# Patient Record
Sex: Female | Born: 1965 | Race: White | Hispanic: No | Marital: Single | Smoking: Former smoker
Health system: Southern US, Community
[De-identification: ages and names within clinical notes are randomized; demographics above are authoritative.]

## PROBLEM LIST (undated history)

## (undated) DIAGNOSIS — K219 Gastro-esophageal reflux disease without esophagitis: Secondary | ICD-10-CM

## (undated) DIAGNOSIS — G894 Chronic pain syndrome: Secondary | ICD-10-CM

## (undated) DIAGNOSIS — F419 Anxiety disorder, unspecified: Secondary | ICD-10-CM

## (undated) DIAGNOSIS — R1013 Epigastric pain: Secondary | ICD-10-CM

## (undated) DIAGNOSIS — F329 Major depressive disorder, single episode, unspecified: Secondary | ICD-10-CM

## (undated) DIAGNOSIS — I10 Essential (primary) hypertension: Secondary | ICD-10-CM

## (undated) DIAGNOSIS — J449 Chronic obstructive pulmonary disease, unspecified: Secondary | ICD-10-CM

## (undated) DIAGNOSIS — IMO0001 Reserved for inherently not codable concepts without codable children: Secondary | ICD-10-CM

## (undated) DIAGNOSIS — R5383 Other fatigue: Secondary | ICD-10-CM

## (undated) DIAGNOSIS — M797 Fibromyalgia: Secondary | ICD-10-CM

## (undated) DIAGNOSIS — F32A Depression, unspecified: Secondary | ICD-10-CM

## (undated) DIAGNOSIS — G4730 Sleep apnea, unspecified: Secondary | ICD-10-CM

## (undated) HISTORY — PX: ENDOMETRIAL ABLATION: SHX621

## (undated) HISTORY — DX: Other fatigue: R53.83

## (undated) HISTORY — PX: CHOLECYSTECTOMY: SHX55

## (undated) HISTORY — PX: CARPAL TUNNEL RELEASE: SHX101

## (undated) HISTORY — DX: Epigastric pain: R10.13

## (undated) HISTORY — PX: SPINE SURGERY: SHX786

## (undated) HISTORY — PX: KNEE SURGERY: SHX244

## (undated) HISTORY — PX: OTHER SURGICAL HISTORY: SHX169

---

## 1998-04-20 ENCOUNTER — Other Ambulatory Visit: Admission: RE | Admit: 1998-04-20 | Discharge: 1998-04-20 | Payer: Self-pay | Admitting: Obstetrics and Gynecology

## 1999-05-10 ENCOUNTER — Other Ambulatory Visit: Admission: RE | Admit: 1999-05-10 | Discharge: 1999-05-10 | Payer: Self-pay | Admitting: Obstetrics and Gynecology

## 2001-11-02 ENCOUNTER — Inpatient Hospital Stay (HOSPITAL_COMMUNITY): Admission: EM | Admit: 2001-11-02 | Discharge: 2001-11-05 | Payer: Self-pay | Admitting: Emergency Medicine

## 2001-11-02 ENCOUNTER — Encounter: Payer: Self-pay | Admitting: Emergency Medicine

## 2001-11-03 ENCOUNTER — Encounter: Payer: Self-pay | Admitting: Internal Medicine

## 2003-01-27 ENCOUNTER — Emergency Department (HOSPITAL_COMMUNITY): Admission: EM | Admit: 2003-01-27 | Discharge: 2003-01-27 | Payer: Self-pay | Admitting: Emergency Medicine

## 2003-03-10 ENCOUNTER — Emergency Department (HOSPITAL_COMMUNITY): Admission: EM | Admit: 2003-03-10 | Discharge: 2003-03-10 | Payer: Self-pay | Admitting: Emergency Medicine

## 2003-03-10 ENCOUNTER — Encounter: Payer: Self-pay | Admitting: Emergency Medicine

## 2003-03-19 ENCOUNTER — Observation Stay (HOSPITAL_COMMUNITY): Admission: AD | Admit: 2003-03-19 | Discharge: 2003-03-20 | Payer: Self-pay | Admitting: Obstetrics and Gynecology

## 2003-05-16 ENCOUNTER — Observation Stay (HOSPITAL_COMMUNITY): Admission: AD | Admit: 2003-05-16 | Discharge: 2003-05-17 | Payer: Self-pay | Admitting: Obstetrics & Gynecology

## 2003-05-17 ENCOUNTER — Ambulatory Visit (HOSPITAL_COMMUNITY): Admission: AD | Admit: 2003-05-17 | Discharge: 2003-05-17 | Payer: Self-pay | Admitting: Obstetrics & Gynecology

## 2003-05-26 ENCOUNTER — Observation Stay (HOSPITAL_COMMUNITY): Admission: RE | Admit: 2003-05-26 | Discharge: 2003-05-26 | Payer: Self-pay | Admitting: Obstetrics & Gynecology

## 2003-08-03 ENCOUNTER — Inpatient Hospital Stay (HOSPITAL_COMMUNITY): Admission: AD | Admit: 2003-08-03 | Discharge: 2003-08-04 | Payer: Self-pay | Admitting: Obstetrics and Gynecology

## 2003-11-17 ENCOUNTER — Inpatient Hospital Stay (HOSPITAL_COMMUNITY): Admission: RE | Admit: 2003-11-17 | Discharge: 2003-11-18 | Payer: Self-pay | Admitting: General Surgery

## 2006-04-21 ENCOUNTER — Emergency Department (HOSPITAL_COMMUNITY): Admission: EM | Admit: 2006-04-21 | Discharge: 2006-04-21 | Payer: Self-pay | Admitting: Emergency Medicine

## 2006-07-16 ENCOUNTER — Emergency Department (HOSPITAL_COMMUNITY): Admission: EM | Admit: 2006-07-16 | Discharge: 2006-07-16 | Payer: Self-pay | Admitting: Emergency Medicine

## 2007-04-09 ENCOUNTER — Emergency Department (HOSPITAL_COMMUNITY): Admission: EM | Admit: 2007-04-09 | Discharge: 2007-04-09 | Payer: Self-pay | Admitting: Emergency Medicine

## 2007-04-10 ENCOUNTER — Other Ambulatory Visit: Admission: RE | Admit: 2007-04-10 | Discharge: 2007-04-10 | Payer: Self-pay | Admitting: Obstetrics and Gynecology

## 2007-04-10 ENCOUNTER — Emergency Department (HOSPITAL_COMMUNITY): Admission: EM | Admit: 2007-04-10 | Discharge: 2007-04-10 | Payer: Self-pay | Admitting: Emergency Medicine

## 2007-05-19 ENCOUNTER — Ambulatory Visit (HOSPITAL_COMMUNITY): Admission: RE | Admit: 2007-05-19 | Discharge: 2007-05-19 | Payer: Self-pay | Admitting: Obstetrics & Gynecology

## 2007-05-25 ENCOUNTER — Encounter: Admission: RE | Admit: 2007-05-25 | Discharge: 2007-05-25 | Payer: Self-pay | Admitting: Obstetrics & Gynecology

## 2007-05-25 ENCOUNTER — Ambulatory Visit (HOSPITAL_COMMUNITY): Admission: RE | Admit: 2007-05-25 | Discharge: 2007-05-25 | Payer: Self-pay | Admitting: Obstetrics & Gynecology

## 2007-06-05 ENCOUNTER — Inpatient Hospital Stay (HOSPITAL_COMMUNITY): Admission: AD | Admit: 2007-06-05 | Discharge: 2007-06-05 | Payer: Self-pay | Admitting: Obstetrics & Gynecology

## 2007-06-08 ENCOUNTER — Ambulatory Visit (HOSPITAL_COMMUNITY): Admission: RE | Admit: 2007-06-08 | Discharge: 2007-06-08 | Payer: Self-pay | Admitting: Obstetrics & Gynecology

## 2007-06-16 ENCOUNTER — Ambulatory Visit (HOSPITAL_COMMUNITY): Admission: RE | Admit: 2007-06-16 | Discharge: 2007-06-16 | Payer: Self-pay | Admitting: Obstetrics & Gynecology

## 2007-06-27 ENCOUNTER — Emergency Department (HOSPITAL_COMMUNITY): Admission: EM | Admit: 2007-06-27 | Discharge: 2007-06-27 | Payer: Self-pay | Admitting: Emergency Medicine

## 2007-06-29 ENCOUNTER — Emergency Department (HOSPITAL_COMMUNITY): Admission: EM | Admit: 2007-06-29 | Discharge: 2007-06-29 | Payer: Self-pay | Admitting: Emergency Medicine

## 2007-07-01 ENCOUNTER — Ambulatory Visit (HOSPITAL_COMMUNITY): Admission: RE | Admit: 2007-07-01 | Discharge: 2007-07-01 | Payer: Self-pay | Admitting: Obstetrics & Gynecology

## 2007-07-21 ENCOUNTER — Inpatient Hospital Stay (HOSPITAL_COMMUNITY): Admission: AD | Admit: 2007-07-21 | Discharge: 2007-07-21 | Payer: Self-pay | Admitting: Family Medicine

## 2007-07-21 ENCOUNTER — Ambulatory Visit: Payer: Self-pay | Admitting: *Deleted

## 2007-07-22 ENCOUNTER — Ambulatory Visit: Payer: Self-pay | Admitting: Cardiology

## 2007-07-27 ENCOUNTER — Ambulatory Visit: Payer: Self-pay | Admitting: Family Medicine

## 2007-07-29 ENCOUNTER — Ambulatory Visit: Payer: Self-pay

## 2007-07-29 ENCOUNTER — Encounter: Payer: Self-pay | Admitting: Cardiology

## 2007-07-29 ENCOUNTER — Ambulatory Visit (HOSPITAL_COMMUNITY): Admission: RE | Admit: 2007-07-29 | Discharge: 2007-07-29 | Payer: Self-pay | Admitting: Obstetrics & Gynecology

## 2007-08-03 ENCOUNTER — Ambulatory Visit: Payer: Self-pay | Admitting: Obstetrics & Gynecology

## 2007-08-17 ENCOUNTER — Ambulatory Visit: Payer: Self-pay | Admitting: *Deleted

## 2007-08-26 ENCOUNTER — Ambulatory Visit (HOSPITAL_COMMUNITY): Admission: RE | Admit: 2007-08-26 | Discharge: 2007-08-26 | Payer: Self-pay | Admitting: Obstetrics & Gynecology

## 2007-08-31 ENCOUNTER — Ambulatory Visit: Payer: Self-pay | Admitting: Obstetrics & Gynecology

## 2007-09-03 ENCOUNTER — Ambulatory Visit: Payer: Self-pay | Admitting: *Deleted

## 2007-09-03 ENCOUNTER — Inpatient Hospital Stay (HOSPITAL_COMMUNITY): Admission: AD | Admit: 2007-09-03 | Discharge: 2007-09-03 | Payer: Self-pay | Admitting: Obstetrics & Gynecology

## 2007-09-07 ENCOUNTER — Ambulatory Visit: Payer: Self-pay | Admitting: Obstetrics & Gynecology

## 2007-09-07 ENCOUNTER — Ambulatory Visit (HOSPITAL_COMMUNITY): Admission: RE | Admit: 2007-09-07 | Discharge: 2007-09-07 | Payer: Self-pay | Admitting: Obstetrics & Gynecology

## 2007-09-21 ENCOUNTER — Ambulatory Visit: Payer: Self-pay | Admitting: Family Medicine

## 2007-09-23 ENCOUNTER — Ambulatory Visit (HOSPITAL_COMMUNITY): Admission: RE | Admit: 2007-09-23 | Discharge: 2007-09-23 | Payer: Self-pay | Admitting: Obstetrics & Gynecology

## 2007-09-23 ENCOUNTER — Ambulatory Visit: Payer: Self-pay | Admitting: Obstetrics & Gynecology

## 2007-09-28 ENCOUNTER — Encounter: Admission: RE | Admit: 2007-09-28 | Discharge: 2007-11-09 | Payer: Self-pay | Admitting: Obstetrics & Gynecology

## 2007-09-28 ENCOUNTER — Ambulatory Visit: Payer: Self-pay | Admitting: Obstetrics & Gynecology

## 2007-10-05 ENCOUNTER — Ambulatory Visit: Payer: Self-pay | Admitting: Obstetrics & Gynecology

## 2007-10-12 ENCOUNTER — Ambulatory Visit: Payer: Self-pay | Admitting: Obstetrics & Gynecology

## 2007-10-15 ENCOUNTER — Ambulatory Visit: Payer: Self-pay | Admitting: Obstetrics & Gynecology

## 2007-10-19 ENCOUNTER — Ambulatory Visit: Payer: Self-pay | Admitting: Obstetrics & Gynecology

## 2007-10-21 ENCOUNTER — Ambulatory Visit (HOSPITAL_COMMUNITY): Admission: RE | Admit: 2007-10-21 | Discharge: 2007-10-21 | Payer: Self-pay | Admitting: Obstetrics & Gynecology

## 2007-10-26 ENCOUNTER — Ambulatory Visit: Payer: Self-pay | Admitting: Obstetrics & Gynecology

## 2007-10-29 ENCOUNTER — Ambulatory Visit: Payer: Self-pay | Admitting: Family Medicine

## 2007-11-02 ENCOUNTER — Inpatient Hospital Stay (HOSPITAL_COMMUNITY): Admission: AD | Admit: 2007-11-02 | Discharge: 2007-11-02 | Payer: Self-pay | Admitting: Obstetrics & Gynecology

## 2007-11-02 ENCOUNTER — Ambulatory Visit: Payer: Self-pay | Admitting: Obstetrics & Gynecology

## 2007-11-05 ENCOUNTER — Ambulatory Visit: Payer: Self-pay | Admitting: Obstetrics & Gynecology

## 2007-11-09 ENCOUNTER — Ambulatory Visit: Payer: Self-pay | Admitting: Obstetrics & Gynecology

## 2007-11-10 ENCOUNTER — Inpatient Hospital Stay (HOSPITAL_COMMUNITY): Admission: AD | Admit: 2007-11-10 | Discharge: 2007-11-14 | Payer: Self-pay | Admitting: Gynecology

## 2007-11-10 ENCOUNTER — Ambulatory Visit: Payer: Self-pay | Admitting: Gynecology

## 2008-01-27 ENCOUNTER — Ambulatory Visit (HOSPITAL_COMMUNITY): Admission: RE | Admit: 2008-01-27 | Discharge: 2008-01-27 | Payer: Self-pay | Admitting: Family Medicine

## 2008-02-01 ENCOUNTER — Ambulatory Visit: Payer: Self-pay | Admitting: Obstetrics and Gynecology

## 2008-05-01 ENCOUNTER — Emergency Department (HOSPITAL_COMMUNITY): Admission: EM | Admit: 2008-05-01 | Discharge: 2008-05-02 | Payer: Self-pay | Admitting: Emergency Medicine

## 2008-05-03 ENCOUNTER — Inpatient Hospital Stay (HOSPITAL_COMMUNITY): Admission: EM | Admit: 2008-05-03 | Discharge: 2008-05-05 | Payer: Self-pay | Admitting: Emergency Medicine

## 2008-06-16 ENCOUNTER — Other Ambulatory Visit: Admission: RE | Admit: 2008-06-16 | Discharge: 2008-06-16 | Payer: Self-pay | Admitting: Obstetrics and Gynecology

## 2008-07-03 ENCOUNTER — Emergency Department (HOSPITAL_COMMUNITY): Admission: EM | Admit: 2008-07-03 | Discharge: 2008-07-03 | Payer: Self-pay | Admitting: Emergency Medicine

## 2008-08-03 ENCOUNTER — Ambulatory Visit (HOSPITAL_COMMUNITY): Admission: RE | Admit: 2008-08-03 | Discharge: 2008-08-03 | Payer: Self-pay | Admitting: Obstetrics and Gynecology

## 2008-10-10 ENCOUNTER — Ambulatory Visit (HOSPITAL_COMMUNITY): Admission: RE | Admit: 2008-10-10 | Discharge: 2008-10-10 | Payer: Self-pay | Admitting: Obstetrics and Gynecology

## 2008-10-25 ENCOUNTER — Emergency Department (HOSPITAL_COMMUNITY): Admission: EM | Admit: 2008-10-25 | Discharge: 2008-10-25 | Payer: Self-pay | Admitting: Emergency Medicine

## 2009-03-02 ENCOUNTER — Ambulatory Visit (HOSPITAL_COMMUNITY): Admission: RE | Admit: 2009-03-02 | Discharge: 2009-03-02 | Payer: Self-pay | Admitting: Obstetrics & Gynecology

## 2009-11-17 ENCOUNTER — Emergency Department (HOSPITAL_COMMUNITY): Admission: EM | Admit: 2009-11-17 | Discharge: 2009-11-17 | Payer: Self-pay | Admitting: Emergency Medicine

## 2009-11-20 ENCOUNTER — Emergency Department (HOSPITAL_COMMUNITY): Admission: EM | Admit: 2009-11-20 | Discharge: 2009-11-20 | Payer: Self-pay | Admitting: Emergency Medicine

## 2009-11-29 ENCOUNTER — Emergency Department (HOSPITAL_COMMUNITY): Admission: EM | Admit: 2009-11-29 | Discharge: 2009-11-29 | Payer: Self-pay | Admitting: Emergency Medicine

## 2009-12-13 ENCOUNTER — Encounter (HOSPITAL_COMMUNITY): Admission: RE | Admit: 2009-12-13 | Discharge: 2010-01-12 | Payer: Self-pay | Admitting: Family Medicine

## 2010-01-01 ENCOUNTER — Ambulatory Visit (HOSPITAL_COMMUNITY): Admission: RE | Admit: 2010-01-01 | Discharge: 2010-01-01 | Payer: Self-pay | Admitting: Neurosurgery

## 2010-01-30 ENCOUNTER — Encounter (HOSPITAL_COMMUNITY): Admission: RE | Admit: 2010-01-30 | Discharge: 2010-03-01 | Payer: Self-pay | Admitting: Family Medicine

## 2010-03-13 ENCOUNTER — Emergency Department (HOSPITAL_COMMUNITY): Admission: EM | Admit: 2010-03-13 | Discharge: 2010-03-13 | Payer: Self-pay | Admitting: Emergency Medicine

## 2010-03-26 ENCOUNTER — Emergency Department (HOSPITAL_COMMUNITY): Admission: EM | Admit: 2010-03-26 | Discharge: 2010-03-26 | Payer: Self-pay | Admitting: Emergency Medicine

## 2010-08-30 ENCOUNTER — Emergency Department (HOSPITAL_COMMUNITY)
Admission: EM | Admit: 2010-08-30 | Discharge: 2010-08-30 | Disposition: A | Discharge status: Home / Self Care | Payer: Medicaid Other | Attending: Emergency Medicine | Admitting: Emergency Medicine

## 2010-08-30 ENCOUNTER — Emergency Department (HOSPITAL_COMMUNITY): Payer: Medicaid Other

## 2010-08-30 DIAGNOSIS — R5383 Other fatigue: Secondary | ICD-10-CM | POA: Insufficient documentation

## 2010-08-30 DIAGNOSIS — M549 Dorsalgia, unspecified: Secondary | ICD-10-CM | POA: Insufficient documentation

## 2010-08-30 DIAGNOSIS — Z79899 Other long term (current) drug therapy: Secondary | ICD-10-CM | POA: Insufficient documentation

## 2010-08-30 DIAGNOSIS — R5381 Other malaise: Secondary | ICD-10-CM | POA: Insufficient documentation

## 2010-08-30 DIAGNOSIS — G8929 Other chronic pain: Secondary | ICD-10-CM | POA: Insufficient documentation

## 2010-08-30 DIAGNOSIS — J45901 Unspecified asthma with (acute) exacerbation: Secondary | ICD-10-CM | POA: Insufficient documentation

## 2010-08-30 DIAGNOSIS — R0602 Shortness of breath: Secondary | ICD-10-CM | POA: Insufficient documentation

## 2010-08-30 LAB — URINALYSIS, ROUTINE W REFLEX MICROSCOPIC
Bilirubin Urine: NEGATIVE
Hgb urine dipstick: NEGATIVE
Ketones, ur: NEGATIVE mg/dL
Protein, ur: NEGATIVE mg/dL
Urine Glucose, Fasting: NEGATIVE mg/dL
Urobilinogen, UA: 0.2 mg/dL (ref 0.0–1.0)

## 2010-08-30 LAB — COMPREHENSIVE METABOLIC PANEL
ALT: 21 U/L (ref 0–35)
AST: 23 U/L (ref 0–37)
Albumin: 4.1 g/dL (ref 3.5–5.2)
Alkaline Phosphatase: 71 U/L (ref 39–117)
Chloride: 105 mEq/L (ref 96–112)
Potassium: 3.7 mEq/L (ref 3.5–5.1)
Sodium: 136 mEq/L (ref 135–145)
Total Bilirubin: 0.8 mg/dL (ref 0.3–1.2)
Total Protein: 7.6 g/dL (ref 6.0–8.3)

## 2010-08-30 LAB — CBC
MCV: 96.6 fL (ref 78.0–100.0)
Platelets: 301 10*3/uL (ref 150–400)
RBC: 4.45 MIL/uL (ref 3.87–5.11)
RDW: 12.8 % (ref 11.5–15.5)
WBC: 8.4 10*3/uL (ref 4.0–10.5)

## 2010-08-30 LAB — DIFFERENTIAL
Basophils Absolute: 0.1 10*3/uL (ref 0.0–0.1)
Eosinophils Absolute: 0.3 10*3/uL (ref 0.0–0.7)
Eosinophils Relative: 3 % (ref 0–5)
Lymphocytes Relative: 27 % (ref 12–46)
Lymphs Abs: 2.3 10*3/uL (ref 0.7–4.0)
Neutrophils Relative %: 62 % (ref 43–77)

## 2010-08-30 LAB — GLUCOSE, CAPILLARY

## 2010-10-24 LAB — CBC
HCT: 40.9 % (ref 36.0–46.0)
MCHC: 34.9 g/dL (ref 30.0–36.0)
MCV: 97.6 fL (ref 78.0–100.0)
Platelets: 287 10*3/uL (ref 150–400)
WBC: 16.7 10*3/uL — ABNORMAL HIGH (ref 4.0–10.5)

## 2010-10-24 LAB — BASIC METABOLIC PANEL
BUN: 12 mg/dL (ref 6–23)
CO2: 23 mEq/L (ref 19–32)
Chloride: 111 mEq/L (ref 96–112)
Creatinine, Ser: 0.8 mg/dL (ref 0.4–1.2)
Glucose, Bld: 103 mg/dL — ABNORMAL HIGH (ref 70–99)
Potassium: 3.4 mEq/L — ABNORMAL LOW (ref 3.5–5.1)

## 2010-10-24 LAB — ETHANOL: Alcohol, Ethyl (B): 270 mg/dL — ABNORMAL HIGH (ref 0–10)

## 2010-10-24 LAB — D-DIMER, QUANTITATIVE: D-Dimer, Quant: 1.39 ug/mL-FEU — ABNORMAL HIGH (ref 0.00–0.48)

## 2010-10-24 LAB — DIFFERENTIAL
Basophils Relative: 0 % (ref 0–1)
Eosinophils Absolute: 0.7 10*3/uL (ref 0.0–0.7)
Eosinophils Relative: 4 % (ref 0–5)
Lymphs Abs: 3.1 10*3/uL (ref 0.7–4.0)
Monocytes Relative: 5 % (ref 3–12)

## 2010-10-24 LAB — URINALYSIS, ROUTINE W REFLEX MICROSCOPIC
Glucose, UA: NEGATIVE mg/dL
Leukocytes, UA: NEGATIVE
Nitrite: NEGATIVE
Specific Gravity, Urine: 1.02 (ref 1.005–1.030)
pH: 5.5 (ref 5.0–8.0)

## 2010-10-24 LAB — POCT CARDIAC MARKERS: Troponin i, poc: <0.05 ng/mL (ref 0.00–0.09)

## 2010-10-24 LAB — URINE MICROSCOPIC-ADD ON

## 2010-10-25 LAB — CBC
HCT: 42.5 % (ref 36.0–46.0)
Hemoglobin: 14.7 g/dL (ref 12.0–15.0)
MCHC: 34.7 g/dL (ref 30.0–36.0)
MCV: 97.7 fL (ref 78.0–100.0)
RBC: 4.35 MIL/uL (ref 3.87–5.11)

## 2010-10-25 LAB — BASIC METABOLIC PANEL
CO2: 25 mEq/L (ref 19–32)
Chloride: 105 mEq/L (ref 96–112)
GFR calc Af Amer: >60 mL/min (ref >60)
Potassium: 3.5 mEq/L (ref 3.5–5.1)
Sodium: 137 mEq/L (ref 135–145)

## 2010-10-30 ENCOUNTER — Emergency Department (HOSPITAL_COMMUNITY): Payer: Medicaid Other

## 2010-10-30 ENCOUNTER — Inpatient Hospital Stay (HOSPITAL_COMMUNITY)
Admission: EM | Admit: 2010-10-30 | Discharge: 2010-11-05 | DRG: 190 | Disposition: A | Discharge status: Home / Self Care | Payer: Medicaid Other | Attending: Internal Medicine | Admitting: Internal Medicine

## 2010-10-30 DIAGNOSIS — K5900 Constipation, unspecified: Secondary | ICD-10-CM | POA: Diagnosis present

## 2010-10-30 DIAGNOSIS — R1310 Dysphagia, unspecified: Secondary | ICD-10-CM | POA: Diagnosis present

## 2010-10-30 DIAGNOSIS — T380X5A Adverse effect of glucocorticoids and synthetic analogues, initial encounter: Secondary | ICD-10-CM | POA: Diagnosis present

## 2010-10-30 DIAGNOSIS — F411 Generalized anxiety disorder: Secondary | ICD-10-CM | POA: Diagnosis present

## 2010-10-30 DIAGNOSIS — G894 Chronic pain syndrome: Secondary | ICD-10-CM | POA: Diagnosis present

## 2010-10-30 DIAGNOSIS — J209 Acute bronchitis, unspecified: Principal | ICD-10-CM | POA: Diagnosis present

## 2010-10-30 DIAGNOSIS — D72829 Elevated white blood cell count, unspecified: Secondary | ICD-10-CM | POA: Diagnosis not present

## 2010-10-30 DIAGNOSIS — R7309 Other abnormal glucose: Secondary | ICD-10-CM | POA: Diagnosis present

## 2010-10-30 DIAGNOSIS — J440 Chronic obstructive pulmonary disease with acute lower respiratory infection: Principal | ICD-10-CM | POA: Diagnosis present

## 2010-10-30 DIAGNOSIS — K2960 Other gastritis without bleeding: Secondary | ICD-10-CM | POA: Diagnosis present

## 2010-10-30 DIAGNOSIS — K2100 Gastro-esophageal reflux disease with esophagitis, without bleeding: Secondary | ICD-10-CM | POA: Diagnosis present

## 2010-10-30 DIAGNOSIS — Q391 Atresia of esophagus with tracheo-esophageal fistula: Secondary | ICD-10-CM

## 2010-10-30 DIAGNOSIS — I10 Essential (primary) hypertension: Secondary | ICD-10-CM | POA: Diagnosis present

## 2010-10-30 DIAGNOSIS — F17200 Nicotine dependence, unspecified, uncomplicated: Secondary | ICD-10-CM | POA: Diagnosis present

## 2010-10-30 LAB — POCT I-STAT, CHEM 8
BUN: 4 mg/dL — ABNORMAL LOW (ref 6–23)
Calcium, Ion: 1.05 mmol/L — ABNORMAL LOW (ref 1.12–1.32)
Creatinine, Ser: 0.7 mg/dL (ref 0.4–1.2)
Sodium: 137 mEq/L (ref 135–145)
TCO2: 22 mmol/L (ref 0–100)

## 2010-10-30 LAB — DIFFERENTIAL
Basophils Relative: 0 % (ref 0–1)
Monocytes Absolute: 0.2 10*3/uL (ref 0.1–1.0)
Monocytes Relative: 2 % — ABNORMAL LOW (ref 3–12)
Neutro Abs: 8 10*3/uL — ABNORMAL HIGH (ref 1.7–7.7)

## 2010-10-30 LAB — CBC
Hemoglobin: 13.3 g/dL (ref 12.0–15.0)
MCH: 32.7 pg (ref 26.0–34.0)
MCHC: 32.9 g/dL (ref 30.0–36.0)

## 2010-10-31 LAB — CBC
MCH: 32.4 pg (ref 26.0–34.0)
MCHC: 32.5 g/dL (ref 30.0–36.0)
MCV: 99.7 fL (ref 78.0–100.0)
Platelets: 219 10*3/uL (ref 150–400)
RDW: 13 % (ref 11.5–15.5)

## 2010-10-31 LAB — INFLUENZA PANEL BY PCR (TYPE A & B): Influenza B By PCR: NEGATIVE

## 2010-10-31 LAB — DIFFERENTIAL
Eosinophils Absolute: 0 10*3/uL (ref 0.0–0.7)
Eosinophils Relative: 0 % (ref 0–5)
Lymphs Abs: 0.4 10*3/uL — ABNORMAL LOW (ref 0.7–4.0)
Monocytes Absolute: 0.1 10*3/uL (ref 0.1–1.0)
Monocytes Relative: 1 % — ABNORMAL LOW (ref 3–12)

## 2010-10-31 LAB — BASIC METABOLIC PANEL
BUN: 4 mg/dL — ABNORMAL LOW (ref 6–23)
CO2: 23 mEq/L (ref 19–32)
Chloride: 106 mEq/L (ref 96–112)
Creatinine, Ser: 0.59 mg/dL (ref 0.4–1.2)
Potassium: 3.5 mEq/L (ref 3.5–5.1)

## 2010-10-31 LAB — GLUCOSE, CAPILLARY
Glucose-Capillary: 143 mg/dL — ABNORMAL HIGH (ref 70–99)
Glucose-Capillary: 172 mg/dL — ABNORMAL HIGH (ref 70–99)

## 2010-10-31 LAB — MAGNESIUM: Magnesium: 2 mg/dL (ref 1.5–2.5)

## 2010-11-01 LAB — GLUCOSE, CAPILLARY
Glucose-Capillary: 143 mg/dL — ABNORMAL HIGH (ref 70–99)
Glucose-Capillary: 161 mg/dL — ABNORMAL HIGH (ref 70–99)

## 2010-11-01 NOTE — H&P (Signed)
[name], [name] NO.:  [id number]  MEDICAL RECORD NO.:  [id number]           PATIENT TYPE:  E  LOCATION:  [facility]                          FACILITY:  [facility]  PHYSICIAN:  [name], M.D. DATE OF BIRTH:  1966-01-07  DATE OF ADMISSION:  10/30/2010 DATE OF DISCHARGE:  LH                             HISTORY & PHYSICAL   PRIMARY CARE PHYSICIAN:  Christine Rear. Fusco, MD  CHIEF COMPLAINT:  Progressive shortness of breath for 1 week.  HISTORY OF PRESENT ILLNESS:  This is a 45 year old lady with a history of asthma with a moderate frequency of attacks through the year, usually controlled with home albuterol via nebulizer, however, for the past week she has been having runny nose, cough, and a postnasal drip associated with wheezing with poor responsiveness to her home nebulizer.  The patient has not noticed any fever but her cough is productive of thick yellow sputum.  The patient has been coughing so much she is now having pains in the lower ribs on both sides.  As noted, no fever, no central chest pain, no nausea, no vomiting, and no lower extremity edema.  The patient has not used any antibiotics during the course of this exacerbation.  PAST MEDICAL HISTORY: 1. Asthma. 2. Anxiety. 3. Chronic back pain. 4. Hypertension.  MEDICATIONS: 1. Albuterol by home nebulizer p.r.n. 2. Labetalol 200 mg twice daily. 3. Amlodipine 5 mg daily. 4. Diazepam 10 mg 3 times daily when necessary.  ALLERGIES:  No known drug allergies.  SOCIAL HISTORY:  Smokes about a third-pack of cigarettes per day.  She used to smoke 1-pack per day but has been cutting down.  She is not smoking for over 20 years.  She denies alcohol or illicit drug use.  She is unemployed.  FAMILY HISTORY:  Significant for diabetes, hypertension, and coronary artery disease.  REVIEW OF SYSTEMS:  Other than noted above unremarkable.  PHYSICAL EXAMINATION:  GENERAL:  A very pleasant, mildly obese  middle- aged Caucasian lady reclining in the stretcher, looks uncomfortable, very short of breath. VITAL SIGNS:  Temperature is 98.6, pulse 111, blood pressure 111/72, pulse 77, respirations 22, and saturating at 96% on room air. HEENT:  Pupils are round and equal.  Mucous membranes are pink. Anicteric. NECK:  No cervical lymphadenopathy or thyromegaly.  No carotid bruit. CHEST:  She has diffuse rhonchi and wheezes bilaterally.  No crackles. CARDIOVASCULAR:  Regular rhythm.  No murmur. ABDOMEN:  She is tender over the lower ribs bilaterally.  Her abdomen is mildly obese, soft, and nontender. EXTREMITIES:  Without edema.  Muscle tone and bulk are normal. CENTRAL NERVOUS SYSTEM:  Cranial nerves II-XII are grossly intact.  She has no focal lateralizing signs.  LABORATORY DATA:  Her white count is 9.0, hemoglobin 13.3, and platelets 198.  She has 89% neutrophils, absolute neutrophil count 8.0.  Her sodium is 137, potassium 3.7, chloride 106, glucose 137, BUN 4, and creatinine 0.7.  Her chest x-ray shows hyperinflated lungs, peribronchial thickening, question of asthma versus COPD, and no acute infiltrate.  ASSESSMENT: 1. Acute exacerbation of asthma and bronchitis. 2. Tobacco abuse. 3. Upper  respiratory infection. 4. Hypertension. 5. Anxiety. 6. Chronic back pain.  PLAN: 1. We will bring this lady onto a med/surg bed for respiratory     support.  Serial nebulizations, steroids, and intravenous fluids. 2. We will cover her with Zithromax and Flonase nasal spray because of     the sinusitis component.  We will give p.r.n. medications and     continue her benzodiazepines p.r.n. 3. I will counsel her on tobacco sensation and will be giving her a     nicotine patch. 4. Because of her steroids, we will be covering her with a proton pump     inhibitor. 5. Other plans as per orders.     [name], M.D.     LC/MEDQ  D:  10/30/2010  T:  10/30/2010  Job:  [id number]  cc:    Christine Rear. Sherwood Gambler, MD Fax: [phone]  Electronically Signed by [name] M.D. on 11/01/2010 05:04:52 AM

## 2010-11-02 DIAGNOSIS — K2960 Other gastritis without bleeding: Secondary | ICD-10-CM

## 2010-11-02 DIAGNOSIS — K449 Diaphragmatic hernia without obstruction or gangrene: Secondary | ICD-10-CM

## 2010-11-02 DIAGNOSIS — K208 Other esophagitis: Secondary | ICD-10-CM

## 2010-11-02 DIAGNOSIS — Q393 Congenital stenosis and stricture of esophagus: Secondary | ICD-10-CM

## 2010-11-02 DIAGNOSIS — Q391 Atresia of esophagus with tracheo-esophageal fistula: Secondary | ICD-10-CM

## 2010-11-02 DIAGNOSIS — R1310 Dysphagia, unspecified: Secondary | ICD-10-CM

## 2010-11-02 DIAGNOSIS — K219 Gastro-esophageal reflux disease without esophagitis: Secondary | ICD-10-CM

## 2010-11-02 LAB — GLUCOSE, CAPILLARY
Glucose-Capillary: 119 mg/dL — ABNORMAL HIGH (ref 70–99)
Glucose-Capillary: 93 mg/dL (ref 70–99)

## 2010-11-04 ENCOUNTER — Inpatient Hospital Stay (HOSPITAL_COMMUNITY): Payer: Medicaid Other

## 2010-11-04 LAB — COMPREHENSIVE METABOLIC PANEL
ALT: 27 U/L (ref 0–35)
AST: 18 U/L (ref 0–37)
Albumin: 3.5 g/dL (ref 3.5–5.2)
Alkaline Phosphatase: 69 U/L (ref 39–117)
Calcium: 8.9 mg/dL (ref 8.4–10.5)
GFR calc Af Amer: >60 mL/min (ref >60)
Potassium: 3.7 mEq/L (ref 3.5–5.1)
Sodium: 135 mEq/L (ref 135–145)
Total Protein: 6.7 g/dL (ref 6.0–8.3)

## 2010-11-04 LAB — DIFFERENTIAL
Basophils Absolute: 0 10*3/uL (ref 0.0–0.1)
Basophils Relative: 0 % (ref 0–1)
Eosinophils Relative: 5 % (ref 0–5)
Lymphocytes Relative: 43 % (ref 12–46)
Monocytes Absolute: 0.6 10*3/uL (ref 0.1–1.0)
Neutro Abs: 3.3 10*3/uL (ref 1.7–7.7)

## 2010-11-04 LAB — CBC
HCT: 41.1 % (ref 36.0–46.0)
MCHC: 33.3 g/dL (ref 30.0–36.0)
RDW: 12.6 % (ref 11.5–15.5)
WBC: 7.6 10*3/uL (ref 4.0–10.5)

## 2010-11-05 LAB — BASIC METABOLIC PANEL
BUN: 11 mg/dL (ref 6–23)
Calcium: 9.4 mg/dL (ref 8.4–10.5)
Chloride: 99 mEq/L (ref 96–112)
Creatinine, Ser: 0.7 mg/dL (ref 0.4–1.2)

## 2010-11-05 LAB — CBC
MCH: 32.3 pg (ref 26.0–34.0)
MCHC: 33.3 g/dL (ref 30.0–36.0)
MCV: 97 fL (ref 78.0–100.0)
Platelets: 265 10*3/uL (ref 150–400)
RBC: 4.36 MIL/uL (ref 3.87–5.11)

## 2010-11-05 LAB — H. PYLORI ANTIBODY, IGG: H Pylori IgG: 0.76 {ISR}

## 2010-11-05 LAB — DIFFERENTIAL
Basophils Relative: 0 % (ref 0–1)
Eosinophils Absolute: 0 10*3/uL (ref 0.0–0.7)
Eosinophils Relative: 0 % (ref 0–5)
Lymphs Abs: 0.8 10*3/uL (ref 0.7–4.0)
Monocytes Absolute: 0.6 10*3/uL (ref 0.1–1.0)
Monocytes Relative: 5 % (ref 3–12)

## 2010-11-05 NOTE — Discharge Summary (Signed)
[name], [name]              ACCOUNT NO.:  [id number]  MEDICAL RECORD NO.:  [id number]           PATIENT TYPE:  I  LOCATION:  A337                          FACILITY:  [facility]  PHYSICIAN:  Elliot Cousin, M.D.    DATE OF BIRTH:  13-Oct-1965  DATE OF ADMISSION:  10/30/2010 DATE OF DISCHARGE:  04/23/2012LH                              DISCHARGE SUMMARY   DISCHARGE DIAGNOSES: 1. Chronic obstructive pulmonary disease with acute bronchitic     exacerbation. 2. Dysphagia secondary to erosive reflux esophagitis/esophageal web     status post dilatation. 3. Erosive gastritis. 4. Tobacco abuse. 5. Mild hyperglycemia secondary to steroid treatment. 6. Leukocytosis secondary to steroid treatment. 7. Hypertension, well-controlled. 8. Anxiety, well controlled. 9. Chronic pain syndrome, well controlled. 10. Constipation.  DISCHARGE MEDICATIONS: 1. Albuterol nebulization 2.5/3 mL solution 4 times daily as needed     for shortness of breath and chest congestion. 2. Ceftin 250 mg b.i.d. for 3 more days. 3. Fluticasone 2 sprays nasally daily. 4. Prednisone 20 mg daily for 4 more days. 5. Protonix 40 mg b.i.d. with meals. 6. MiraLax laxative 17 grams daily for constipation. 7. Senokot-S 2 tablets daily as needed for constipation. 8. Albuterol inhaler 2 puffs every 6 hours as needed for shortness of     breath and chest congestion. 9. Amlodipine 5 mg daily. 10.Diazepam 10 mg 3 times daily as needed for anxiety and muscle     spasms. 11.Hydrocodone/APAP 10/325 mg every 6 hours as needed for pain. 12.Labetalol 200 mg b.i.d.  DISCHARGE DISPOSITION:  The patient was discharged to home in improved and stable condition on November 05, 2010.  She will follow up with her primary care physician Dr. Sherwood Gambler in 1-2 weeks and with gastroenterologist Dr. Karilyn Cota in approximately 3 weeks.  CONSULTATIONS:  Lionel December, MD  PROCEDURE PERFORMED: 1. EGD with esophageal dilatation on November 02, 2010 by Dr.  Lionel December.  The results revealed erosive reflux esophagitis with 2 cm     size sliding hiatal hernia, esophageal web disrupted by passing the     54-French dilator, and erosive antral gastritis. 2. Chest x-ray on November 04, 2010.  The results revealed no evidence of     acute cardiopulmonary disease.Marland Kitchen  HISTORY OF PRESENTING ILLNESS:  The patient is a 45 year old woman with a past medical history significant for asthmatic bronchitis, chronic back pain, hypertension, and chronic anxiety.  She presented to the emergency department on October 30, 2010 with a chief complaint of shortness of breath and chest congestion.  When she was initially evaluated, she was noted to be afebrile and hemodynamically stable.  She was oxygenating 96% on nasal cannula oxygen.  Her chest x-ray revealed hyperinflated lungs with peribronchial thickening but no acute infiltrates.  She was admitted for further evaluation and management.  HOSPITAL COURSE: 1. COPD WITH ACUTE BRONCHITIC EXACERBATION.  The patient was started     on intravenous Solu-Medrol, Rocephin, azithromycin, and     albuterol/Atrovent nebulizations.  Supportive treatment was started     with as-needed Robitussin and as-needed Tessalon Perles.  A     nicotine  patch was placed.  She was strongly advised to stop     smoking.  Tobacco cessation counseling was ordered.  Claritin and     Mucinex were initially added but were discontinued shortly after     hospital admission.  An influenza panel by PCR was ordered.  It was     negative.  Over the course of the hospitalization, the patient's     bronchospasms subsided.  She became less symptomatic.  She had no     complaints of shortness of breath or chest congestion at the time     of hospital discharge.  She was oxygenating 96% on room air.  She     received 5 days of azithromycin therapy.  She was discharged home     on 3 more days of Ceftin and a short prednisone taper.  Her white     blood  cell count was slightly elevated due to the steroid     treatment.  Her venous glucose was also slightly elevated due to     steroid treatment.  Her followup chest x-ray revealed no acute     cardiopulmonary disease. 2. EROSIVE ESOPHAGITIS, EROSIVE GASTRITIS, AND ESOPHAGEAL WEB.  The     patient complained of difficulty swallowing.  Proton pump inhibitor     therapy was started empirically.  Gastroenterologist Dr. Karilyn Cota was     consulted.  Following his initial assessment, he proceeded with an     upper endoscopy.  The results of the EGD revealed an esophageal web     that was dilated, erosive reflux esophagitis, and antral gastritis.     H. pylori serology was ordered but the results were pending at the     time of hospital discharge.  Per Dr. Patty Sermons recommendations, the     patient was discharged to home on b.i.d. dosing of Protonix which     he recommended that she stays on for at least 12 weeks.     The patient was so advised. 3. CONSTIPATION.  The patient had no significant bowel movements     during the hospitalization.  She was treated with a series of     laxatives, suppositories, and one enema.  She believed that thereason she did not have a bowel movement is because of the     hospital environment.  She feels certain that she will have a bowel     movement when she returns home.  She had no complaints of nausea,     vomiting, or abdominal pain.  She was advised to continue laxative     therapy at home.  All of the patient's other chronic conditions remained stable.     Elliot Cousin, M.D.     DF/MEDQ  D:  11/05/2010  T:  11/05/2010  Job:  [id number]  cc:   [name], MD Fax: [phone]  Lionel December, M.D. Fax: [phone]  Electronically Signed by Elliot Cousin M.D. on 11/05/2010 12:44:48 PM

## 2010-11-18 NOTE — Consult Note (Signed)
[name], [name]              ACCOUNT NO.:  [id number]  MEDICAL RECORD NO.:  [id number]           PATIENT TYPE:  I  LOCATION:  A337                          FACILITY:  [facility]  PHYSICIAN:  Lionel December, M.D.    DATE OF BIRTH:  Nov 21, 1965  DATE OF CONSULTATION: DATE OF DISCHARGE:                                CONSULTATION   REASON FOR CONSULTATION:  Dysphagia.  HISTORY OF PRESENT ILLNESS:  Ms. Comacho is a 45 year old female admitted yesterday with exacerbation of her asthma.  She had been short of breath 3 weeks prior to admission.  She is also complaining of severe acid reflux, which has been worse for the last 2 days.  She complains of burning in her esophagus.  She also states that foods are lodging in her esophagus.  Dysphagia has been occurring for 2-3 months.  She states she tried to eat a piece of sausage and a biscuit yesterday, and was unable to swallow.  She says this morning, she was unable to swallow her gritsand they are lodged in her esophagus.  She takes Tylenol and Tums on a p.r.n. basis for her acid reflux.  She gives a history of her last EGD being years ago by Dr. Karilyn Cota for bleeding ulcers.  I will try to locate that record.  Her appetite has been good.  There has been no weight loss.  She usually has a bowel movement once a day.  There is no prior history of having dysphagia.  SURGERIES:  She has had a cholecystectomy in the past,  2 C-sections, she has had a right carpal tunnel repair and she has had a uterine ablation.  MEDICAL HISTORY: 1. Asthma. 2. Hypertension. 3. Low back pain and arm pain. 4. Gastric ulcers.  MEDICATIONS: 1. She is on labetalol 200 mg twice a day 2. Norvasc 5 mg daily. 3. Diazepam 10 mg p.r.n. 4. Hydrocodone 10/325 as needed.  SOCIAL HISTORY:  She is in a relationship x10 years.  She has one child in good health.  She smokes one pack every 2 days.  She occasionally drinks alcohol.  She denies any NSAIDs or Aleve on a  regular basis.  FAMILY HISTORY:  Her mother is alive with diabetes, coronary artery disease, COPD and fibromyalgia.  Father is deceased from multiple myeloma.  One sister in good health with hypertension.  One brother deceased he committed suicide.  ALLERGIES:  There are no known drug allergies.  OBJECTIVE/PHYSICAL EXAMINATION:  HEENT:  Her conjunctiva is pink.  Her sclera is anicteric.  She has natural teeth.  Her oral mucosa is moist. There are no lesions.  Her thyroid is normal.  There is no cervical lymphadenopathy. LUNGS:  Bilateral wheezes in all 4 fields. HEART:  Regular rate and rhythm.  I did not hear a murmur that she reports that she has.  This could be due to her wheezing. ABDOMEN:  Soft.  Bowel sounds are positive.  No masses.  She has slight tenderness to the epigastric region. EXTREMITIES:  There is no edema to her extremities.  LABORATORY DATA:  CBC on October 31, 2010, hemoglobin 12.9, hematocrit  is 39.7, WBC count is 6.6, 92 neutrophils.  BMET on October 31, 2010, sodium 138, potassium is 3.5, chloride of 106, glucose is elevated at 135, BUN 4, creatinine 0.59.  ASSESSMENT:  Ms. Akter is a 45 year old female admitted with exacerbation of her asthma.  She is also complaining of acid reflux and solid food dysphagia.  An esophageal stricture also needs to be ruled out.  RECOMMENDATIONS:  We will schedule an EGD/ED with Dr. Karilyn Cota today. Agree with Protonix 40 mg b.i.d. and will keep her n.p.o.  Thank you for allowing [place] to participate in her care.    ______________________________ Dorene Ar, NP   ______________________________ Lionel December, M.D.    TS/MEDQ  D:  11/02/2010  T:  11/02/2010  Job:  [id number]  Electronically Signed by Dorene Ar PA on 11/09/2010 10:42:31 AM Electronically Signed by Lionel December M.D. on 11/18/2010 09:54:48 PM

## 2010-11-18 NOTE — Op Note (Signed)
  NAMECHARYL, MINERVINI              ACCOUNT NO.:  [id number]  MEDICAL RECORD NO.:  [id number]           PATIENT TYPE:  I  LOCATION:  A337                          FACILITY:  [facility]  PHYSICIAN:  Lionel December, M.D.    DATE OF BIRTH:  April 15, 1966  DATE OF PROCEDURE:  11/02/2010 DATE OF DISCHARGE:                              OPERATIVE REPORT   PROCEDURE:  Esophagogastroduodenoscopy with esophageal dilation.  INDICATION:  [name] is 45 year old Caucasian female who was hospitalized 3 days ago with acute exacerbation of her asthma and bronchitis and improving with therapy.  She is also complaining of dysphagia to solids. She has had her esophagus dilated about 10 years ago.  She says lately she has been having daily heartburn.  Presently, she is on double dose PPI.  She is undergoing diagnostic/therapeutic EGD.  Procedure risks were reviewed with the patient and informed consent was obtained.  MEDICATIONS FOR CONSCIOUS SEDATION:  Cetacaine spray for pharyngeal topical anesthesia, Demerol 50 mg IV, Versed 10 mg IV in divided dose.  FINDINGS:  Procedure performed in endoscopy suite.  The patient's vital signs and O2 saturations were monitored during the procedure and remained stable.  The patient was placed in left lateral recumbent position and Pentax videoscope was passed through oropharynx without any difficulty into esophagus.  Esophagus:  Mucosa of the esophagus normal.  No exudate was noted to suggest Candida esophagitis.  There was single erosion at GE junction with focal erythema.  GE junction was at 36 cm and hiatus was at 38 cm. In the distal esophagus about 2 cm proximal to GE junction, there was some decrease in luminal diameter, but there was no ring or stricture noted.  Stomach:  It was empty and distended very well with insufflation.  Folds of proximal stomach were normal.  Examination of mucosa revealed patchy linear erythema at antrum and 2 small erosions.  Pyloric  channel was patent.  Angularis, fundus, and cardia were examined by retroflexing scope and were normal.  Duodenum:  Bulbar mucosa was normal.  The scope was passed into second part of duodenum where mucosa and folds were normal.  Endoscope was withdrawn.  Esophageal dilation was performed by passing 54-French Maloney dilator to full insertion.  As the dilator was withdrawn, endoscope was passed again and mucosal disruption noted just below the UES indicative of esophageal web.  Pictures were taken for the record.  Endoscope was withdrawn.  The patient tolerated the procedure well.  FINAL DIAGNOSES: 1. Erosive reflux esophagitis with 2-cm size sliding hiatal hernia. 2. 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 3. Erosive antral gastritis.  RECOMMENDATIONS: 1. Continue antireflux measures. 2. PPI b.i.d. scheduled for 12 weeks and thereafter once every     morning. 3. We will check her H. pylori serology. 4. Lovenox can be resumed in a.m.     Lionel December, M.D.     NR/MEDQ  D:  11/02/2010  T:  11/03/2010  Job:  [id number]  cc:   [name], MD Fax: [phone]  Electronically Signed by Lionel December M.D. on 11/18/2010 09:55:00 PM

## 2010-11-20 ENCOUNTER — Ambulatory Visit (INDEPENDENT_AMBULATORY_CARE_PROVIDER_SITE_OTHER): Payer: Medicaid Other | Admitting: Internal Medicine

## 2010-11-27 NOTE — Assessment & Plan Note (Signed)
[facility]                            CARDIOLOGY OFFICE NOTE   NAME:[name], [name]                     MRN:          [id number]  DATE:07/22/2007                            DOB:          1965/09/29    I was asked by Dr. Emelda Fear of the [facility] to evaluate Ms.  Henrickson, 45 year old [place] for heart murmur.   HISTORY OF PRESENT ILLNESS:  She is 45 years of age, single and about [redacted]  weeks pregnant.  She lost her first child about 6 months into the  pregnancy.   She is high risk because of her age and history of gestational diabetes.   She was told as a child that she had a hole in her heart. She was also  told by her mother that it healed over.  I have no records.   She has been told she had a heart murmur since birth as well.   She has noted lately that when she gets up too quickly, particularly  going right to left or from just standing up to quick, she gets dizzy.  She has really had no chest pain per se.  She has not blacked out  completely.  She denies any palpitations.   PAST MEDICAL HISTORY:   ALLERGIES:  She is not allergic to any medications. There is no history  of dye allergy.   She does not smoke.  She quit 5 months ago.  Does not use any  recreational drugs and does not drink alcohol.  She enjoys walking.   SURGICAL HISTORY:  1. C-section 2005.  2. Carpal tunnel right hand in the past.  3. Gallbladder 2005.   FAMILY HISTORY:  Apparently her sister had some heart disease, but we  are not sure exactly what.   CURRENT MEDICATIONS:  She is on prenatal vitamins and labetalol 100 mg a  day.  She has a history of high blood pressure.   SOCIAL HISTORY:  She does not work.  Single.  She has no living  children.   REVIEW OF SYSTEMS:  Other than the HPI is positive for some fatigue,  constipation, history of gastroesophageal reflux and gestational  diabetes.   PHYSICAL EXAMINATION:  Her blood pressure was 118/65  lying down, heart  rate of 77;  standing it was 109/69, heart rate of 84 and standing after  that as stated 114/70 with a heart rate of 86.  She was a little  lightheaded when she went from lying to first standing.  She  is 5 feet  4 inches and weighs 170 pounds.  She is in no acute distress.  Alert and  oriented x3.  HEENT:  Normocephalic, atraumatic.  PERRLA.  Extraocular movements is  intact.  Sclerae are slightly injected.  PERRLA.  Facial symmetry is  normal.  Carotid upstrokes were equal bilaterally without bruits, no  JVD.  Thyroid is not enlarged.  Trachea is midline.  LUNGS:  Clear.  HEART:  Reveals a nondisplaced PMI.  She has a soft systolic murmur at  the apex going up the left sternal border.  S2 splits physiologically.  ABDOMEN: is gravida and protuberant.  There is good bowel sounds.  Organomegaly could not be assessed.  EXTREMITIES:  No no edema.  Pulses  are intact.  NEURO:  Exam is intact.   Electrocardiogram from the [facility] showed normal sinus rhythm  with no EKG changes or ST-segment changes.   ASSESSMENT:  1. Orthostatic hypotension.  2. Murmur.  There is this questionable history of congenital defect.      I suspect this murmur is a flow murmur, but we need to rule out any      structural heart disease.   PLAN:  1. Orthostatic precautions and prevention reviewed at length.  2. A 2-D echo.  If her echo is normal we will see her back on a p.r.n.      basis.     Thomas C. Daleen Squibb, MD, [facility]  Electronically Signed    TCW/MedQ  DD: 07/22/2007  DT: 07/22/2007  Job #: [id number]   cc:   Patrica Duel, M.D.  Tilda Burrow, M.D.  [facility]

## 2010-11-27 NOTE — H&P (Signed)
[name], [name]              ACCOUNT NO.:  [id number]   MEDICAL RECORD NO.:  [id number]          PATIENT TYPE:  AMB   LOCATION:  DAY                           FACILITY:  [facility]   PHYSICIAN:  Tilda Burrow, M.D. DATE OF BIRTH:  May 27, 1966   DATE OF ADMISSION:  DATE OF DISCHARGE:  LH                              HISTORY & PHYSICAL   CHIEF COMPLAINT:  Desire for elective permanent sterilization and heavy  and prolonged menses.   This is a 45 year old female well-known to our practice, gravida 3, para  2-0-1-1 and in 45 years of age who is here for consideration of tubal  ligation and addressing of heavy menses.  She has had acute challenging  OB history with first baby loss due to vasa previa with bleeding at term  resulting an emergency section, but baby did not survive.  With the last  baby, the child was delivered at 37 weeks alive and well.  She desires  permanent sterilization.  She has had an IUD for 10 months.  There is no  way that she is a candidate for use of this until 5 years, but declines  to consider continued IUD use.  She wants to go ahead and make a  permanent decision.  Her only child is now 55-month-old and doing well.  Additionally, the patient complains that her periods are heavier and  satisfying and not heavy they are when she is not on the IUD.  While we  plan to take the IUD out and proceed with the requested permanent  sterilization, we have discussed endometrial ablation as a way of  controlling menses.  The patient desires this procedure to be performed  and understands the technical limitations of the procedure.  We will  quote a 90% success rate with controlled menses than her light to  minimal.  She understands that there are potential complications of any  procedure including abnormalities that require intervention later.   PAST MEDICAL HISTORY:  Positive for hypertension, asthma, fetal loss of  37 weeks from vasa previa.   PAST SURGICAL  HISTORY:  Cesarean section x2.   FAMILY HISTORY:  Positive hypertension, diabetes, and heart disease.   SOCIAL HISTORY:  The patient is a nonsmoker, nondrinker.   PHYSICAL EXAMINATION:  VITAL SIGNS:  Height 5 and 5, weight 165.4, and  blood pressure 130/100.  HEENT:  Pupils equal, round, and reactive.  NECK:  Supple.  Normal trachea and normal thyroid.  CHEST:  Clear to auscultation.  ABDOMEN:  Deferred.  PELVIC:  Normal external genitalia.  Normal female, the exam uterus is  anteflexed and nontender.  Cervix with IUD string present.   IMPRESSION:  1. Desire permanent sterilization.  2. History of menorrhagia, desiring endometrial ablation.  3. Intrauterine device with need for removal.  4. Chronic hypertension, borderline control.  5. Asthma, stable.   PLAN:  We will proceed with removal of IUD, hysteroscopy, D and C,  endometrial ablation, and laparoscopic tubal sterilization to be  performed October 10, 2008.      Tilda Burrow, M.D.  Electronically Signed  JVF/MEDQ  D:  10/07/2008  T:  10/08/2008  Job:  [id number]   cc:   [facility]   Patrica Duel, M.D.  Fax: [phone]

## 2010-11-27 NOTE — Group Therapy Note (Signed)
NAMELASHONDRA, [name] NO.:  [id number]   MEDICAL RECORD NO.:  [id number]          PATIENT TYPE:  INP   LOCATION:  A309                          FACILITY:  [facility]   PHYSICIAN:  Monte Fantasia, MD  DATE OF BIRTH:  1965/11/29   DATE OF PROCEDURE:  DATE OF DISCHARGE:                                 PROGRESS NOTE   A 45 year old female patient admitted on May 03, 2008 with acute  exacerbation of asthma.  The patient was given IV steroids and round the  clock albuterol/Atrovent nebulizations.  The patient is feeling much  better than yesterday, improved symptomatically, but still feels  shortness of breath.   PHYSICAL EXAMINATION:  GENERAL:  The patient is comfortable, sitting, no  evidence of any respiratory distress.  VITALS:  Her temperature has been 97 with a pulse of 73, respiratory  rate of 18, and blood pressure of 159/89.  HEENT:  Pupils are equal and reacting to light.  No pallor.  No icterus.  NECK:  No lymphadenopathy.  No JVD.  RESPIRATORY:  Air entry is bilaterally good.  Bilateral diffuse rhonchi  all over, both chest fields.  CVS:  S1 and S2 heard.  No evidence of any gallops or murmurs.  ABDOMEN:  Soft.  No organomegaly.  No distention.  No tenderness.  No  guarding.  No rigidity.  EXTREMITIES:  No edema of the feet.   LABS:  WBC count of 7.9, hemoglobin and hematocrit of 13.4/39, and  platelet of 240, neutrophils 88, lymphocytes 10, eosinophils 0.  PT is  14, INR is 1.1, PTT is 26.  Sodium of 137, chloride 106, bicarb 25, BUN  of 7, creatinine of 0.6.  Liver functions are within normal limits.  Cardiac enzymes 3 sets have been negative.  UA is negative.   ASSESSMENT AND PLAN:  Acute exacerbation of asthma.  The patient still  has diffuse wheezes. To continue IV steroids, albuterol/Atrovent  nebulizations round the clock, peak flow every shift q.8 hours.   Deep venous thrombosis and gastrointestinal prophylaxis.  We will  reassess  tomorrow.      Monte Fantasia, MD  Electronically Signed     MP/MEDQ  D:  05/04/2008  T:  05/05/2008  Job:  [id number]

## 2010-11-27 NOTE — H&P (Signed)
[name], [name] NO.:  [id number]   MEDICAL RECORD NO.:  [id number]          PATIENT TYPE:  INP   LOCATION:  A309                          FACILITY:  [facility]   PHYSICIAN:  Dorris Singh, DO    DATE OF BIRTH:  01-21-1966   DATE OF ADMISSION:  DATE OF DISCHARGE:  LH                              HISTORY & PHYSICAL   PRIMARY CARE PHYSICIAN:  Patrica Duel, M.D.   CHIEF COMPLAINT:  Shortness of breath.   Patient is a 45 year old female who presented to [facility] after failing outpatient therapy for acute exacerbation of asthma.  Patient was seen on October 19th in [facility] Room  complaining of asthma attack.  She was given steroids and had albuterol  medication at home and did not get any better.  She came back for new  evaluation today.  Patient had been given an hour long neb treatment and  did not improve.  When they had decided to discharge her, they  determined that she was not improving and at that point in time needed  to be admitted into the hospital.   PAST MEDICAL HISTORY:  1. Asthma.  2. Hypertension.   SOCIAL HISTORY:  She is a nonsmoker.  No drug abuse.  Occasionally uses  alcohol and lives with relatives.   ALLERGIES:  She has no known drug allergies.   CURRENT MEDICATIONS:  1. Labetalol once daily.  2. Albuterol 2 puffs as needed.   REVIEW OF SYSTEMS:  CONSTITUTIONAL:  Negative for fever and chills.  EYES:  Negative for changes in vision or eye pain.  ENMT:  Negative for  nose pain, ear pain, or sore throat.  CARDIOVASCULAR:  Negative for  chest pain, palpitations.  RESPIRATORY:  Positive for dyspnea and  wheezing.  GASTROINTESTINAL:  Negative for nausea, vomiting, diarrhea,  abdominal pain.  GU:  Negative for dysuria or flank pain.  MUSCULOSKELETAL:  Negative for arthralgias, back pain, or neck pain.  SKIN:  Negative for rash.  NEURO:  Positive for a headache.  PSYCHIATRIC:  Negative for depression.   HEMATOLOGIC:  Negative for  anemia.   PHYSICAL EXAMINATION:  VITALS:  Blood pressure 148/77, pulse rate 106,  respirations 32, temperature 97.6.  Pulse oximetry 98%.  GENERAL:  Patient is a 45 year old Caucasian female who is well-  developed and well-nourished in no acute distress.  HEART:  Regular rate and rhythm.  LUNGS:  Positive expiratory and inspiratory wheezing with coarse breath  sounds.  ABDOMEN:  Soft, nontender, nondistended.  EXTREMITIES:  Positive pulses.  No edema, ecchymosis, or cyanosis.   Her chest x-ray showed no active disease.   She had an ABG done which demonstrated a pH of 7.4, pCO2 35.7, pO2 98.2,  bicarbonate 21.6, pCO2 19.2.  White count 7.2, hemoglobin 13.6,  hematocrit 39.2, platelet count 249.  Urine is negative.   ASSESSMENT:  1. Acute exacerbation of asthma.  2. Hypertension.  3. Headache.   PLAN:  Will admit patient to the service of [facility].  Will go ahead  and start around-the-clock nebulizer treatments with albuterol  and  Atrovent.  Will place the patient on IV steroids q.8h.  Also, will give  her an initial dose of steroids while she is here.  Will keep the  patient on her home medications.  Will do DVT and GI prophylaxis.  Patient is also complaining of headache.  Will go ahead and give her one  liter bolus of fluids.  Will give her NSAIDs and possibly may need a  narcotic medication if needed.  Will continue to monitor her and change  therapy as necessary.  Anticipate patient being discharged in the next 1-  2 days.      Dorris Singh, DO  Electronically Signed     CB/MEDQ  D:  05/03/2008  T:  05/03/2008  Job:  [id number]   cc:   Patrica Duel, M.D.  Fax: [phone]

## 2010-11-27 NOTE — Op Note (Signed)
NAMEMEHAK, ROSKELLEY NO.:  [id number]   MEDICAL RECORD NO.:  [id number]          PATIENT TYPE:  INP   LOCATION:  9116                          FACILITY:  [facility]   PHYSICIAN:  Ginger Carne, MD  DATE OF BIRTH:  Jun 02, 1966   DATE OF PROCEDURE:  11/10/2007  DATE OF DISCHARGE:                               OPERATIVE REPORT   PREOPERATIVE DIAGNOSES:  1. Premature rupture of membranes at 37-1/7th weeks gestation.  2. Chronic hypertension.  3. Previous cesarean section.   POSTOPERATIVE DIAGNOSIS:  Term viable delivery of female infant.   PROCEDURE:  Repeat low-transverse cesarean section.   SURGEON:  Ginger Carne, MD.   ASSISTANT:  Karlton Lemon, MD.   ESTIMATED BLOOD LOSS:  600 mL.   COMPLICATIONS:  None immediate.   ANESTHESIA:  Spinal.   SPECIMEN:  Cord bloods.   OPERATIVE FINDINGS:  This is a term infant female delivered in vertex  presentation.  Apgar and weight per delivery room record.  No gross  abnormalities and baby cried spontaneously at delivery, and amniotic  fluid was clear.  Three-vessel cord, central insertion complete  placenta.  Uterus, tubes and ovaries showed normal decidual changes of  pregnancy.   OPERATIVE PROCEDURE:  The patient prepped and draped usual fashion and  placed in the left lateral supine position.  Betadine solution used for  antiseptic and the patient was catheterized prior to procedure.  After  adequate spinal analgesia, a Pfannenstiel incision was made and the  abdomen opened.  Lower uterine segment incised transversely after  developing the bladder flap.  Baby delivered, cord clamped and cut and  infant given to the pediatric staff after bulb suctioning.  Placenta  removed manually.  Uterus inspected.  Closed the uterine musculature in  1 layer using 0-Vicryl running interlocking suture.  Bleeding points  hemostatically checked.  Blood clots  removed.  Closure of the fascia and 1layer with 0 double PDS  running  suture and skin staples for the skin.  Instruments and sponge count were  correct.  The patient tolerated the procedure well and returned to the  post anesthesia recovery room in excellent condition.      Ginger Carne, MD  Electronically Signed     SHB/MEDQ  D:  11/11/2007  T:  11/11/2007  Job:  [id number]

## 2010-11-27 NOTE — Op Note (Signed)
[name], [name]              ACCOUNT NO.:  [id number]   MEDICAL RECORD NO.:  [id number]          PATIENT TYPE:  AMB   LOCATION:  DAY                           FACILITY:  [facility]   PHYSICIAN:  Tilda Burrow, M.D. DATE OF BIRTH:  October 12, 1965   DATE OF PROCEDURE:  10/10/2008  DATE OF DISCHARGE:                               OPERATIVE REPORT   PREOPERATIVE DIAGNOSES:  Elective sterilization, menorrhagia.   POSTOPERATIVE DIAGNOSES:  Elective sterilization, menorrhagia.   PROCEDURE:  Laparoscopic tubal sterilization with Falope rings removal  of intrauterine device, hysteroscopy, and endometrial ablation.   FINDINGS:  Normal uterus with scarring from old C-section.  Normal-  appearing endometrial cavity.  Evidence of omental adhesions from the  anterior abdominal wall taken down, and then standard tubal  sterilization performed thereafter.   DETAILS OF PROCEDURE:  The patient was taken to the operating room,  prepped and draped for combined abdominal and vaginal procedure.  Tubal  sterilization was performed first.  This consisted of grasping the IUD  string, removing the IUD without difficulty, and placing a Hulka  tenaculum on the cervix for uterine manipulation.  The bladder was in-  and-out catheterized.  Appropriate time-out with confirmation of  procedure, plan, and the patient's status had been performed before  initiating the procedure.  The infraumbilical vertical 1 cm skin  incision was performed as well as transverse suprapubic skin incision.  Veress needle was introduced through the umbilicus without difficulty.  Water droplet test confirmed proper position and the pneumoperitoneum  easily achieved with 3 liters CO2 introduced.  5-mm laparoscopic trocar  was introduced through the umbilicus and visualization of the pelvis  showed that there was extensive omental adhesions to the midline  anterior abdominal wall.  This precluded easy access to the suprapubic  area for  placement of suprapubic trocar.  So, a third trocar position  was selected in the right lower quadrant and an 8-mm trocar was placed  under direct visualization without difficulty.  Camera was converted to  the right lower quadrant position and operative Metzenbaum scissors were  used to take down the omentum from the anterior abdominal wall without  difficulty.  Point cautery was then used to achieve hemostasis and the  entire omental adhesions were taken down.  Attention was then directed  to the pelvis.  The camera was redirected to the subumbilical site and  the laparoscopic Falope ring applier was placed through the right lower  quadrant trocar and used to identify the left fallopian tube which was  identified, elevated to visualize its fimbriated end, and then a Falope  ring placed on the midportion of the tube incarcerating an appropriate  knuckle of tube which was then infiltrated percutaneously with dilute  Marcaine with epinephrine to help with postoperative pain management.  On the right side, a similar technique was used and Falope ring placed  on the proximal portion of the tube with ease and anesthetized with  Marcaine solution percutaneously.  Deflation of the abdomen was  followed, 80 mL of saline were placed in the abdomen.  The laparoscopic  trocar was  removed and subcuticular closure of the umbilical and right  lower quadrant site performed and then Steri-Strips placed on all 3 skin  incisions.   The endometrial ABLATION:  The Hulka tenaculum was removed.  A single-  tooth tenaculum used to grasp the anterior lip of the cervix.  The  uterus sounded to 7.5 cm in the midline position and then we proceeded  with identifying the endometrial cavity with hysteroscopy, which showed  a smooth uterine contour evidence of normal prior C-section indentation  and no tissue debris of significance.  Hysteroscopy was completed and  endometrial ablation initiated using the Gynecare  ThermaChoice III  endometrial ablation device in standard fashion.  9 mL of saline were  introduced into the balloon once the testing process was completed and  the patient had an 8-minute ablation sequence completed without  difficulty and the 9 mL of saline recovered.  Percutaneous Marcaine  solution was instilled in the paracervical tissues to improve analgesia  and then repeat hysteroscopy performed confirmed that the ablation had  resulted in thermal changes down to the edge of the C-section scar.  The  patient tolerated procedure well and went to recovery room in good  condition.  Sponge and needle counts were correct.   DISCHARGED PAIN MEDICATIONS:  Motrin and Vicodin.      Tilda Burrow, M.D.  Electronically Signed     JVF/MEDQ  D:  10/10/2008  T:  10/11/2008  Job:  [id number]   cc:   [facility]

## 2010-11-27 NOTE — Discharge Summary (Signed)
NAMEIOLANI, TWILLEY NO.:  [id number]   MEDICAL RECORD NO.:  [id number]          PATIENT TYPE:  INP   LOCATION:  A309                          FACILITY:  [facility]   PHYSICIAN:  Monte Fantasia, MD  DATE OF BIRTH:  1965-12-08   DATE OF ADMISSION:  DATE OF DISCHARGE:  10/22/2009LH                               DISCHARGE SUMMARY   A 45 year old lady patient was admitted on May 03, 2008, to [facility] for acute exacerbation of asthma.  The patient received IV  steroids and albuterol, Atrovent nebulization since admission and has  recovered well with the chest tightness and shortness of breath.  The  patient at present today morning has been feeling much better and is fit  to be discharged.   PAST MEDICAL HISTORY:  Significant for asthma and hypertension.   PHYSICAL EXAMINATION:  VITAL SIGNS: Today morning, temperature of 98,  pulse of 102, respirations of 20, blood pressure of 165/93, and O2 sats  of 95% room air.  HEENT/NECK: Supple.  No pallor.  No icterus.  No  lymphadenopathy.  Pupils equal and reacting to light.  No respiratory distress.  RESPIRATORY: Air entry is bilaterally equal.  No rales.  Diffuse  occasional rhonchi bilateral lung fields.  CVS: S1 and S2 heard.  Regular rate and rhythm.  ABDOMEN: Soft, no distention nor no organomegaly.  No masses felt.  No  rigidity.  EXTREMITIES: No edema.   LABORATORIES:  Total WBC count of 7.9, H&H of 13.4/39, platelets of 240,  and neutrophils of 88.  PT is 14, INR is 1.1.  Sodium 137, potassium  3.9, chloride 106, bicarb 25, glucose 140, BUN 7, and creatinine of 0.6.   Chest x-ray showed heart and mediastinal contours were normal.  Lungs  were clear and no active disease.   ASSESSMENT AND PLAN:  Acute asthma exacerbation.  The patient  symptomatically much better and we will continue with albuterol and  Atrovent MDI q.6 h.  The patient can continue with oral prednisone 40 mg  on tapering doses to  discontinue.  The patient advised to follow up with  the primary care physician in 1 week .  Deep vein thrombosis and gastrointestinal prophylaxis on Lovenox and  Protonix.      Monte Fantasia, MD  Electronically Signed     MP/MEDQ  D:  05/05/2008  T:  05/05/2008  Job:  [id number]

## 2010-11-27 NOTE — Discharge Summary (Signed)
[name], [name]              ACCOUNT NO.:  [id number]   MEDICAL RECORD NO.:  [id number]          PATIENT TYPE:  INP   LOCATION:                                [facility]   PHYSICIAN:  Tanya S. Shawnie [name], M.D.   DATE OF BIRTH:  14-Nov-1965   DATE OF ADMISSION:  11/10/2007  DATE OF DISCHARGE:  11/14/2007                               DISCHARGE SUMMARY   ADMITTING DIAGNOSES:  1. Intrauterine pregnancy at 37 weeks and 0 days' gestation.  2. Spontaneous rupture of membranes.  3. History of cesarean section with previous pregnancy.  4. Gestational diabetes.  5. Hypertension.  6. Advanced maternal age.   DISCHARGE DIAGNOSES:  1. Status post repeat low transverse cesarean section at 37 weeks      secondary to rupture of membranes.  2. Hypertension.  3. Gestational diabetes.   PROCEDURE:  Repeat low transverse cesarean section.   HOSPITAL COURSE:  The patient was admitted on November 10, 2007.  This is a  45 year old gravida 2, para 1-0-0-0 at 37 weeks and 0 days' gestation  with complaints of large vessel fluid.  She was found to have ruptured  membranes.  The patient was scheduled to have a repeat cesarean section  at 39 weeks; however, given her rupture status, the decision was made to  proceed with the desired repeat cesarean section which was performed by  Dr. Mia Creek and Dr. Darrol Angel.  Her preop diagnosis was premature rupture  of membranes, chronic hypertension, IUP at 37 and 1 weeks' gestation, A1  gestational diabetic.  This procedure produced a female infant in vertex  presentation with no complications.  EDL was 600 mL.  Please see the  dictated operative note for the details of this surgery.  On  postoperative day #1, vital signs were stable.  Abdomen was soft.  Positive bowel sounds.  Incision was clean, dry, and intact.  Her  hemoglobin was 11 and hematocrit was 32.  On postoperative day #2, again  the patient is feeling well, ambulating without difficulty.  Vital  signs  were stable.  Exam was within normal limits.  Incision is free of  drainage.  The patient's glyburide was discontinued secondary to normal  blood sugars.  On postoperative day #3, the patient was discharged,  breastfeeding, vital signs were stable.  Incision was well-approximated  without drainage.  Her exam was within normal limits.   Condition on discharge is good.   Her diet is regular.   Her activity is no heavy lifting, no driving x2 weeks, no vaginal entry.   Her follow up was scheduled for her to return to the [facility] Clinic in 3  days for a staple remover and then also has to go for [facility] in 6 weeks for a postpartum visit and IUD placement.  She was  given instructions to call for temp greater than 100 degrees, persistent  nausea, vomiting, heavy vaginal bleeding, or any redness or drainage  from the incisional site.   Her discharge medications were;  1. Percocet 5/325.  She was given #30.  Instructions to take  1-2 q.2-4      h. p.r.n. pain.  2. Motrin 600 mg every 6 hours p.r.n. pain.  3. Labetalol as directed.      [name], C.N.M.      [name], M.D.  Electronically Signed    SS/MEDQ  D:  01/14/2008  T:  01/14/2008  Job:  [id number]

## 2010-11-30 NOTE — Discharge Summary (Signed)
[name], [name]                        ACCOUNT NO.:  [id number]   MEDICAL RECORD NO.:  [id number]                   PATIENT TYPE:  OBV   LOCATION:  A325                                 FACILITY:  [facility]   PHYSICIAN:  Barbaraann Barthel, M.D.              DATE OF BIRTH:  Jul 28, 1965   DATE OF ADMISSION:  11/16/2003  DATE OF DISCHARGE:  11/18/2003                                 DISCHARGE SUMMARY   PRIMARY DIAGNOSIS:  Cholecystitis and cholelithiasis.   SECONDARY DIAGNOSES:  1. Hypertension.  2. Gastroesophageal reflux disease.  3. Anxiety.   PROCEDURE:  Laparoscopic cholecystectomy on 11/16/2003.   NOTE:  This is a 45 year old white female who is admitted via the outpatient  department for laparoscopic cholecystectomy secondary to cholelithiasis.  She had approximately a 55-month history of discomfort and a sonogram  revealed cholelithiasis.  Her liver function studies and amylase  preoperatively were within normal limits.  Her serum hCG was negative.  Her  electrocardiogram was also normal.  This was reviewed by the  anesthesiologist, etcetera preoperatively.   There was written consider anterior infarct, but this was not deemed  clinically significant.   HOSPITAL COURSE:  Her hospital course was unremarkable.  Her wounds remained  clean.  She had some right upper quadrant pain secondary to the insufflation  of gas which delayed her diagnosis somewhat and she was discharged on  11/18/2003.  At that time she was tolerated p.o. well. Her wounds were  clean.  She was voiding without dysuria.  She had no leg pain and no  shortness of breath.  Her blood pressure was slightly elevated; however, and  we address this as an outpatient.   DISCHARGE MEDICATIONS:  1. I have discharged her on hydrochlorothiazide 12.5 mg p.o. daily.  2. K-Dur 1 tablet p.o. daily.  3. Darvocet-N 100 one tab p.o. q.4h. p.r.n. pain.  4. She is to continue her preoperative medicines for anxiety and  GERD.   DISCHARGE INSTRUCTIONS:  1. The rest of the discharge instructions include to clean her wounds with     alcohol 3 times a day.  2. She is excused from work.  3. She is prescribed a full liquid and soft diet.  4. Told to do no heavy lifting greater than 15 pounds.  5. We will follow her perioperatively after that she is to return to Dr.     Emelda Fear as her regular physician.     ___________________________________________                                         Barbaraann Barthel, M.D.   WB/MEDQ  D:  11/18/2003  T:  11/18/2003  Job:  [id number]   cc:   Barbaraann Barthel, M.D.  [address]  [place]  [place] [address]  Fax: [phone]  Tilda Burrow, M.D.  [address] [place]  [place] [address]  Fax: [phone]

## 2010-11-30 NOTE — Discharge Summary (Signed)
[name], [name]                           ACCOUNT NO.:  [id number]   MEDICAL RECORD NO.:  [id number]                   PATIENT TYPE:  OIB   LOCATION:  A418                                 FACILITY:  [facility]   PHYSICIAN:  Tilda Burrow, M.D.              DATE OF BIRTH:  March 03, 1966   DATE OF ADMISSION:  03/19/2003  DATE OF DISCHARGE:                                 DISCHARGE SUMMARY   ADMITTING DIAGNOSIS:  1. Severe epigastric pain.  2. History of ulcers.  3. Suspected gastroenteritis.   DISCHARGE DIAGNOSES:  1. Gastroenteritis, resolved.  2. History of ulcers.  3. Pregnancy [redacted] weeks gestation.   PROCEDURE:  Observation and IV fluid hydration x24 hours.   DISCHARGE MEDICATIONS:  1. Prevacid 30 mg p.o. q.a.m.  2. Prenatal vitamins.  3. Stool softener once daily.   HISTORY OF PRESENT ILLNESS:  This 45 year old female, gravida 1, para 0 due  February 11 by ultrasound is followed through our office with records not  available at this time.  She was admitted after presenting to the emergency  room complaining of severe epigastric pain beginning at midnight associated  with marked diarrhea with development of vomiting later this a.m.  She  presents in moderate discomfort in the epigastric area.  She has a history  that is significant for 3 bleeding ulcers necessitating hospitalization at  [facility] under Dr. Patty [name] care in 2003 with initiation of acid  H2 blockers at that point in time. She has also had a hospitalization for  nervous colon and gastritis.  She is admitted for fluid hydration,  supportive care, and anti-emetics until her suspected gastroenteritis  resolved.   PAST MEDICAL HISTORY:  1. Bleeding ulcers.  2. Nervous colon.  3. Gastritis.   PAST SURGICAL HISTORY:  1. Carpal tunnel syndrome, right hand requiring surgery.  2. Endoscopy 2003 Dr. Karilyn [name].   ALLERGIES:  None known.   MEDICATIONS ON ADMISSION:  Prevacid, prenatal vitamins, and  Colace.   PHYSICAL EXAMINATION:  VITAL SIGNS: Height 5 feet 4 inches.  Weight 140,  temperature 97.7, pulse 77.  GENERAL:  Exam shows an alert oriented female in obvious discomfort.  No  diaphoresis.  No suspected cardiac abnormalities.  CHEST:  Clear.  CARDIAC:  Regular rate and rhythm.  ABDOMEN:  Bowel sounds active. No rebound tenderness.  PELVIC: Exam deferred.   HOSPITAL COURSE:  The patient was admitted, received fluid bolus at 150 cc  per hour of lactated D-5LR.  GI cocktail was administered which made minimal  change in her discomfort.  Phenergan 12.5 mg IV as needed was used for pain  along with Nubain.  Due to the persistent vomiting she was placed NPO status  overnight and by the following morning felt dramatically better.  She was  placed on regular diet, tolerated breakfast and was sent home without pain  or discomfort for follow up in  our office as scheduled.                                                Tilda Burrow, M.D.    JVF/MEDQ  D:  03/20/2003  T:  03/20/2003  Job:  [id number]

## 2010-11-30 NOTE — Op Note (Signed)
[name], HOR                        ACCOUNT NO.:  [id number]   MEDICAL RECORD NO.:  [id number]                   PATIENT TYPE:  OIB   LOCATION:  A407                                 FACILITY:  [facility]   PHYSICIAN:  Tilda Burrow, M.D.              DATE OF BIRTH:  [birth year]   DATE OF PROCEDURE:  DATE OF DISCHARGE:                                 OPERATIVE REPORT   PREOPERATIVE DIAGNOSES:  1. Pregnancy 37 weeks.  2. Third trimester bleeding, abruption versus placenta previa, versus     vasoprevia; fetal distress.   POSTOPERATIVE DIAGNOSES:  1. Third trimester bleeding secondary to vasoprevia; fetal distress.  2. Pregnancy 37 weeks, delivered.   PROCEDURE:  Primary low transverse cervical cesarean section.   SURGEON:  Tilda Burrow, M.D.   ASSISTANTAmie Critchley, CST.   ANESTHESIA:  General--Idacavage, CRNA   COMPLICATIONS:  None.   FINDINGS:  Normal appearing external uterine surfaces, clear amniotic fluid.  Breech present in front with extremely distinct pallor, consistent with  anemia.  Abnormal placenta with cord insertion in anterior fundal portion of  the uterus with a wide-spread multilobe placental bed with extensive  vasoprevia network over the right side of the lower uterine segment  extending into the area near the cervical internal os reaching towards a  placenta located in the posterior lower uterine segment.  Severely pale  infant with Apgars __________ , __________, and __________; recurring  extensive resuscitation by Dr. Francoise Schaumann. Halm.  Cord blood and gas pH 7.64,  pCO2 of 14, pO2 of 186 with hematocrit 27.9%.   DETAILS OF PROCEDURE:  An emergency cesarean section was called and Dr. Milford Cage  notified and returned call promptly.  The patient was prepped for surgery  upstairs.  External monitoring performed prior to her leaving the floor and  we could not confirm fetal heart rate, similar to the initial presentation  circumstance.  We, nonetheless  rushed promptly to the OR, transferred the  patient directly to the C-section table; rapidly prepped and draped, and  proceeded with general anesthesia.   A Pressfit skin incision was performed in standard fashion.  Transverse skin  incision, blunt dissection through the fatty tissues, knife dissection of  the fascia which was then extended laterally using Mayo scissors, entering  the peritoneal cavity through the midline and development of bladder flap on  the lower uterine segment.   A transverse uterine incision was performed revealing normal membranes and  amniotomy reveals clear amniotic fluid.  The infant was identified as a  breech presentation, female, and the buttock were lifted up and the infant  delivered with a standard breech extraction. The infant showed extreme  pallor and no fetal heart rate was identifiable on initial cord palpation.  The cord was milked towards the infant for the distance of approximately 25  cm that was available, then clamped and the infant promptly given to the  care of Dr. Milford Cage who began resuscitation as dictated elsewhere.  Suctioning  of the pharynx had been performed as soon as the baby was delivered.  The  baby showed no initial response.   See Dr. Webb Laws notes for further details.  The internal surface of the  placenta was inspected and there was no evidence of bleeding that could be  identified. The placenta appeared to be thoroughly adherent to the anterior  uterine surfaces with the cord insertion in the midline, anterior fundal  portion of the uterus and inspection of the lateral walls of the lower  uterine segment showed numerous elongate veins across the lower uterine  segment.  These were traced down and went across the lowest portion of the  lower uterine segment and ended in a very thin placental remnant located in  the left posterior lower uterine segment.  This was all teased out by  uterine massage and brought out intact.  (Later,  after the procedure, photos  were taken to document this).   The patient then had a normal appearing uterus which was then irrigated  copiously and then closed in a single layer of running locking closure of #0  chromic.  IV antibiotics were administered by Dr. Bufford Lope, CRNA.   The bladder flap was closed using running 2-0 chromic.  The abdomen was  irrigated.  The peritoneum was closed with 2-0 chromic.  The fascia was  reapproximated using continuous running #0 Vicryl, the subcu fatty tissue  was reapproximated with 2-0 plain and the staple closure of the skin  completed the procedure.  Due to the lack of a complete instrument count  prior to the procedure an x-ray was obtained which confirmed absence of any  hardware in the abdomen.  Then, the patient was allowed to be awakened,  extubated and taken to the recovery room.  The infant's resuscitation was  extensive, lasting greater than 20 minutes.  Please see Dr. Webb Laws notes for  details.      ___________________________________________                                            Tilda Burrow, M.D.   JVF/MEDQ  D:  08/03/2003  T:  08/04/2003  Job:  [id number]   cc:   Tilda Burrow, M.D.  [address] [place]  [place] [address]  Fax: [phone]   Francoise Schaumann. Halm, D.O.  [address], [address]  [place]  [place] [address]  Fax: [phone]

## 2010-11-30 NOTE — H&P (Signed)
[name], [name]                        ACCOUNT NO.:  [id number]   MEDICAL RECORD NO.:  [id number]                   PATIENT TYPE:  OIB   LOCATION:  LDR1                                 FACILITY:  [facility]   PHYSICIAN:  Tilda Burrow, M.D.              DATE OF BIRTH:  Nov 09, 1965   DATE OF ADMISSION:  08/03/2003  DATE OF DISCHARGE:                                HISTORY & PHYSICAL   ADMISSION DIAGNOSES:  1. Third trimester bleeding.  2. Abruption versus placenta previa versus __________  previa fetal distress     at 37 weeks.   HISTORY OF PRESENT ILLNESS:  This 45 year old female is admitted at [redacted] weeks  gestation after presenting with acute onset of vaginal bleeding at 4 p.m. at  home.  She called [facility] where she was directed to seek EMS  transport, and was brought by EMS transport to [facility].  I was  called upon her arrival to the floor with fetal heart rate unable to be  detected by nurse.  Ultrasound performed beginning at approximately 15 to 20  minutes before 6 showed absence of fetal heart motion detectable on  ultrasound.  The infant was in a back-down breach presentation with head  extended with no fetal movement of any sort noted.  Ultrasound was repeated,  the patient and family felt to represent a fetal demise as represented  family at 6 p.m.  Bleeding was minimal at that time but showed relatively  heavy bleeding in the vaginal area.  The ultrasound had also shown that the  placenta was fundal in location with the anterior edge of the placenta  stopping midway down the anterior uterine wall.  There was no identifiable  placenta located in the lower uterine segment on initial ultrasound.  Due to  the posterior position of the infant, confirmatory ultrasound by x-ray was  called for documentation, and they arrived at approximately 6:45 p.m.  Ultrasound there showed an abnormally faint flickering heart rate,  diminished motion range noted at  about 110 beats per minute.  Emergency STAT  C section was called at 6:50 by nearby clock, and the patient was taken  promptly for emergency low transverse cesarean section.  Prior to leaving  the floor to go to the operating room, in the brief moments after completion  of prepping and Foley placement, efforts were once again attempted to  document fetal heart by external auscultation, and even knowing the location  of the thorax and direction, we again could not confirm heart beat.  Nevertheless, we proceeded with cesarean delivery.  Fetal heart motion was  witness by family who were strongly supportive of proceeding.   PAST MEDICAL HISTORY:  Benign.   PAST SURGICAL HISTORY:  Negative.   ALLERGIES:  None known.   DIET:  Last meal eaten were peanuts at 3 p.m.   HISTORY:  The patient's prenatal course had been  followed through our office  with diet-controlled gestational diabetes with most recent blood sugar at  mid day today in the 110 range.  This was a postprandial blood sugar  The  patient stated that she had  some mild contractions over the past week and  was anticipating followup visit to our office next Monday with discussion  for probable planned cesarean delivery at that time.  The infant was found  to be in a breach presentation according to patient.  The patient had no had  any recent trauma to abdomen and denied any significant abdominal pain.  Palpation of abdomen during the ultrasound had only revealed an occasional  mild contraction which patient did not describe as painful.  Frequency of  contractions was not recordable due to the multiple efforts at ultrasound  performed during the assessment.   PHYSICAL EXAMINATION:  GENERAL:  An alert and oriented, tearful,  Caucasian/American [place] female, oriented x 3.  HEENT:  Pupils equal, round, and reactive.  Well hydrated.  NECK:  Normal.  CHEST:  Clear to auscultation.  ABDOMEN:  Term-size gravid uterus, breach infant on  ultrasound with plenty  of amniotic fluid noted on ultrasound and infant in a breach presentation  with back down lie.  Placenta was fundal as previously noted with extension  on the anterior uterine surface to less than half-way down the anterior  uterine wall.  No previa could be identified.  PELVIC:  Cervix was examined after the ultrasound and showed an essentially  closed, firm cervix, 1 cm or less.  There was no active bleeding at the time  of this exam or after it.   ADMISSION DIAGNOSES:  1. Third trimester bleeding.  2. Partial abruption versus placenta previa versus __________ previa fetal     distress.   PLAN:  Emergency cesarean section.     ___________________________________________                                         Tilda Burrow, M.D.   JVF/MEDQ  D:  08/03/2003  T:  08/03/2003  Job:  [id number]   cc:   Francoise Schaumann. Halm, D.O.  [address], [address]  [place]  [place] [address]  Fax: [phone]

## 2010-11-30 NOTE — Consult Note (Signed)
[name], [name]                        ACCOUNT NO.:  [id number]   MEDICAL RECORD NO.:  [id number]                   PATIENT TYPE:  OIB   LOCATION:  A414                                 FACILITY:  [facility]   PHYSICIAN:  [name], M.D.              DATE OF BIRTH:  06-16-1966   DATE OF CONSULTATION:  DATE OF DISCHARGE:  05/26/2003                                   CONSULTATION   GASTROINTESTINAL CONSULTATION NOTE   REQUESTING PHYSICIAN:  [name], M.D.   REASON FOR CONSULTATION:  Epigastric pain.   HISTORY OF PRESENT ILLNESS:  The patient is a 45 year old Caucasian female  who is [redacted] weeks gestation.  She is admitted with recurrent epigastric pain  associated with nausea and vomiting.  She was admitted in September 2004 for  this as well.  She tells me that she has been kept on observation at least 2  more times since September.  She complains of epigastric pain which is worse  postprandially.  She describes it as spasms.  When the pain is fierce she  has been vomiting.  She denies any hematemesis.  She also has significant  obstipation going only every 3-4 days. She is taking stool softeners daily  and uses suppositories as needed.  During recent hospitalization she  received a Fleets enema and states that she felt much better.  She denies  any melena or bright red blood per rectum.  We saw her April 2003 for  similar symptoms. At that time she had an EGD which revealed antral ulcer,  duodenitis, gastritis, and focal edema at the GE junction and a hiatal  hernia.  H. pylori serologies were negative, however. Biopsy revealed  findings consistent with Helicobacter.  She received Prevpac therapy for 2  weeks.  The patient states that she had started feeling better and had  actually come off of her Prevacid.  She was placed back on this empirically  at [redacted] weeks gestation.  She initially had done very well, during her  pregnancy, until around August she started having  these symptoms.   On admission her white count was 12.8, hemoglobin 11.1, hematocrit 32.8,  platelets 364,000.  Total bilirubin 0.4, alkaline phosphatase 101, SGOT 28,  SGPT 33, albumin 2.6, amylase 61, lipase 39.  Abdominal ultrasound revealed  cholelithiasis but no evidence of gallbladder wall thickening or biliary  dilatation.   MEDICATIONS PRIOR TO ADMISSION:  1. Prevacid 30 mg daily.  2. Prenatal vitamin daily.  3. Stool softener daily.  4. Flexeril p.Christinen. abdominal pain.  5. Lortab p.Christinen. abdominal pain.   ALLERGIES:  No known drug allergies.   PAST MEDICAL HISTORY:  1. History of antral ulcer as outlined above.  2. History of IBS.  3. Status post carpal tunnel release right wrist.   FAMILY HISTORY:  Mother and father both had peptic ulcer disease.   SOCIAL HISTORY:  She is single.  This is her first pregnancy.  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  She quit smoking  2 weeks ago.  Denies any alcohol use.   REVIEW OF SYSTEMS:  Please see HPI for GI.  GENERAL:  Does complain of  urinary frequency, but no dysuria.   PHYSICAL EXAMINATION:  VITAL SIGNS:  Temperature 97.2, pulse 86,  respirations 20, blood pressure 135/83.  GENERAL:  A pleasant, well-nourished, well-developed, Caucasian female in no  acute distress.  SKIN:  Warm and dry.  No jaundice.  HEENT:  Conjunctivae are pink. Sclerae anicteric. Oropharyngeal mucosa moist  and pink.  No lesions, erythema or exudate.  NECK:  No lymphadenopathy or thyromegaly.  CHEST:  Lungs are clear to auscultation.  CARDIOVASCULAR:  Cardiac exam reveals regular rate and rhythm.  Normal S1,  S2.  No murmurs, rubs, or gallops.  ABDOMEN:  Positive bowel sounds, distended with enlarged uterus.  Positive  epigastric tenderness to deep palpation. Mild tenderness in the right upper  quadrant.  No hepatosplenomegaly appreciated.  EXTREMITIES:  No edema.   IMPRESSION:  The patient is a pleasant, 45 year old  Caucasian female with  recurrent epigastric pain with nausea and vomiting requiring multiple  hospitalization.  Her pain does have a postprandial component.  In addition,  she also has significant constipation which may be the source of most of her  abdominal pain.  Ultrasound revealed cholelithiasis and I cannot rule out  that her symptoms are related to this.  Given that she did well post-  Helicobacter pylori treatment and coming off of NSAIDs, and the fact that  her pain has really progressed despite Prevacid, would doubt symptoms are  due to peptic ulcer disease.   SUGGESTIONS:  1. Would advise continued proton pump inhibitors and supportive measures.  2. Recommend more aggressive treatment of constipation.  If her abdominal     pain is related to constipation we should note improvement fairly     quickly.  Otherwise if she would continue to have abdominal pain despite     adequate treatment of her constipation would advise her to have a     surgical consultation/opinion.  3. Will discuss treatment and consultation with Dr. Emelda [name] and Dr. Despina [name].   I would like to thank Dr. Despina [name] for allowing [place] to take part in the care of  this patient.     ________________________________________  ___________________________________________  [name], [name], M.D.   LL/MEDQ  D:  05/26/2003  T:  05/26/2003  Job:  [id number]   cc:   [name], M.D.  [address], [address]  [place]  [place] [address]  Fax: [phone]   [name], M.D.  [address]  [place]  [place] [address]  Fax: [phone]

## 2010-11-30 NOTE — Consult Note (Signed)
[facility]  Patient:    [name], JANUARY Visit Number: [id number] MRN: [id number]          Service Type: MED Location: 3A A336 01 Attending Physician:  Hilario Quarry Dictated by:   Tana Coast, P.A.-C. Proc. Date: 11/04/01 Admit Date:  11/02/2001   CC:         Dr. Christella Hartigan   Consultation Report  DATE OF BIRTH:  Dec 24, 1965  REASON FOR CONSULTATION:  Epigastric abdominal pain.  HISTORY OF PRESENT ILLNESS:  The patient is a pleasant 45 year old Caucasian female who was admitted two days ago with acute onset of abdominal pain.  The patient woke up at around 4 a.m. Monday morning with acute onset mid back pain shortly followed by nausea, vomiting, and diarrhea.  She then developed "stomach spasm" which persisted.  She had only two episodes of diarrhea but has had persistent nausea, vomiting, epigastric and periumbilical pain not relieved by acid suppression.  She has been on IV Pepcid 20 mg IV q.12h. initially and now on Prevacid 30 mg b.i.d. with no relief of her symptoms.  The patient reports that she is a social drinker, often drinking on most weekends; however, this past weekend she drank more heavily than normally, totalling approximately 18 beers.  She took one Goodys powder two days before the symptoms began; however, she generally does not take NSAIDs or aspirin.  She denies any dysphagia, odynophagia, or bright red blood per rectum.  The day before symptoms began, she noted that her stools were very dark and possibly black.  She was hemoccult negative in the emergency department.  She generally has a bowel movement daily.  She very infrequently has abdominal spasm related to increased stress.  Since hospitalization, she has complained of heartburn but really had none of these symptoms previously.  She complains of "a speck of blood" in her emesis yesterday.  In the emergency department, acute abdominal series was  negative.  Urinalysis revealed ketones greater than 80, protein 30, but otherwise unremarkable.  She had a negative HCG qualitative test.  Total bilirubin was 0.6, alkaline phosphatase 71, SGOT 20, SGPT 19, albumin 3.6, amylase 31, lipase 22.  WBC 6.2, hemoglobin 13.6, hematocrit 38, platelets 271,000.  Since admission, she has had CT of abdomen and pelvis which showed small ovarian follicles and a small to moderate amount of free fluid in the pelvis, probably secondary to ruptured cyst.  The patient continues to require Phenergan and Dilaudid for symptoms control. She received four doses of Dilaudid yesterday and has already received two doses today.  She vomited latest this morning and has already received Phenergan for this.  MEDICATIONS AT HOME:  Zantac x 1, Goody powder x 1.  CURRENT MEDICATIONS: 1. Phenergan 12.5 mg IV q.1h. p.r.n. 2. Dilaudid 2 mg IV q.4h. p.r.n. 3. Transdermal nicotine patch 14 mg q.d. p.r.n. 4. Normal saline 80 cc per hour with 20 mEq Kay-Ciel per liter. 5. Prevacid 30 mg p.o. b.i.d.  ALLERGIES:  No known drug allergies.  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  She reports being hospitalized eight years ago at [facility] for similar symptoms.  She was diagnosed with irritable bowel syndrome at that time.  She reports being hospitalized at [facility] three years ago for similar symptoms and was diagnosed with acid reflux and hiatal hernia.  She describes having upper GI series and abdominal  ultrasound.  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  FAMILY HISTORY:  There is no family history of cancer or liver disease to her knowledge.  Her mother and dad have both had peptic ulcer disease.  She has family history of hypertension and myocardial infarction.  SOCIAL HISTORY:  She is single and has no children.  She is employed with [employer] and has been for six  years.  She is a social drinker, usually on the weekends.  She has been smoking cigarettes since age 69 and currently smokes one pack per day.  REVIEW OF SYSTEMS:  Please see HPI for GI.  Genitourinary: Negative.  Her last menstrual period was 2003.  Respiratory: Complains of early morning congestion and cough related to cigarette smoking.  PHYSICAL EXAMINATION:  VITAL SIGNS:  Height 5 feet 4-1/2 inches, weight 138.9.  T-max 99.4, currently 99.5, pulse 69, respirations 20, blood pressure 157/97.  GENERAL:  A very pleasant, well-nourished, well-developed Caucasian female in no acute distress.  SKIN:  Warm and dry with no jaundice.  HEENT:  Pupils are equal, round and reactive to light.  Conjunctivae pink. Sclerae nonicteric.  Oropharyngeal mucosa pink without erythema or exudate.  NECK:  No lymphadenopathy, thyromegaly, or carotid bruits.  CHEST:  Lungs clear to auscultation.  CARDIAC:  Regular rate and rhythm.  Normal S1 and S2.  No murmurs, rubs, or gallops.  ABDOMEN:  Positive bowel sounds.  Soft, nondistended.  She has moderate tenderness in the epigastric region with deep palpation.  No organomegaly or masses.  RECTAL:  Examination performed in the ER by Dr. Rosalia Hammers.  She was hemoccult negative.  EXTREMITIES:  No cyanosis or edema.  LABORATORY DATA:  As mentioned in HPI.  In addition on November 03, 2001, WBC 8.6, hemoglobin 12.8, hematocrit 36, platelets 281,000.  Sodium 137, potassium 3.7, BUN 6, creatinine 0.7, glucose 96.  Acute abdominal series and CT of abdomen and pelvis are as mentioned in HPI.  IMPRESSION:  The patient is a pleasant 45 year old Caucasian female admitted with acute abdominal pain associated persistent nausea and vomiting and early limited diarrhea.  Initially, her presentation sounded consistent with acute gastroenteritis and may very well be related to this; however, the cause of persistent nausea, vomiting, and abdominal pain would be concern for  other  etiologies as well.  She does have moderate epigastric pain with deep palpation, questionable history of melena which would cause concern for possible peptic ulcer disease or even alcoholic gastritis.  Both irritable bowel syndrome and biliary etiology remain possibilities as well.  SUGGESTIONS: 1. EGD today. 2. Continue PPI therapy. 3. Further recommendations to follow EGD.  I would like to thank Dr. Christella Hartigan for allowing [place] to take part in the care of this patient. Dictated by:   Tana Coast, P.A.-C. Attending Physician:  Hilario Quarry DD:  11/04/01 TD:  11/04/01 Job: [id number]

## 2010-11-30 NOTE — Discharge Summary (Signed)
[facility]  Patient:    [name], [name] Visit Number: [id number] MRN: [id number]          Service Type: MED Location: 3A A336 01 Attending Physician:  Renne Musca Dictated by:   Lindwood Qua Date:  11/02/2001 Discharge Date: 11/05/2001   CC:         Lionel December, M.D.   Discharge Summary  CONSULTATIONS:  Dr. Karilyn Cota, gastroenterology.  DISCHARGE DIAGNOSES: 1. Abdominal pain felt secondary to superficial antral ulcers and gastritis    and duodenitis.  H. pylori titer is pending at this time. 2. Nausea and vomiting, much improved. 3. Abdominal cramps, much improved.  Discharged on Soma p.r.n. 4. Hypertension.  Discharged on low salt diet, exercise, and no alcohol at    this time. 5. Alcohol use and questionable abuse, but no evidence of any withdrawal    during this hospitalization. 6. Cigarette abuse.  Discussed in detail while hospitalized.  DISCHARGE MEDICATIONS: 1. Prevacid 30 mg p.o. q.d. 2. Soma tablets one p.o. q.8h. p.r.n. for abdominal cramping. 3. Phenergan 12.5 mg q.6h. p.r.n. for nausea and vomiting.  FOLLOWUP:  She will follow up with Dr. Karilyn Cota as scheduled.  She will follow up with primary M.D. of her choice in the next weeks to follow up on her blood pressure.  She wants to make her own appointment for follow-up.  DIET:  Low salt.  ACTIVITY:  As tolerated.  DISCHARGE INSTRUCTIONS:  She is to try to refrain from cigarette use and alcohol use.  HISTORY AND PHYSICAL:  Please refer to dictated history and physical.  LABORATORIES:  H. pylori titer is pending at this time.  CBC on admission showed normal white cell count 6.4, repeat 8.6, last platelet count 281,000, hemoglobin 12.8 last checked.  Liver function tests were normal.  Amylase and lipase were normal.  Metabolic panel was normal last checked.  BUN 6, creatinine 0.7, sodium 137, potassium 3.7.  Urinalysis was negative for any evidence of infection.  CT scan  was negative except for questionable mucosa ______ up the antrum of the stomach.  Pelvic CT was consistent with possible ruptured ovarian cyst with a small to moderate amount of fluid in the cul-de-sac.  Acute abdominal series was negative.  HOSPITAL COURSE:  #1 - ABDOMINAL PAIN:  On admission etiology of her abdominal pain was questionable.  Thought it might have been secondary to a viral syndrome, although peptic ulcer disease could not be ruled out.  She did not improve with simple observation and fluids and medications and her pain continued the next day.  GI consultation was obtained.  Dr. Karilyn Cota agreed that she would benefit from EGD.  EGD was done on November 04, 2001 which showed a small hiatal hernia, small ulcerations at the antrum of the stomach and biopsies were taken.  She had some duodenitis and gastritis.  We continued her protime pump inhibitor therapy throughout this hospitalization and she did improve.  She was able to take full liquids one day prior to discharge and today she is able to take a regular diet well.  She only required pain medications one time over the last 12 hours which is a significant improvement.  She will be discharged on protime pump inhibitor as noted above along with Phenergan p.r.n.  Will follow up with Dr. Karilyn Cota in the office.  #2 - HYPERTENSION:  She was noted to have slightly elevated blood pressure during this hospitalization with systolic at times in the 150s-160s.  I discussed  with her and have asked her to stay on a low salt diet and try to get some exercise and try to refrain from the alcohol.  #3 - ALCOHOL ABUSE:  There was no evidence of any withdrawal during this hospitalization.  The alcohol may certainly have been contributing to her gastritis.  #4 - PROBABLE RUPTURED OVARIAN CYST:  I do not think this was causing any of her epigastric symptoms.  Would simply have her follow up with a primary M.D. of her choice in the future for  these, but certainly no work-up now. Dictated by:   Christella Hartigan Attending Physician:  Renne Musca DD:  11/05/01 TD:  11/05/01 Job: [phone] [id number]

## 2010-11-30 NOTE — Discharge Summary (Signed)
NAMERAYSHELL, GOECKE                        ACCOUNT NO.:  [id number]   MEDICAL RECORD NO.:  [id number]                   PATIENT TYPE:  INP   LOCATION:  A407                                 FACILITY:  [facility]   PHYSICIAN:  Tilda Burrow, M.D.              DATE OF BIRTH:  Nov 26, 1965   DATE OF ADMISSION:  08/03/2003  DATE OF DISCHARGE:  08/04/2003                                 DISCHARGE SUMMARY   ADMISSION DIAGNOSES:  1. Third trimester bleeding.  2. Abruption versus placenta previa versus vasa previa with fetal distress.   DISCHARGE DIAGNOSES:  1. Third trimester bleeding.  2. Fetal distress secondary to vasa previa.   PROCEDURE:  Emergency primary low transverse cervical cesarean section,  August 03, 2003.   DISPOSITION:  Transfer, continuation of care at [facility].   HOSPITAL COURSE:  A 45 year old female at [redacted] weeks gestation as described in  the admitting history, dictated yesterday and who underwent an emergency  primary low transverse cesarean section after presenting with heavy vaginal  bleeding, found at the time of emergency C-section to be related to vasa  previa.  The baby was extremely moribund and had Apgar's of 1, 1 and 1 at 1,  5 and 10 minutes respectively and subsequently was transferred to [facility].  Due to deteriorating condition of the infant, mother is being  transferred to that hospital to be near the baby and for continuation of  postpartum care until the baby will be discharged.   At the time of discharge, hemoglobin 11.6, hematocrit 33.8.  White count  13,500, maternal blood type O positive, compared to admitting hemoglobin of  10.4, hematocrit 31.1, platelets 201, yesterday's blood sample.   FOLLOWUP:  The patient will be followed up after discharge from [facility] in one week in our office or earlier if needed by the patient.     ___________________________________________           Tilda Burrow, M.D.   JVF/MEDQ  D:  08/04/2003  T:  08/05/2003  Job:  [id number]

## 2010-11-30 NOTE — Op Note (Signed)
[name], [name]                        ACCOUNT NO.:  [id number]   MEDICAL RECORD NO.:  [id number]                   PATIENT TYPE:  AMB   LOCATION:  DAY                                  FACILITY:  [facility]   PHYSICIAN:  Barbaraann Barthel, M.D.              DATE OF BIRTH:  08-09-1965   DATE OF PROCEDURE:  11/16/2003  DATE OF DISCHARGE:                                 OPERATIVE REPORT   SURGEON:  Barbaraann Barthel, M.D.   PREOPERATIVE DIAGNOSIS:  Cholecystitis, cholelithiasis.   POSTOPERATIVE DIAGNOSIS:  Cholecystitis, cholelithiasis.   PROCEDURE:  Laparoscopic cholecystectomy.   SPECIMENS:  Gallbladder.   GROSS OPERATIVE FINDINGS:  The patient had multiple adhesions around the  gallbladder.  There were small grainy-like excretions within the gallbladder  and a questionable stone.  I did not feel any obvious typical gallstone  within it despite the sonogram's report.  The bile ducts and the right upper  quadrant otherwise were normal, except for the adhesions about the  gallbladder.  There was a small cystic duct which was not cannulated.  No  other abnormalities were encountered.   INDICATIONS FOR PROCEDURE:  This is a 45 year old white female who was  admitted with right upper quadrant pain, nausea, and vomiting.  Sonogram  revealed multiple small stones within the gallbladder.  Liver function  studies and amylase preoperatively were normal.  We discussed laparoscopic  cholecystectomy in detail with her, discussing the complications, not  limited to but including bleeding, infection, damage to bile ducts,  perforation of organs, and transitory diarrhea.  Informed consent was  obtained.   TECHNIQUE:  The patient was placed in the supine position.  After the  adequate administration of general anesthesia via endotracheal intubation,  her entire abdomen was prepped with Betadine solution and draped in the  usual manner.   A periumbilical incision was carried out over the  superior aspect of the  umbilicus.  With the patient in Trendelenburg, a Veress needle was inserted,  and the abdomen was insufflated to approximately 3.2 L with CO2.  Obviously  prior to this, a Foley catheter was aseptically inserted.  With the patient  in the upright position, three other cannulas were placed, an 11-mm cannula  in the epigastrium and two 5-mm cannulas in the right upper quadrant  laterally.  The gallbladder was grasped.  Its adhesions were taken down.  The cystic duct was clearly identified, triply silver clipped on the side of  the common bile duct, singly silver clipped on the side of the gallbladder,  and divided.  The cystic artery was likewise identified, triply silver  clipped, and divided.  The gallbladder was then removed from the liver bed  uneventfully using the hook cautery device.  There was a minimal amount of  oozing.  This was easily controlled with the hook cautery device.  I elected  to leave one piece of Surgicel within the liver bed.  After irrigating and  checking for hemostasis, we removed the gallbladder from the epigastric  incision using the EndoCatch device.  After checking again for hemostasis,  we irrigated and desufflated the abdomen and closed the incisions.  We  closed the 11-mm cannula sites in the epigastrium and the umbilicus with 0  Polysorb and used surgiclips to close all of the skin incisions.  We used  0.5% Sensorcaine around the incision sites to help with postoperative  comfort.  Prior to closure, all sponge, needle, and instrument counts were  found to be correct.  Estimated blood loss was minimal.  The patient  received 1200 cc of crystalloid intraoperatively.  There were no  complications.      ___________________________________________                                            Barbaraann Barthel, M.D.   WB/MEDQ  D:  11/16/2003  T:  11/16/2003  Job:  [id number]   cc:   Tilda Burrow, M.D.  [address] [place]  [place] [address]  Fax: [phone]

## 2010-11-30 NOTE — H&P (Signed)
[facility]  Patient:    [name], [name] Visit Number: [id number] MRN: [id number]          Service Type: MED Location: 3A A336 01 Attending Physician:  Hilario Quarry Dictated by:   Renne Musca, M.D. Admit Date:  11/02/2001                           History and Physical  CHIEF COMPLAINT:  The patient is a 45 year old healthy white female, who was in her usual state of health until early this morning when she awakened with low back pain that was actually mid back.  HISTORY OF PRESENT ILLNESS: She described it as aching.  She apparently rapidly thereafter developed acute nausea, vomiting, and then diarrhea.  The nausea and vomiting persisted most of the day and she could not maintain any p.o.s.  She took one over-the-counter Zantac to no avail.  She apparently began to hyperventilate as she described numbness of her hands and then presented to the emergency room.  There was no history of hematemesis, no previous history, no contacts with similar symptoms.  No fever, chills.  She was hospitalized eight years ago with similar scenario at [facility] and diagnosed with what sounds like irritable bowel and hiatal hernia. Apparently she had an upper GI done at that time.  Approximately 2-1/2 years ago she was evaluated at [facility] (unclear if inpatient or outpatient) and reportedly had an ultrasound performed and was told that she had reflux.  However, todays episode was much more severe than either of those.  She does note occasionally when she gets nervous she will have cramping in her abdomen but denies diarrhea and the episodes described above were during times of extreme stress.  However, at this time she states she has no stressors and actually feels great usually.  The patient drinks socially and apparently had more than usual over the weekend on Friday and Saturday, up to six to eight beers per day, but does not drink  routinely.  She smokes one pack of cigarettes per day and has done so since age 33.  She denies any over-the-counter medicines, supplements, or prescription medications.  She is single.  No children.  Gravida 1 para 0 A 0.  Menses are normal.  She just completed menses and her pregnancy test in the emergency room is negative.  No other GI complaints except for occasional heartburn but this is rare.  No GU complaints.  No cardiovascular history or complaints.  FAMILY HISTORY:  Pertinent for hypertension, strokes.  No cancer as far as she knows.  She has one sister who is alive and well.  Her parents are still living.  SOCIAL HISTORY:  She is employed as an Materials engineer.  PHYSICAL EXAMINATION:  VITAL SIGNS:  Blood pressure 130/90, pulse 74.  HEART:  Regular.  No murmur, gallop, or rub.  LUNGS:  With inspiratory wheeze bilaterally posteriorly noted.  ABDOMEN:  Soft, positive bowel sounds noted.  She has some mild epigastric diffuse tenderness.  No rebound.  No masses.  RECTAL:  Examination per Dr. Rosalia Hammers was heme negative.  No mass.  SKIN:  Without rash or lesion.  NEUROLOGIC:  Intact.  PAST MEDICAL HISTORY:  As noted above.  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  No prolonged illnesses or other medical problems.  PAST SURGICAL HISTORY:  No surgeries.  LABORATORY DATA:  WBC 6.8, 92% segs, 0% bands.  Electrolytes within normal limits except for mildly depleted potassium of 3.4.  Amylase and lipase are both normal.  ASSESSMENT/PLAN:  1. Acute abdominal pain with intractable nausea and vomiting.  Given the     history that sounds more like a viral syndrome, although peptic ulcer     disease and biliary colic cannot be ruled out.  Laboratories are normal     and makes this unlikely.  She is quite comfortable at this time after     having Phenergan and 4 mg total of Dilaudid.  Will continue intravenous     fluids.  Clear liquid diet and  advance as tolerates.  To consider     ultrasound in a.m., although yield would be low.  2. Hypokalemia.  Most likely related to persistent vomiting.  Replete with     intravenous potassium.  3. Tobacco abuse.  Patient counseled on need for cessation.  A Nicoderm     patch will be available should she desire.  4. Continue intravenous Pepcid at this time.  5. Further work-up as indicated.  The patient has been appraised of her     condition and the plan, and is agreeable to proceed. Dictated by:   Renne Musca, M.D. Attending Physician:  Hilario Quarry DD:  11/02/01 TD:  11/03/01 Job: [id number]

## 2011-04-04 LAB — CBC
HCT: 32.2 — ABNORMAL LOW
Hemoglobin: 11.3 — ABNORMAL LOW
MCHC: 35.1
RDW: 13.3

## 2011-04-04 LAB — POCT URINALYSIS DIP (DEVICE)
Hgb urine dipstick: NEGATIVE
Hgb urine dipstick: NEGATIVE
Nitrite: NEGATIVE
Nitrite: NEGATIVE
Protein, ur: NEGATIVE
Urobilinogen, UA: 0.2
Urobilinogen, UA: 0.2
pH: 6
pH: 5.5

## 2011-04-04 LAB — URINALYSIS, ROUTINE W REFLEX MICROSCOPIC
Bilirubin Urine: NEGATIVE
Glucose, UA: NEGATIVE
Ketones, ur: NEGATIVE
Nitrite: NEGATIVE
Protein, ur: NEGATIVE
pH: 5.5

## 2011-04-05 LAB — POCT URINALYSIS DIP (DEVICE)
Bilirubin Urine: NEGATIVE
Bilirubin Urine: NEGATIVE
Hgb urine dipstick: NEGATIVE
Hgb urine dipstick: NEGATIVE
Hgb urine dipstick: NEGATIVE
Nitrite: NEGATIVE
Nitrite: NEGATIVE
Nitrite: NEGATIVE
Urobilinogen, UA: 0.2
Urobilinogen, UA: 0.2
pH: 6
pH: 5.5
pH: 6

## 2011-04-08 LAB — POCT URINALYSIS DIP (DEVICE)
Bilirubin Urine: NEGATIVE
Bilirubin Urine: NEGATIVE
Bilirubin Urine: NEGATIVE
Bilirubin Urine: NEGATIVE
Glucose, UA: NEGATIVE
Hgb urine dipstick: NEGATIVE
Hgb urine dipstick: NEGATIVE
Hgb urine dipstick: NEGATIVE
Nitrite: NEGATIVE
Nitrite: NEGATIVE
Operator id: 159681
Specific Gravity, Urine: 1.02
Specific Gravity, Urine: 1.015
Specific Gravity, Urine: 1.015
pH: 5.5
pH: 5
pH: 5.5
pH: 5.5

## 2011-04-09 LAB — POCT URINALYSIS DIP (DEVICE)
Bilirubin Urine: NEGATIVE
Bilirubin Urine: NEGATIVE
Bilirubin Urine: NEGATIVE
Glucose, UA: NEGATIVE
Nitrite: NEGATIVE
Nitrite: NEGATIVE
Nitrite: NEGATIVE
Nitrite: NEGATIVE
Operator id: 194561
Operator id: 194561
Operator id: 194561
Protein, ur: NEGATIVE
Protein, ur: NEGATIVE
Protein, ur: NEGATIVE
pH: 5.5
pH: 5.5
pH: 5
pH: 5.5

## 2011-04-09 LAB — CBC
Platelets: 289
RBC: 3.57 — ABNORMAL LOW
RDW: 14
WBC: 11.7 — ABNORMAL HIGH

## 2011-04-09 LAB — RPR: RPR Ser Ql: NONREACTIVE

## 2011-04-15 LAB — URINALYSIS, ROUTINE W REFLEX MICROSCOPIC
Bilirubin Urine: NEGATIVE
Glucose, UA: NEGATIVE
Nitrite: NEGATIVE
Specific Gravity, Urine: >1.03 — ABNORMAL HIGH
pH: 5.5

## 2011-04-15 LAB — CBC
HCT: 39.3
Hemoglobin: 13.4
Hemoglobin: 13.6
MCHC: 34.5
MCV: 96.8
Platelets: 240
RDW: 14
RDW: 14.4

## 2011-04-15 LAB — CARDIAC PANEL(CRET KIN+CKTOT+MB+TROPI)
CK, MB: 1.5
Relative Index: 1.1
Relative Index: 1.2
Total CK: 108
Total CK: 130
Troponin I: 0.02

## 2011-04-15 LAB — DIFFERENTIAL
Basophils Absolute: 0
Basophils Relative: 0
Lymphocytes Relative: 10 — ABNORMAL LOW
Lymphs Abs: 0.8
Monocytes Relative: 3
Neutro Abs: 6.1
Neutro Abs: 6.9
Neutrophils Relative %: 86 — ABNORMAL HIGH
Neutrophils Relative %: 88 — ABNORMAL HIGH

## 2011-04-15 LAB — BLOOD GAS, ARTERIAL
Patient temperature: 37
pH, Arterial: 7.4

## 2011-04-15 LAB — BASIC METABOLIC PANEL
GFR calc non Af Amer: >60
Glucose, Bld: 140 — ABNORMAL HIGH
Potassium: 3.9
Sodium: 137

## 2011-04-15 LAB — COMPREHENSIVE METABOLIC PANEL
Alkaline Phosphatase: 80
BUN: 13
Calcium: 8.9
Glucose, Bld: 150 — ABNORMAL HIGH
Total Protein: 7.2

## 2011-04-15 LAB — PROTIME-INR
INR: 1.1
Prothrombin Time: 14.1

## 2011-04-15 LAB — MAGNESIUM: Magnesium: 2

## 2011-04-15 LAB — PHOSPHORUS: Phosphorus: 2.3

## 2011-04-23 LAB — URINALYSIS, ROUTINE W REFLEX MICROSCOPIC
Bilirubin Urine: NEGATIVE
Ketones, ur: 15 — AB
Nitrite: NEGATIVE
Protein, ur: NEGATIVE

## 2011-04-25 LAB — CBC
HCT: 37.3
Hemoglobin: 12.8
MCV: 95.2
Platelets: 216
WBC: 5.3

## 2011-04-25 LAB — DIFFERENTIAL
Basophils Absolute: 0
Basophils Relative: 0
Lymphocytes Relative: 16
Monocytes Absolute: 0.3
Neutro Abs: 4.1
Neutrophils Relative %: 78 — ABNORMAL HIGH

## 2011-04-25 LAB — URINALYSIS, ROUTINE W REFLEX MICROSCOPIC
Bilirubin Urine: NEGATIVE
Ketones, ur: NEGATIVE
Nitrite: NEGATIVE
Urobilinogen, UA: 0.2
pH: 6

## 2011-04-25 LAB — COMPREHENSIVE METABOLIC PANEL
Albumin: 3.4 — ABNORMAL LOW
Alkaline Phosphatase: 80
BUN: 8
Chloride: 99
Creatinine, Ser: 0.57
GFR calc non Af Amer: >60
Glucose, Bld: 104 — ABNORMAL HIGH
Potassium: 3.5
Total Bilirubin: 0.3

## 2011-05-06 ENCOUNTER — Other Ambulatory Visit (HOSPITAL_COMMUNITY): Payer: Self-pay | Admitting: Internal Medicine

## 2011-05-06 DIAGNOSIS — Z139 Encounter for screening, unspecified: Secondary | ICD-10-CM

## 2011-05-10 ENCOUNTER — Ambulatory Visit (HOSPITAL_COMMUNITY): Payer: Medicaid Other

## 2011-05-14 ENCOUNTER — Ambulatory Visit (HOSPITAL_COMMUNITY): Payer: Medicaid Other

## 2011-05-27 ENCOUNTER — Ambulatory Visit (HOSPITAL_COMMUNITY)
Admission: RE | Admit: 2011-05-27 | Discharge: 2011-05-27 | Disposition: A | Discharge status: Home / Self Care | Payer: Medicaid Other | Source: Ambulatory Visit | Attending: Internal Medicine | Admitting: Internal Medicine

## 2011-05-27 DIAGNOSIS — Z139 Encounter for screening, unspecified: Secondary | ICD-10-CM

## 2011-05-27 DIAGNOSIS — Z1231 Encounter for screening mammogram for malignant neoplasm of breast: Secondary | ICD-10-CM | POA: Insufficient documentation

## 2011-07-05 ENCOUNTER — Emergency Department (HOSPITAL_COMMUNITY): Payer: Medicaid Other

## 2011-07-05 ENCOUNTER — Encounter: Payer: Self-pay | Admitting: *Deleted

## 2011-07-05 ENCOUNTER — Emergency Department (HOSPITAL_COMMUNITY)
Admission: EM | Admit: 2011-07-05 | Discharge: 2011-07-06 | Disposition: A | Discharge status: Home / Self Care | Payer: Medicaid Other | Attending: Emergency Medicine | Admitting: Emergency Medicine

## 2011-07-05 DIAGNOSIS — I10 Essential (primary) hypertension: Secondary | ICD-10-CM | POA: Insufficient documentation

## 2011-07-05 DIAGNOSIS — J45901 Unspecified asthma with (acute) exacerbation: Secondary | ICD-10-CM

## 2011-07-05 DIAGNOSIS — R109 Unspecified abdominal pain: Secondary | ICD-10-CM | POA: Insufficient documentation

## 2011-07-05 DIAGNOSIS — J45909 Unspecified asthma, uncomplicated: Secondary | ICD-10-CM | POA: Insufficient documentation

## 2011-07-05 DIAGNOSIS — Z87891 Personal history of nicotine dependence: Secondary | ICD-10-CM | POA: Insufficient documentation

## 2011-07-05 DIAGNOSIS — M545 Low back pain, unspecified: Secondary | ICD-10-CM | POA: Insufficient documentation

## 2011-07-05 HISTORY — DX: Chronic obstructive pulmonary disease, unspecified: J44.9

## 2011-07-05 HISTORY — DX: Essential (primary) hypertension: I10

## 2011-07-05 LAB — URINE MICROSCOPIC-ADD ON

## 2011-07-05 LAB — URINALYSIS, ROUTINE W REFLEX MICROSCOPIC
Leukocytes, UA: NEGATIVE
Nitrite: NEGATIVE
Protein, ur: NEGATIVE mg/dL
Specific Gravity, Urine: 1.015 (ref 1.005–1.030)
Urobilinogen, UA: 0.2 mg/dL (ref 0.0–1.0)

## 2011-07-05 MED ORDER — HYDROMORPHONE HCL PF 1 MG/ML IJ SOLN
1.0000 mg | Freq: Once | INTRAMUSCULAR | Status: AC
  Administered 2011-07-05: 1 mg via INTRAVENOUS
  Filled 2011-07-05: qty 1

## 2011-07-05 MED ORDER — SODIUM CHLORIDE 0.9 % IV SOLN
Freq: Once | INTRAVENOUS | Status: AC
  Administered 2011-07-05: 22:00:00 via INTRAVENOUS

## 2011-07-05 MED ORDER — IPRATROPIUM BROMIDE 0.02 % IN SOLN
0.5000 mg | Freq: Once | RESPIRATORY_TRACT | Status: AC
  Administered 2011-07-05: 0.5 mg via RESPIRATORY_TRACT
  Filled 2011-07-05: qty 2.5

## 2011-07-05 MED ORDER — ALBUTEROL SULFATE (5 MG/ML) 0.5% IN NEBU
5.0000 mg | INHALATION_SOLUTION | Freq: Once | RESPIRATORY_TRACT | Status: AC
  Administered 2011-07-05: 5 mg via RESPIRATORY_TRACT
  Filled 2011-07-05: qty 1

## 2011-07-05 MED ORDER — KETOROLAC TROMETHAMINE 30 MG/ML IJ SOLN
30.0000 mg | Freq: Once | INTRAMUSCULAR | Status: AC
  Administered 2011-07-05: 30 mg via INTRAVENOUS
  Filled 2011-07-05: qty 1

## 2011-07-05 NOTE — ED Notes (Addendum)
Pt c/o cough wheezing that started about 3-4 days ago, has been using home nebs at home without any improvement, pt also c/o left side flank pain that radiates across entire back area and radiates down left hip, pt states that the pain started about 3 weeks ago but has gotten worse over the past day or two, denies any fever, n/v, pt does admit that she has been having dizzy spills, pt states that she has had increase in urinary frequency, and states she will urinate and then still like she has to urinate,

## 2011-07-05 NOTE — ED Notes (Signed)
Pt reports increased sob and wheezing for the past couple of days, pt also reports pain in back

## 2011-07-05 NOTE — ED Provider Notes (Signed)
History    Scribed for Geoffery Lyons, MD, the patient was seen in room APA15/APA15 . This chart was scribed by Ellie Lunch.   CSN: [id number]  Arrival date & time 07/05/11  2115   First MD Initiated Contact with Patient 07/05/11 2154      Chief Complaint  Patient presents with  . Wheezing    (Consider location/radiation/quality/duration/timing/severity/associated sxs/prior treatment) HPI [name] is a 45 y.o. female who presents to the Emergency Department complaining of severe back pain described as lower back on the left. Pt describes pain as being like a "fist" in her back. Associated symptoms include SOB and frequent urination. Pain worsens with movement and deep breaths. Pt has hx of asthma and lower back problems but claims this pain is not similar to usual back pain. Pt denies history of kidney stone but has family hx of kidney stones.   Past Medical History  Diagnosis Date  . Asthma   . COPD (chronic obstructive pulmonary disease)   . Hypertension     Past Surgical History  Procedure Date  . Cholecystectomy   . Cesarean section   . Carpal tunnel release     No family history on file.  History  Substance Use Topics  . Smoking status: Former Games developer  . Smokeless tobacco: Not on file  . Alcohol Use: Yes     Review of Systems 10 Systems reviewed and are negative for acute change except as noted in the HPI.   Allergies  Review of patient's allergies indicates no known allergies.  Home Medications   Current Outpatient Rx  Name Route Sig Dispense Refill  . ALBUTEROL SULFATE HFA 108 (90 BASE) MCG/ACT IN AERS Inhalation Inhale 2 puffs into the lungs every 6 (six) hours as needed.      . ALBUTEROL SULFATE (5 MG/ML) 0.5% IN NEBU Nebulization Take 2.5 mg by nebulization every 6 (six) hours as needed.      Marland Kitchen AMLODIPINE BESYLATE 5 MG PO TABS Oral Take 5 mg by mouth daily.      . BUPROPION HCL 100 MG PO TABS Oral Take 100 mg by mouth 1 day or 1 dose.        Marland Kitchen LABETALOL HCL 200 MG PO TABS Oral Take 200 mg by mouth 2 (two) times daily.      Marland Kitchen MIRTAZAPINE 15 MG PO TABS Oral Take 15 mg by mouth at bedtime.        BP 156/99  Pulse 88  Temp(Src) 98 F (36.7 C) (Oral)  Resp 22  Ht 5\' 4"  (1.626 m)  Wt 165 lb (74.844 kg)  BMI 28.32 kg/m2  SpO2 100%  Physical Exam  Nursing note and vitals reviewed. Constitutional: She is oriented to person, place, and time. She appears well-developed and well-nourished. She appears distressed.  HENT:  Head: Normocephalic and atraumatic.  Right Ear: External ear normal.  Left Ear: External ear normal.  Mouth/Throat: Oropharynx is clear and moist.  Eyes: Pupils are equal, round, and reactive to light.  Cardiovascular: Normal rate, regular rhythm and normal heart sounds.   Pulmonary/Chest: Effort normal. She has wheezes (wheezing throughout).  Abdominal: Soft. There is tenderness.       Tender in left CVA  Neurological: She is alert and oriented to person, place, and time.  Skin: Skin is warm and dry.    ED Course  Procedures (including critical care time) DIAGNOSTIC STUDIES: Oxygen Saturation is 100% on RA, nml by my interpretation.    COORDINATION OF  CARE:     Labs Reviewed  URINALYSIS, ROUTINE W REFLEX MICROSCOPIC   No results found.   No diagnosis found.    MDM  No abnormality found on ct or ua.  I am unsure as to why the patient is having the degree of pain she is having, but it does not appear to be anything emergent.  Will discharge with a limited supply of pain meds.  Needs to return if symptoms or condition change.       I personally performed the services described in this documentation, which was scribed in my presence. The recorded information has been reviewed and considered.     Geoffery Lyons, MD 07/06/11 [phone]

## 2011-07-05 NOTE — ED Notes (Signed)
Pt states that her pain medicine did not work, Dr. Judd Lien notified, additional orders given

## 2011-07-05 NOTE — ED Notes (Signed)
Patient just arrived back from ct.

## 2011-07-06 MED ORDER — OXYCODONE-ACETAMINOPHEN 5-325 MG PO TABS: 1.0000 | ORAL_TABLET | Freq: Four times a day (QID) | ORAL | Status: AC | PRN

## 2011-07-06 NOTE — ED Notes (Signed)
Pt waiting on ride

## 2011-07-27 ENCOUNTER — Emergency Department (HOSPITAL_COMMUNITY): Payer: Medicaid Other

## 2011-07-27 ENCOUNTER — Encounter (HOSPITAL_COMMUNITY): Payer: Self-pay | Admitting: *Deleted

## 2011-07-27 ENCOUNTER — Inpatient Hospital Stay (HOSPITAL_COMMUNITY)
Admission: EM | Admit: 2011-07-27 | Discharge: 2011-07-31 | DRG: 190 | Disposition: A | Discharge status: Home / Self Care | Payer: Medicaid Other | Attending: Internal Medicine | Admitting: Internal Medicine

## 2011-07-27 DIAGNOSIS — I10 Essential (primary) hypertension: Secondary | ICD-10-CM | POA: Diagnosis present

## 2011-07-27 DIAGNOSIS — G894 Chronic pain syndrome: Secondary | ICD-10-CM | POA: Diagnosis present

## 2011-07-27 DIAGNOSIS — J45909 Unspecified asthma, uncomplicated: Secondary | ICD-10-CM | POA: Diagnosis present

## 2011-07-27 DIAGNOSIS — K2100 Gastro-esophageal reflux disease with esophagitis, without bleeding: Secondary | ICD-10-CM | POA: Diagnosis present

## 2011-07-27 DIAGNOSIS — J449 Chronic obstructive pulmonary disease, unspecified: Secondary | ICD-10-CM | POA: Diagnosis present

## 2011-07-27 DIAGNOSIS — K2960 Other gastritis without bleeding: Secondary | ICD-10-CM | POA: Diagnosis present

## 2011-07-27 DIAGNOSIS — K219 Gastro-esophageal reflux disease without esophagitis: Secondary | ICD-10-CM | POA: Diagnosis present

## 2011-07-27 DIAGNOSIS — F411 Generalized anxiety disorder: Secondary | ICD-10-CM | POA: Diagnosis present

## 2011-07-27 DIAGNOSIS — K2210 Ulcer of esophagus without bleeding: Secondary | ICD-10-CM | POA: Diagnosis present

## 2011-07-27 DIAGNOSIS — Q391 Atresia of esophagus with tracheo-esophageal fistula: Secondary | ICD-10-CM

## 2011-07-27 DIAGNOSIS — F17200 Nicotine dependence, unspecified, uncomplicated: Secondary | ICD-10-CM | POA: Diagnosis present

## 2011-07-27 DIAGNOSIS — J45901 Unspecified asthma with (acute) exacerbation: Principal | ICD-10-CM | POA: Diagnosis present

## 2011-07-27 DIAGNOSIS — F419 Anxiety disorder, unspecified: Secondary | ICD-10-CM | POA: Diagnosis present

## 2011-07-27 DIAGNOSIS — J441 Chronic obstructive pulmonary disease with (acute) exacerbation: Principal | ICD-10-CM | POA: Diagnosis present

## 2011-07-27 HISTORY — DX: Anxiety disorder, unspecified: F41.9

## 2011-07-27 HISTORY — DX: Major depressive disorder, single episode, unspecified: F32.9

## 2011-07-27 HISTORY — DX: Chronic pain syndrome: G89.4

## 2011-07-27 HISTORY — DX: Gastro-esophageal reflux disease without esophagitis: K21.9

## 2011-07-27 HISTORY — DX: Depression, unspecified: F32.A

## 2011-07-27 LAB — CBC
HCT: 38.6 % (ref 36.0–46.0)
MCH: 31.8 pg (ref 26.0–34.0)
MCHC: 33.2 g/dL (ref 30.0–36.0)
MCV: 95.8 fL (ref 78.0–100.0)
RDW: 12.8 % (ref 11.5–15.5)

## 2011-07-27 MED ORDER — SODIUM CHLORIDE 0.9 % IV SOLN
INTRAVENOUS | Status: DC
  Administered 2011-07-27 – 2011-07-28 (×2): via INTRAVENOUS

## 2011-07-27 MED ORDER — ONDANSETRON HCL 4 MG/2ML IJ SOLN
4.0000 mg | Freq: Once | INTRAMUSCULAR | Status: AC
  Administered 2011-07-27: 4 mg via INTRAVENOUS
  Filled 2011-07-27: qty 2

## 2011-07-27 MED ORDER — HYDROMORPHONE HCL PF 1 MG/ML IJ SOLN
1.0000 mg | Freq: Once | INTRAMUSCULAR | Status: AC
  Administered 2011-07-27: 1 mg via INTRAVENOUS
  Filled 2011-07-27: qty 1

## 2011-07-27 MED ORDER — METHYLPREDNISOLONE SODIUM SUCC 125 MG IJ SOLR
125.0000 mg | Freq: Once | INTRAMUSCULAR | Status: AC
  Administered 2011-07-27: 125 mg via INTRAVENOUS
  Filled 2011-07-27: qty 2

## 2011-07-27 MED ORDER — IPRATROPIUM BROMIDE 0.02 % IN SOLN
0.5000 mg | Freq: Once | RESPIRATORY_TRACT | Status: AC
  Administered 2011-07-27: 0.5 mg via RESPIRATORY_TRACT
  Filled 2011-07-27: qty 2.5

## 2011-07-27 MED ORDER — ALBUTEROL SULFATE (5 MG/ML) 0.5% IN NEBU
5.0000 mg | INHALATION_SOLUTION | Freq: Once | RESPIRATORY_TRACT | Status: AC
  Administered 2011-07-27: 5 mg via RESPIRATORY_TRACT
  Filled 2011-07-27: qty 1

## 2011-07-27 NOTE — ED Notes (Signed)
Pt states has a history of asthma, pt presents with SOB, and inspiratory wheezing. SAO2 92% on room air. Pt placed in room 2 and placed on [place] of 2 LPM. Pt reports using nebulizer treatments at home all day without relief.

## 2011-07-28 ENCOUNTER — Encounter (HOSPITAL_COMMUNITY): Payer: Self-pay | Admitting: Internal Medicine

## 2011-07-28 DIAGNOSIS — J45901 Unspecified asthma with (acute) exacerbation: Principal | ICD-10-CM | POA: Diagnosis present

## 2011-07-28 DIAGNOSIS — I10 Essential (primary) hypertension: Secondary | ICD-10-CM | POA: Diagnosis present

## 2011-07-28 DIAGNOSIS — G894 Chronic pain syndrome: Secondary | ICD-10-CM | POA: Diagnosis present

## 2011-07-28 DIAGNOSIS — K219 Gastro-esophageal reflux disease without esophagitis: Secondary | ICD-10-CM | POA: Diagnosis present

## 2011-07-28 DIAGNOSIS — F419 Anxiety disorder, unspecified: Secondary | ICD-10-CM | POA: Diagnosis present

## 2011-07-28 DIAGNOSIS — J449 Chronic obstructive pulmonary disease, unspecified: Secondary | ICD-10-CM | POA: Diagnosis present

## 2011-07-28 DIAGNOSIS — J45909 Unspecified asthma, uncomplicated: Secondary | ICD-10-CM | POA: Diagnosis present

## 2011-07-28 DIAGNOSIS — J441 Chronic obstructive pulmonary disease with (acute) exacerbation: Principal | ICD-10-CM | POA: Diagnosis present

## 2011-07-28 LAB — BASIC METABOLIC PANEL
BUN: 12 mg/dL (ref 6–23)
Calcium: 9.6 mg/dL (ref 8.4–10.5)
Chloride: 105 mEq/L (ref 96–112)
Creatinine, Ser: 0.57 mg/dL (ref 0.50–1.10)
GFR calc Af Amer: >90 mL/min (ref >90)
GFR calc non Af Amer: >90 mL/min (ref >90)

## 2011-07-28 LAB — URINE MICROSCOPIC-ADD ON

## 2011-07-28 LAB — URINALYSIS, ROUTINE W REFLEX MICROSCOPIC
Bilirubin Urine: NEGATIVE
Nitrite: NEGATIVE
Specific Gravity, Urine: >1.03 — ABNORMAL HIGH (ref 1.005–1.030)
Urobilinogen, UA: 0.2 mg/dL (ref 0.0–1.0)

## 2011-07-28 LAB — GLUCOSE, CAPILLARY: Glucose-Capillary: 158 mg/dL — ABNORMAL HIGH (ref 70–99)

## 2011-07-28 LAB — HEPATIC FUNCTION PANEL
ALT: 14 U/L (ref 0–35)
AST: 14 U/L (ref 0–37)
Albumin: 3.6 g/dL (ref 3.5–5.2)
Total Protein: 7.5 g/dL (ref 6.0–8.3)

## 2011-07-28 LAB — HEMOGLOBIN A1C
Hgb A1c MFr Bld: 5.4 % (ref <5.7)
Mean Plasma Glucose: 108 mg/dL (ref <117)

## 2011-07-28 MED ORDER — HYDROMORPHONE HCL PF 2 MG/ML IJ SOLN
INTRAMUSCULAR | Status: AC
  Administered 2011-07-28: 1 mg
  Filled 2011-07-28: qty 1

## 2011-07-28 MED ORDER — OMEGA-3-ACID ETHYL ESTERS 1 G PO CAPS
2.0000 | ORAL_CAPSULE | ORAL | Status: DC
  Administered 2011-07-28 – 2011-07-31 (×4): 2 g via ORAL
  Filled 2011-07-28: qty 1
  Filled 2011-07-28 (×3): qty 2

## 2011-07-28 MED ORDER — ALBUTEROL SULFATE (5 MG/ML) 0.5% IN NEBU: 2.5000 mg | INHALATION_SOLUTION | RESPIRATORY_TRACT | Status: DC | PRN

## 2011-07-28 MED ORDER — METOCLOPRAMIDE HCL 10 MG PO TABS
10.0000 mg | ORAL_TABLET | Freq: Three times a day (TID) | ORAL | Status: DC
  Administered 2011-07-28 – 2011-07-31 (×11): 10 mg via ORAL
  Filled 2011-07-28 (×11): qty 1

## 2011-07-28 MED ORDER — BENZONATATE 100 MG PO CAPS
200.0000 mg | ORAL_CAPSULE | Freq: Three times a day (TID) | ORAL | Status: DC | PRN
  Administered 2011-07-28: 200 mg via ORAL
  Filled 2011-07-28: qty 2

## 2011-07-28 MED ORDER — ALBUTEROL SULFATE (5 MG/ML) 0.5% IN NEBU
2.5000 mg | INHALATION_SOLUTION | RESPIRATORY_TRACT | Status: DC
  Administered 2011-07-28 – 2011-07-31 (×17): 2.5 mg via RESPIRATORY_TRACT
  Filled 2011-07-28 (×16): qty 0.5

## 2011-07-28 MED ORDER — ALUM & MAG HYDROXIDE-SIMETH 200-200-20 MG/5ML PO SUSP
30.0000 mL | ORAL | Status: DC | PRN
  Administered 2011-07-28 – 2011-07-29 (×4): 30 mL via ORAL
  Filled 2011-07-28 (×4): qty 30

## 2011-07-28 MED ORDER — POTASSIUM CHLORIDE IN NACL 20-0.9 MEQ/L-% IV SOLN
INTRAVENOUS | Status: DC
  Administered 2011-07-28: 1000 mL via INTRAVENOUS

## 2011-07-28 MED ORDER — ACETAMINOPHEN 325 MG PO TABS: 650.0000 mg | ORAL_TABLET | ORAL | Status: DC | PRN

## 2011-07-28 MED ORDER — TRAZODONE HCL 50 MG PO TABS: 25.0000 mg | ORAL_TABLET | Freq: Every evening | ORAL | Status: DC | PRN

## 2011-07-28 MED ORDER — METOCLOPRAMIDE HCL 10 MG PO TABS: 10.0000 mg | ORAL_TABLET | Freq: Every day | ORAL | Status: DC

## 2011-07-28 MED ORDER — OXYCODONE HCL 5 MG PO TABS
5.0000 mg | ORAL_TABLET | ORAL | Status: DC | PRN
  Administered 2011-07-28 – 2011-07-31 (×2): 5 mg via ORAL
  Filled 2011-07-28 (×2): qty 1

## 2011-07-28 MED ORDER — ALBUTEROL SULFATE (5 MG/ML) 0.5% IN NEBU
5.0000 mg | INHALATION_SOLUTION | Freq: Once | RESPIRATORY_TRACT | Status: AC
  Administered 2011-07-28: 5 mg via RESPIRATORY_TRACT
  Filled 2011-07-28: qty 1

## 2011-07-28 MED ORDER — METHYLPREDNISOLONE SODIUM SUCC 125 MG IJ SOLR
80.0000 mg | Freq: Four times a day (QID) | INTRAMUSCULAR | Status: DC
  Administered 2011-07-28 – 2011-07-29 (×5): 80 mg via INTRAVENOUS
  Filled 2011-07-28 (×2): qty 2
  Filled 2011-07-28: qty 4
  Filled 2011-07-28: qty 2

## 2011-07-28 MED ORDER — HYDROMORPHONE HCL PF 1 MG/ML IJ SOLN
0.5000 mg | INTRAMUSCULAR | Status: DC | PRN
  Administered 2011-07-28: 0.5 mg via INTRAVENOUS
  Filled 2011-07-28: qty 1

## 2011-07-28 MED ORDER — PANTOPRAZOLE SODIUM 40 MG IV SOLR: 40.0000 mg | Freq: Once | INTRAVENOUS | Status: DC

## 2011-07-28 MED ORDER — FLUTICASONE PROPIONATE HFA 44 MCG/ACT IN AERO
2.0000 | INHALATION_SPRAY | Freq: Two times a day (BID) | RESPIRATORY_TRACT | Status: DC
  Administered 2011-07-28 – 2011-07-31 (×7): 2 via RESPIRATORY_TRACT
  Filled 2011-07-28 (×2): qty 10.6

## 2011-07-28 MED ORDER — CYCLOBENZAPRINE HCL 10 MG PO TABS
10.0000 mg | ORAL_TABLET | Freq: Three times a day (TID) | ORAL | Status: DC | PRN
  Filled 2011-07-28: qty 1

## 2011-07-28 MED ORDER — PANTOPRAZOLE SODIUM 40 MG PO TBEC
40.0000 mg | DELAYED_RELEASE_TABLET | Freq: Every day | ORAL | Status: DC
  Administered 2011-07-29: 40 mg via ORAL
  Filled 2011-07-28 (×2): qty 1

## 2011-07-28 MED ORDER — ACETAMINOPHEN 325 MG PO TABS
650.0000 mg | ORAL_TABLET | Freq: Four times a day (QID) | ORAL | Status: DC
  Administered 2011-07-28 – 2011-07-31 (×12): 650 mg via ORAL
  Filled 2011-07-28 (×9): qty 2
  Filled 2011-07-28: qty 4
  Filled 2011-07-28: qty 2

## 2011-07-28 MED ORDER — ONDANSETRON HCL 4 MG/2ML IJ SOLN: 4.0000 mg | INTRAMUSCULAR | Status: DC | PRN

## 2011-07-28 MED ORDER — ACETAMINOPHEN 650 MG RE SUPP: 650.0000 mg | Freq: Four times a day (QID) | RECTAL | Status: DC

## 2011-07-28 MED ORDER — METOCLOPRAMIDE HCL 5 MG/ML IJ SOLN
10.0000 mg | Freq: Once | INTRAMUSCULAR | Status: DC
  Filled 2011-07-28: qty 2

## 2011-07-28 MED ORDER — METOCLOPRAMIDE HCL 10 MG PO TABS: 10.0000 mg | ORAL_TABLET | Freq: Three times a day (TID) | ORAL | Status: DC

## 2011-07-28 MED ORDER — GUAIFENESIN ER 600 MG PO TB12
1200.0000 mg | ORAL_TABLET | Freq: Two times a day (BID) | ORAL | Status: DC
  Administered 2011-07-28 – 2011-07-31 (×7): 1200 mg via ORAL
  Filled 2011-07-28 (×2): qty 2
  Filled 2011-07-28 (×2): qty 1
  Filled 2011-07-28 (×3): qty 2

## 2011-07-28 MED ORDER — BUPROPION HCL ER (XL) 300 MG PO TB24
300.0000 mg | ORAL_TABLET | ORAL | Status: DC
  Administered 2011-07-28 – 2011-07-31 (×4): 300 mg via ORAL
  Filled 2011-07-28 (×6): qty 1

## 2011-07-28 MED ORDER — HYDROMORPHONE HCL PF 1 MG/ML IJ SOLN: 1.0000 mg | Freq: Once | INTRAMUSCULAR | Status: DC

## 2011-07-28 MED ORDER — METHYLPREDNISOLONE SODIUM SUCC 125 MG IJ SOLR: 125.0000 mg | Freq: Four times a day (QID) | INTRAMUSCULAR | Status: DC

## 2011-07-28 MED ORDER — AMLODIPINE BESYLATE 5 MG PO TABS
5.0000 mg | ORAL_TABLET | Freq: Every day | ORAL | Status: DC
  Administered 2011-07-28 – 2011-07-31 (×4): 5 mg via ORAL
  Filled 2011-07-28 (×4): qty 1

## 2011-07-28 MED ORDER — ENOXAPARIN SODIUM 40 MG/0.4ML ~~LOC~~ SOLN
40.0000 mg | Freq: Every day | SUBCUTANEOUS | Status: DC
  Administered 2011-07-28 – 2011-07-29 (×2): 40 mg via SUBCUTANEOUS
  Filled 2011-07-28 (×2): qty 0.4

## 2011-07-28 MED ORDER — DEXTROSE 5 % IV SOLN
500.0000 mg | INTRAVENOUS | Status: DC
  Administered 2011-07-29 – 2011-07-31 (×3): 500 mg via INTRAVENOUS
  Filled 2011-07-28 (×5): qty 500

## 2011-07-28 MED ORDER — ACETAMINOPHEN 650 MG RE SUPP: 650.0000 mg | Freq: Four times a day (QID) | RECTAL | Status: DC | PRN

## 2011-07-28 MED ORDER — LABETALOL HCL 200 MG PO TABS
200.0000 mg | ORAL_TABLET | Freq: Two times a day (BID) | ORAL | Status: DC
  Administered 2011-07-28 – 2011-07-31 (×7): 200 mg via ORAL
  Filled 2011-07-28 (×7): qty 1

## 2011-07-28 MED ORDER — ONDANSETRON HCL 4 MG PO TABS: 4.0000 mg | ORAL_TABLET | ORAL | Status: DC | PRN

## 2011-07-28 MED ORDER — IPRATROPIUM BROMIDE 0.02 % IN SOLN
0.5000 mg | RESPIRATORY_TRACT | Status: DC
  Administered 2011-07-28 – 2011-07-31 (×17): 0.5 mg via RESPIRATORY_TRACT
  Filled 2011-07-28 (×16): qty 2.5

## 2011-07-28 MED ORDER — IBUPROFEN 800 MG PO TABS
800.0000 mg | ORAL_TABLET | Freq: Three times a day (TID) | ORAL | Status: DC
  Administered 2011-07-28: 800 mg via ORAL
  Filled 2011-07-28: qty 1

## 2011-07-28 MED ORDER — HYDROMORPHONE HCL PF 1 MG/ML IJ SOLN
1.0000 mg | INTRAMUSCULAR | Status: DC | PRN
  Administered 2011-07-28 – 2011-07-31 (×20): 1 mg via INTRAVENOUS
  Filled 2011-07-28 (×20): qty 1

## 2011-07-28 MED ORDER — IPRATROPIUM BROMIDE 0.02 % IN SOLN
0.5000 mg | Freq: Once | RESPIRATORY_TRACT | Status: AC
  Administered 2011-07-28: 0.5 mg via RESPIRATORY_TRACT
  Filled 2011-07-28: qty 2.5

## 2011-07-28 NOTE — ED Notes (Signed)
Attempted to call report to Dept. 300. Nurse unable to take report at this time.

## 2011-07-28 NOTE — ED Provider Notes (Signed)
History     CSN: [id number]  Arrival date & time 07/27/11  2137   First MD Initiated Contact with Patient 07/27/11 2308      Chief Complaint  Patient presents with  . Shortness of Breath    (Consider location/radiation/quality/duration/timing/severity/associated sxs/prior treatment) Patient is a 46 y.o. female presenting with shortness of breath.  Shortness of Breath  The current episode started yesterday. The problem has been unchanged. The problem is severe. The symptoms are relieved by nothing. Associated symptoms include chest pain, cough, shortness of breath and wheezing. Pertinent negatives include no chest pressure, no fever, no sore throat and no stridor.  patient with history of COPD. Presents with exacerbation of COPD onset of symptoms were yesterday patient's been using albuterol inhaler frequently without relief from the wheezing. Also with cough occasionally productive left anterior chest pain related with cough only. Patient's shortness of breath is moderate to severe room air saturation on arrival was 92%. Denies fever sore throat or flulike symptoms.  Past Medical History  Diagnosis Date  . Asthma   . COPD (chronic obstructive pulmonary disease)   . Hypertension   . GERD (gastroesophageal reflux disease)   . Anxiety disorder   . Chronic pain syndrome     Past Surgical History  Procedure Date  . Cholecystectomy   . Cesarean section   . Carpal tunnel release     Family History  Problem Relation Age of Onset  . Asthma Mother   . Asthma Father   . Cancer Father     History  Substance Use Topics  . Smoking status: Former Smoker    Quit date: 10/25/2010  . Smokeless tobacco: Not on file  . Alcohol Use: Yes    OB History    Grav Para Term Preterm Abortions TAB SAB Ect Mult Living                  Review of Systems  Constitutional: Positive for fatigue. Negative for fever.  HENT: Negative for congestion, sore throat and neck pain.   Respiratory:  Positive for cough, shortness of breath and wheezing. Negative for stridor.   Cardiovascular: Positive for chest pain.  Gastrointestinal: Positive for abdominal pain. Negative for nausea and diarrhea.  Genitourinary: Negative for dysuria.  Musculoskeletal: Negative for back pain.  Skin: Negative for rash.  Neurological: Positive for headaches.  Hematological: Does not bruise/bleed easily.    Allergies  Review of patient's allergies indicates no known allergies.  Home Medications   Current Outpatient Rx  Name Route Sig Dispense Refill  . ALBUTEROL SULFATE HFA 108 (90 BASE) MCG/ACT IN AERS Inhalation Inhale 2 puffs into the lungs every 6 (six) hours as needed. For shortness of breath    . ALBUTEROL SULFATE (5 MG/ML) 0.5% IN NEBU Nebulization Take 2.5 mg by nebulization every 6 (six) hours as needed. For shortness of breath    . ALBUTEROL SULFATE PO Oral Take 1 tablet by mouth 4 (four) times daily.      Marland Kitchen AMLODIPINE BESYLATE 5 MG PO TABS Oral Take 5 mg by mouth daily.      . BUPROPION HCL ER (XL) 300 MG PO TB24 Oral Take 300 mg by mouth every morning.      . CYCLOBENZAPRINE HCL 10 MG PO TABS Oral Take 10 mg by mouth as needed. For pain/sleep     . DIAZEPAM 10 MG PO TABS Oral Take 10 mg by mouth daily as needed. For spasms/anxiety     . OMEGA-3 FATTY  ACIDS 1000 MG PO CAPS Oral Take 2 g by mouth every morning.      Marland Kitchen HYDROCODONE-ACETAMINOPHEN 10-325 MG PO TABS Oral Take 1 tablet by mouth every 6 (six) hours as needed. For pain     . LABETALOL HCL 200 MG PO TABS Oral Take 200 mg by mouth 2 (two) times daily.      Marland Kitchen MIRTAZAPINE 15 MG PO TABS Oral Take 15 mg by mouth at bedtime as needed. For sleep      BP 156/98  Pulse 69  Temp(Src) 97.7 F (36.5 C) (Oral)  Resp 14  Ht 5\' 4"  (1.626 m)  Wt 160 lb (72.576 kg)  BMI 27.46 kg/m2  SpO2 96%  Physical Exam  Nursing note and vitals reviewed. Constitutional: She is oriented to person, place, and time. She appears well-developed and  well-nourished.  HENT:  Head: Normocephalic and atraumatic.  Mouth/Throat: Oropharynx is clear and moist.  Eyes: Conjunctivae and EOM are normal. Pupils are equal, round, and reactive to light.  Neck: Normal range of motion. Neck supple.  Cardiovascular: Normal rate, regular rhythm, normal heart sounds and intact distal pulses.   No murmur heard. Pulmonary/Chest: Effort normal. No respiratory distress. She has wheezes. She has no rales. She exhibits no tenderness.  Abdominal: Soft. Bowel sounds are normal. There is no tenderness.  Musculoskeletal: Normal range of motion. She exhibits no edema and no tenderness.  Lymphadenopathy:    She has no cervical adenopathy.  Neurological: She is alert and oriented to person, place, and time. No cranial nerve deficit. She exhibits normal muscle tone. Coordination normal.  Skin: Skin is warm and dry. No rash noted.    ED Course  Procedures (including critical care time)  Labs Reviewed  CBC - Abnormal; Notable for the following:    WBC 11.5 (*)    All other components within normal limits  BASIC METABOLIC PANEL - Abnormal; Notable for the following:    Glucose, Bld 108 (*)    All other components within normal limits   Dg Chest 2 View  07/28/2011  *RADIOLOGY REPORT*  Clinical Data: Asthma, shortness of breath, congestion, chest.  CHEST - 2 VIEW  Comparison: 11/04/2010  Findings: Normal heart size, mediastinal contours, and pulmonary vascularity. Hyperinflation and mild peribronchial thickening. No pulmonary infiltrate, pleural effusion or pneumothorax. Bones appear demineralized.  IMPRESSION: Bronchitic changes and hyperinflation. No acute abnormalities.  Original Report Authenticated By: Lollie Marrow, M.D.   Results for orders placed during the hospital encounter of 07/27/11  CBC      Component Value Range   WBC 11.5 (*) 4.0 - 10.5 (K/uL)   RBC 4.03  3.87 - 5.11 (MIL/uL)   Hemoglobin 12.8  12.0 - 15.0 (g/dL)   HCT 40.9  81.1 - 91.4 (%)    MCV 95.8  78.0 - 100.0 (fL)   MCH 31.8  26.0 - 34.0 (pg)   MCHC 33.2  30.0 - 36.0 (g/dL)   RDW 78.2  95.6 - 21.3 (%)   Platelets 302  150 - 400 (K/uL)  BASIC METABOLIC PANEL      Component Value Range   Sodium 136  135 - 145 (mEq/L)   Potassium 3.6  3.5 - 5.1 (mEq/L)   Chloride 105  96 - 112 (mEq/L)   CO2 24  19 - 32 (mEq/L)   Glucose, Bld 108 (*) 70 - 99 (mg/dL)   BUN 12  6 - 23 (mg/dL)   Creatinine, Ser 0.86  0.50 - 1.10 (mg/dL)  Calcium 9.6  8.4 - 10.5 (mg/dL)   GFR calc non Af Amer >90  >90 (mL/min)   GFR calc Af Amer >90  >90 (mL/min)     1. COPD (chronic obstructive pulmonary disease)   2. Hypertension   3. GERD (gastroesophageal reflux disease)   4. Anxiety disorder   5. Chronic pain syndrome       MDM   History of COPD came in for exacerbation is significant wheezing more pronounced on the left. Treated in the emergency department with albuterol Atrovent x2 inside Medrol chest x-ray negative for pneumonia.   Wheezing improve some but not totally clear patient's room air sats in the low 90s will be admitted by hospitalist for further COPD exacerbation.        Shelda Jakes, MD 07/28/11 [phone]

## 2011-07-28 NOTE — H&P (Signed)
PCP:   Cassell Smiles., MD, MD   Chief Complaint:  Persistent wheezing for over 24-hours  HPI: [name] is an 46 y.o. Caucasian female.   Asthma since childhood; 30 years of tobacco abuse, discontinued 8 months ago; maintained on albuterol inhalers, nebulizer, and tablets at home. Uses her inhaler frequently during the day, every day, sustained attacks of asthma about every 2 weeks.  Since Friday night the patient had persistent wheezing cough productive of white sputum, unrelieved by frequent use of her home albuterol nebulizer. Initially she is become so weak and short of breath she presented to the emergency room she has had serial nebulizations with albuterol and ipratropium and although much improved as continued to wheeze.  She denies fever; she had a flu vaccine 2 months ago; he can't remember if she's ever had  pneumonia vaccine.  She complains of severe chest pain because of the persistent coughing.  Also has a history GERD, status post esophageal dilation esophageal webs.  Rewiew of Systems:  The patient denies anorexia, fever, weight loss,, vision loss, decreased hearing, hoarseness,  syncope, dyspnea on exertion, peripheral edema, balance deficits, hemoptysis, abdominal pain, melena, hematochezia, severe indigestion/heartburn, hematuria, incontinence, genital sores, muscle weakness, suspicious skin lesions, transient blindness, difficulty walking, depression, unusual weight change, abnormal bleeding, enlarged lymph nodes, angioedema, and breast masses.    Past Medical History  Diagnosis Date  . Asthma   . COPD (chronic obstructive pulmonary disease)   . Hypertension   . GERD (gastroesophageal reflux disease)   . Anxiety disorder   . Chronic pain syndrome     Past Surgical History  Procedure Date  . Cholecystectomy   . Cesarean section   . Carpal tunnel release     Medications:  HOME MEDS: Prior to Admission medications   Medication Sig Start Date End  Date Taking? Authorizing Provider  albuterol (PROVENTIL HFA;VENTOLIN HFA) 108 (90 BASE) MCG/ACT inhaler Inhale 2 puffs into the lungs every 6 (six) hours as needed. For shortness of breath    Historical Provider, MD  albuterol (PROVENTIL) (5 MG/ML) 0.5% nebulizer solution Take 2.5 mg by nebulization every 6 (six) hours as needed. For shortness of breath    Historical Provider, MD  ALBUTEROL SULFATE PO Take 1 tablet by mouth 4 (four) times daily.      Historical Provider, MD  amLODipine (NORVASC) 5 MG tablet Take 5 mg by mouth daily.      Historical Provider, MD  buPROPion (WELLBUTRIN XL) 300 MG 24 hr tablet Take 300 mg by mouth every morning.      Historical Provider, MD  cyclobenzaprine (FLEXERIL) 10 MG tablet Take 10 mg by mouth as needed. For pain/sleep     Historical Provider, MD  diazepam (VALIUM) 10 MG tablet Take 10 mg by mouth daily as needed. For spasms/anxiety     Historical Provider, MD  fish oil-omega-3 fatty acids 1000 MG capsule Take 2 g by mouth every morning.      Historical Provider, MD  HYDROcodone-acetaminophen (NORCO) 10-325 MG per tablet Take 1 tablet by mouth every 6 (six) hours as needed. For pain     Historical Provider, MD  labetalol (NORMODYNE) 200 MG tablet Take 200 mg by mouth 2 (two) times daily.      Historical Provider, MD  mirtazapine (REMERON) 15 MG tablet Take 15 mg by mouth at bedtime as needed. For sleep    Historical Provider, MD     Allergies:  No Known Allergies  Social History:  reports that she quit smoking about 9 months ago. She does not have any smokeless tobacco history on file. She reports that she drinks alcohol. She reports that she does not use illicit drugs.  Family History: Family History  Problem Relation Age of Onset  . Asthma Mother   . Asthma Father   . Cancer Father      Physical Exam: Filed Vitals:   07/28/11 0055 07/28/11 0151 07/28/11 0158 07/28/11 0330  BP: 129/72 128/81  156/98  Pulse:  69    Temp: 97.6 F (36.4 C)    97.7 F (36.5 C)  TempSrc: Oral   Oral  Resp: 12 13  14   Height:      Weight:      SpO2: 95% 94% 93% 96%   Blood pressure 156/98, pulse 69, temperature 97.7 F (36.5 C), temperature source Oral, resp. rate 14, height 5\' 4"  (1.626 m), weight 72.576 kg (160 lb), SpO2 96.00%.  GEN:  Pleasant but anxious middle-aged Caucasian lady lying in the stretcher breathing rapidly and coughing frequently; cooperative with exam PSYCH:  alert and oriented x4; does appear anxious. HEENT: Mucous membranes pink and anicteric; PERRLA; EOM intact; no cervical lymphadenopathy nor thyromegaly or carotid bruit; no JVD; Breasts:: Not examined CHEST WALL: No tenderness CHEST: tachypneic, diffuse wheezing and rhonchi HEART: Regular rate and rhythm; no murmurs rubs or gallops BACK: No kyphosis or scoliosis; no CVA tenderness ABDOMEN: Obese, soft non-tender; no masses, no organomegaly, normal abdominal bowel sounds; Rectal Exam: Not done EXTREMITIES: No bone or joint deformity;  no edema; no ulcerations. Genitalia: not examined PULSES: 2+ and symmetric SKIN: Normal hydration no rash or ulceration CNS: Cranial nerves 2-12 grossly intact no focal neurologic deficit   Labs & Imaging Results for orders placed during the hospital encounter of 07/27/11 (from the past 48 hour(s))  CBC     Status: Abnormal   Collection Time   07/27/11 11:41 PM      Component Value Range Comment   WBC 11.5 (*) 4.0 - 10.5 (K/uL)    RBC 4.03  3.87 - 5.11 (MIL/uL)    Hemoglobin 12.8  12.0 - 15.0 (g/dL)    HCT 16.1  09.6 - 04.5 (%)    MCV 95.8  78.0 - 100.0 (fL)    MCH 31.8  26.0 - 34.0 (pg)    MCHC 33.2  30.0 - 36.0 (g/dL)    RDW 40.9  81.1 - 91.4 (%)    Platelets 302  150 - 400 (K/uL)   BASIC METABOLIC PANEL     Status: Abnormal   Collection Time   07/27/11 11:41 PM      Component Value Range Comment   Sodium 136  135 - 145 (mEq/L)    Potassium 3.6  3.5 - 5.1 (mEq/L)    Chloride 105  96 - 112 (mEq/L)    CO2 24  19 - 32  (mEq/L)    Glucose, Bld 108 (*) 70 - 99 (mg/dL)    BUN 12  6 - 23 (mg/dL)    Creatinine, Ser 7.82  0.50 - 1.10 (mg/dL)    Calcium 9.6  8.4 - 10.5 (mg/dL)    GFR calc non Af Amer >90  >90 (mL/min)    GFR calc Af Amer >90  >90 (mL/min)    Dg Chest 2 View  07/28/2011  *RADIOLOGY REPORT*  Clinical Data: Asthma, shortness of breath, congestion, chest.  CHEST - 2 VIEW  Comparison: 11/04/2010  Findings: Normal heart size, mediastinal contours, and  pulmonary vascularity. Hyperinflation and mild peribronchial thickening. No pulmonary infiltrate, pleural effusion or pneumothorax. Bones appear demineralized.  IMPRESSION: Bronchitic changes and hyperinflation. No acute abnormalities.  Original Report Authenticated By: Lollie Marrow, M.D.      Assessment Present on Admission:  .Asthma status/.Chronic obstructive asthma with exacerbation H/o COPD (chronic obstructive pulmonary disease) .Anxiety disorder  .Chronic pain syndrome  .Marland KitchenGERD (gastroesophageal reflux disease) .Hypertension   PLAN:  Although this lady has persistent wheezing is quite tachypnea is maintaining her oxygen saturation adequately her mentation is good. We'll admit her to a medical bed for serial nebulizations, steroid,s and hydration.  Will add ipratropium to her regimen, and add a steroid inhaler  Other plans as per orders.   Renad Jenniges 07/28/2011, 4:41 AM

## 2011-07-28 NOTE — Progress Notes (Signed)
[facility] Inpatient Progress Note  07/28/2011  Subjective: Pt reports that she is still coughing quite a bit and having  pain in the ribs with each cough and deep breath but overall reports that she is improving.  Her cough has become productive.     Objective:  Vital signs in last 24 hours: Filed Vitals:   07/28/11 0330 07/28/11 0455 07/28/11 0620 07/28/11 0747  BP: 156/98 166/74 136/87   Pulse:   68   Temp: 97.7 F (36.5 C)  97.8 F (36.6 C)   TempSrc: Oral  Oral   Resp: 14 22 16    Height:   5' 4.5" (1.638 m)   Weight:   68.1 kg (150 lb 2.1 oz)   SpO2: 96%  94% 93%   Weight change:  No intake or output data in the 24 hours ending 07/28/11 1022  Review of Systems Pertinent items are noted in HPI.  Otherwise all reviewed and reported as negative.   Physical Exam General appearance: alert, cooperative and no distress Head: Normocephalic, without obvious abnormality, atraumatic Eyes: negative Nose: Nares normal. Septum midline. Mucosa normal. No drainage or sinus tenderness., no discharge Throat: lips, mucosa, and tongue normal; teeth and gums normal Lungs: bilateral insp and expiratory wheezing Heart: S1, S2 normal Abdomen: soft, non-tender; bowel sounds normal; no masses,  no organomegaly Extremities: extremities normal, atraumatic, no cyanosis or edema Lymph nodes: Cervical, supraclavicular, and axillary nodes normal.  Lab Results: @labtest2 @  Micro Results: No results found for this or any previous visit (from the past 240 hour(s)).  Studies/Results: Dg Chest 2 View  07/28/2011  *RADIOLOGY REPORT*  Clinical Data: Asthma, shortness of breath, congestion, chest.  CHEST - 2 VIEW  Comparison: 11/04/2010  Findings: Normal heart size, mediastinal contours, and pulmonary vascularity. Hyperinflation and mild peribronchial thickening. No pulmonary infiltrate, pleural effusion or pneumothorax. Bones appear demineralized.  IMPRESSION: Bronchitic changes and  hyperinflation. No acute abnormalities.  Original Report Authenticated By: Lollie Marrow, M.D.    Medications:  Scheduled Meds:   . albuterol  2.5 mg Nebulization Q4H WA  . albuterol  5 mg Nebulization Once  . albuterol  5 mg Nebulization Once  . amLODipine  5 mg Oral Daily  . azithromycin  500 mg Intravenous Q24H  . buPROPion  300 mg Oral Q0700  . enoxaparin  40 mg Subcutaneous Daily  . fluticasone  2 puff Inhalation BID  . guaiFENesin  1,200 mg Oral BID  . HYDROmorphone      . HYDROmorphone  1 mg Intravenous Once  . ipratropium  0.5 mg Nebulization Once  . ipratropium  0.5 mg Nebulization Once  . ipratropium  0.5 mg Nebulization Q4H WA  . labetalol  200 mg Oral BID  . methylPREDNISolone sodium succinate  125 mg Intravenous Once  . methylPREDNISolone (SOLU-MEDROL) injection  125 mg Intravenous Q6H  . metoCLOPramide (REGLAN) injection  10 mg Intravenous Once   Followed by  . metoCLOPramide  10 mg Oral QHS  . omega-3 acid ethyl esters  2 capsule Oral Q0700  . ondansetron  4 mg Intravenous Once  . pantoprazole (PROTONIX) IV  40 mg Intravenous Once   Followed by  . pantoprazole  40 mg Oral Q1200  . DISCONTD: HYDROmorphone  1 mg Intravenous Once   Continuous Infusions:   . 0.9 % NaCl with KCl 20 mEq / L 1,000 mL (07/28/11 0644)  . DISCONTD: sodium chloride 100 mL/hr at 07/28/11 0142   PRN Meds:.acetaminophen, acetaminophen, albuterol, benzonatate, cyclobenzaprine, HYDROmorphone, ondansetron (  ZOFRAN) IV, ondansetron, oxyCODONE, traZODone  Assessment/Plan:  Chronic obstructive asthma with exacerbation (07/28/2011)  - symptoms starting to improve but still with significant wheezing, tachypnea  - continue aggressive inpatient treatments   - made small adjustments to IV steroid regimen (please see orders), following lytes  Pleuritic type chest pain - adding NSAIDS for pain control    Hypertension - resume home blood pressure medications   GERD (gastroesophageal reflux  disease) - currently stable    Anxiety disorder  - resume home valium as needed   Chronic pain syndrome - Pt complaining that pain not well controlled, will adjust prn meds to aim for better pain control, also adding ibuprofen for treatment of pleuritic type chest wall pain.  See orders.   Asthma (07/28/2011)   - continue treatments as above.    LOS: 1 day   Rosamond Andress 07/28/2011, 10:22 AM   Cleora Fleet, MD, CDE, FAAFP [facility] [place], [place]  [phone]

## 2011-07-29 LAB — CBC
Platelets: 333 10*3/uL (ref 150–400)
RDW: 12.8 % (ref 11.5–15.5)
WBC: 13.4 10*3/uL — ABNORMAL HIGH (ref 4.0–10.5)

## 2011-07-29 LAB — BASIC METABOLIC PANEL
Calcium: 9.8 mg/dL (ref 8.4–10.5)
Creatinine, Ser: 0.55 mg/dL (ref 0.50–1.10)
GFR calc Af Amer: >90 mL/min (ref >90)
GFR calc non Af Amer: >90 mL/min (ref >90)

## 2011-07-29 MED ORDER — METHYLPREDNISOLONE SODIUM SUCC 125 MG IJ SOLR
80.0000 mg | Freq: Two times a day (BID) | INTRAMUSCULAR | Status: DC
  Administered 2011-07-29 – 2011-07-31 (×4): 80 mg via INTRAVENOUS
  Filled 2011-07-29 (×4): qty 2

## 2011-07-29 NOTE — Progress Notes (Signed)
Subjective: Reports breathing is starting to get better, but she is having difficulty swallowing. She says she is unable to swallow any solid foods.  This has been occuring for a few weeks now and she is very concerned about this.  Also noticing increasing reflux.  Reports that she has had to have her esophagus dilated many times in the past.  Thinks she has seen Dr. Jena Gauss.  Objective: Vital signs in last 24 hours: Temp:  [97.3 F (36.3 C)-98 F (36.7 C)] 97.3 F (36.3 C) (01/14 1105) Pulse Rate:  [74-78] 76  (01/14 1105) Resp:  [18-20] 20  (01/14 1105) BP: (135-160)/(67-94) 154/92 mmHg (01/14 1105) SpO2:  [90 %-96 %] 96 % (01/14 1147) Weight:  [70.2 kg (154 lb 12.2 oz)] 70.2 kg (154 lb 12.2 oz) (01/14 0503) Weight change: -2.375 kg (-5 lb 3.8 oz) Last BM Date: 07/27/11  Intake/Output from previous day: 01/13 0701 - 01/14 0700 In: 2290 [P.O.:1520; I.V.:770] Out: 1375 [Urine:1375] Total I/O In: 240 [P.O.:240] Out: -    Physical Exam: General: Alert, awake, oriented x3, in no acute distress. HEENT: No bruits, no goiter. Heart: Regular rate and rhythm, without murmurs, rubs, gallops. Lungs: b/l exp wheezes. Abdomen: Soft, nontender, nondistended, positive bowel sounds. Extremities: No clubbing cyanosis or edema with positive pedal pulses. Neuro: Grossly intact, nonfocal.    Lab Results: Basic Metabolic Panel:  Basename 07/29/11 0515 07/27/11 2341 07/27/11 2330  NA 136 136 --  K 3.9 3.6 --  CL 103 105 --  CO2 24 24 --  GLUCOSE 136* 108* --  BUN 10 12 --  CREATININE 0.55 0.57 --  CALCIUM 9.8 9.6 --  MG -- -- 1.9  PHOS -- -- --   Liver Function Tests:  [facility] 07/27/11 2330  AST 14  ALT 14  ALKPHOS 93  BILITOT 0.3  PROT 7.5  ALBUMIN 3.6   No results found for this basename: LIPASE:2,AMYLASE:2 in the last 72 hours No results found for this basename: AMMONIA:2 in the last 72 hours CBC:  Basename 07/29/11 0515 07/27/11 2341  WBC 13.4* 11.5*  NEUTROABS -- --   HGB 13.2 12.8  HCT 40.0 38.6  MCV 96.2 95.8  PLT 333 302   Cardiac Enzymes: No results found for this basename: CKTOTAL:3,CKMB:3,CKMBINDEX:3,TROPONINI:3 in the last 72 hours BNP: No results found for this basename: PROBNP:3 in the last 72 hours D-Dimer: No results found for this basename: DDIMER:2 in the last 72 hours CBG:  Basename 07/29/11 0634 07/28/11 2106  GLUCAP 183* 158*   Hemoglobin A1C:  Basename 07/27/11 2330  HGBA1C 5.4   Fasting Lipid Panel: No results found for this basename: CHOL,HDL,LDLCALC,TRIG,CHOLHDL,LDLDIRECT in the last 72 hours Thyroid Function Tests: No results found for this basename: TSH,T4TOTAL,FREET4,T3FREE,THYROIDAB in the last 72 hours Anemia Panel: No results found for this basename: VITAMINB12,FOLATE,FERRITIN,TIBC,IRON,RETICCTPCT in the last 72 hours Coagulation: No results found for this basename: LABPROT:2,INR:2 in the last 72 hours Urine Drug Screen: Drugs of Abuse     Component Value Date/Time   LABOPIA NONE DETECTED 10/25/2008 0344   COCAINSCRNUR POSITIVE* 10/25/2008 0344   LABBENZ NONE DETECTED 10/25/2008 0344   AMPHETMU NONE DETECTED 10/25/2008 0344   THCU POSITIVE* 10/25/2008 0344   LABBARB  Value: NONE DETECTED        DRUG SCREEN FOR MEDICAL PURPOSES ONLY.  IF CONFIRMATION IS NEEDED FOR ANY PURPOSE, NOTIFY LAB WITHIN 5 DAYS.        LOWEST DETECTABLE LIMITS FOR URINE DRUG SCREEN Drug Class  Cutoff (ng/mL) Amphetamine      1000 Barbiturate      200 Benzodiazepine   200 Tricyclics       300 Opiates          300 Cocaine          300 THC              50 10/25/2008 0344    Alcohol Level: No results found for this basename: ETH:2 in the last 72 hours  No results found for this or any previous visit (from the past 240 hour(s)).  Studies/Results: Dg Chest 2 View  07/28/2011  *RADIOLOGY REPORT*  Clinical Data: Asthma, shortness of breath, congestion, chest.  CHEST - 2 VIEW  Comparison: 11/04/2010  Findings: Normal heart size, mediastinal  contours, and pulmonary vascularity. Hyperinflation and mild peribronchial thickening. No pulmonary infiltrate, pleural effusion or pneumothorax. Bones appear demineralized.  IMPRESSION: Bronchitic changes and hyperinflation. No acute abnormalities.  Original Report Authenticated By: Lollie Marrow, M.D.    Medications: Scheduled Meds:   . acetaminophen  650 mg Oral Q6H   Or  . acetaminophen  650 mg Rectal Q6H  . albuterol  2.5 mg Nebulization Q4H WA  . amLODipine  5 mg Oral Daily  . azithromycin  500 mg Intravenous Q24H  . buPROPion  300 mg Oral Q0700  . enoxaparin  40 mg Subcutaneous Daily  . fluticasone  2 puff Inhalation BID  . guaiFENesin  1,200 mg Oral BID  . ipratropium  0.5 mg Nebulization Q4H WA  . labetalol  200 mg Oral BID  . methylPREDNISolone (SOLU-MEDROL) injection  80 mg Intravenous Q6H  . metoCLOPramide  10 mg Oral TID AC & HS  . omega-3 acid ethyl esters  2 capsule Oral Q0700  . pantoprazole (PROTONIX) IV  40 mg Intravenous Once   Followed by  . pantoprazole  40 mg Oral Q1200  . DISCONTD: metoCLOPramide (REGLAN) injection  10 mg Intravenous Once  . DISCONTD: metoCLOPramide  10 mg Oral QHS  . DISCONTD: metoCLOPramide  10 mg Oral TID AC & HS   Continuous Infusions:  PRN Meds:.albuterol, alum & mag hydroxide-simeth, benzonatate, cyclobenzaprine, HYDROmorphone, ondansetron (ZOFRAN) IV, ondansetron, oxyCODONE, traZODone  Assessment/Plan:  1. Exacerbation of COPD/Asthma.  Appears to be improving.  Continue steroids, but decrease to q12h.  Continue nebs and mucolytics.  2. Dysphagia.  Continue PPI for any element of GERD.  Will ask GI to see as she may need another dilatation  3. HTN, continue home regimen  4. Anxiety/chronic pain, continue current meds  5. Dispo, can likely discharge home, once dysphagia has been addressed, especially since she reports difficulty swallowing pills.   LOS: 2 days   Amerie Beaumont [facility] 07/29/2011, 1:00 PM

## 2011-07-29 NOTE — Progress Notes (Signed)
UR Chart Review Completed  

## 2011-07-30 DIAGNOSIS — R1310 Dysphagia, unspecified: Secondary | ICD-10-CM

## 2011-07-30 MED ORDER — PANTOPRAZOLE SODIUM 40 MG PO TBEC
40.0000 mg | DELAYED_RELEASE_TABLET | Freq: Every day | ORAL | Status: DC
  Administered 2011-07-30: 40 mg via ORAL

## 2011-07-30 MED ORDER — IPRATROPIUM BROMIDE 0.02 % IN SOLN
RESPIRATORY_TRACT | Status: AC
  Administered 2011-07-30: 0.5 mg via RESPIRATORY_TRACT
  Filled 2011-07-30: qty 2.5

## 2011-07-30 MED ORDER — PANTOPRAZOLE SODIUM 40 MG PO TBEC
40.0000 mg | DELAYED_RELEASE_TABLET | Freq: Two times a day (BID) | ORAL | Status: DC
  Administered 2011-07-30 – 2011-07-31 (×2): 40 mg via ORAL
  Filled 2011-07-30 (×2): qty 1

## 2011-07-30 MED ORDER — ALBUTEROL SULFATE (5 MG/ML) 0.5% IN NEBU
INHALATION_SOLUTION | RESPIRATORY_TRACT | Status: AC
  Administered 2011-07-30: 2.5 mg via RESPIRATORY_TRACT
  Filled 2011-07-30: qty 0.5

## 2011-07-30 NOTE — Progress Notes (Signed)
Subjective: Having difficulty with acid reflux last night, cough more productive today, feels breathing is getting slowly better  Objective: Vital signs in last 24 hours: Temp:  [97.6 F (36.4 C)-98.2 F (36.8 C)] 97.8 F (36.6 C) (01/15 0812) Pulse Rate:  [74-79] 74  (01/15 0812) Resp:  [18-20] 18  (01/15 0812) BP: (130-157)/(87-96) 157/96 mmHg (01/15 0812) SpO2:  [94 %-98 %] 95 % (01/15 0812) Weight:  [70.3 kg (154 lb 15.7 oz)] 70.3 kg (154 lb 15.7 oz) (01/15 0546) Weight change: 0.1 kg (3.5 oz) Last BM Date: 07/27/11  Intake/Output from previous day: 01/14 0701 - 01/15 0700 In: 480 [P.O.:480] Out: 2800 [Urine:2800] Total I/O In: 240 [P.O.:240] Out: -    Physical Exam: General: Alert, awake, oriented x3, in no acute distress. HEENT: No bruits, no goiter. Heart: Regular rate and rhythm, without murmurs, rubs, gallops. Lungs: b/l exp wheezes Abdomen: Soft, nontender, nondistended, positive bowel sounds. Extremities: No clubbing cyanosis or edema with positive pedal pulses. Neuro: Grossly intact, nonfocal.    Lab Results: Basic Metabolic Panel:  Basename 07/29/11 0515 07/27/11 2341 07/27/11 2330  NA 136 136 --  K 3.9 3.6 --  CL 103 105 --  CO2 24 24 --  GLUCOSE 136* 108* --  BUN 10 12 --  CREATININE 0.55 0.57 --  CALCIUM 9.8 9.6 --  MG -- -- 1.9  PHOS -- -- --   Liver Function Tests:  [facility] 07/27/11 2330  AST 14  ALT 14  ALKPHOS 93  BILITOT 0.3  PROT 7.5  ALBUMIN 3.6   No results found for this basename: LIPASE:2,AMYLASE:2 in the last 72 hours No results found for this basename: AMMONIA:2 in the last 72 hours CBC:  Basename 07/29/11 0515 07/27/11 2341  WBC 13.4* 11.5*  NEUTROABS -- --  HGB 13.2 12.8  HCT 40.0 38.6  MCV 96.2 95.8  PLT 333 302   Cardiac Enzymes: No results found for this basename: CKTOTAL:3,CKMB:3,CKMBINDEX:3,TROPONINI:3 in the last 72 hours BNP: No results found for this basename: PROBNP:3 in the last 72  hours D-Dimer: No results found for this basename: DDIMER:2 in the last 72 hours CBG:  Basename 07/29/11 0634 07/28/11 2106  GLUCAP 183* 158*   Hemoglobin A1C:  Basename 07/27/11 2330  HGBA1C 5.4   Fasting Lipid Panel: No results found for this basename: CHOL,HDL,LDLCALC,TRIG,CHOLHDL,LDLDIRECT in the last 72 hours Thyroid Function Tests: No results found for this basename: TSH,T4TOTAL,FREET4,T3FREE,THYROIDAB in the last 72 hours Anemia Panel: No results found for this basename: VITAMINB12,FOLATE,FERRITIN,TIBC,IRON,RETICCTPCT in the last 72 hours Coagulation: No results found for this basename: LABPROT:2,INR:2 in the last 72 hours Urine Drug Screen: Drugs of Abuse     Component Value Date/Time   LABOPIA NONE DETECTED 10/25/2008 0344   COCAINSCRNUR POSITIVE* 10/25/2008 0344   LABBENZ NONE DETECTED 10/25/2008 0344   AMPHETMU NONE DETECTED 10/25/2008 0344   THCU POSITIVE* 10/25/2008 0344   LABBARB  Value: NONE DETECTED        DRUG SCREEN FOR MEDICAL PURPOSES ONLY.  IF CONFIRMATION IS NEEDED FOR ANY PURPOSE, NOTIFY LAB WITHIN 5 DAYS.        LOWEST DETECTABLE LIMITS FOR URINE DRUG SCREEN Drug Class       Cutoff (ng/mL) Amphetamine      1000 Barbiturate      200 Benzodiazepine   200 Tricyclics       300 Opiates          300 Cocaine          300 THC  50 10/25/2008 0344    Alcohol Level: No results found for this basename: ETH:2 in the last 72 hours  No results found for this or any previous visit (from the past 240 hour(s)).  Studies/Results: No results found.  Medications: Scheduled Meds:   . acetaminophen  650 mg Oral Q6H   Or  . acetaminophen  650 mg Rectal Q6H  . albuterol  2.5 mg Nebulization Q4H WA  . amLODipine  5 mg Oral Daily  . azithromycin  500 mg Intravenous Q24H  . buPROPion  300 mg Oral Q0700  . fluticasone  2 puff Inhalation BID  . guaiFENesin  1,200 mg Oral BID  . ipratropium  0.5 mg Nebulization Q4H WA  . labetalol  200 mg Oral BID  .  methylPREDNISolone (SOLU-MEDROL) injection  80 mg Intravenous Q12H  . metoCLOPramide  10 mg Oral TID AC & HS  . omega-3 acid ethyl esters  2 capsule Oral Q0700  . pantoprazole  40 mg Oral Q1200  . DISCONTD: enoxaparin  40 mg Subcutaneous Daily  . DISCONTD: methylPREDNISolone (SOLU-MEDROL) injection  80 mg Intravenous Q6H  . DISCONTD: pantoprazole  40 mg Oral Q1200  . DISCONTD: pantoprazole (PROTONIX) IV  40 mg Intravenous Once   Continuous Infusions:  PRN Meds:.albuterol, alum & mag hydroxide-simeth, benzonatate, cyclobenzaprine, HYDROmorphone, ondansetron (ZOFRAN) IV, ondansetron, oxyCODONE, traZODone  Assessment/Plan:  Active Problems:  COPD (chronic obstructive pulmonary disease)  Hypertension  GERD (gastroesophageal reflux disease)  Anxiety disorder  Chronic pain syndrome  Asthma  Chronic obstructive asthma with exacerbation  Plan:  Will need to continue with steroids and nebs for now, resp status is slow to improve, although she is on room air now.  Regarding her dysphagia, we will increase her PPI to BID Plans are for upper endoscopy in the morning.  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  Her blood pressure is under fair control  Dispo, can likely d/c home after GI work up complete   LOS: 3 days   Mattheu Brodersen [facility] 07/30/2011, 11:15 AM

## 2011-07-30 NOTE — Consult Note (Signed)
NAMEETTY, ISAAC NO.:  [id number]  MEDICAL RECORD NO.:  [id number]  LOCATION:  A321                          FACILITY:  [facility]  PHYSICIAN:  Lionel December, M.D.    DATE OF BIRTH:  [birth year]  DATE OF CONSULTATION:  07/30/2011 DATE OF DISCHARGE:                                CONSULTATION   REASON FOR CONSULTATION:  Solid food dysphagia.  HISTORY OF PRESENT ILLNESS:  Blessyn is a 46 year old Caucasian female, who was admitted to this facility with exacerbation of her COPD on July 27, 2010.  She is being treated with antibiotics and bronchodilators as well as steroids and gradually improving.  Yesterday, the patient had difficulty swallowing her food.  Therefore, consultation was requested.  The patient has history of dysphagia.  She initially had her esophagus dilated several years ago and more recently in April last year when she was admitted with respiratory problems.  She underwent EGD which revealed erosive reflux esophagitis, small sliding hiatal hernia and esophageal valve which was dilated by passing 54-French Maloney dilator. She also had erosive antral gastritis, but her H. pylori serology was negative.  The patient states that she has been swallowing fine until few weeks ago.  She has not been taking her PPI as recommended.  She points to her suprasternal area as site of bolus obstruction.  She has difficulty with all solids, but not with liquids.  She denies abdominal pain, melena or rectal bleeding.  Few months ago, she did have a single episode of hematochezia and was seen by her physician and felt to be bleeding from local irritation or hemorrhoid, etc.  The patient denies dyspnea at rest.  She is coughing up greenish-yellow sputum.  She states the breathing is better, but not back to normal.  At home, she has been on: 1. Amlodipine 5 mg p.o. daily. 2. Bupropion 300 mg p.o. daily. 3. Diazepam 10 mg p.o. daily p.r.n. 4. Fish oil 2 g p.o.  q.a.m. 5. Norco 10/325, 1 tablet every 6 h. as needed. 6. Labetalol 200 mg p.o. b.i.d. 7. Mirtazapine 15 mg p.o. at bedtime p.r.n. 8. Albuterol inhaler 2 puffs q.6 p.r.n. 9. Albuterol neb every 6 h. as needed.  Current medications include: 1. Albuterol neb every 4 h. while awake. 2. Amlodipine 5 mg p.o. daily. 3. Azithromycin 500 mg IV q.24 h. 4. Methylprednisolone 80 mg IV q.12 h. 5. Metoclopramide 10 mg before each meal p.o. a.c. and at bedtime. 6. Bupropion 300 mg p.o. q.a.m. 7. Flovent inhaler 44 mcg/hour 2 puffs b.i.d. 8. Mucinex 1.2 g p.o. b.i.d. 9. Ipratropium neb every 4 h. as neb unit dose every 4 h. while awake. 10.Labetalol 200 mg p.o. b.i.d. 11.Fish oil 2 g p.o. daily. 12.Pantoprazole 40 mg p.o. q.a.m.  P.r.n. medications include Maalox, Benzonatate, Flexeril, hydrocodone, Zofran, oxycodone and trazodone.  PAST MEDICAL HISTORY:  History of esophageal problems as above.  Longstanding bronchial asthma/COPD.  History of anxiety.  Hypertension.  Chronic low back pain.  PAST SURGICAL HISTORY:  Prior surgeries include 2 C sections, tubal ligation.  Decompression of right carpal tunnel syndrome.  She also has had cholecystectomy.  About 3 years ago, she had endometrial ablation  by Dr. Emelda Fear for excessive bleeding.  ALLERGIES:  NK.  FAMILY HISTORY:  Father died at age 3 of multiple myeloma within few months of diagnosis.  Mother is doing well.  She has coronary artery disease as well as diabetes.  She has 1 sister in good health.  Her brother committed suicide at age 68 or 42.  SOCIAL HISTORY:  She is presently not working. She is married. One pregnancy ended up with fetal loss at 37 weeks due to Vasa previa.  She has a healthy 71-1/2-year-old.  She smoked for years, but quit 8 months ago.  She states she only smokes when she gets upset or stressed out and that is no more than 2-3 cigarettes per month.  She does not drink alcohol.  OBJECTIVE:  VITAL SIGNS:   Admission weight 154 pounds.  She is 64 inches tall.  Pulse 74 per minute, blood pressure 157/96, respiratory rate is 18 and temp is 97.8. EYES:  Conjunctivae are pink.  Sclerae are nonicteric. MOUTH:  Oropharyngeal mucosa is normal. NECK:  No neck masses or thyromegaly noted. CARDIAC:  Regular rhythm.  Normal S1 and S2.  No murmur or gallop noted. LUNGS:  Auscultation of the lungs reveal diffuse rhonchi bilaterally. ABDOMEN:  Symmetrical soft and nontender with no organomegaly or masses. EXTREMITIES:  No peripheral edema or clubbing noted.  LABORATORY DATA FROM ADMISSION:  WBC 11.5, H and H 12.8 and 38.6, platelet count 302 K.  Serum sodium 136, potassium 3.6, chloride 105, CO2 24, glucose 108, BUN 12, creatinine 0.57.  Bilirubin 0.3, AP 93, AST 14, ALT 14, total protein 7.5 with albumin of 3.6.  Calcium 9.6.  Chest film on admission revealed bronchitic changes and hyperinflation.  ASSESSMENT:  Lucile is a 46 year old Caucasian female who is undergoing therapy for exacerbation of her chronic obstructive pulmonary disease/asthma and still with bilateral rhonchi and productive cough. She complains of solid food dysphagia.  She has history of erosive esophagitis and esophageal web, but she has not been on her PPI until this admission.  She is also at risk for Candida esophagitis.  It is possible that her dysphagia may have resulted in her respiratory illness.  She will need to have her upper gastrointestinal tract re- evaluated and esophagus dilated.  RECOMMENDATIONS:  Agree with continuing with PPI.  We will tentatively schedule her for EGD and possible ED in a.m. unless her bronchospasm gets worse.  We appreciate the opportunity to participate in the care of this nice lady.          ______________________________ Lionel December, M.D.     NR/MEDQ  D:  07/30/2011  T:  07/30/2011  Job:  [id number]

## 2011-07-30 NOTE — Consult Note (Signed)
Note dictated; Job [phone].

## 2011-07-31 ENCOUNTER — Encounter (HOSPITAL_COMMUNITY)
Admission: EM | Disposition: A | Discharge status: Home / Self Care | Payer: Self-pay | Source: Home / Self Care | Attending: Internal Medicine

## 2011-07-31 ENCOUNTER — Other Ambulatory Visit (INDEPENDENT_AMBULATORY_CARE_PROVIDER_SITE_OTHER): Payer: Self-pay | Admitting: Internal Medicine

## 2011-07-31 DIAGNOSIS — K2211 Ulcer of esophagus with bleeding: Secondary | ICD-10-CM

## 2011-07-31 DIAGNOSIS — K208 Other esophagitis: Secondary | ICD-10-CM

## 2011-07-31 DIAGNOSIS — K449 Diaphragmatic hernia without obstruction or gangrene: Secondary | ICD-10-CM

## 2011-07-31 HISTORY — PX: MALONEY DILATION: SHX5535

## 2011-07-31 HISTORY — PX: ESOPHAGOGASTRODUODENOSCOPY: SHX5428

## 2011-07-31 SURGERY — EGD (ESOPHAGOGASTRODUODENOSCOPY)
Anesthesia: Moderate Sedation

## 2011-07-31 MED ORDER — MEPERIDINE HCL 50 MG/ML IJ SOLN
INTRAMUSCULAR | Status: AC
  Filled 2011-07-31: qty 2

## 2011-07-31 MED ORDER — FLUTICASONE PROPIONATE HFA 44 MCG/ACT IN AERO: 2.0000 | INHALATION_SPRAY | Freq: Two times a day (BID) | RESPIRATORY_TRACT | Status: DC

## 2011-07-31 MED ORDER — MIDAZOLAM HCL 5 MG/5ML IJ SOLN
INTRAMUSCULAR | Status: AC
  Filled 2011-07-31: qty 10

## 2011-07-31 MED ORDER — MEPERIDINE HCL 25 MG/ML IJ SOLN
INTRAMUSCULAR | Status: DC | PRN
  Administered 2011-07-31 (×2): 25 mg via INTRAVENOUS

## 2011-07-31 MED ORDER — PANTOPRAZOLE SODIUM 40 MG PO TBEC: 40.0000 mg | DELAYED_RELEASE_TABLET | Freq: Two times a day (BID) | ORAL | Status: DC

## 2011-07-31 MED ORDER — ALBUTEROL SULFATE HFA 108 (90 BASE) MCG/ACT IN AERS: 2.0000 | INHALATION_SPRAY | Freq: Four times a day (QID) | RESPIRATORY_TRACT | Status: AC | PRN

## 2011-07-31 MED ORDER — BUTAMBEN-TETRACAINE-BENZOCAINE 2-2-14 % EX AERO
INHALATION_SPRAY | CUTANEOUS | Status: DC | PRN
  Administered 2011-07-31: 2 via TOPICAL

## 2011-07-31 MED ORDER — PREDNISONE (PAK) 10 MG PO TABS: ORAL_TABLET | ORAL | Status: DC

## 2011-07-31 MED ORDER — POLYETHYLENE GLYCOL 3350 17 G PO PACK
17.0000 g | PACK | Freq: Every day | ORAL | Status: DC
  Administered 2011-07-31: 17 g via ORAL
  Filled 2011-07-31: qty 1

## 2011-07-31 MED ORDER — NICOTINE 14 MG/24HR TD PT24: 1.0000 | MEDICATED_PATCH | TRANSDERMAL | Status: AC

## 2011-07-31 MED ORDER — AZITHROMYCIN 250 MG PO TABS: 500.0000 mg | ORAL_TABLET | Freq: Every day | ORAL | Status: DC

## 2011-07-31 MED ORDER — LISINOPRIL 20 MG PO TABS: 20.0000 mg | ORAL_TABLET | Freq: Every day | ORAL | Status: AC

## 2011-07-31 MED ORDER — BENZONATATE 200 MG PO CAPS: 200.0000 mg | ORAL_CAPSULE | Freq: Three times a day (TID) | ORAL | Status: AC | PRN

## 2011-07-31 MED ORDER — MIDAZOLAM HCL 5 MG/5ML IJ SOLN
INTRAMUSCULAR | Status: DC | PRN
  Administered 2011-07-31 (×5): 2 mg via INTRAVENOUS

## 2011-07-31 MED ORDER — PHENOL 1.4 % MT LIQD
1.0000 | OROMUCOSAL | Status: DC | PRN
  Administered 2011-07-31: 1 via OROMUCOSAL
  Filled 2011-07-31: qty 177

## 2011-07-31 MED ORDER — GUAIFENESIN ER 600 MG PO TB12: 1200.0000 mg | ORAL_TABLET | Freq: Two times a day (BID) | ORAL | Status: DC

## 2011-07-31 NOTE — Discharge Summary (Signed)
Physician Discharge Summary  Patient ID: [name] MRN: [id number] DOB/AGE: 03-16-1966 46 y.o.  Admit date: 07/27/2011 Discharge date: 07/31/2011  Primary Care Physician:  Cassell Smiles., MD, MD   Discharge Diagnoses:    Active Problems:  COPD (chronic obstructive pulmonary disease)  Hypertension  GERD (gastroesophageal reflux disease)  Anxiety disorder  Chronic pain syndrome  Asthma  Chronic obstructive asthma with exacerbation Esophageal web s/p Dilatation   Current Discharge Medication List    START taking these medications   Details  benzonatate (TESSALON) 200 MG capsule Take 1 capsule (200 mg total) by mouth 3 (three) times daily as needed for cough. Qty: 20 capsule, Refills: 0    fluticasone (FLOVENT HFA) 44 MCG/ACT inhaler Inhale 2 puffs into the lungs 2 (two) times daily. Qty: 1 Inhaler, Refills: 1    guaiFENesin (MUCINEX) 600 MG 12 hr tablet Take 2 tablets (1,200 mg total) by mouth 2 (two) times daily. Qty: 14 tablet, Refills: 0    lisinopril (PRINIVIL,ZESTRIL) 20 MG tablet Take 1 tablet (20 mg total) by mouth daily. Qty: 30 tablet, Refills: 0    nicotine ([employer] NICOTINE TRANSDERMAL SYS) 14 mg/24hr patch Place 1 patch onto the skin daily. Qty: 28 patch, Refills: 0    pantoprazole (PROTONIX) 40 MG tablet Take 1 tablet (40 mg total) by mouth 2 (two) times daily before a meal. Qty: 30 tablet, Refills: 1    predniSONE (STERAPRED UNI-PAK) 10 MG tablet Take 40mg  po daily for 2 days then 30mg  po daily for 2 days then 20mg  po daily for 2 days then 10mg  po daily for 2 days then stop Qty: 20 tablet, Refills: 0      CONTINUE these medications which have CHANGED   Details  albuterol (PROVENTIL HFA;VENTOLIN HFA) 108 (90 BASE) MCG/ACT inhaler Inhale 2 puffs into the lungs every 6 (six) hours as needed for wheezing or shortness of breath. For shortness of breath Qty: 1 Inhaler, Refills: 1      CONTINUE these medications which have NOT CHANGED   Details    albuterol (PROVENTIL) (5 MG/ML) 0.5% nebulizer solution Take 2.5 mg by nebulization every 6 (six) hours as needed. For shortness of breath    albuterol (PROVENTIL) 2 MG tablet Take 2 mg by mouth 4 (four) times daily.    amLODipine (NORVASC) 5 MG tablet Take 5 mg by mouth daily.      buPROPion (WELLBUTRIN XL) 300 MG 24 hr tablet Take 300 mg by mouth every morning.      diazepam (VALIUM) 10 MG tablet Take 10 mg by mouth daily as needed. For spasms/anxiety     fish oil-omega-3 fatty acids 1000 MG capsule Take 2 g by mouth every morning.      HYDROcodone-acetaminophen (NORCO) 10-325 MG per tablet Take 1 tablet by mouth every 6 (six) hours as needed. For pain     labetalol (NORMODYNE) 200 MG tablet Take 200 mg by mouth 2 (two) times daily.      mirtazapine (REMERON) 15 MG tablet Take 15 mg by mouth at bedtime as needed. For sleep      STOP taking these medications     Phenyleph-CPM-DM-APAP (TYLENOL COLD HEAD CONGESTION PO)        Discharge Exam: Blood pressure 168/102, pulse 78, temperature 98.1 F (36.7 C), temperature source Oral, resp. rate 14, height 5' 4.5" (1.638 m), weight 70.4 kg (155 lb 3.3 oz), SpO2 98.00%. NAD Mild b/l exp wheezes S1, S2, RRR Soft, NT, BS+ No edema b/l  Disposition  and Follow-up:  Follow up with Dr. Sherwood Gambler on Friday as scheduled  Consults:  Gastroenterology, Dr. Karilyn Cota   Significant Diagnostic Studies:  Dg Chest 2 View  07/28/2011  *RADIOLOGY REPORT*  Clinical Data: Asthma, shortness of breath, congestion, chest.  CHEST - 2 VIEW  Comparison: 11/04/2010  Findings: Normal heart size, mediastinal contours, and pulmonary vascularity. Hyperinflation and mild peribronchial thickening. No pulmonary infiltrate, pleural effusion or pneumothorax. Bones appear demineralized.  IMPRESSION: Bronchitic changes and hyperinflation. No acute abnormalities.  Original Report Authenticated By: Lollie Marrow, M.D.    Brief H and P: For complete details please refer to  admission H and P, but in brief [name] is an 46 y.o. Caucasian female. Asthma since childhood; 30 years of tobacco abuse, discontinued 8 months ago; maintained on albuterol inhalers, nebulizer, and tablets at home. Uses her inhaler frequently during the day, every day, sustained attacks of asthma about every 2 weeks.  Since Friday night the patient had persistent wheezing cough productive of white sputum, unrelieved by frequent use of her home albuterol nebulizer. Initially she is become so weak and short of breath she presented to the emergency room she has had serial nebulizations with albuterol and ipratropium and although much improved as continued to wheeze.  She denies fever; she had a flu vaccine 2 months ago; he can't remember if she's ever had pneumonia vaccine.  She complains of severe chest pain because of the persistent coughing.  Also has a history GERD, status post esophageal dilation esophageal webs.     Hospital Course:  #1. Exacerbation of asthma/COPD. Patient was treated with intravenous steroids, nebulization treatments, mucolytic, antibiotics. Since her admission her respiratory status has improved. She does have some mild expiratory wheezes but I suspect this is her baseline. She is breathing comfortably on room air. She is ambulating without difficulty. She is placed on a steroid taper. She's completed 5 days of antibiotics. She's been advised to abstain from smoking. #2 hypertension possibly exacerbated by steroid use. We will add lisinopril to her medication regimen which includes Norvasc and labetalol. #3 dysphasia patient describes a significant dysphagia and is unable to swallow. She's had esophageal dilatations in the past. She was seen in consultation by Dr. Karilyn Cota from gastroenterology. She underwent upper endoscopy which showed 3 superficial ulcers esophageal body with question of pill esophagitis. Biopsies were taken. It also showed erosive antral gastritis.  Recommendations were to continue with proton pump inhibitor. Her esophagus was dilated. #4 tobacco use, patient has requested nicotine patches, she will be provided with a prescription on discharge.  Patient is significantly improved since admission. She is breathing comfortably on room air. She has followup with her primary care physician this Friday. She is felt stable to discharge home. Time spent on Discharge:  Signed: MEMON,JEHANZEB [facility] 07/31/2011, 1:10 PM

## 2011-07-31 NOTE — Op Note (Signed)
EGD PROCEDURE REPORT  PATIENT:  [name]  MR#:  [id number] Birthdate:  [birth year], 46 y.o., female Endoscopist:  Dr. Malissa Hippo, MD Referred By:  Dr. [name], MD Procedure Date: 07/31/2011  Procedure:   EGD with ED.  Indications:  Patient is 46 year old Caucasian female who is undergoing treatment for excess ablation of COPD/asthma was complaining of solid food dysphagia. He has chronic GERD and history of esophageal her last esophageal dilation was in April 2012. She has not been taking PPI as recommended.            Informed Consent:  The risks, benefits, alternatives & imponderables which include, but are not limited to, bleeding, infection, perforation, drug reaction and potential missed lesion have been reviewed.  The potential for biopsy, lesion removal, esophageal dilation, etc. have also been discussed.  Questions have been answered.  All parties agreeable.  Please see history & physical in medical record for more information.  Medications:  Demerol 50 mg IV Versed 10 mg IV Cetacaine spray topically for oropharyngeal anesthesia  Description of procedure:  The endoscope was introduced through the mouth and advanced to the second portion of the duodenum without difficulty or limitations. The mucosal surfaces were surveyed very carefully during advancement of the scope and upon withdrawal.  Findings:  Esophagus:  Mucosa of the proximal esophagus was normal. At mid-esophagus there were 3 superficial ulcers. Single erosion also noted at GE junction but no ring or stricture was identified. GEJ:  36 cm Hiatus:  38 cm Stomach:  Stomach was empty and distended very well with insufflation. Folds in the proximal stomach were normal. Examination of mucosa revealed linear antral erosions but no ulcer crater was found. Pyloric channel was patent. Angularis fundus and cardia were examined by retroflexing the scope and were normal. Duodenum:  Normal bulbar and post bulbar  mucosa.  Therapeutic/Diagnostic Maneuvers Performed:  Esophagus dilated by passing 54 French Maloney dilator to full insertion. Esophageal mucosa was reexamined post dilation and small linear tear noted at cervical esophagus indicative of disrupted web. Biopsy was taken from superficial ulcers at body of esophagus and endoscope withdrawn.  Complications:  None  Impression: 3 superficial ulcers at esophageal body.? Pill esophagitis. Biopsy taken. Small sliding-type hernia with erosion at GE junction secondary to GERD. Erosive antral gastritis. Please note H. pylori serology  was negative in April 2012. Esophagus dilated by passing 54 French Maloney dilator resulting in linear tear at proximal esophagus indicative of disrupted web  Recommendations:  Continue anti-reflux measures and PPI. H. pylori stool antigen.  Jalaysha Skilton U  07/31/2011  7:21 AM  CC: Dr. Cassell Smiles., MD, MD & Dr. Bonnetta Barry ref. provider found

## 2011-07-31 NOTE — Progress Notes (Signed)
PHARMACIST - PHYSICIAN COMMUNICATION DR:   Memon CONCERNING: Antibiotic IV to Oral Route Change Policy  RECOMMENDATION: This patient is receiving Zithromax by the intravenous route.  Based on criteria approved by the Pharmacy and Therapeutics Committee, the antibiotic(s) is/are being converted to the equivalent oral dose form(s).  DESCRIPTION: These criteria include:  Patient being treated for a respiratory tract infection, urinary tract infection, or cellulitis  The patient is not neutropenic and does not exhibit a GI malabsorption state  The patient is eating (either orally or via tube) and/or has been taking other orally administered medications for a least 24 hours  The patient is improving clinically and has a Tmax < 100.5  If you have questions about this conversion, please contact the Pharmacy Department  [x]  ( [phone] )  [place] []  ( [phone]  [place]  []  ( [phone]  [facility] []  ( [phone]  [facility]   S. Tateanna Bach, PharmD  

## 2011-08-02 ENCOUNTER — Encounter (HOSPITAL_COMMUNITY): Payer: Self-pay | Admitting: Internal Medicine

## 2011-08-29 ENCOUNTER — Emergency Department (HOSPITAL_COMMUNITY): Payer: Medicaid Other

## 2011-08-29 ENCOUNTER — Other Ambulatory Visit: Payer: Self-pay

## 2011-08-29 ENCOUNTER — Inpatient Hospital Stay (HOSPITAL_COMMUNITY)
Admission: EM | Admit: 2011-08-29 | Discharge: 2011-08-30 | DRG: 192 | Disposition: A | Discharge status: Home / Self Care | Payer: Medicaid Other | Attending: Internal Medicine | Admitting: Internal Medicine

## 2011-08-29 ENCOUNTER — Encounter (HOSPITAL_COMMUNITY): Payer: Self-pay

## 2011-08-29 DIAGNOSIS — F3289 Other specified depressive episodes: Secondary | ICD-10-CM | POA: Diagnosis present

## 2011-08-29 DIAGNOSIS — F419 Anxiety disorder, unspecified: Secondary | ICD-10-CM

## 2011-08-29 DIAGNOSIS — Z79899 Other long term (current) drug therapy: Secondary | ICD-10-CM

## 2011-08-29 DIAGNOSIS — G894 Chronic pain syndrome: Secondary | ICD-10-CM | POA: Diagnosis present

## 2011-08-29 DIAGNOSIS — R0789 Other chest pain: Secondary | ICD-10-CM | POA: Diagnosis present

## 2011-08-29 DIAGNOSIS — E876 Hypokalemia: Secondary | ICD-10-CM | POA: Diagnosis present

## 2011-08-29 DIAGNOSIS — I10 Essential (primary) hypertension: Secondary | ICD-10-CM | POA: Diagnosis present

## 2011-08-29 DIAGNOSIS — F411 Generalized anxiety disorder: Secondary | ICD-10-CM | POA: Diagnosis present

## 2011-08-29 DIAGNOSIS — K219 Gastro-esophageal reflux disease without esophagitis: Secondary | ICD-10-CM | POA: Diagnosis present

## 2011-08-29 DIAGNOSIS — F329 Major depressive disorder, single episode, unspecified: Secondary | ICD-10-CM | POA: Diagnosis present

## 2011-08-29 DIAGNOSIS — J441 Chronic obstructive pulmonary disease with (acute) exacerbation: Principal | ICD-10-CM | POA: Diagnosis present

## 2011-08-29 DIAGNOSIS — J449 Chronic obstructive pulmonary disease, unspecified: Secondary | ICD-10-CM

## 2011-08-29 LAB — DIFFERENTIAL
Basophils Relative: 1 % (ref 0–1)
Eosinophils Absolute: 0.4 10*3/uL (ref 0.0–0.7)
Eosinophils Relative: 4 % (ref 0–5)
Monocytes Absolute: 0.6 10*3/uL (ref 0.1–1.0)
Monocytes Relative: 7 % (ref 3–12)

## 2011-08-29 LAB — BASIC METABOLIC PANEL
BUN: 6 mg/dL (ref 6–23)
Calcium: 9.4 mg/dL (ref 8.4–10.5)
Chloride: 104 mEq/L (ref 96–112)
Creatinine, Ser: 0.7 mg/dL (ref 0.50–1.10)
GFR calc Af Amer: >90 mL/min (ref >90)
GFR calc non Af Amer: >90 mL/min (ref >90)

## 2011-08-29 LAB — CBC
HCT: 39.2 % (ref 36.0–46.0)
Hemoglobin: 13.4 g/dL (ref 12.0–15.0)
MCH: 32.7 pg (ref 26.0–34.0)
MCHC: 34.2 g/dL (ref 30.0–36.0)
RDW: 13.6 % (ref 11.5–15.5)

## 2011-08-29 LAB — MAGNESIUM: Magnesium: 1.8 mg/dL (ref 1.5–2.5)

## 2011-08-29 MED ORDER — LORAZEPAM 2 MG/ML IJ SOLN
1.0000 mg | Freq: Once | INTRAMUSCULAR | Status: AC
  Administered 2011-08-29: 1 mg via INTRAVENOUS

## 2011-08-29 MED ORDER — PREDNISONE 20 MG PO TABS
40.0000 mg | ORAL_TABLET | Freq: Two times a day (BID) | ORAL | Status: DC
  Administered 2011-08-29 – 2011-08-30 (×3): 40 mg via ORAL
  Filled 2011-08-29 (×3): qty 2

## 2011-08-29 MED ORDER — HYDROCODONE-HOMATROPINE 5-1.5 MG/5ML PO SYRP: 5.0000 mL | ORAL_SOLUTION | Freq: Every evening | ORAL | Status: DC | PRN

## 2011-08-29 MED ORDER — MORPHINE SULFATE 4 MG/ML IJ SOLN
INTRAMUSCULAR | Status: AC
  Filled 2011-08-29: qty 1

## 2011-08-29 MED ORDER — ENOXAPARIN SODIUM 40 MG/0.4ML ~~LOC~~ SOLN
40.0000 mg | SUBCUTANEOUS | Status: DC
  Administered 2011-08-29: 40 mg via SUBCUTANEOUS
  Filled 2011-08-29: qty 0.4

## 2011-08-29 MED ORDER — MORPHINE SULFATE 4 MG/ML IJ SOLN
4.0000 mg | Freq: Once | INTRAMUSCULAR | Status: AC
  Administered 2011-08-29: 4 mg via INTRAVENOUS

## 2011-08-29 MED ORDER — ACETAMINOPHEN 650 MG RE SUPP: 650.0000 mg | Freq: Four times a day (QID) | RECTAL | Status: DC | PRN

## 2011-08-29 MED ORDER — ALBUTEROL (5 MG/ML) CONTINUOUS INHALATION SOLN
10.0000 mg/h | INHALATION_SOLUTION | Freq: Once | RESPIRATORY_TRACT | Status: AC
  Administered 2011-08-29: 10 mg/h via RESPIRATORY_TRACT
  Filled 2011-08-29: qty 20

## 2011-08-29 MED ORDER — ACETAMINOPHEN 325 MG PO TABS: 650.0000 mg | ORAL_TABLET | Freq: Four times a day (QID) | ORAL | Status: DC | PRN

## 2011-08-29 MED ORDER — AMLODIPINE BESYLATE 5 MG PO TABS
5.0000 mg | ORAL_TABLET | Freq: Every day | ORAL | Status: DC
  Administered 2011-08-29 – 2011-08-30 (×2): 5 mg via ORAL
  Filled 2011-08-29 (×2): qty 1

## 2011-08-29 MED ORDER — ONDANSETRON HCL 4 MG/2ML IJ SOLN: 4.0000 mg | Freq: Four times a day (QID) | INTRAMUSCULAR | Status: DC | PRN

## 2011-08-29 MED ORDER — ONDANSETRON HCL 4 MG PO TABS: 4.0000 mg | ORAL_TABLET | Freq: Four times a day (QID) | ORAL | Status: DC | PRN

## 2011-08-29 MED ORDER — POTASSIUM CHLORIDE CRYS ER 20 MEQ PO TBCR
40.0000 meq | EXTENDED_RELEASE_TABLET | Freq: Once | ORAL | Status: AC
  Administered 2011-08-29: 40 meq via ORAL
  Filled 2011-08-29: qty 2

## 2011-08-29 MED ORDER — MAGNESIUM HYDROXIDE 400 MG/5ML PO SUSP: 30.0000 mL | Freq: Every day | ORAL | Status: DC | PRN

## 2011-08-29 MED ORDER — ALBUTEROL SULFATE 2 MG PO TABS
2.0000 mg | ORAL_TABLET | Freq: Four times a day (QID) | ORAL | Status: DC
Start: 2011-08-29 — End: 2011-08-30
  Administered 2011-08-29: 2 mg via ORAL
  Filled 2011-08-29 (×17): qty 1

## 2011-08-29 MED ORDER — IPRATROPIUM BROMIDE 0.02 % IN SOLN
0.5000 mg | Freq: Once | RESPIRATORY_TRACT | Status: AC
  Administered 2011-08-29: 0.5 mg via RESPIRATORY_TRACT
  Filled 2011-08-29: qty 2.5

## 2011-08-29 MED ORDER — LORAZEPAM 2 MG/ML IJ SOLN
1.0000 mg | Freq: Once | INTRAMUSCULAR | Status: AC
  Administered 2011-08-29: 1 mg via INTRAVENOUS
  Filled 2011-08-29: qty 1

## 2011-08-29 MED ORDER — FLUTICASONE PROPIONATE HFA 44 MCG/ACT IN AERO
2.0000 | INHALATION_SPRAY | Freq: Two times a day (BID) | RESPIRATORY_TRACT | Status: DC
  Administered 2011-08-29 – 2011-08-30 (×2): 2 via RESPIRATORY_TRACT
  Filled 2011-08-29: qty 10.6

## 2011-08-29 MED ORDER — IPRATROPIUM BROMIDE 0.02 % IN SOLN
0.5000 mg | Freq: Four times a day (QID) | RESPIRATORY_TRACT | Status: DC
  Administered 2011-08-29 – 2011-08-30 (×4): 0.5 mg via RESPIRATORY_TRACT
  Filled 2011-08-29 (×4): qty 2.5

## 2011-08-29 MED ORDER — DOCUSATE SODIUM 100 MG PO CAPS
100.0000 mg | ORAL_CAPSULE | Freq: Two times a day (BID) | ORAL | Status: DC
  Administered 2011-08-29 – 2011-08-30 (×3): 100 mg via ORAL
  Filled 2011-08-29 (×3): qty 1

## 2011-08-29 MED ORDER — DOXYCYCLINE HYCLATE 100 MG PO TABS
100.0000 mg | ORAL_TABLET | Freq: Two times a day (BID) | ORAL | Status: DC
  Administered 2011-08-29 – 2011-08-30 (×3): 100 mg via ORAL
  Filled 2011-08-29 (×3): qty 1

## 2011-08-29 MED ORDER — GUAIFENESIN ER 600 MG PO TB12
1200.0000 mg | ORAL_TABLET | Freq: Two times a day (BID) | ORAL | Status: DC
  Administered 2011-08-29 – 2011-08-30 (×3): 1200 mg via ORAL
  Filled 2011-08-29 (×3): qty 2

## 2011-08-29 MED ORDER — ALBUTEROL SULFATE (5 MG/ML) 0.5% IN NEBU
2.5000 mg | INHALATION_SOLUTION | RESPIRATORY_TRACT | Status: DC | PRN
  Administered 2011-08-30 (×2): 2.5 mg via RESPIRATORY_TRACT
  Filled 2011-08-29 (×2): qty 0.5

## 2011-08-29 MED ORDER — OMEGA-3 FATTY ACIDS 1000 MG PO CAPS: 2.0000 g | ORAL_CAPSULE | ORAL | Status: DC

## 2011-08-29 MED ORDER — SODIUM CHLORIDE 0.9 % IJ SOLN
3.0000 mL | Freq: Two times a day (BID) | INTRAMUSCULAR | Status: DC
  Administered 2011-08-29 – 2011-08-30 (×3): 3 mL via INTRAVENOUS
  Filled 2011-08-29 (×3): qty 3

## 2011-08-29 MED ORDER — BENZONATATE 100 MG PO CAPS
200.0000 mg | ORAL_CAPSULE | Freq: Three times a day (TID) | ORAL | Status: DC
  Administered 2011-08-29 – 2011-08-30 (×3): 200 mg via ORAL
  Filled 2011-08-29 (×3): qty 2

## 2011-08-29 MED ORDER — ALBUTEROL SULFATE (5 MG/ML) 0.5% IN NEBU
5.0000 mg | INHALATION_SOLUTION | Freq: Once | RESPIRATORY_TRACT | Status: AC
  Administered 2011-08-29: 5 mg via RESPIRATORY_TRACT
  Filled 2011-08-29: qty 1

## 2011-08-29 MED ORDER — BUPROPION HCL ER (XL) 300 MG PO TB24
300.0000 mg | ORAL_TABLET | ORAL | Status: DC
  Administered 2011-08-30: 300 mg via ORAL
  Filled 2011-08-29 (×4): qty 1

## 2011-08-29 MED ORDER — MORPHINE SULFATE 4 MG/ML IJ SOLN
4.0000 mg | Freq: Once | INTRAMUSCULAR | Status: AC
  Administered 2011-08-29: 4 mg via INTRAVENOUS
  Filled 2011-08-29: qty 1

## 2011-08-29 MED ORDER — LISINOPRIL 10 MG PO TABS
20.0000 mg | ORAL_TABLET | Freq: Every day | ORAL | Status: DC
  Administered 2011-08-29 – 2011-08-30 (×2): 20 mg via ORAL
  Filled 2011-08-29 (×2): qty 2

## 2011-08-29 MED ORDER — ALBUTEROL SULFATE (5 MG/ML) 0.5% IN NEBU
2.5000 mg | INHALATION_SOLUTION | Freq: Four times a day (QID) | RESPIRATORY_TRACT | Status: AC
  Administered 2011-08-29 (×2): 2.5 mg via RESPIRATORY_TRACT
  Filled 2011-08-29 (×2): qty 0.5

## 2011-08-29 MED ORDER — DIAZEPAM 5 MG PO TABS
5.0000 mg | ORAL_TABLET | Freq: Three times a day (TID) | ORAL | Status: DC | PRN
  Administered 2011-08-29 – 2011-08-30 (×2): 5 mg via ORAL
  Filled 2011-08-29 (×2): qty 1

## 2011-08-29 MED ORDER — PANTOPRAZOLE SODIUM 40 MG PO TBEC
40.0000 mg | DELAYED_RELEASE_TABLET | Freq: Two times a day (BID) | ORAL | Status: DC
  Administered 2011-08-29 – 2011-08-30 (×2): 40 mg via ORAL
  Filled 2011-08-29 (×2): qty 1

## 2011-08-29 MED ORDER — OMEGA-3-ACID ETHYL ESTERS 1 G PO CAPS
2.0000 g | ORAL_CAPSULE | ORAL | Status: DC
  Administered 2011-08-30: 2 g via ORAL
  Filled 2011-08-29: qty 2

## 2011-08-29 MED ORDER — ONDANSETRON HCL 4 MG/2ML IJ SOLN
4.0000 mg | Freq: Once | INTRAMUSCULAR | Status: AC
  Administered 2011-08-29: 4 mg via INTRAVENOUS
  Filled 2011-08-29: qty 2

## 2011-08-29 MED ORDER — HYDROCODONE-ACETAMINOPHEN 10-325 MG PO TABS
1.0000 | ORAL_TABLET | Freq: Four times a day (QID) | ORAL | Status: DC | PRN
  Administered 2011-08-29 – 2011-08-30 (×3): 1 via ORAL
  Filled 2011-08-29 (×3): qty 1

## 2011-08-29 MED ORDER — MIRTAZAPINE 30 MG PO TABS: 15.0000 mg | ORAL_TABLET | Freq: Every day | ORAL | Status: DC

## 2011-08-29 MED ORDER — ALBUTEROL SULFATE (5 MG/ML) 0.5% IN NEBU: 2.5000 mg | INHALATION_SOLUTION | RESPIRATORY_TRACT | Status: DC | PRN

## 2011-08-29 MED ORDER — PREDNISONE 20 MG PO TABS
60.0000 mg | ORAL_TABLET | Freq: Once | ORAL | Status: AC
  Administered 2011-08-29: 60 mg via ORAL
  Filled 2011-08-29: qty 3

## 2011-08-29 MED ORDER — KETOROLAC TROMETHAMINE 30 MG/ML IJ SOLN
30.0000 mg | Freq: Four times a day (QID) | INTRAMUSCULAR | Status: DC | PRN
  Administered 2011-08-29 – 2011-08-30 (×2): 30 mg via INTRAVENOUS
  Filled 2011-08-29 (×2): qty 1

## 2011-08-29 MED ORDER — SODIUM CHLORIDE 0.9 % IV SOLN: INTRAVENOUS | Status: DC

## 2011-08-29 MED ORDER — KETOROLAC TROMETHAMINE 30 MG/ML IJ SOLN
30.0000 mg | Freq: Once | INTRAMUSCULAR | Status: AC
  Administered 2011-08-29: 30 mg via INTRAVENOUS
  Filled 2011-08-29: qty 1

## 2011-08-29 MED ORDER — LABETALOL HCL 200 MG PO TABS
200.0000 mg | ORAL_TABLET | Freq: Two times a day (BID) | ORAL | Status: DC
  Administered 2011-08-29 – 2011-08-30 (×3): 200 mg via ORAL
  Filled 2011-08-29 (×3): qty 1

## 2011-08-29 MED ORDER — SODIUM CHLORIDE 0.9 % IV SOLN
INTRAVENOUS | Status: DC
  Administered 2011-08-29: 09:00:00 via INTRAVENOUS

## 2011-08-29 MED ORDER — ALUM & MAG HYDROXIDE-SIMETH 200-200-20 MG/5ML PO SUSP: 30.0000 mL | Freq: Four times a day (QID) | ORAL | Status: DC | PRN

## 2011-08-29 MED ORDER — HYDROMORPHONE HCL PF 1 MG/ML IJ SOLN
1.0000 mg | INTRAMUSCULAR | Status: DC | PRN
  Administered 2011-08-29: 1 mg via INTRAVENOUS
  Filled 2011-08-29: qty 1

## 2011-08-29 MED ORDER — ONDANSETRON HCL 4 MG/2ML IJ SOLN: 4.0000 mg | Freq: Three times a day (TID) | INTRAMUSCULAR | Status: DC | PRN

## 2011-08-29 NOTE — ED Notes (Signed)
Patient requesting to speak with MD. Dr Brooke Dare aware.

## 2011-08-29 NOTE — ED Notes (Signed)
Pt reports difficulty breathing for the past few days.  Pt has been taking breathing treatments and her inhaler w/out relief.  Pt has hx of COPD and asthma.  Pt has labored breathing in triage.

## 2011-08-29 NOTE — ED Notes (Signed)
Attempted to call report to RN on floor. No answer. Will call back shortly.

## 2011-08-29 NOTE — ED Notes (Signed)
Report given to [place], RN unit 300. Ready to receive patient. Patient in x ray at this time. Will be transported to floor when returned from imaging and after pain medication administration.

## 2011-08-29 NOTE — ED Provider Notes (Addendum)
History   This chart was scribed for Dayton Bailiff, MD scribed by Magnus Sinning. The patient was seen in room APA04/APA04 seen at 8:22.    CSN: [id number]  Arrival date & time 08/29/11  4098   First MD Initiated Contact with Patient 08/29/11 [phone]      Chief Complaint  Patient presents with  . Shortness of Breath  . Asthma    (Consider location/radiation/quality/duration/timing/severity/associated sxs/prior treatment) HPI [name] is a 46 y.o. female who presents to the Emergency Department complaining of gradually worsening moderate SOB with associated dyspnea onset 2 days ago. Pt has been using in home nebulizer with no relief. Pt reports hx of asthma and COPD. Denies cough, chest tightness.  No fever.  Has some chest wall pain which began when she developed a cough.  No abd pain, n/v Past Medical History  Diagnosis Date  . Asthma   . COPD (chronic obstructive pulmonary disease)   . Hypertension   . GERD (gastroesophageal reflux disease)   . Anxiety disorder   . Chronic pain syndrome   . Anxiety   . Depression     Past Surgical History  Procedure Date  . Cholecystectomy   . Cesarean section   . Carpal tunnel release   . Esophagogastroduodenoscopy 07/31/2011    Procedure: ESOPHAGOGASTRODUODENOSCOPY (EGD);  Surgeon: Malissa Hippo, MD;  [facility]: [facility];  Service: Endoscopy;  Laterality: N/A;  Elease Hashimoto dilation 07/31/2011    Procedure: Elease Hashimoto DILATION;  Surgeon: Malissa Hippo, MD;  Location: [facility];  Service: Endoscopy;;    Family History  Problem Relation Age of Onset  . Asthma Mother   . Asthma Father   . Cancer Father     History  Substance Use Topics  . Smoking status: Former Smoker    Types: Cigarettes    Quit date: 10/25/2010  . Smokeless tobacco: Never Used  . Alcohol Use: 1.2 oz/week    2 Cans of beer per week   Review of Systems  Respiratory: Positive for shortness of breath and wheezing. Negative for cough.   All other  systems reviewed and are negative.    Allergies  Review of patient's allergies indicates no known allergies.  Home Medications   Current Outpatient Rx  Name Route Sig Dispense Refill  . ACLIDINIUM BROMIDE 400 MCG/ACT IN AEPB Inhalation Inhale 1 puff into the lungs as needed. For shortness of breath    . ALBUTEROL SULFATE HFA 108 (90 BASE) MCG/ACT IN AERS Inhalation Inhale 2 puffs into the lungs every 6 (six) hours as needed for wheezing or shortness of breath. For shortness of breath 1 Inhaler 1  . ALBUTEROL SULFATE (2.5 MG/3ML) 0.083% IN NEBU Nebulization Take 2.5 mg by nebulization every 6 (six) hours as needed. For shortness of breath    . ALBUTEROL SULFATE 2 MG PO TABS Oral Take 2 mg by mouth 4 (four) times daily.    Marland Kitchen AMLODIPINE BESYLATE 5 MG PO TABS Oral Take 5 mg by mouth daily.      . BUPROPION HCL ER (XL) 300 MG PO TB24 Oral Take 300 mg by mouth every morning.      Marland Kitchen DIAZEPAM 10 MG PO TABS Oral Take 10 mg by mouth daily as needed. For spasms/anxiety     . OMEGA-3 FATTY ACIDS 1000 MG PO CAPS Oral Take 2 g by mouth every morning.      Marland Kitchen FLUTICASONE PROPIONATE  HFA 44 MCG/ACT IN AERO Inhalation Inhale 2 puffs into the  lungs 2 (two) times daily. 1 Inhaler 1  . GUAIFENESIN ER 600 MG PO TB12 Oral Take 2 tablets (1,200 mg total) by mouth 2 (two) times daily. 14 tablet 0  . HYDROCODONE-ACETAMINOPHEN 10-325 MG PO TABS Oral Take 1 tablet by mouth every 6 (six) hours as needed. For pain     . LABETALOL HCL 200 MG PO TABS Oral Take 200 mg by mouth 2 (two) times daily.      Marland Kitchen LISINOPRIL 20 MG PO TABS Oral Take 1 tablet (20 mg total) by mouth daily. 30 tablet 0  . NICOTINE 14 MG/24HR TD PT24 Transdermal Place 1 patch onto the skin daily. 28 patch 0  . PANTOPRAZOLE SODIUM 40 MG PO TBEC Oral Take 1 tablet (40 mg total) by mouth 2 (two) times daily before a meal. 30 tablet 1  . MIRTAZAPINE 15 MG PO TABS Oral Take 15 mg by mouth at bedtime as needed. For sleep      BP 152/97  Pulse 103   Temp(Src) 98.5 F (36.9 C) (Oral)  Resp 29  SpO2 96%  Physical Exam  Nursing note and vitals reviewed. Constitutional: She is oriented to person, place, and time. She appears well-developed and well-nourished. No distress.  HENT:  Head: Normocephalic and atraumatic.  Eyes: EOM are normal. Pupils are equal, round, and reactive to light.  Neck: Neck supple. No tracheal deviation present.  Cardiovascular: Normal rate.   Pulmonary/Chest: Tachypnea noted. She is in respiratory distress. She has wheezes. She exhibits no tenderness.       Diminished air exchanges  Abdominal: Soft. She exhibits no distension. There is no tenderness.  Musculoskeletal: Normal range of motion.  Neurological: She is alert and oriented to person, place, and time. No sensory deficit.  Skin: Skin is warm and dry.  Psychiatric: She has a normal mood and affect. Her behavior is normal.    ED Course  Procedures (including critical care time)  CRITICAL CARE Performed by: Dayton Bailiff   Total critical care time: 30 min  Critical care time was exclusive of separately billable procedures and treating other patients.  Critical care was necessary to treat or prevent imminent or life-threatening deterioration.  Critical care was time spent personally by me on the following activities: development of treatment plan with patient and/or surrogate as well as nursing, discussions with consultants, evaluation of patient's response to treatment, examination of patient, obtaining history from patient or surrogate, ordering and performing treatments and interventions, ordering and review of laboratory studies, ordering and review of radiographic studies, pulse oximetry and re-evaluation of patient's condition   Date: 08/29/2011  Rate: 94  Rhythm: normal sinus rhythm  QRS Axis: normal  Intervals: normal  ST/T Wave abnormalities: normal  Conduction Disutrbances:none  Narrative Interpretation:   Old EKG Reviewed:  unchanged  DIAGNOSTIC STUDIES: Oxygen Saturation is 98% on room air, normal by my interpretation.    COORDINATION OF CARE: 10:48: Physician performs recheck. 12:41: Physician informs patient of intent to admit. Patient agrees with the plan of action set at this time.  Labs Reviewed  BASIC METABOLIC PANEL - Abnormal; Notable for the following:    Potassium 3.2 (*)    Glucose, Bld 101 (*)    All other components within normal limits  CBC  DIFFERENTIAL    1. Anxiety disorder   2. COPD exacerbation   3. Chest wall pain       MDM  Patient with shortness of breath secondary to COPD exacerbation. She has a component of  anxiety as well as she required dose of Ativan as well as morphine. She has some chest wall pain from coughing. A chest x-ray will be performed. I have no concern about pulmonary embolus as she is diffusely wheezy and I feel this is the cause of her shortness of breath. Laboratory studies are relatively unremarkable except for a potassium of 3.2 which was replaced in emergency department. She'll be admitted to the hospitalist service. I personally performed the services described in this documentation, which was scribed in my presence. The recorded information has been reviewed and considered.        Dayton Bailiff, MD 08/29/11 1248  Dayton Bailiff, MD 08/29/11 1249

## 2011-08-29 NOTE — ED Notes (Signed)
Patient left ED via wheelchair with ED tech. Patient in NAD upon departure. Patient medicated for pain prior to departure at patient request and MD order.

## 2011-08-29 NOTE — ED Notes (Signed)
Attempted to call report to [place], Charity fundraiser. RN to call back, unsure if she was to take patient or not. Awaiting call back.

## 2011-08-29 NOTE — Progress Notes (Deleted)
Hospital Admission Note  Date: 08/29/2011  Patient name: [name] Medical record number: [id number]  Date of birth: 06/18/1966 Age: 46 y.o. Gender: female  PCP: FUSCO,LAWRENCE J., MD, MD  Attending physician: Eliodoro Gullett L Sheikh Leverich, MD  Chief Complaint: Shortness of breath and rib pain  History of Present Illness:  [name] is an 46 y.o. female with a history of COPD who presents with several days of worsening shortness of breath, cough, and worsening "rib pain". She's had no fevers or chills. She has been remodeling her house and is concerned that the test has been worsening her breathing. She's also concerned that her daughter's pet rabbit has been affecting her as well. She has had a cough productive of clear sputum. She quit smoking about a year ago. She's had no sick contacts. In the emergency room, she had quite a bit of anxiety and respiratory distress and required benzodiazepines for what the ED physician thought was a panic attack. She still feels short of breath with coughing however.  Past Medical History   Diagnosis  Date   .  Asthma    .  COPD (chronic obstructive pulmonary disease)    .  Hypertension    .  GERD (gastroesophageal reflux disease)    .  Anxiety disorder    .  Chronic pain syndrome    .  Anxiety    .  Depression     Meds:  Medications Prior to Admission    Medications Prior to Admission   Medication  Sig  Dispense  Refill   .  albuterol (PROVENTIL HFA;VENTOLIN HFA) 108 (90 BASE) MCG/ACT inhaler  Inhale 2 puffs into the lungs every 6 (six) hours as needed for wheezing or shortness of breath. For shortness of breath  1 Inhaler  1   .  albuterol (PROVENTIL) 2 MG tablet  Take 2 mg by mouth 4 (four) times daily.     .  amLODipine (NORVASC) 5 MG tablet  Take 5 mg by mouth daily.     .  buPROPion (WELLBUTRIN XL) 300 MG 24 hr tablet  Take 300 mg by mouth every morning.     .  diazepam (VALIUM) 10 MG tablet  Take 10 mg by mouth daily as needed. For  spasms/anxiety     .  fish oil-omega-3 fatty acids 1000 MG capsule  Take 2 g by mouth every morning.     .  fluticasone (FLOVENT HFA) 44 MCG/ACT inhaler  Inhale 2 puffs into the lungs 2 (two) times daily.  1 Inhaler  1   .  guaiFENesin (MUCINEX) 600 MG 12 hr tablet  Take 2 tablets (1,200 mg total) by mouth 2 (two) times daily.  14 tablet  0   .  HYDROcodone-acetaminophen (NORCO) 10-325 MG per tablet  Take 1 tablet by mouth every 6 (six) hours as needed. For pain     .  labetalol (NORMODYNE) 200 MG tablet  Take 200 mg by mouth 2 (two) times daily.     .  lisinopril (PRINIVIL,ZESTRIL) 20 MG tablet  Take 1 tablet (20 mg total) by mouth daily.  30 tablet  0   .  nicotine ([employer] NICOTINE TRANSDERMAL SYS) 14 mg/24hr patch  Place 1 patch onto the skin daily.  28 patch  0   .  pantoprazole (PROTONIX) 40 MG tablet  Take 1 tablet (40 mg total) by mouth 2 (two) times daily before a meal.  30 tablet  1   .  mirtazapine (  REMERON) 15 MG tablet  Take 15 mg by mouth at bedtime as needed. For sleep     Allergies:  Review of patient's allergies indicates no known allergies.  History    Social History   .  Marital Status:  Single     Spouse Name:  [name]     Number of Children:  [name]   .  Years of Education:  [name]    Occupational History   .  Not on file.    Social History Main Topics   .  Smoking status:  Former Smoker     Types:  Cigarettes     Quit date:  10/25/2010   .  Smokeless tobacco:  Never Used   .  Alcohol Use:  1.2 oz/week     2 Cans of beer per week   .  Drug Use:  No   .  Sexually Active:  Yes    Other Topics  Concern   .  Not on file    Social History Narrative   .  No narrative on file    Family History   Problem  Relation  Age of Onset   .  Asthma  Mother    .  Asthma  Father    .  Cancer  Father     Past Surgical History   Procedure  Date   .  Cholecystectomy    .  Cesarean section    .  Carpal tunnel release    .  Esophagogastroduodenoscopy  07/31/2011     Procedure:  ESOPHAGOGASTRODUODENOSCOPY (EGD); Surgeon: Najeeb U Rehman, MD; Location: [facility]; Service: Endoscopy; Laterality: [name];   .  Maloney dilation  07/31/2011     Procedure: MALONEY DILATION; Surgeon: Najeeb U Rehman, MD; Location: [facility]; Service: Endoscopy;;    Review of Systems:  Systems reviewed and as per HPI, otherwise negative.  Physical Exam:  Blood pressure 132/81, pulse 71, temperature 97.8 F (36.6 C), temperature source Oral, resp. rate 20, height 5' 4" (1.626 m), weight 69 kg (152 lb 1.9 oz), SpO2 98.00%.  BP 132/81  Pulse 71  Temp(Src) 97.8 F (36.6 C) (Oral)  Resp 20  Ht 5' 4" (1.626 m)  Wt 69 kg (152 lb 1.9 oz)  BMI 26.11 kg/m2  SpO2 98%  General Appearance:  alert but speech slightly slurred after getting pain medication and benzodiazepine. No respiratory distress. Eating lunch.   Head:  Normocephalic, without obvious abnormality, atraumatic   Eyes:  PERRL, conjunctiva/corneas clear, EOM's intact, fundi  benign, both eyes   Ears:  Normal TM's and external ear canals, both ears   Nose:  Nares normal, septum midline, mucosa normal, no drainage or sinus tenderness   Throat:  Lips, mucosa, and tongue normal; teeth and gums normal   Neck:  Supple, symmetrical, trachea midline, no adenopathy;  thyroid: no enlargement/tenderness/nodules; no carotid  bruit or JVD   Back:  Symmetric, no curvature, ROM normal, no CVA tenderness   Lungs:  bilateral wheeze. Breathing is nonlabored. Able to speak in full sentences. No rhonchi. Good air movement.   Chest Wall:  No tenderness or deformity   Heart:  Regular rate and rhythm, S1 and S2 normal, no murmur, rub or gallop      Abdomen:  Soft, non-tender, bowel sounds active all four quadrants,  no masses, no organomegaly   Genitalia:  deferred   Rectal:  deferred   Extremities:  Extremities normal, atraumatic, no cyanosis or edema     Pulses:  2+ and symmetric all extremities   Skin:  Skin color, texture, turgor normal, no  rashes or lesions   Lymph nodes:  Cervical, supraclavicular, and axillary nodes normal   Neurologic:  CNII-XII intact, normal strength, sensation and reflexes  throughout   Psychiatric: No anxiety currently  Lab results:  Basic Metabolic Panel:   Basename  08/29/11 0833   NA  139   K  3.2*   CL  104   CO2  23   GLUCOSE  101*   BUN  6   CREATININE  0.70   CALCIUM  9.4   MG  1.8   PHOS  --    Liver Function Tests:  No results found for this basename: AST:2,ALT:2,ALKPHOS:2,BILITOT:2,PROT:2,ALBUMIN:2 in the last 72 hours  No results found for this basename: LIPASE:2,AMYLASE:2 in the last 72 hours  No results found for this basename: AMMONIA:2 in the last 72 hours  CBC:   Basename  08/29/11 0833   WBC  9.4   NEUTROABS  5.1   HGB  13.4   HCT  39.2   MCV  95.6   PLT  291    Cardiac Enzymes:  No results found for this basename: CKTOTAL:3,CKMB:3,CKMBINDEX:3,TROPONINI:3 in the last 72 hours  BNP:  No results found for this basename: PROBNP:3 in the last 72 hours  D-Dimer:  No results found for this basename: DDIMER:2 in the last 72 hours  CBG:  No results found for this basename: GLUCAP:6 in the last 72 hours  Hemoglobin A1C:  No results found for this basename: HGBA1C in the last 72 hours  Fasting Lipid Panel:  No results found for this basename: CHOL,HDL,LDLCALC,TRIG,CHOLHDL,LDLDIRECT in the last 72 hours  Thyroid Function Tests:  No results found for this basename: TSH,T4TOTAL,FREET4,T3FREE,THYROIDAB in the last 72 hours  Anemia Panel:  No results found for this basename: VITAMINB12,FOLATE,FERRITIN,TIBC,IRON,RETICCTPCT in the last 72 hours  Coagulation:  No results found for this basename: LABPROT:2,INR:2 in the last 72 hours  Urine Drug Screen:  Drugs of Abuse    Component  Value  Date/Time    LABOPIA  NONE DETECTED  10/25/2008 0344    COCAINSCRNUR  POSITIVE*  10/25/2008 0344    LABBENZ  NONE DETECTED  10/25/2008 0344    AMPHETMU  NONE DETECTED  10/25/2008 0344     THCU  POSITIVE*  10/25/2008 0344    LABBARB  Value: NONE DETECTED DRUG SCREEN FOR MEDICAL PURPOSES ONLY. IF CONFIRMATION IS NEEDED FOR ANY PURPOSE, NOTIFY LAB WITHIN 5 DAYS. LOWEST DETECTABLE LIMITS FOR URINE DRUG SCREEN Drug Class Cutoff (ng/mL) Amphetamine 1000 Barbiturate 200 Benzodiazepine 200 Tricyclics 300 Opiates 300 Cocaine 300 THC 50  10/25/2008 0344    Alcohol Level:  No results found for this basename: ETH:2 in the last 72 hours  Urinalysis:  No results found for this basename: COLORURINE:2,APPERANCEUR:2,LABSPEC:2,PHURINE:2,GLUCOSEU:2,HGBUR:2,BILIRUBINUR:2,KETONESUR:2,PROTEINUR:2,UROBILINOGEN:2,NITRITE:2,LEUKOCYTESUR:2 in the last 72 hours  Imaging results:  Dg Chest 2 View  08/29/2011 *RADIOLOGY REPORT* Clinical Data: Chest pain and shortness of breath. CHEST - 2 VIEW Comparison: 07/28/2011. Findings: The cardiac silhouette, mediastinal and hilar contours are within normal limits and stable. The lungs are clear. No pleural effusions. The bony thorax is intact IMPRESSION: Normal chest x-ray. Original Report Authenticated By: P. MARK GALLERANI, M.D.   Assessment & Plan:  Principal Problem:  *COPD exacerbation  Active Problems:  Hypertension  GERD (gastroesophageal reflux disease)  Anxiety disorder  Chronic pain syndrome  Musculoskeletal chest pain  Hypokalemia   Continue prednisone. Bronchodilators. Will start doxycycline. Supportive

## 2011-08-30 LAB — GLUCOSE, CAPILLARY: Glucose-Capillary: 131 mg/dL — ABNORMAL HIGH (ref 70–99)

## 2011-08-30 MED ORDER — PREDNISONE (PAK) 10 MG PO TABS: ORAL_TABLET | ORAL | Status: DC

## 2011-08-30 MED ORDER — DOXYCYCLINE HYCLATE 100 MG PO TABS: 100.0000 mg | ORAL_TABLET | Freq: Two times a day (BID) | ORAL | Status: DC

## 2011-08-30 MED ORDER — ALBUTEROL SULFATE (5 MG/ML) 0.5% IN NEBU: 2.5000 mg | INHALATION_SOLUTION | Freq: Four times a day (QID) | RESPIRATORY_TRACT | Status: DC | PRN

## 2011-08-30 MED ORDER — DICLOFENAC POTASSIUM 50 MG PO TABS: 50.0000 mg | ORAL_TABLET | Freq: Two times a day (BID) | ORAL | Status: DC

## 2011-08-30 MED ORDER — BENZONATATE 200 MG PO CAPS: 200.0000 mg | ORAL_CAPSULE | Freq: Three times a day (TID) | ORAL | Status: AC | PRN

## 2011-08-30 NOTE — Discharge Summary (Signed)
Physician Discharge Summary  Patient ID: [name] MRN: [id number] DOB/AGE: Mar 02, 1966 46 y.o.  Admit date: 08/29/2011 Discharge date: 08/30/2011  Discharge Diagnoses:  Principal Problem:  *COPD exacerbation Active Problems:  Hypertension  GERD (gastroesophageal reflux disease)  Anxiety disorder  Chronic pain syndrome  Musculoskeletal chest pain  Hypokalemia   Medication List  As of 08/30/2011  3:09 PM           albuterol (5 MG/ML) 0.5% nebulizer solution         TAKE these medications         albuterol 2 MG tablet   Commonly known as: PROVENTIL   Take 2 mg by mouth 4 (four) times daily.      albuterol (2.5 MG/3ML) 0.083% nebulizer solution   Commonly known as: PROVENTIL   Take 2.5 mg by nebulization every 6 (six) hours as needed. For shortness of breath      albuterol 108 (90 BASE) MCG/ACT inhaler   Commonly known as: PROVENTIL HFA;VENTOLIN HFA   Inhale 2 puffs into the lungs every 6 (six) hours as needed for wheezing or shortness of breath. For shortness of breath      amLODipine 5 MG tablet   Commonly known as: NORVASC   Take 5 mg by mouth daily.      benzonatate 200 MG capsule   Commonly known as: TESSALON   Take 1 capsule (200 mg total) by mouth 3 (three) times daily as needed for cough.      buPROPion 300 MG 24 hr tablet   Commonly known as: WELLBUTRIN XL   Take 300 mg by mouth every morning.      diazepam 10 MG tablet   Commonly known as: VALIUM   Take 10 mg by mouth daily as needed. For spasms/anxiety      diclofenac 50 MG tablet   Commonly known as: CATAFLAM   Take 1 tablet (50 mg total) by mouth 2 (two) times daily.      doxycycline 100 MG tablet   Commonly known as: VIBRA-TABS   Take 1 tablet (100 mg total) by mouth every 12 (twelve) hours.      fish oil-omega-3 fatty acids 1000 MG capsule   Take 2 g by mouth every morning.      fluticasone 44 MCG/ACT inhaler   Commonly known as: FLOVENT HFA   Inhale 2 puffs into the lungs 2 (two)  times daily.      guaiFENesin 600 MG 12 hr tablet   Commonly known as: MUCINEX   Take 2 tablets (1,200 mg total) by mouth 2 (two) times daily.      HYDROcodone-acetaminophen 10-325 MG per tablet   Commonly known as: NORCO   Take 1 tablet by mouth every 6 (six) hours as needed. For pain      labetalol 200 MG tablet   Commonly known as: NORMODYNE   Take 200 mg by mouth 2 (two) times daily.      lisinopril 20 MG tablet   Commonly known as: PRINIVIL,ZESTRIL   Take 1 tablet (20 mg total) by mouth daily.      mirtazapine 15 MG tablet   Commonly known as: REMERON   Take 15 mg by mouth at bedtime as needed. For sleep      nicotine 14 mg/24hr patch   Commonly known as: NICODERM CQ - dosed in mg/24 hours   Place 1 patch onto the skin daily.      pantoprazole 40 MG tablet   Commonly known  as: PROTONIX   Take 1 tablet (40 mg total) by mouth 2 (two) times daily before a meal.      predniSONE 10 MG tablet   Commonly known as: STERAPRED UNI-PAK   Take 40mg  po daily for 2 days then 30mg  po daily for 2 days then 20mg  po daily for 2 days then 10mg  po daily for 2 days then stop      TUDORZA PRESSAIR 400 MCG/ACT Aepb   Generic drug: Aclidinium Bromide   Inhale 1 puff into the lungs as needed. For shortness of breath            Discharge Orders    Future Orders Please Complete By Expires   Diet - low sodium heart healthy      Increase activity slowly      Discharge instructions      Comments:   May take over the counter non-sedating antihistamines as needed for allergies      Follow-up Information    Follow up with Cassell Smiles., MD .         Disposition: Home or Self Care  Discharged Condition:   Consults:    Labs:   Results for orders placed during the hospital encounter of 08/29/11 (from the past 48 hour(s))  CBC     Status: Normal   Collection Time   08/29/11  8:33 AM      Component Value Range Comment   WBC 9.4  4.0 - 10.5 (K/uL)    RBC 4.10  3.87 - 5.11  (MIL/uL)    Hemoglobin 13.4  12.0 - 15.0 (g/dL)    HCT 45.4  09.8 - 11.9 (%)    MCV 95.6  78.0 - 100.0 (fL)    MCH 32.7  26.0 - 34.0 (pg)    MCHC 34.2  30.0 - 36.0 (g/dL)    RDW 14.7  82.9 - 56.2 (%)    Platelets 291  150 - 400 (K/uL)   DIFFERENTIAL     Status: Normal   Collection Time   08/29/11  8:33 AM      Component Value Range Comment   Neutrophils Relative 54  43 - 77 (%)    Neutro Abs 5.1  1.7 - 7.7 (K/uL)    Lymphocytes Relative 34  12 - 46 (%)    Lymphs Abs 3.2  0.7 - 4.0 (K/uL)    Monocytes Relative 7  3 - 12 (%)    Monocytes Absolute 0.6  0.1 - 1.0 (K/uL)    Eosinophils Relative 4  0 - 5 (%)    Eosinophils Absolute 0.4  0.0 - 0.7 (K/uL)    Basophils Relative 1  0 - 1 (%)    Basophils Absolute 0.1  0.0 - 0.1 (K/uL)   BASIC METABOLIC PANEL     Status: Abnormal   Collection Time   08/29/11  8:33 AM      Component Value Range Comment   Sodium 139  135 - 145 (mEq/L)    Potassium 3.2 (*) 3.5 - 5.1 (mEq/L)    Chloride 104  96 - 112 (mEq/L)    CO2 23  19 - 32 (mEq/L)    Glucose, Bld 101 (*) 70 - 99 (mg/dL)    BUN 6  6 - 23 (mg/dL)    Creatinine, Ser 1.30  0.50 - 1.10 (mg/dL)    Calcium 9.4  8.4 - 10.5 (mg/dL)    GFR calc non Af Amer >90  >90 (mL/min)    GFR calc  Af Amer >90  >90 (mL/min)   MAGNESIUM     Status: Normal   Collection Time   08/29/11  8:33 AM      Component Value Range Comment   Magnesium 1.8  1.5 - 2.5 (mg/dL)   GLUCOSE, CAPILLARY     Status: Abnormal   Collection Time   08/30/11  7:32 AM      Component Value Range Comment   Glucose-Capillary 131 (*) 70 - 99 (mg/dL)     Diagnostics:  Dg Chest 2 View  08/29/2011  *RADIOLOGY REPORT*  Clinical Data: Chest pain and shortness of breath.  CHEST - 2 VIEW  Comparison: 07/28/2011.  Findings: The cardiac silhouette, mediastinal and hilar contours are within normal limits and stable. The lungs are clear.  No pleural effusions.  The bony thorax is intact  IMPRESSION: Normal chest x-ray.  Original Report Authenticated  By: P. Loralie Champagne, M.D.    Full Code   Hospital Course:  See H&P for complete admission details. The patient is a 46 year old white female with a history of COPD who presented with several day history of worsening cough, shortness of breath, chest congestion and pleuritic rib cage pain from coughing. In the emergency room she reportedly was severely anxious in the ED physician was concerned about panic attack as well. On admission, she was wheezing. She was able to speak in full sentences. She was admitted and started on doxycycline, prednisone, bronchodilators and nonsteroidal anti-inflammatories. She also received benzodiazepines as needed. She reports that the Toradol helps most with her pain. By the time of discharge, she still had some chest congestion and cough but was feeling better and able to ambulate. She is concerned about allergies from a new pet rabbit. She may try an over-the-counter antihistamine but if continues to have problems, may need to give the rabbit away. She has a previous history of smoking but quit almost a year ago. Total time on the day of discharge is greater than 30 minutes.  Discharge Exam:  Blood pressure 125/87, pulse 82, temperature 97.4 F (36.3 C), temperature source Oral, resp. rate 18, height 5\' 4"  (1.626 m), weight 69 kg (152 lb 1.9 oz), SpO2 94.00%.  General: More comfortable. Coughing less. Not anxious. Lungs: Congestion with slight wheeze. Good air movement. Nonlabored breathing. Cardiovascular regular rate rhythm without murmurs gallops rubs Extremities no clubbing cyanosis or edema   Signed: Liese Dizdarevic L 08/30/2011, 3:09 PM

## 2011-08-30 NOTE — Progress Notes (Signed)
PIV removed without complaint, patient discharged home. Patient verbalizes understanding of discharge instructions, follow up care and prescriptions. Patient escorted out by staff, transported by family. 

## 2011-09-03 NOTE — H&P (Addendum)
Hospital Admission Note  Date: 08/29/2011  Patient name: [name] Medical record number: [id number]  Date of birth: 1965-10-01 Age: 46 y.o. Gender: female  PCP: Cassell Smiles., MD, MD  Attending physician: Christiane Ha, MD  Chief Complaint: Shortness of breath and rib pain  History of Present Illness:  [name] is an 46 y.o. female with a history of COPD who presents with several days of worsening shortness of breath, cough, and worsening "rib pain". She's had no fevers or chills. She has been remodeling her house and is concerned that the test has been worsening her breathing. She's also concerned that her daughter's pet rabbit has been affecting her as well. She has had a cough productive of clear sputum. She quit smoking about a year ago. She's had no sick contacts. In the emergency room, she had quite a bit of anxiety and respiratory distress and required benzodiazepines for what the ED physician thought was a panic attack. She still feels short of breath with coughing however.  Past Medical History   Diagnosis  Date   .  Asthma    .  COPD (chronic obstructive pulmonary disease)    .  Hypertension    .  GERD (gastroesophageal reflux disease)    .  Anxiety disorder    .  Chronic pain syndrome    .  Anxiety    .  Depression     Meds:  Medications Prior to Admission    Medications Prior to Admission   Medication  Sig  Dispense  Refill   .  albuterol (PROVENTIL HFA;VENTOLIN HFA) 108 (90 BASE) MCG/ACT inhaler  Inhale 2 puffs into the lungs every 6 (six) hours as needed for wheezing or shortness of breath. For shortness of breath  1 Inhaler  1   .  albuterol (PROVENTIL) 2 MG tablet  Take 2 mg by mouth 4 (four) times daily.     Marland Kitchen  amLODipine (NORVASC) 5 MG tablet  Take 5 mg by mouth daily.     Marland Kitchen  buPROPion (WELLBUTRIN XL) 300 MG 24 hr tablet  Take 300 mg by mouth every morning.     .  diazepam (VALIUM) 10 MG tablet  Take 10 mg by mouth daily as needed. For  spasms/anxiety     .  fish oil-omega-3 fatty acids 1000 MG capsule  Take 2 g by mouth every morning.     .  fluticasone (FLOVENT HFA) 44 MCG/ACT inhaler  Inhale 2 puffs into the lungs 2 (two) times daily.  1 Inhaler  1   .  guaiFENesin (MUCINEX) 600 MG 12 hr tablet  Take 2 tablets (1,200 mg total) by mouth 2 (two) times daily.  14 tablet  0   .  HYDROcodone-acetaminophen (NORCO) 10-325 MG per tablet  Take 1 tablet by mouth every 6 (six) hours as needed. For pain     .  labetalol (NORMODYNE) 200 MG tablet  Take 200 mg by mouth 2 (two) times daily.     Marland Kitchen  lisinopril (PRINIVIL,ZESTRIL) 20 MG tablet  Take 1 tablet (20 mg total) by mouth daily.  30 tablet  0   .  nicotine ([employer] NICOTINE TRANSDERMAL SYS) 14 mg/24hr patch  Place 1 patch onto the skin daily.  28 patch  0   .  pantoprazole (PROTONIX) 40 MG tablet  Take 1 tablet (40 mg total) by mouth 2 (two) times daily before a meal.  30 tablet  1   .  mirtazapine (  REMERON) 15 MG tablet  Take 15 mg by mouth at bedtime as needed. For sleep     Allergies:  Review of patient's allergies indicates no known allergies.  History    Social History   .  Marital Status:  Single     Spouse Name:  N/A     Number of Children:  N/A   .  Years of Education:  N/A    Occupational History   .  Not on file.    Social History Main Topics   .  Smoking status:  Former Smoker     Types:  Cigarettes     Quit date:  10/25/2010   .  Smokeless tobacco:  Never Used   .  Alcohol Use:  1.2 oz/week     2 Cans of beer per week   .  Drug Use:  No   .  Sexually Active:  Yes    Other Topics  Concern   .  Not on file    Social History Narrative   .  No narrative on file    Family History   Problem  Relation  Age of Onset   .  Asthma  Mother    .  Asthma  Father    .  Cancer  Father     Past Surgical History   Procedure  Date   .  Cholecystectomy    .  Cesarean section    .  Carpal tunnel release    .  Esophagogastroduodenoscopy  07/31/2011     Procedure:  ESOPHAGOGASTRODUODENOSCOPY (EGD); Surgeon: Malissa Hippo, MD; Location: [facility]; Service: Endoscopy; Laterality: N/A;   Elease Hashimoto dilation  07/31/2011     Procedure: Elease Hashimoto DILATION; Surgeon: Malissa Hippo, MD; Location: [facility]; Service: Endoscopy;;    Review of Systems:  Systems reviewed and as per HPI, otherwise negative.  Physical Exam:  Blood pressure 132/81, pulse 71, temperature 97.8 F (36.6 C), temperature source Oral, resp. rate 20, height 5\' 4"  (1.626 m), weight 69 kg (152 lb 1.9 oz), SpO2 98.00%.  BP 132/81  Pulse 71  Temp(Src) 97.8 F (36.6 C) (Oral)  Resp 20  Ht 5\' 4"  (1.626 m)  Wt 69 kg (152 lb 1.9 oz)  BMI 26.11 kg/m2  SpO2 98%  General Appearance:  alert but speech slightly slurred after getting pain medication and benzodiazepine. No respiratory distress. Eating lunch.   Head:  Normocephalic, without obvious abnormality, atraumatic   Eyes:  PERRL, conjunctiva/corneas clear, EOM's intact, fundi  benign, both eyes   Ears:  Normal TM's and external ear canals, both ears   Nose:  Nares normal, septum midline, mucosa normal, no drainage or sinus tenderness   Throat:  Lips, mucosa, and tongue normal; teeth and gums normal   Neck:  Supple, symmetrical, trachea midline, no adenopathy;  thyroid: no enlargement/tenderness/nodules; no carotid  bruit or JVD   Back:  Symmetric, no curvature, ROM normal, no CVA tenderness   Lungs:  bilateral wheeze. Breathing is nonlabored. Able to speak in full sentences. No rhonchi. Good air movement.   Chest Wall:  No tenderness or deformity   Heart:  Regular rate and rhythm, S1 and S2 normal, no murmur, rub or gallop      Abdomen:  Soft, non-tender, bowel sounds active all four quadrants,  no masses, no organomegaly   Genitalia:  deferred   Rectal:  deferred   Extremities:  Extremities normal, atraumatic, no cyanosis or edema  Pulses:  2+ and symmetric all extremities   Skin:  Skin color, texture, turgor normal, no  rashes or lesions   Lymph nodes:  Cervical, supraclavicular, and axillary nodes normal   Neurologic:  CNII-XII intact, normal strength, sensation and reflexes  throughout   Psychiatric: No anxiety currently  Lab results:  Basic Metabolic Panel:   Basename  08/29/11 0833   NA  139   K  3.2*   CL  104   CO2  23   GLUCOSE  101*   BUN  6   CREATININE  0.70   CALCIUM  9.4   MG  1.8   PHOS  --    Liver Function Tests:  No results found for this basename: AST:2,ALT:2,ALKPHOS:2,BILITOT:2,PROT:2,ALBUMIN:2 in the last 72 hours  No results found for this basename: LIPASE:2,AMYLASE:2 in the last 72 hours  No results found for this basename: AMMONIA:2 in the last 72 hours  CBC:   Basename  08/29/11 0833   WBC  9.4   NEUTROABS  5.1   HGB  13.4   HCT  39.2   MCV  95.6   PLT  291    Cardiac Enzymes:  No results found for this basename: CKTOTAL:3,CKMB:3,CKMBINDEX:3,TROPONINI:3 in the last 72 hours  BNP:  No results found for this basename: PROBNP:3 in the last 72 hours  D-Dimer:  No results found for this basename: DDIMER:2 in the last 72 hours  CBG:  No results found for this basename: GLUCAP:6 in the last 72 hours  Hemoglobin A1C:  No results found for this basename: HGBA1C in the last 72 hours  Fasting Lipid Panel:  No results found for this basename: CHOL,HDL,LDLCALC,TRIG,CHOLHDL,LDLDIRECT in the last 72 hours  Thyroid Function Tests:  No results found for this basename: TSH,T4TOTAL,FREET4,T3FREE,THYROIDAB in the last 72 hours  Anemia Panel:  No results found for this basename: VITAMINB12,FOLATE,FERRITIN,TIBC,IRON,RETICCTPCT in the last 72 hours  Coagulation:  No results found for this basename: LABPROT:2,INR:2 in the last 72 hours  Urine Drug Screen:  Drugs of Abuse    Component  Value  Date/Time    LABOPIA  NONE DETECTED  10/25/2008 0344    COCAINSCRNUR  POSITIVE*  10/25/2008 0344    LABBENZ  NONE DETECTED  10/25/2008 0344    AMPHETMU  NONE DETECTED  10/25/2008 0344     THCU  POSITIVE*  10/25/2008 0344    LABBARB  Value: NONE DETECTED DRUG SCREEN FOR MEDICAL PURPOSES ONLY. IF CONFIRMATION IS NEEDED FOR ANY PURPOSE, NOTIFY LAB WITHIN 5 DAYS. LOWEST DETECTABLE LIMITS FOR URINE DRUG SCREEN Drug Class Cutoff (ng/mL) Amphetamine 1000 Barbiturate 200 Benzodiazepine 200 Tricyclics 300 Opiates 300 Cocaine 300 THC 50  10/25/2008 0344    Alcohol Level:  No results found for this basename: ETH:2 in the last 72 hours  Urinalysis:  No results found for this basename: COLORURINE:2,APPERANCEUR:2,LABSPEC:2,PHURINE:2,GLUCOSEU:2,HGBUR:2,BILIRUBINUR:2,KETONESUR:2,PROTEINUR:2,UROBILINOGEN:2,NITRITE:2,LEUKOCYTESUR:2 in the last 72 hours  Imaging results:  Dg Chest 2 View  08/29/2011 *RADIOLOGY REPORT* Clinical Data: Chest pain and shortness of breath. CHEST - 2 VIEW Comparison: 07/28/2011. Findings: The cardiac silhouette, mediastinal and hilar contours are within normal limits and stable. The lungs are clear. No pleural effusions. The bony thorax is intact IMPRESSION: Normal chest x-ray. Original Report Authenticated By: P. Loralie Champagne, M.D.   Assessment & Plan:  Principal Problem:  *COPD exacerbation  Active Problems:  Hypertension  GERD (gastroesophageal reflux disease)  Anxiety disorder  Chronic pain syndrome  Musculoskeletal chest pain  Hypokalemia   Continue prednisone. Bronchodilators. Will start doxycycline. Supportive  care. Cough suppressants. Pain medication. Benzodiazepines as needed. Replete potassium.  Devyon Keator L  08/29/2011, 6:09 PM

## 2011-09-10 ENCOUNTER — Inpatient Hospital Stay (HOSPITAL_COMMUNITY)
Admission: EM | Admit: 2011-09-10 | Discharge: 2011-09-13 | DRG: 192 | Disposition: A | Discharge status: Home / Self Care | Payer: Medicaid Other | Attending: Internal Medicine | Admitting: Internal Medicine

## 2011-09-10 ENCOUNTER — Emergency Department (HOSPITAL_COMMUNITY): Payer: Medicaid Other

## 2011-09-10 ENCOUNTER — Encounter (HOSPITAL_COMMUNITY): Payer: Self-pay | Admitting: *Deleted

## 2011-09-10 DIAGNOSIS — J45901 Unspecified asthma with (acute) exacerbation: Secondary | ICD-10-CM

## 2011-09-10 DIAGNOSIS — F419 Anxiety disorder, unspecified: Secondary | ICD-10-CM | POA: Diagnosis present

## 2011-09-10 DIAGNOSIS — G894 Chronic pain syndrome: Secondary | ICD-10-CM | POA: Diagnosis present

## 2011-09-10 DIAGNOSIS — F329 Major depressive disorder, single episode, unspecified: Secondary | ICD-10-CM | POA: Diagnosis present

## 2011-09-10 DIAGNOSIS — J441 Chronic obstructive pulmonary disease with (acute) exacerbation: Principal | ICD-10-CM | POA: Diagnosis present

## 2011-09-10 DIAGNOSIS — E876 Hypokalemia: Secondary | ICD-10-CM

## 2011-09-10 DIAGNOSIS — I10 Essential (primary) hypertension: Secondary | ICD-10-CM | POA: Diagnosis present

## 2011-09-10 DIAGNOSIS — Z79899 Other long term (current) drug therapy: Secondary | ICD-10-CM

## 2011-09-10 DIAGNOSIS — F3289 Other specified depressive episodes: Secondary | ICD-10-CM | POA: Diagnosis present

## 2011-09-10 DIAGNOSIS — R0789 Other chest pain: Secondary | ICD-10-CM

## 2011-09-10 DIAGNOSIS — J449 Chronic obstructive pulmonary disease, unspecified: Secondary | ICD-10-CM

## 2011-09-10 DIAGNOSIS — K219 Gastro-esophageal reflux disease without esophagitis: Secondary | ICD-10-CM | POA: Diagnosis present

## 2011-09-10 DIAGNOSIS — F411 Generalized anxiety disorder: Secondary | ICD-10-CM | POA: Diagnosis present

## 2011-09-10 LAB — CBC
HCT: 40 % (ref 36.0–46.0)
MCH: 32.6 pg (ref 26.0–34.0)
MCHC: 33.5 g/dL (ref 30.0–36.0)
MCV: 97.3 fL (ref 78.0–100.0)
Platelets: 257 10*3/uL (ref 150–400)
RDW: 13.6 % (ref 11.5–15.5)
WBC: 8.7 10*3/uL (ref 4.0–10.5)

## 2011-09-10 LAB — DIFFERENTIAL
Basophils Absolute: 0.1 10*3/uL (ref 0.0–0.1)
Basophils Relative: 1 % (ref 0–1)
Eosinophils Absolute: 0.7 10*3/uL (ref 0.0–0.7)
Eosinophils Relative: 8 % — ABNORMAL HIGH (ref 0–5)
Monocytes Absolute: 0.5 10*3/uL (ref 0.1–1.0)

## 2011-09-10 LAB — BASIC METABOLIC PANEL
CO2: 23 mEq/L (ref 19–32)
Calcium: 9.5 mg/dL (ref 8.4–10.5)
Creatinine, Ser: 0.66 mg/dL (ref 0.50–1.10)
GFR calc non Af Amer: >90 mL/min (ref >90)
Sodium: 135 mEq/L (ref 135–145)

## 2011-09-10 LAB — PRO B NATRIURETIC PEPTIDE: Pro B Natriuretic peptide (BNP): 63.6 pg/mL (ref 0–125)

## 2011-09-10 MED ORDER — FLUTICASONE PROPIONATE HFA 44 MCG/ACT IN AERO
2.0000 | INHALATION_SPRAY | Freq: Two times a day (BID) | RESPIRATORY_TRACT | Status: DC
  Administered 2011-09-10 – 2011-09-13 (×6): 2 via RESPIRATORY_TRACT
  Filled 2011-09-10: qty 10.6

## 2011-09-10 MED ORDER — SODIUM CHLORIDE 0.9 % IJ SOLN
INTRAMUSCULAR | Status: AC
  Administered 2011-09-10: 3 mL
  Filled 2011-09-10: qty 3

## 2011-09-10 MED ORDER — IBUPROFEN 800 MG PO TABS
800.0000 mg | ORAL_TABLET | Freq: Once | ORAL | Status: AC
  Administered 2011-09-10: 800 mg via ORAL
  Filled 2011-09-10: qty 1

## 2011-09-10 MED ORDER — ONDANSETRON HCL 4 MG/2ML IJ SOLN: 4.0000 mg | Freq: Four times a day (QID) | INTRAMUSCULAR | Status: DC | PRN

## 2011-09-10 MED ORDER — METHYLPREDNISOLONE SODIUM SUCC 125 MG IJ SOLR
80.0000 mg | Freq: Two times a day (BID) | INTRAMUSCULAR | Status: DC
  Administered 2011-09-10 – 2011-09-12 (×4): 80 mg via INTRAVENOUS
  Filled 2011-09-10 (×4): qty 2

## 2011-09-10 MED ORDER — GUAIFENESIN ER 600 MG PO TB12: 600.0000 mg | ORAL_TABLET | Freq: Two times a day (BID) | ORAL | Status: DC

## 2011-09-10 MED ORDER — IPRATROPIUM BROMIDE 0.02 % IN SOLN
0.5000 mg | Freq: Four times a day (QID) | RESPIRATORY_TRACT | Status: DC
  Administered 2011-09-10 – 2011-09-13 (×9): 0.5 mg via RESPIRATORY_TRACT
  Filled 2011-09-10 (×11): qty 2.5

## 2011-09-10 MED ORDER — LABETALOL HCL 200 MG PO TABS
200.0000 mg | ORAL_TABLET | Freq: Two times a day (BID) | ORAL | Status: DC
  Administered 2011-09-10 – 2011-09-13 (×6): 200 mg via ORAL
  Filled 2011-09-10 (×6): qty 1

## 2011-09-10 MED ORDER — BUPROPION HCL ER (XL) 300 MG PO TB24
300.0000 mg | ORAL_TABLET | ORAL | Status: DC
  Administered 2011-09-11 – 2011-09-13 (×3): 300 mg via ORAL
  Filled 2011-09-10 (×3): qty 1

## 2011-09-10 MED ORDER — ALBUTEROL SULFATE (5 MG/ML) 0.5% IN NEBU: 2.5000 mg | INHALATION_SOLUTION | RESPIRATORY_TRACT | Status: DC | PRN

## 2011-09-10 MED ORDER — GUAIFENESIN ER 600 MG PO TB12
1200.0000 mg | ORAL_TABLET | Freq: Two times a day (BID) | ORAL | Status: DC
  Administered 2011-09-10 – 2011-09-13 (×6): 1200 mg via ORAL
  Filled 2011-09-10 (×6): qty 2

## 2011-09-10 MED ORDER — LISINOPRIL 10 MG PO TABS
20.0000 mg | ORAL_TABLET | Freq: Every day | ORAL | Status: DC
  Administered 2011-09-11 – 2011-09-13 (×3): 20 mg via ORAL
  Filled 2011-09-10 (×4): qty 2

## 2011-09-10 MED ORDER — AMLODIPINE BESYLATE 5 MG PO TABS
5.0000 mg | ORAL_TABLET | Freq: Every day | ORAL | Status: DC
  Administered 2011-09-11 – 2011-09-13 (×3): 5 mg via ORAL
  Filled 2011-09-10 (×4): qty 1

## 2011-09-10 MED ORDER — ALBUTEROL SULFATE (5 MG/ML) 0.5% IN NEBU
2.5000 mg | INHALATION_SOLUTION | Freq: Once | RESPIRATORY_TRACT | Status: AC
  Administered 2011-09-10: 2.5 mg via RESPIRATORY_TRACT

## 2011-09-10 MED ORDER — ALBUTEROL (5 MG/ML) CONTINUOUS INHALATION SOLN
INHALATION_SOLUTION | RESPIRATORY_TRACT | Status: AC
  Administered 2011-09-10: 13:00:00
  Filled 2011-09-10: qty 20

## 2011-09-10 MED ORDER — GUAIFENESIN-DM 100-10 MG/5ML PO SYRP
5.0000 mL | ORAL_SOLUTION | ORAL | Status: DC | PRN
  Administered 2011-09-11 – 2011-09-12 (×2): 5 mL via ORAL
  Filled 2011-09-10 (×2): qty 5

## 2011-09-10 MED ORDER — PANTOPRAZOLE SODIUM 40 MG PO TBEC
40.0000 mg | DELAYED_RELEASE_TABLET | Freq: Two times a day (BID) | ORAL | Status: DC
  Administered 2011-09-11 – 2011-09-13 (×5): 40 mg via ORAL
  Filled 2011-09-10 (×5): qty 1

## 2011-09-10 MED ORDER — HYDROCODONE-ACETAMINOPHEN 10-325 MG PO TABS
1.0000 | ORAL_TABLET | Freq: Once | ORAL | Status: AC
  Administered 2011-09-10: 1 via ORAL

## 2011-09-10 MED ORDER — SODIUM CHLORIDE 0.9 % IJ SOLN
INTRAMUSCULAR | Status: AC
  Administered 2011-09-10: 20:00:00
  Filled 2011-09-10: qty 3

## 2011-09-10 MED ORDER — METHYLPREDNISOLONE SODIUM SUCC 125 MG IJ SOLR
125.0000 mg | Freq: Once | INTRAMUSCULAR | Status: AC
  Administered 2011-09-10: 125 mg via INTRAVENOUS

## 2011-09-10 MED ORDER — LORAZEPAM 2 MG/ML IJ SOLN
1.0000 mg | Freq: Once | INTRAMUSCULAR | Status: AC
  Administered 2011-09-10: 1 mg via INTRAVENOUS
  Filled 2011-09-10: qty 1

## 2011-09-10 MED ORDER — ALBUTEROL SULFATE (5 MG/ML) 0.5% IN NEBU
INHALATION_SOLUTION | RESPIRATORY_TRACT | Status: AC
  Administered 2011-09-10: 2.5 mg
  Filled 2011-09-10: qty 0.5

## 2011-09-10 MED ORDER — DOXYCYCLINE HYCLATE 100 MG PO TABS
100.0000 mg | ORAL_TABLET | Freq: Two times a day (BID) | ORAL | Status: DC
  Administered 2011-09-10 – 2011-09-13 (×6): 100 mg via ORAL
  Filled 2011-09-10 (×6): qty 1

## 2011-09-10 MED ORDER — METHYLPREDNISOLONE SODIUM SUCC 125 MG IJ SOLR
INTRAMUSCULAR | Status: AC
  Administered 2011-09-10: 125 mg via INTRAVENOUS
  Filled 2011-09-10: qty 2

## 2011-09-10 MED ORDER — HYDROCODONE-ACETAMINOPHEN 10-325 MG PO TABS
1.0000 | ORAL_TABLET | Freq: Four times a day (QID) | ORAL | Status: DC | PRN
  Administered 2011-09-10 – 2011-09-13 (×10): 1 via ORAL
  Filled 2011-09-10 (×11): qty 1

## 2011-09-10 MED ORDER — ALBUTEROL SULFATE (5 MG/ML) 0.5% IN NEBU
2.5000 mg | INHALATION_SOLUTION | Freq: Four times a day (QID) | RESPIRATORY_TRACT | Status: DC
  Administered 2011-09-10 – 2011-09-13 (×9): 2.5 mg via RESPIRATORY_TRACT
  Filled 2011-09-10 (×11): qty 0.5

## 2011-09-10 MED ORDER — KETOROLAC TROMETHAMINE 30 MG/ML IJ SOLN
30.0000 mg | Freq: Four times a day (QID) | INTRAMUSCULAR | Status: DC | PRN
  Administered 2011-09-10 – 2011-09-11 (×4): 30 mg via INTRAVENOUS
  Filled 2011-09-10 (×4): qty 1

## 2011-09-10 MED ORDER — ACETAMINOPHEN 325 MG PO TABS: 650.0000 mg | ORAL_TABLET | Freq: Four times a day (QID) | ORAL | Status: DC | PRN

## 2011-09-10 MED ORDER — ONDANSETRON HCL 4 MG PO TABS: 4.0000 mg | ORAL_TABLET | Freq: Four times a day (QID) | ORAL | Status: DC | PRN

## 2011-09-10 MED ORDER — OXYCODONE-ACETAMINOPHEN 5-325 MG PO TABS
1.0000 | ORAL_TABLET | Freq: Once | ORAL | Status: AC
  Administered 2011-09-10: 1 via ORAL
  Filled 2011-09-10: qty 1

## 2011-09-10 MED ORDER — ENOXAPARIN SODIUM 40 MG/0.4ML ~~LOC~~ SOLN
40.0000 mg | SUBCUTANEOUS | Status: DC
  Administered 2011-09-10 – 2011-09-12 (×3): 40 mg via SUBCUTANEOUS
  Filled 2011-09-10 (×3): qty 0.4

## 2011-09-10 MED ORDER — IPRATROPIUM BROMIDE 0.02 % IN SOLN
RESPIRATORY_TRACT | Status: AC
  Administered 2011-09-10: 0.5 mg
  Filled 2011-09-10: qty 2.5

## 2011-09-10 MED ORDER — ACETAMINOPHEN 650 MG RE SUPP: 650.0000 mg | Freq: Four times a day (QID) | RECTAL | Status: DC | PRN

## 2011-09-10 MED ORDER — DIAZEPAM 5 MG PO TABS
10.0000 mg | ORAL_TABLET | Freq: Two times a day (BID) | ORAL | Status: DC | PRN
  Administered 2011-09-10 – 2011-09-11 (×2): 10 mg via ORAL
  Filled 2011-09-10 (×3): qty 2

## 2011-09-10 MED ORDER — SODIUM CHLORIDE 0.9 % IV SOLN: INTRAVENOUS | Status: AC

## 2011-09-10 NOTE — H&P (Signed)
PCP:   Cassell Smiles., MD, MD   Chief Complaint:  wheezing  HPI: This is a 46 year old female with history of COPD who was recently discharged from the hospital on February 15 after being treated for COPD exacerbation. Patient reports that she been doing fairly well when she was discharged. She reports that over the past 2-3 days she has been having increasingly difficulty with wheezing and shortness of breath. She was using her nebulizer machine at home without much benefit. She denies any fever, has not had a change in her cough. She was having difficulty breathing while laying down. She reports bilateral rib pain while coughing. Denies any nausea vomiting, no diarrhea, no dysuria, no abdominal pain. She was evaluated in the emergency room and received nebulizer treatments with mild improvement of her symptoms. It was felt she was safe to discharge home at this point. She reports that she has finished her prednisone was prescribed to her previous discharge. While she was on prednisone she felt that she was doing better, since she's been off the prednisone she does describe a decline.  Allergies:  No Known Allergies    Past Medical History  Diagnosis Date  . Asthma   . COPD (chronic obstructive pulmonary disease)   . Hypertension   . GERD (gastroesophageal reflux disease)   . Anxiety disorder   . Chronic pain syndrome   . Anxiety   . Depression     Past Surgical History  Procedure Date  . Cholecystectomy   . Cesarean section   . Carpal tunnel release   . Esophagogastroduodenoscopy 07/31/2011    Procedure: ESOPHAGOGASTRODUODENOSCOPY (EGD);  Surgeon: Malissa Hippo, MD;  [facility]: [facility];  Service: Endoscopy;  Laterality: N/A;  Elease Hashimoto dilation 07/31/2011    Procedure: Elease Hashimoto DILATION;  Surgeon: Malissa Hippo, MD;  Location: [facility];  Service: Endoscopy;;    Prior to Admission medications   Medication Sig Start Date End Date Taking? Authorizing Provider    Aclidinium Bromide (TUDORZA PRESSAIR) 400 MCG/ACT AEPB Inhale 1 puff into the lungs as needed. For shortness of breath   Yes Historical Provider, MD  albuterol (PROVENTIL HFA;VENTOLIN HFA) 108 (90 BASE) MCG/ACT inhaler Inhale 2 puffs into the lungs every 6 (six) hours as needed for wheezing or shortness of breath. For shortness of breath 07/31/11  Yes Erick Blinks, MD  albuterol (PROVENTIL) (2.5 MG/3ML) 0.083% nebulizer solution Take 2.5 mg by nebulization every 6 (six) hours as needed. For shortness of breath   Yes Historical Provider, MD  albuterol (PROVENTIL) 2 MG tablet Take 2 mg by mouth 4 (four) times daily.   Yes Historical Provider, MD  amLODipine (NORVASC) 5 MG tablet Take 5 mg by mouth daily.     Yes Historical Provider, MD  buPROPion (WELLBUTRIN XL) 300 MG 24 hr tablet Take 300 mg by mouth every morning.     Yes Historical Provider, MD  diazepam (VALIUM) 10 MG tablet Take 10 mg by mouth daily as needed. For spasms/anxiety    Yes Historical Provider, MD  doxycycline (VIBRA-TABS) 100 MG tablet Take 100 mg by mouth every 12 (twelve) hours. 08/30/11 10/10/11 Yes Christiane Ha, MD  fish oil-omega-3 fatty acids 1000 MG capsule Take 2 g by mouth every morning.     Yes Historical Provider, MD  fluticasone (FLOVENT HFA) 44 MCG/ACT inhaler Inhale 2 puffs into the lungs 2 (two) times daily. 07/31/11 07/30/12 Yes Erick Blinks, MD  guaiFENesin (MUCINEX) 600 MG 12 hr tablet Take 1,200 mg  by mouth 2 (two) times daily as needed. congestion   Yes Historical Provider, MD  HYDROcodone-acetaminophen (NORCO) 10-325 MG per tablet Take 1 tablet by mouth every 6 (six) hours as needed. For pain    Yes Historical Provider, MD  labetalol (NORMODYNE) 200 MG tablet Take 200 mg by mouth 2 (two) times daily.     Yes Historical Provider, MD  lisinopril (PRINIVIL,ZESTRIL) 20 MG tablet Take 1 tablet (20 mg total) by mouth daily. 07/31/11 07/30/12 Yes Erick Blinks, MD  pantoprazole (PROTONIX) 40 MG tablet Take 1 tablet  (40 mg total) by mouth 2 (two) times daily before a meal. 07/31/11 07/30/12 Yes Erick Blinks, MD  diclofenac (CATAFLAM) 50 MG tablet Take 1 tablet (50 mg total) by mouth 2 (two) times daily. 08/30/11 08/29/12  Christiane Ha, MD  predniSONE (STERAPRED UNI-PAK) 10 MG tablet Take 40mg  po daily for 2 days then 30mg  po daily for 2 days then 20mg  po daily for 2 days then 10mg  po daily for 2 days then stop 08/30/11   Christiane Ha, MD    Social History:  reports that she quit smoking about 10 months ago. Her smoking use included Cigarettes. She has never used smokeless tobacco. She reports that she drinks about 1.2 ounces of alcohol per week. She reports that she does not use illicit drugs.  Family History  Problem Relation Age of Onset  . Asthma Mother   . Asthma Father   . Cancer Father     Review of Systems: Positives in bold Constitutional: Denies fever, chills, diaphoresis, appetite change and fatigue.  HEENT: Denies photophobia, eye pain, redness, hearing loss, ear pain, congestion, sore throat, rhinorrhea, sneezing, mouth sores, trouble swallowing, neck pain, neck stiffness and tinnitus.   Respiratory: Denies SOB, DOE, cough, chest tightness,  and wheezing.   Cardiovascular: Denies chest pain, palpitations and leg swelling.  Gastrointestinal: Denies nausea, vomiting, abdominal pain, diarrhea, constipation, blood in stool and abdominal distention.  Genitourinary: Denies dysuria, urgency, frequency, hematuria, flank pain and difficulty urinating.  Musculoskeletal: Denies myalgias, back pain, joint swelling, arthralgias and gait problem.  Skin: Denies pallor, rash and wound.  Neurological: Denies dizziness, seizures, syncope, weakness, light-headedness, numbness and headaches.  Hematological: Denies adenopathy. Easy bruising, personal or family bleeding history  Psychiatric/Behavioral: Denies suicidal ideation, mood changes, confusion, nervousness, sleep disturbance and  agitation   Physical Exam: Blood pressure 130/83, pulse 73, temperature 97.7 F (36.5 C), temperature source Oral, resp. rate 20, height 5\' 4"  (1.626 m), weight 70.4 kg (155 lb 3.3 oz), SpO2 99.00%. General: Patient is lying in bed, in no acute distress, alert and orientedx3 Neck: Supple HEENT: Normocephalic, atraumatic, pupils are equal and react to light Chest: Bilateral expiratory wheezes Cardiac: S1, S2, regular rate and rhythm Abdomen: Soft, nontender, nondistended, bowel sounds are active Studies: No cyanosis, clubbing or edema Neurological grossly intact, nonfocal Skin: Intact, no rashes  Labs on Admission:  Results for orders placed during the hospital encounter of 09/10/11 (from the past 48 hour(s))  CBC     Status: Normal   Collection Time   09/10/11  3:40 PM      Component Value Range Comment   WBC 8.7  4.0 - 10.5 (K/uL)    RBC 4.11  3.87 - 5.11 (MIL/uL)    Hemoglobin 13.4  12.0 - 15.0 (g/dL)    HCT 16.1  09.6 - 04.5 (%)    MCV 97.3  78.0 - 100.0 (fL)    MCH 32.6  26.0 - 34.0 (pg)  MCHC 33.5  30.0 - 36.0 (g/dL)    RDW 16.1  09.6 - 04.5 (%)    Platelets 257  150 - 400 (K/uL)   DIFFERENTIAL     Status: Abnormal   Collection Time   09/10/11  3:40 PM      Component Value Range Comment   Neutrophils Relative 64  43 - 77 (%)    Neutro Abs 5.5  1.7 - 7.7 (K/uL)    Lymphocytes Relative 22  12 - 46 (%)    Lymphs Abs 1.9  0.7 - 4.0 (K/uL)    Monocytes Relative 6  3 - 12 (%)    Monocytes Absolute 0.5  0.1 - 1.0 (K/uL)    Eosinophils Relative 8 (*) 0 - 5 (%)    Eosinophils Absolute 0.7  0.0 - 0.7 (K/uL)    Basophils Relative 1  0 - 1 (%)    Basophils Absolute 0.1  0.0 - 0.1 (K/uL)   BASIC METABOLIC PANEL     Status: Abnormal   Collection Time   09/10/11  3:40 PM      Component Value Range Comment   Sodium 135  135 - 145 (mEq/L)    Potassium 3.9  3.5 - 5.1 (mEq/L)    Chloride 100  96 - 112 (mEq/L)    CO2 23  19 - 32 (mEq/L)    Glucose, Bld 118 (*) 70 - 99 (mg/dL)     BUN 9  6 - 23 (mg/dL)    Creatinine, Ser 4.09  0.50 - 1.10 (mg/dL)    Calcium 9.5  8.4 - 10.5 (mg/dL)    GFR calc non Af Amer >90  >90 (mL/min)    GFR calc Af Amer >90  >90 (mL/min)     Radiological Exams on Admission: Dg Chest Port 1 View  09/10/2011  *RADIOLOGY REPORT*  Clinical Data: Asthma  PORTABLE CHEST - 1 VIEW  Comparison: 08/29/2011  Findings: Normal heart size.  Clear lungs.  IMPRESSION: Negative.  Original Report Authenticated By: Donavan Burnet, M.D.    Assessment/Plan Principal Problem:  *COPD exacerbation Active Problems:  Hypertension  GERD (gastroesophageal reflux disease)  Anxiety disorder  Plan:  Patient will be admitted to a medical bed. She will receive IV steroids, nebulization treatments, antibiotics. She'll receive supplemental oxygen this can be weaned as tolerated. We can wean her steroids once her respiratory status improves. She may likely need a longer taper. I advised her to remove any allergen sources for her house including pets which may be leading to her difficulty breathing. She reports that she has not been smoking. I Encouraged her regarding this and advised her to avoid any secondhand smoke. We will continue her chronic medications. Anxiety is likely also playing a role here. We'll continue her outpatient Valium.  Further orders per the clinical course   Time Spent on Admission:  Tekela Garguilo [facility] Pager: [phone] 09/10/2011, 7:07 PM

## 2011-09-10 NOTE — ED Provider Notes (Signed)
History   This chart was scribed for No att. providers found by Clarita Crane. The patient was seen in room [facility]. Patient's care was started at 1257.    CSN: [id number]  Arrival date & time 09/10/11  1257   None     Chief Complaint  Patient presents with  . Shortness of Breath    (Consider location/radiation/quality/duration/timing/severity/associated sxs/prior treatment) HPI [name] is a 46 y.o. female who presents to the Emergency Department complaining of constant severe SOB similar to previous onset yesterday and worsening since with associated wheezing. Patient notes using home nebulizer with mild relief yesterday but no relief today. Denies chest pain, diaphoresis, nausea, vomiting.   Past Medical History  Diagnosis Date  . Asthma   . COPD (chronic obstructive pulmonary disease)   . Hypertension   . GERD (gastroesophageal reflux disease)   . Anxiety disorder   . Chronic pain syndrome   . Anxiety   . Depression     Past Surgical History  Procedure Date  . Cholecystectomy   . Cesarean section   . Carpal tunnel release   . Esophagogastroduodenoscopy 07/31/2011    Procedure: ESOPHAGOGASTRODUODENOSCOPY (EGD);  Surgeon: Malissa Hippo, MD;  Location: [facility];  Service: Endoscopy;  Laterality: N/A;  Elease Hashimoto dilation 07/31/2011    Procedure: Elease Hashimoto DILATION;  Surgeon: Malissa Hippo, MD;  Location: [facility];  Service: Endoscopy;;    Family History  Problem Relation Age of Onset  . Asthma Mother   . Asthma Father   . Cancer Father     History  Substance Use Topics  . Smoking status: Former Smoker    Types: Cigarettes    Quit date: 10/25/2010  . Smokeless tobacco: Never Used  . Alcohol Use: 1.2 oz/week    2 Cans of beer per week    OB History    Grav Para Term Preterm Abortions TAB SAB Ect Mult Living                  Review of Systems 10 Systems reviewed and are negative for acute change except as noted in the  HPI.  Allergies  Review of patient's allergies indicates no known allergies.  Home Medications   Current Outpatient Rx  Name Route Sig Dispense Refill  . ACLIDINIUM BROMIDE 400 MCG/ACT IN AEPB Inhalation Inhale 1 puff into the lungs as needed. For shortness of breath    . ALBUTEROL SULFATE HFA 108 (90 BASE) MCG/ACT IN AERS Inhalation Inhale 2 puffs into the lungs every 6 (six) hours as needed for wheezing or shortness of breath. For shortness of breath 1 Inhaler 1  . ALBUTEROL SULFATE (2.5 MG/3ML) 0.083% IN NEBU Nebulization Take 2.5 mg by nebulization every 6 (six) hours as needed. For shortness of breath    . ALBUTEROL SULFATE (5 MG/ML) 0.5% IN NEBU Nebulization Take 0.5 mLs (2.5 mg total) by nebulization every 6 (six) hours as needed. For shortness of breath 20 mL   . ALBUTEROL SULFATE 2 MG PO TABS Oral Take 2 mg by mouth 4 (four) times daily.    Marland Kitchen AMLODIPINE BESYLATE 5 MG PO TABS Oral Take 5 mg by mouth daily.      . BUPROPION HCL ER (XL) 300 MG PO TB24 Oral Take 300 mg by mouth every morning.      Marland Kitchen DIAZEPAM 10 MG PO TABS Oral Take 10 mg by mouth daily as needed. For spasms/anxiety     . DICLOFENAC POTASSIUM 50 MG PO  TABS Oral Take 1 tablet (50 mg total) by mouth 2 (two) times daily. 8 tablet 0  . DOXYCYCLINE HYCLATE 100 MG PO TABS Oral Take 1 tablet (100 mg total) by mouth every 12 (twelve) hours. 12 tablet 0  . OMEGA-3 FATTY ACIDS 1000 MG PO CAPS Oral Take 2 g by mouth every morning.      Marland Kitchen FLUTICASONE PROPIONATE  HFA 44 MCG/ACT IN AERO Inhalation Inhale 2 puffs into the lungs 2 (two) times daily. 1 Inhaler 1  . GUAIFENESIN ER 600 MG PO TB12 Oral Take 2 tablets (1,200 mg total) by mouth 2 (two) times daily. 14 tablet 0  . HYDROCODONE-ACETAMINOPHEN 10-325 MG PO TABS Oral Take 1 tablet by mouth every 6 (six) hours as needed. For pain     . LABETALOL HCL 200 MG PO TABS Oral Take 200 mg by mouth 2 (two) times daily.      Marland Kitchen LISINOPRIL 20 MG PO TABS Oral Take 1 tablet (20 mg total) by mouth  daily. 30 tablet 0  . MIRTAZAPINE 15 MG PO TABS Oral Take 15 mg by mouth at bedtime as needed. For sleep    . PANTOPRAZOLE SODIUM 40 MG PO TBEC Oral Take 1 tablet (40 mg total) by mouth 2 (two) times daily before a meal. 30 tablet 1  . PREDNISONE (PAK) 10 MG PO TABS  Take 40mg  po daily for 2 days then 30mg  po daily for 2 days then 20mg  po daily for 2 days then 10mg  po daily for 2 days then stop 20 tablet 0    Ht 5\' 4"  (1.626 m)  Wt 160 lb (72.576 kg)  BMI 27.46 kg/m2  Physical Exam  Nursing note and vitals reviewed. Constitutional: She is oriented to person, place, and time. She appears well-developed and well-nourished. She appears distressed.  HENT:  Head: Normocephalic and atraumatic.  Eyes: EOM are normal.  Neck: Neck supple. No tracheal deviation present.  Cardiovascular: Regular rhythm.  Tachycardia present.   No murmur heard. Pulmonary/Chest: She is in respiratory distress. She has wheezes (throughout all long fields.). She has no rales.       Poor air movement. Difficulty with talking in full sentences. Use of accessory muscles of neck and abdomen. Tachypneic.   Abdominal: Soft. She exhibits no distension.  Musculoskeletal: Normal range of motion. She exhibits no edema.  Neurological: She is alert and oriented to person, place, and time. No sensory deficit.  Skin: Skin is warm and dry.    ED Course  Procedures (including critical care time)  DIAGNOSTIC STUDIES: Oxygen Saturation is 99 on [place], normal by my interpretation.   Results for orders placed during the hospital encounter of 09/10/11  CBC      Component Value Range   WBC 8.7  4.0 - 10.5 (K/uL)   RBC 4.11  3.87 - 5.11 (MIL/uL)   Hemoglobin 13.4  12.0 - 15.0 (g/dL)   HCT 45.4  09.8 - 11.9 (%)   MCV 97.3  78.0 - 100.0 (fL)   MCH 32.6  26.0 - 34.0 (pg)   MCHC 33.5  30.0 - 36.0 (g/dL)   RDW 14.7  82.9 - 56.2 (%)   Platelets 257  150 - 400 (K/uL)  DIFFERENTIAL      Component Value Range   Neutrophils Relative 64   43 - 77 (%)   Neutro Abs 5.5  1.7 - 7.7 (K/uL)   Lymphocytes Relative 22  12 - 46 (%)   Lymphs Abs 1.9  0.7 -  4.0 (K/uL)   Monocytes Relative 6  3 - 12 (%)   Monocytes Absolute 0.5  0.1 - 1.0 (K/uL)   Eosinophils Relative 8 (*) 0 - 5 (%)   Eosinophils Absolute 0.7  0.0 - 0.7 (K/uL)   Basophils Relative 1  0 - 1 (%)   Basophils Absolute 0.1  0.0 - 0.1 (K/uL)  BASIC METABOLIC PANEL      Component Value Range   Sodium 135  135 - 145 (mEq/L)   Potassium 3.9  3.5 - 5.1 (mEq/L)   Chloride 100  96 - 112 (mEq/L)   CO2 23  19 - 32 (mEq/L)   Glucose, Bld 118 (*) 70 - 99 (mg/dL)   BUN 9  6 - 23 (mg/dL)   Creatinine, Ser 1.61  0.50 - 1.10 (mg/dL)   Calcium 9.5  8.4 - 09.6 (mg/dL)   GFR calc non Af Amer >90  >90 (mL/min)   GFR calc Af Amer >90  >90 (mL/min)  BASIC METABOLIC PANEL      Component Value Range   Sodium 134 (*) 135 - 145 (mEq/L)   Potassium 4.0  3.5 - 5.1 (mEq/L)   Chloride 102  96 - 112 (mEq/L)   CO2 24  19 - 32 (mEq/L)   Glucose, Bld 119 (*) 70 - 99 (mg/dL)   BUN 10  6 - 23 (mg/dL)   Creatinine, Ser 0.45  0.50 - 1.10 (mg/dL)   Calcium 9.8  8.4 - 40.9 (mg/dL)   GFR calc non Af Amer >90  >90 (mL/min)   GFR calc Af Amer >90  >90 (mL/min)  CBC      Component Value Range   WBC 10.1  4.0 - 10.5 (K/uL)   RBC 4.04  3.87 - 5.11 (MIL/uL)   Hemoglobin 13.0  12.0 - 15.0 (g/dL)   HCT 81.1  91.4 - 78.2 (%)   MCV 96.0  78.0 - 100.0 (fL)   MCH 32.2  26.0 - 34.0 (pg)   MCHC 33.5  30.0 - 36.0 (g/dL)   RDW 95.6  21.3 - 08.6 (%)   Platelets 243  150 - 400 (K/uL)  PRO B NATRIURETIC PEPTIDE      Component Value Range   Pro B Natriuretic peptide (BNP) 63.6  0 - 125 (pg/mL)  Dg Chest Port 1 View  09/10/2011  *RADIOLOGY REPORT*  Clinical Data: Asthma  PORTABLE CHEST - 1 VIEW  Comparison: 08/29/2011  Findings: Normal heart size.  Clear lungs.  IMPRESSION: Negative.  Original Report Authenticated By: Donavan Burnet, M.D.   COORDINATION OF CARE: [phone] Patient breathing better. Continued  wheezing. Able to talk in full sentences. Rib cage is hurting. Ibuprofen ordered. 1445 Continued wheezing. Continued pain to the ribs with deep breathing.Hydrocondone ordered. 1535 Patient states that while breathing is easier and she is able to talk, she does not feel comfortable about leaving the hospital. Additional treatment ordered. Will arrange for admission. 50 Spoke with Dr. Kerry Hough who will admit the patient to med-surg.   MDM  Patient with h/o asthma here with asthma exacerbation that began yesterday. Recent hospitalization for asthma. Received nebulizer treatment x 2 and continuous treatment x 1 with improvement in air movement however, continued wheezing. Initiated steroid therapy. Will arrange for admission.Spoke with Dr. Kerry Hough who will admit patient. Pt stable in ED with no significant deterioration in condition.The patient appears reasonably stabilized for admission considering the current resources, flow, and capabilities available in the ED at this time, and I doubt any  other [facility] requiring further screening and/or treatment in the ED prior to admission.  I personally performed the services described in this documentation, which was scribed in my presence. The recorded information has been reviewed and considered.  MDM Reviewed: previous chart, nursing note and vitals Reviewed previous: labs and x-ray Interpretation: labs and x-ray Total time providing critical care: 35 minutes.          Nicoletta Dress. Colon Branch, MD 09/11/11 [phone]

## 2011-09-10 NOTE — ED Notes (Signed)
[facility] called after request from Pt and nurse Kendal Hymen to inform them that Pt was in the ER and won"t make her appointment latter this evening.

## 2011-09-10 NOTE — ED Notes (Signed)
memon paged to [phone]

## 2011-09-10 NOTE — ED Notes (Signed)
Respirations deep and even. Pt resting more comfortably at this time. Cardiac monitor showing NSR.

## 2011-09-10 NOTE — ED Notes (Signed)
Pt c/o difficulty breathing and cough x 2 days but worse since this am. Pt states that she has been using her inhalers and nebulizer but it is not helping. Pt had an appointment with Dr. Sherwood Gambler this afternoon but was unable to wait.

## 2011-09-10 NOTE — ED Notes (Signed)
Pt c/o being very anxious and states "my nerves are shot". Medicated IV. Respirations deep and even. Breath sounds equal bilaterally with bilateral expiratory wheezes.

## 2011-09-10 NOTE — ED Notes (Signed)
Respiratory rate decreased and states that she is breathing better after initial breathing treatment. Continuous nebulizer started by respiratory therapy. Cardiac monitor showing NSR.

## 2011-09-11 LAB — CBC
HCT: 38.8 % (ref 36.0–46.0)
Hemoglobin: 13 g/dL (ref 12.0–15.0)
MCH: 32.2 pg (ref 26.0–34.0)
MCHC: 33.5 g/dL (ref 30.0–36.0)
MCV: 96 fL (ref 78.0–100.0)
Platelets: 243 K/uL (ref 150–400)
RBC: 4.04 MIL/uL (ref 3.87–5.11)
RDW: 13.4 % (ref 11.5–15.5)
WBC: 10.1 K/uL (ref 4.0–10.5)

## 2011-09-11 LAB — BASIC METABOLIC PANEL
BUN: 10 mg/dL (ref 6–23)
Creatinine, Ser: 0.58 mg/dL (ref 0.50–1.10)
GFR calc Af Amer: >90 mL/min (ref >90)
GFR calc non Af Amer: >90 mL/min (ref >90)

## 2011-09-11 MED ORDER — SODIUM CHLORIDE 0.9 % IJ SOLN
INTRAMUSCULAR | Status: AC
  Administered 2011-09-11: 12:00:00
  Filled 2011-09-11: qty 3

## 2011-09-11 MED ORDER — DIAZEPAM 5 MG PO TABS: 10.0000 mg | ORAL_TABLET | Freq: Three times a day (TID) | ORAL | Status: DC | PRN

## 2011-09-11 MED ORDER — OXYCODONE HCL 5 MG PO TABS
5.0000 mg | ORAL_TABLET | Freq: Four times a day (QID) | ORAL | Status: DC | PRN
  Administered 2011-09-11 – 2011-09-12 (×3): 5 mg via ORAL
  Filled 2011-09-11 (×3): qty 1

## 2011-09-11 MED ORDER — LORAZEPAM 2 MG/ML IJ SOLN
1.0000 mg | Freq: Three times a day (TID) | INTRAMUSCULAR | Status: DC | PRN
  Administered 2011-09-11 – 2011-09-13 (×4): 1 mg via INTRAVENOUS
  Filled 2011-09-11 (×4): qty 1

## 2011-09-11 NOTE — Progress Notes (Signed)
Patient ID: [name], female   DOB: [birth year], 46 y.o.   MRN: [id number]  Subjective: No events overnight. Patient denies chest pain, shortness of breath, abdominal pain.  Objective:  Vital signs in last 24 hours:  Filed Vitals:   09/11/11 0557 09/11/11 0800 09/11/11 1400 09/11/11 1425  BP: 144/83  135/89   Pulse: 82  77   Temp: 97.8 F (36.6 C)  97.5 F (36.4 C)   TempSrc:   Oral   Resp: 20  20   Height:      Weight:      SpO2: 96% 99% 95% 98%    Intake/Output from previous day:   Intake/Output Summary (Last 24 hours) at 09/11/11 1931 Last data filed at 09/11/11 1700  Gross per 24 hour  Intake    740 ml  Output      1 ml  Net    739 ml    Physical Exam: General: Alert, awake, oriented x3, in no acute distress. HEENT: No bruits, no goiter. Moist mucous membranes, no scleral icterus, no conjunctival pallor. Heart: Regular rate and rhythm, S1/S2 +, no murmurs, rubs, gallops. Lungs: Clear to auscultation bilaterally with expiratory wheezing, no rhonchi, no rales.  Abdomen: Soft, nontender, nondistended, positive bowel sounds. Extremities: No clubbing or cyanosis, no pitting edema,  positive pedal pulses. Neuro: Grossly nonfocal.  Lab Results:  Basic Metabolic Panel:    Component Value Date/Time   NA 134* 09/11/2011 0555   K 4.0 09/11/2011 0555   CL 102 09/11/2011 0555   CO2 24 09/11/2011 0555   BUN 10 09/11/2011 0555   CREATININE 0.58 09/11/2011 0555   GLUCOSE 119* 09/11/2011 0555   CALCIUM 9.8 09/11/2011 0555   CBC:    Component Value Date/Time   WBC 10.1 09/11/2011 0555   HGB 13.0 09/11/2011 0555   HCT 38.8 09/11/2011 0555   PLT 243 09/11/2011 0555   MCV 96.0 09/11/2011 0555   NEUTROABS 5.5 09/10/2011 1540   LYMPHSABS 1.9 09/10/2011 1540   MONOABS 0.5 09/10/2011 1540   EOSABS 0.7 09/10/2011 1540   BASOSABS 0.1 09/10/2011 1540      Lab 09/11/11 0555 09/10/11 1540  WBC 10.1 8.7  HGB 13.0 13.4  HCT 38.8 40.0  PLT 243 257  MCV 96.0 97.3  MCH 32.2 32.6    MCHC 33.5 33.5  RDW 13.4 13.6  LYMPHSABS -- 1.9  MONOABS -- 0.5  EOSABS -- 0.7  BASOSABS -- 0.1  BANDABS -- --    Lab 09/11/11 0555 09/10/11 1540  NA 134* 135  K 4.0 3.9  CL 102 100  CO2 24 23  GLUCOSE 119* 118*  BUN 10 9  CREATININE 0.58 0.66  CALCIUM 9.8 9.5  MG -- --    Studies/Results: Dg Chest Port 1 View  09/10/2011  *RADIOLOGY REPORT*  Clinical Data: Asthma  PORTABLE CHEST - 1 VIEW  Comparison: 08/29/2011  Findings: Normal heart size.  Clear lungs.  IMPRESSION: Negative.  Original Report Authenticated By: Donavan Burnet, M.D.    Medications: Scheduled Meds:   . sodium chloride   Intravenous STAT  . albuterol  2.5 mg Nebulization Q6H  . amLODipine  5 mg Oral Daily  . buPROPion  300 mg Oral [id number]  . doxycycline  100 mg Oral Q12H  . enoxaparin  40 mg Subcutaneous Q24H  . fluticasone  2 puff Inhalation BID  . guaiFENesin  1,200 mg Oral BID  . HYDROcodone-acetaminophen  1 tablet Oral Once  . ipratropium  0.5  mg Nebulization Q6H  . labetalol  200 mg Oral BID  . lisinopril  20 mg Oral Daily  . methylPREDNISolone (SOLU-MEDROL) injection  80 mg Intravenous Q12H  . pantoprazole  40 mg Oral BID AC  . sodium chloride      . sodium chloride      . sodium chloride       Continuous Infusions:  PRN Meds:.acetaminophen, acetaminophen, albuterol, diazepam, guaiFENesin-dextromethorphan, HYDROcodone-acetaminophen, ondansetron (ZOFRAN) IV, ondansetron, oxyCODONE, DISCONTD: diazepam, DISCONTD: ketorolac  Assessment/Plan:  Principal Problem:  *COPD exacerbation - pt still with significant expiratory wheezing - she is maintaining good oxygen saturations - will continue supportive care with nebulizers PRN and scheduled - continue solumedrol and taper to PO once clinical improvement noted  Active Problems:  Hypertension - remain stable   GERD (gastroesophageal reflux disease) - continue protonix   Anxiety disorder - continue Ativan PRN   EDUCATION - test results  and diagnostic studies were discussed with patient - patient verbalized the understanding - questions were answered at the bedside and contact information was provided for additional questions or concerns   LOS: 1 day   MAGICK-Jaidy Cottam 09/11/2011, 7:31 PM  [facility] Pager: [phone]

## 2011-09-12 LAB — BASIC METABOLIC PANEL
Chloride: 104 mEq/L (ref 96–112)
Creatinine, Ser: 0.62 mg/dL (ref 0.50–1.10)
GFR calc Af Amer: >90 mL/min (ref >90)
GFR calc non Af Amer: >90 mL/min (ref >90)
Potassium: 4.1 mEq/L (ref 3.5–5.1)

## 2011-09-12 LAB — CBC
MCV: 96.9 fL (ref 78.0–100.0)
Platelets: 273 10*3/uL (ref 150–400)
RDW: 13.7 % (ref 11.5–15.5)
WBC: 11.4 10*3/uL — ABNORMAL HIGH (ref 4.0–10.5)

## 2011-09-12 MED ORDER — PREDNISONE 20 MG PO TABS
50.0000 mg | ORAL_TABLET | Freq: Every day | ORAL | Status: DC
  Administered 2011-09-13: 50 mg via ORAL
  Filled 2011-09-12: qty 2

## 2011-09-12 MED ORDER — MORPHINE SULFATE 2 MG/ML IJ SOLN
1.0000 mg | INTRAMUSCULAR | Status: DC | PRN
  Administered 2011-09-12 – 2011-09-13 (×4): 1 mg via INTRAVENOUS
  Filled 2011-09-12 (×4): qty 1

## 2011-09-12 MED ORDER — OXYCODONE HCL 5 MG PO TABS
ORAL_TABLET | ORAL | Status: AC
  Administered 2011-09-12: 5 mg
  Filled 2011-09-12: qty 1

## 2011-09-12 MED ORDER — MORPHINE SULFATE 4 MG/ML IJ SOLN
INTRAMUSCULAR | Status: AC
  Administered 2011-09-12: 1 mg
  Filled 2011-09-12: qty 1

## 2011-09-12 MED ORDER — SODIUM CHLORIDE 0.9 % IJ SOLN
INTRAMUSCULAR | Status: AC
  Administered 2011-09-12: 10 mL
  Filled 2011-09-12: qty 3

## 2011-09-12 NOTE — Progress Notes (Signed)
Patient ID: [name], female   DOB: 1965/11/16, 46 y.o.   MRN: [id number]  Subjective: No events overnight. Patient denies chest pain. Reports an improvement in shortness of breath.  Objective:  Vital signs in last 24 hours:  Filed Vitals:   09/12/11 0605 09/12/11 0745 09/12/11 1431 09/12/11 1500  BP: 132/75   145/83  Pulse: 67   79  Temp: 97.9 F (36.6 C)   97.3 F (36.3 C)  TempSrc: Oral   Oral  Resp: 19   18  Height:      Weight:      SpO2: 98% 97% 97% 99%    Intake/Output from previous day:   Intake/Output Summary (Last 24 hours) at 09/12/11 1542 Last data filed at 09/11/11 2233  Gross per 24 hour  Intake    360 ml  Output      1 ml  Net    359 ml    Physical Exam: General: Alert, awake, oriented x3, in no acute distress. HEENT: No bruits, no goiter. Moist mucous membranes, no scleral icterus, no conjunctival pallor. Heart: Regular rate and rhythm, S1/S2 +, no murmurs, rubs, gallops. Lungs: Clear to auscultation bilaterally with end expiratory wheezing, no rhonchi, no rales.  Abdomen: Soft, nontender, nondistended, positive bowel sounds. Extremities: No clubbing or cyanosis, no pitting edema,  positive pedal pulses. Neuro: Grossly nonfocal.  Lab Results:  Basic Metabolic Panel:    Component Value Date/Time   NA 138 09/12/2011 0533   K 4.1 09/12/2011 0533   CL 104 09/12/2011 0533   CO2 24 09/12/2011 0533   BUN 15 09/12/2011 0533   CREATININE 0.62 09/12/2011 0533   GLUCOSE 167* 09/12/2011 0533   CALCIUM 9.3 09/12/2011 0533   CBC:    Component Value Date/Time   WBC 11.4* 09/12/2011 0533   HGB 12.5 09/12/2011 0533   HCT 37.8 09/12/2011 0533   PLT 273 09/12/2011 0533   MCV 96.9 09/12/2011 0533   NEUTROABS 5.5 09/10/2011 1540   LYMPHSABS 1.9 09/10/2011 1540   MONOABS 0.5 09/10/2011 1540   EOSABS 0.7 09/10/2011 1540   BASOSABS 0.1 09/10/2011 1540      Lab 09/12/11 0533 09/11/11 0555 09/10/11 1540  WBC 11.4* 10.1 8.7  HGB 12.5 13.0 13.4  HCT 37.8 38.8 40.0   PLT 273 243 257  MCV 96.9 96.0 97.3  MCH 32.1 32.2 32.6  MCHC 33.1 33.5 33.5  RDW 13.7 13.4 13.6  LYMPHSABS -- -- 1.9  MONOABS -- -- 0.5  EOSABS -- -- 0.7  BASOSABS -- -- 0.1  BANDABS -- -- --    Lab 09/12/11 0533 09/11/11 0555 09/10/11 1540  NA 138 134* 135  K 4.1 4.0 3.9  CL 104 102 100  CO2 24 24 23   GLUCOSE 167* 119* 118*  BUN 15 10 9   CREATININE 0.62 0.58 0.66  CALCIUM 9.3 9.8 9.5  MG -- -- --    Studies/Results: Dg Chest Port 1 View 09/10/2011  IMPRESSION: Negative.    Medications: Scheduled Meds:   . sodium chloride   Intravenous STAT  . albuterol  2.5 mg Nebulization Q6H  . amLODipine  5 mg Oral Daily  . buPROPion  300 mg Oral BH-q7a  . doxycycline  100 mg Oral Q12H  . enoxaparin  40 mg Subcutaneous Q24H  . fluticasone  2 puff Inhalation BID  . guaiFENesin  1,200 mg Oral BID  . ipratropium  0.5 mg Nebulization Q6H  . labetalol  200 mg Oral BID  . lisinopril  20 mg Oral Daily  . methylPREDNISolone (SOLU-MEDROL) injection  80 mg Intravenous Q12H  . morphine      . pantoprazole  40 mg Oral BID AC   Continuous Infusions:  PRN Meds:.acetaminophen, acetaminophen, albuterol, guaiFENesin-dextromethorphan, HYDROcodone-acetaminophen, LORazepam, morphine injection, ondansetron (ZOFRAN) IV, ondansetron, oxyCODONE, DISCONTD: diazepam, DISCONTD: diazepam, DISCONTD: ketorolac  Assessment/Plan:  Principal Problem:  *COPD exacerbation - pt is clinically improving and maintaining oxygen saturations above 95% on 2 L nasal cannula - We'll continue to monitor vitals per floor - Continue nebulizers as scheduled and as needed along with steroids - Since wheezing improved on today's physical exam we'll change Solu-Medrol to prednisone PO - Reassess clinical status in the morning - Pt will most likely be ready for discharge in 1 to 2 days  Active Problems:  Hypertension - stable   GERD (gastroesophageal reflux disease) - stable   Anxiety disorder - stable    EDUCATION - test results and diagnostic studies were discussed with patient - patient verbalized the understanding - questions were answered at the bedside and contact information was provided for additional questions or concerns   LOS: 2 days   MAGICK-[name] 09/12/2011, 3:42 PM  [facility] Pager: [phone]

## 2011-09-12 NOTE — Progress Notes (Signed)
CARE MANAGEMENT NOTE 09/12/2011  Patient:  [name], [name]   Account Number:  [id number]  Date Initiated:  09/12/2011  Documentation initiated by:  Rosemary Holms  Subjective/Objective Assessment:   pt admitted from home w/ family. Pt c/o asthma. PTA lived independently.     Action/Plan:   spoke to pt at bedside. No HH needs identified at this time.   Anticipated DC Date:  09/14/2011   Anticipated DC Plan:  HOME/SELF CARE      DC Planning Services  CM consult      Choice offered to / List presented to:             Status of service:  In process, will continue to follow Medicare Important Message given?   (If response is "NO", the following Medicare IM given date fields will be blank) Date Medicare IM given:   Date Additional Medicare IM given:    Discharge Disposition:    Per UR Regulation:    Comments:  09/12/11 1500 Annalysia Willenbring Leanord Hawking RN BSN CM

## 2011-09-13 LAB — CBC
HCT: 35.9 % — ABNORMAL LOW (ref 36.0–46.0)
RDW: 13.8 % (ref 11.5–15.5)
WBC: 13.7 10*3/uL — ABNORMAL HIGH (ref 4.0–10.5)

## 2011-09-13 LAB — BASIC METABOLIC PANEL
BUN: 11 mg/dL (ref 6–23)
Chloride: 105 mEq/L (ref 96–112)
GFR calc Af Amer: >90 mL/min (ref >90)
Potassium: 3.8 mEq/L (ref 3.5–5.1)
Sodium: 139 mEq/L (ref 135–145)

## 2011-09-13 MED ORDER — PREDNISONE 20 MG PO TABS: ORAL_TABLET | ORAL | Status: DC

## 2011-09-13 MED ORDER — FLUTICASONE-SALMETEROL 250-50 MCG/DOSE IN AEPB: 1.0000 | INHALATION_SPRAY | Freq: Two times a day (BID) | RESPIRATORY_TRACT | Status: DC

## 2011-09-13 MED ORDER — DOXYCYCLINE HYCLATE 100 MG PO TABS: 100.0000 mg | ORAL_TABLET | Freq: Two times a day (BID) | ORAL | Status: AC

## 2011-09-13 MED ORDER — GUAIFENESIN-DM 100-10 MG/5ML PO SYRP: 5.0000 mL | ORAL_SOLUTION | ORAL | Status: AC | PRN

## 2011-09-13 MED ORDER — SODIUM CHLORIDE 0.9 % IJ SOLN
INTRAMUSCULAR | Status: AC
  Filled 2011-09-13: qty 3

## 2011-09-13 MED ORDER — NICOTINE 21 MG/24HR TD PT24: 1.0000 | MEDICATED_PATCH | TRANSDERMAL | Status: AC

## 2011-09-13 NOTE — Progress Notes (Signed)
WRITER REVIEWED AND DISCUSSED DISCHARGE INSTRUCTIONS AND MEDICATIONS, VERBALIZED UNDERSTANDING.  ENCOURAGED TO CALL WITH ANY QUESTIONS THAT MAY ARISE.  PT TOOK ALL BELONGINGS AND WRITER GAVE HER INHALER X1.   PT WHEEL CHAIRED OUT IN STABLE CONDITION AND IN NO DISTRESS.

## 2011-09-13 NOTE — Discharge Summary (Signed)
Physician Discharge Summary  Patient ID: [name] MRN: [id number] DOB/AGE: [birth year] 44 y.o. Primary Care Physician:FUSCO,LAWRENCE J., MD, MD Admit date: 09/10/2011 Discharge date: 09/13/2011    Discharge Diagnoses:  1. Exacerbation of COPD. 2. Hypertension. 3. GERD. 4. Anxiety.   Medication List  As of 09/13/2011 11:15 AM   STOP taking these medications         diclofenac 50 MG tablet      fluticasone 44 MCG/ACT inhaler      MUCINEX 600 MG 12 hr tablet      predniSONE 10 MG tablet         TAKE these medications         albuterol 2 MG tablet   Commonly known as: PROVENTIL   Take 2 mg by mouth 4 (four) times daily.      albuterol (2.5 MG/3ML) 0.083% nebulizer solution   Commonly known as: PROVENTIL   Take 2.5 mg by nebulization every 6 (six) hours as needed. For shortness of breath      albuterol 108 (90 BASE) MCG/ACT inhaler   Commonly known as: PROVENTIL HFA;VENTOLIN HFA   Inhale 2 puffs into the lungs every 6 (six) hours as needed for wheezing or shortness of breath. For shortness of breath      amLODipine 5 MG tablet   Commonly known as: NORVASC   Take 5 mg by mouth daily.      buPROPion 300 MG 24 hr tablet   Commonly known as: WELLBUTRIN XL   Take 300 mg by mouth every morning.      diazepam 10 MG tablet   Commonly known as: VALIUM   Take 10 mg by mouth daily as needed. For spasms/anxiety      doxycycline 100 MG tablet   Commonly known as: VIBRA-TABS   Take 1 tablet (100 mg total) by mouth every 12 (twelve) hours.      fish oil-omega-3 fatty acids 1000 MG capsule   Take 2 g by mouth every morning.      Fluticasone-Salmeterol 250-50 MCG/DOSE Aepb   Commonly known as: ADVAIR   Inhale 1 puff into the lungs 2 (two) times daily.      guaiFENesin-dextromethorphan 100-10 MG/5ML syrup   Commonly known as: ROBITUSSIN DM   Take 5 mLs by mouth every 4 (four) hours as needed for cough.      HYDROcodone-acetaminophen 10-325 MG per tablet   Commonly  known as: NORCO   Take 1 tablet by mouth every 6 (six) hours as needed. For pain      labetalol 200 MG tablet   Commonly known as: NORMODYNE   Take 200 mg by mouth 2 (two) times daily.      lisinopril 20 MG tablet   Commonly known as: PRINIVIL,ZESTRIL   Take 1 tablet (20 mg total) by mouth daily.      nicotine 21 mg/24hr patch   Commonly known as: NICODERM CQ - dosed in mg/24 hours   Place 1 patch onto the skin daily.      pantoprazole 40 MG tablet   Commonly known as: PROTONIX   Take 1 tablet (40 mg total) by mouth 2 (two) times daily before a meal.      predniSONE 20 MG tablet   Commonly known as: DELTASONE   Take 2 tablets every day for 4 days, then 1 tablet everyday for 4 days, then half tablet every day for 4 days, then STOP.      TUDORZA PRESSAIR 400 MCG/ACT Aepb  Generic drug: Aclidinium Bromide   Inhale 1 puff into the lungs as needed. For shortness of breath            Discharged Condition: Improved and stable.    Consults: None.  Significant Diagnostic Studies: Dg Chest 2 View  08/29/2011  *RADIOLOGY REPORT*  Clinical Data: Chest pain and shortness of breath.  CHEST - 2 VIEW  Comparison: 07/28/2011.  Findings: The cardiac silhouette, mediastinal and hilar contours are within normal limits and stable. The lungs are clear.  No pleural effusions.  The bony thorax is intact  IMPRESSION: Normal chest x-ray.  Original Report Authenticated By: P. Loralie Champagne, M.D.   Dg Chest Port 1 View  09/10/2011  *RADIOLOGY REPORT*  Clinical Data: Asthma  PORTABLE CHEST - 1 VIEW  Comparison: 08/29/2011  Findings: Normal heart size.  Clear lungs.  IMPRESSION: Negative.  Original Report Authenticated By: Donavan Burnet, M.D.    Lab Results: Basic Metabolic Panel:  Basename 09/13/11 0502 09/12/11 0533  NA 139 138  K 3.8 4.1  CL 105 104  CO2 27 24  GLUCOSE 113* 167*  BUN 11 15  CREATININE 0.64 0.62  CALCIUM 9.2 9.3  MG -- --  PHOS -- --       CBC:  Basename  09/13/11 0502 09/12/11 0533 09/10/11 1540  WBC 13.7* 11.4* --  NEUTROABS -- -- 5.5  HGB 11.9* 12.5 --  HCT 35.9* 37.8 --  MCV 97.6 96.9 --  PLT 274 273 --       Hospital Course: This 47 year old lady was admitted with symptoms of wheezing and dyspnea. She has a nebulizer machine at home which did not give her much benefit. She doesn't cough but this was without productive of any purulent sputum. It was felt that she had an exacerbation of COPD. She was admitted to the hospital and started on intravenous steroids and antibiotics. She did well and today she feels much improved. She did quit smoking approximately 4-5 months ago. She is keen to go home now.  Discharge Exam: Blood pressure 158/88, pulse 76, temperature 97.2 F (36.2 C), temperature source Oral, resp. rate 18, height 5\' 4"  (1.626 m), weight 70.4 kg (155 lb 3.3 oz), SpO2 97.00%. She looks systemically well. Lung fields show bilateral wheezing which I think is her baseline. Her no crackles or bronchial breathing. There is no increased work of breathing. There is no peripheral or central cyanosis. She is alert and orientated.  Disposition: Home. She will need a tapering course of prednisone and completion of antibiotics. I've given her a prescription for Advair inhaler which I think may help her. She will followup with her primary care physician.  Discharge Orders    Future Orders Please Complete By Expires   Diet - low sodium heart healthy      Increase activity slowly         Follow-up Information    Follow up with Cassell Smiles., MD .         SignedWilson Singer Pager [phone]  09/13/2011, 11:15 AM

## 2011-09-20 ENCOUNTER — Emergency Department (HOSPITAL_COMMUNITY): Payer: Medicaid Other

## 2011-09-20 ENCOUNTER — Encounter (HOSPITAL_COMMUNITY): Payer: Self-pay | Admitting: Emergency Medicine

## 2011-09-20 ENCOUNTER — Emergency Department (HOSPITAL_COMMUNITY)
Admission: EM | Admit: 2011-09-20 | Discharge: 2011-09-20 | Disposition: A | Discharge status: Home / Self Care | Payer: Medicaid Other | Attending: Emergency Medicine | Admitting: Emergency Medicine

## 2011-09-20 DIAGNOSIS — F3289 Other specified depressive episodes: Secondary | ICD-10-CM | POA: Insufficient documentation

## 2011-09-20 DIAGNOSIS — J449 Chronic obstructive pulmonary disease, unspecified: Secondary | ICD-10-CM | POA: Insufficient documentation

## 2011-09-20 DIAGNOSIS — I10 Essential (primary) hypertension: Secondary | ICD-10-CM | POA: Insufficient documentation

## 2011-09-20 DIAGNOSIS — M25569 Pain in unspecified knee: Secondary | ICD-10-CM | POA: Insufficient documentation

## 2011-09-20 DIAGNOSIS — F329 Major depressive disorder, single episode, unspecified: Secondary | ICD-10-CM | POA: Insufficient documentation

## 2011-09-20 DIAGNOSIS — J4489 Other specified chronic obstructive pulmonary disease: Secondary | ICD-10-CM | POA: Insufficient documentation

## 2011-09-20 DIAGNOSIS — G894 Chronic pain syndrome: Secondary | ICD-10-CM | POA: Insufficient documentation

## 2011-09-20 DIAGNOSIS — Z79899 Other long term (current) drug therapy: Secondary | ICD-10-CM | POA: Insufficient documentation

## 2011-09-20 DIAGNOSIS — M25561 Pain in right knee: Secondary | ICD-10-CM

## 2011-09-20 DIAGNOSIS — F411 Generalized anxiety disorder: Secondary | ICD-10-CM | POA: Insufficient documentation

## 2011-09-20 DIAGNOSIS — K219 Gastro-esophageal reflux disease without esophagitis: Secondary | ICD-10-CM | POA: Insufficient documentation

## 2011-09-20 LAB — DIFFERENTIAL
Basophils Absolute: 0 10*3/uL (ref 0.0–0.1)
Basophils Relative: 0 % (ref 0–1)
Eosinophils Absolute: 0.2 10*3/uL (ref 0.0–0.7)
Eosinophils Relative: 1 % (ref 0–5)
Lymphocytes Relative: 11 % — ABNORMAL LOW (ref 12–46)
Lymphs Abs: 1.8 10*3/uL (ref 0.7–4.0)
Monocytes Absolute: 0.7 10*3/uL (ref 0.1–1.0)
Monocytes Relative: 5 % (ref 3–12)
Neutro Abs: 13.2 10*3/uL — ABNORMAL HIGH (ref 1.7–7.7)
Neutrophils Relative %: 83 % — ABNORMAL HIGH (ref 43–77)

## 2011-09-20 LAB — BASIC METABOLIC PANEL
BUN: 17 mg/dL (ref 6–23)
CO2: 25 mEq/L (ref 19–32)
Calcium: 8.8 mg/dL (ref 8.4–10.5)
Chloride: 101 mEq/L (ref 96–112)
Creatinine, Ser: 0.61 mg/dL (ref 0.50–1.10)
GFR calc Af Amer: >90 mL/min (ref >90)
GFR calc non Af Amer: >90 mL/min (ref >90)
Glucose, Bld: 96 mg/dL (ref 70–99)
Potassium: 6.2 mEq/L — ABNORMAL HIGH (ref 3.5–5.1)
Sodium: 136 mEq/L (ref 135–145)

## 2011-09-20 LAB — CK: Total CK: 78 U/L (ref 7–177)

## 2011-09-20 LAB — CBC
HCT: 38.6 % (ref 36.0–46.0)
Hemoglobin: 12.9 g/dL (ref 12.0–15.0)
MCH: 32.7 pg (ref 26.0–34.0)
MCHC: 33.4 g/dL (ref 30.0–36.0)
MCV: 98 fL (ref 78.0–100.0)
Platelets: 256 10*3/uL (ref 150–400)
RBC: 3.94 MIL/uL (ref 3.87–5.11)
RDW: 14.5 % (ref 11.5–15.5)
WBC: 15.9 10*3/uL — ABNORMAL HIGH (ref 4.0–10.5)

## 2011-09-20 LAB — SEDIMENTATION RATE: Sed Rate: 2 mm/hr (ref 0–22)

## 2011-09-20 MED ORDER — OXYCODONE-ACETAMINOPHEN 5-325 MG PO TABS
1.0000 | ORAL_TABLET | Freq: Once | ORAL | Status: AC
  Administered 2011-09-20: 1 via ORAL
  Filled 2011-09-20: qty 1

## 2011-09-20 MED ORDER — HYDROMORPHONE HCL PF 1 MG/ML IJ SOLN
1.0000 mg | Freq: Once | INTRAMUSCULAR | Status: AC
  Administered 2011-09-20: 1 mg via INTRAVENOUS
  Filled 2011-09-20: qty 1

## 2011-09-20 MED ORDER — OXYCODONE-ACETAMINOPHEN 5-325 MG PO TABS: 1.0000 | ORAL_TABLET | Freq: Four times a day (QID) | ORAL | Status: AC | PRN

## 2011-09-20 MED ORDER — HYDROMORPHONE HCL PF 1 MG/ML IJ SOLN
INTRAMUSCULAR | Status: AC
  Filled 2011-09-20: qty 1

## 2011-09-20 NOTE — ED Notes (Signed)
Patient transported to X-ray via wheelchair 

## 2011-09-20 NOTE — ED Provider Notes (Signed)
History     CSN: [id number]  Arrival date & time 09/20/11  4098   First MD Initiated Contact with Patient 09/20/11 1007      Chief Complaint  Patient presents with  . Knee Pain    (Consider location/radiation/quality/duration/timing/severity/associated sxs/prior treatment) Patient is a 46 y.o. female presenting with knee pain. The history is provided by the patient.  Knee Pain   Patient presents to the emergency department with bilateral knee pain stating that her pain is significant she cannot walk.  She denies swelling redness or warmth to either knee.  States it hurts to move him.  Patient denies chest pain, shortness of breath, calf pain, nausea/vomiting, fever, abdominal pain, diarrhea, or urinary symptoms.  Patient states that she was recently discharged from the hospital following a COPD exacerbation. Past Medical History  Diagnosis Date  . Asthma   . COPD (chronic obstructive pulmonary disease)   . Hypertension   . GERD (gastroesophageal reflux disease)   . Anxiety disorder   . Chronic pain syndrome   . Anxiety   . Depression     Past Surgical History  Procedure Date  . Cholecystectomy   . Cesarean section   . Carpal tunnel release   . Esophagogastroduodenoscopy 07/31/2011    Procedure: ESOPHAGOGASTRODUODENOSCOPY (EGD);  Surgeon: Malissa Hippo, MD;  Location: [facility];  Service: Endoscopy;  Laterality: N/A;  Elease Hashimoto dilation 07/31/2011    Procedure: Elease Hashimoto DILATION;  Surgeon: Malissa Hippo, MD;  Location: [facility];  Service: Endoscopy;;    Family History  Problem Relation Age of Onset  . Asthma Mother   . Asthma Father   . Cancer Father     History  Substance Use Topics  . Smoking status: Former Smoker    Types: Cigarettes    Quit date: 10/25/2010  . Smokeless tobacco: Never Used  . Alcohol Use: 1.2 oz/week    2 Cans of beer per week    OB History    Grav Para Term Preterm Abortions TAB SAB Ect Mult Living                  Review  of Systems All pertinent positives and negatives reviewed in the history of present illness  Allergies  Review of patient's allergies indicates no known allergies.  Home Medications   Current Outpatient Rx  Name Route Sig Dispense Refill  . ACLIDINIUM BROMIDE 400 MCG/ACT IN AEPB Inhalation Inhale 1 puff into the lungs as needed. For shortness of breath    . ALBUTEROL SULFATE HFA 108 (90 BASE) MCG/ACT IN AERS Inhalation Inhale 2 puffs into the lungs every 6 (six) hours as needed for wheezing or shortness of breath. For shortness of breath 1 Inhaler 1  . ALBUTEROL SULFATE (2.5 MG/3ML) 0.083% IN NEBU Nebulization Take 2.5 mg by nebulization every 6 (six) hours as needed. For shortness of breath    . ALBUTEROL SULFATE 2 MG PO TABS Oral Take 2 mg by mouth 4 (four) times daily.    Marland Kitchen AMLODIPINE BESYLATE 5 MG PO TABS Oral Take 5 mg by mouth daily.      . BUPROPION HCL ER (XL) 300 MG PO TB24 Oral Take 300 mg by mouth every morning.      Marland Kitchen DIAZEPAM 10 MG PO TABS Oral Take 10 mg by mouth daily as needed. For spasms/anxiety     . DOXYCYCLINE HYCLATE 100 MG PO TABS Oral Take 1 tablet (100 mg total) by mouth every 12 (twelve) hours.  14 tablet 0  . OMEGA-3 FATTY ACIDS 1000 MG PO CAPS Oral Take 2 g by mouth every morning.      Marland Kitchen FLUTICASONE-SALMETEROL 250-50 MCG/DOSE IN AEPB Inhalation Inhale 1 puff into the lungs 2 (two) times daily. 60 each 0  . GUAIFENESIN-DM 100-10 MG/5ML PO SYRP Oral Take 5 mLs by mouth every 4 (four) hours as needed for cough. 118 mL 0  . HYDROCODONE-ACETAMINOPHEN 10-325 MG PO TABS Oral Take 1 tablet by mouth every 6 (six) hours as needed. For pain     . LABETALOL HCL 200 MG PO TABS Oral Take 200 mg by mouth 2 (two) times daily.      Marland Kitchen LISINOPRIL 20 MG PO TABS Oral Take 1 tablet (20 mg total) by mouth daily. 30 tablet 0  . NICOTINE 21 MG/24HR TD PT24 Transdermal Place 1 patch onto the skin daily. 28 patch 0  . PANTOPRAZOLE SODIUM 40 MG PO TBEC Oral Take 1 tablet (40 mg total) by mouth  2 (two) times daily before a meal. 30 tablet 1  . PREDNISONE 20 MG PO TABS  Take 2 tablets every day for 4 days, then 1 tablet everyday for 4 days, then half tablet every day for 4 days, then STOP. 14 tablet 0    BP 132/102  Pulse 95  Temp(Src) 98.1 F (36.7 C) (Oral)  Resp 22  SpO2 98%  Physical Exam  Constitutional: She appears well-developed and well-nourished. She appears distressed.  Musculoskeletal:       Right knee: She exhibits no swelling, no effusion, no ecchymosis, no deformity and no erythema.       Left knee: She exhibits no swelling, no effusion, no ecchymosis, no deformity and no erythema.  Psychiatric: Her mood appears anxious.    ED Course  Procedures (including critical care time)  Labs Reviewed  CBC - Abnormal; Notable for the following:    WBC 15.9 (*)    All other components within normal limits  DIFFERENTIAL - Abnormal; Notable for the following:    Neutrophils Relative 83 (*)    Neutro Abs 13.2 (*)    Lymphocytes Relative 11 (*)    All other components within normal limits  BASIC METABOLIC PANEL - Abnormal; Notable for the following:    Potassium 6.2 (*) HEMOLYSIS AT THIS LEVEL MAY AFFECT RESULT   All other components within normal limits  CK  SEDIMENTATION RATE   Dg Knee Complete 4 Views Left  09/20/2011  *RADIOLOGY REPORT*  Clinical Data: Knee pain  LEFT KNEE - COMPLETE 4+ VIEW  Comparison: None.  Findings: No acute fracture and no dislocation.  Unremarkable soft tissues.  Mild degenerative change in the lateral compartment.  IMPRESSION: No acute bony pathology.  Original Report Authenticated By: Donavan Burnet, M.D.   Dg Knee Complete 4 Views Right  09/20/2011  *RADIOLOGY REPORT*  Clinical Data: Knee pain  RIGHT KNEE - COMPLETE 4+ VIEW  Comparison: None.  Findings: No acute fracture.  No dislocation.  Unremarkable soft tissues.  IMPRESSION: No acute bony pathology.  Original Report Authenticated By: Donavan Burnet, M.D.     1. Knee pain, bilateral     Patient is not having swelling to the knee joint redness or warmth to either joint.  Patient is range of motion and painful but can make it to the full range of motion.  It seems unlikely this would be a septic situation for either joint based on her history of present illness physical exam and laboratory test  findings.  Patient sent to the CDU for further evaluation and monitoring.   MDM  MDM Reviewed: previous chart, nursing note and vitals Interpretation: labs and x-ray           Carlyle Dolly, PA-C 09/20/11 1504

## 2011-09-20 NOTE — ED Notes (Signed)
Pt taken to vehicle via wheelchair without incident.

## 2011-09-20 NOTE — ED Provider Notes (Signed)
Medical screening examination/treatment/procedure(s) were conducted as a shared visit with non-physician practitioner(s) and myself.  I personally evaluated the patient during the encounter Patient is complaining of bilateral knee pain that started today. She has a history of COPD and is on steroids and was recently discharged from the hospital however it is both knees that are hurting. There is no focal signs of infection. There is no effusion, redness, swelling or warmth to the joints. This would be very unusual to be a septic joint considering both knees hurt. She is not complaining of any back pain and this does not seem like a particular pain. Her lab work including sedimentation rate is within normal limits except for a white count of 15,000 however she's been on steroids. Patient was given the option for knee aspiration despite no effusion on x-ray and she declined.  Gwyneth Sprout, MD 09/20/11 [phone]

## 2011-09-20 NOTE — ED Provider Notes (Signed)
Assumed care of patient in the CDU.  Patient came in today with bilateral knee pain.  No acute injury. Patient awaiting bilateral knee xrays and labs.  Plan is for patient to be discharged if labs are normal and she is ambulatory.  Patient in no acute distress, Alert and orientated x 3, Full ROM of both knees, no edema, erythema, or warmth to the touch.    Reassessed patient and discussed lab results.  Patient WBC mildly elevated, however patient has recently been on Prednisone.  Patient is ambulatory at this time after pain medication.  Patient offered knee aspiration, but declined.  Patient given short course of pain medication and instructed to follow up with PCP.  Patient and mother in agreement with the plan.  Pascal Lux Plato, PA-C 09/20/11 1621

## 2011-09-20 NOTE — ED Notes (Signed)
Pt c/o bilateral knee pain starting yesterday morning; pt sts unable to walk due to pain; pt sts recently discharged from [facility] for COPD but sts breathing better today; pt denies injury; pt tearful

## 2011-09-20 NOTE — ED Notes (Signed)
Mother at bedside. Ice packs refilled and placed on the pt's knees.  She rates her pain now at 5/10 stating that she feels much better.

## 2011-09-21 NOTE — ED Provider Notes (Signed)
Medical screening examination/treatment/procedure(s) were conducted as a shared visit with non-physician practitioner(s) and myself.  I personally evaluated the patient during the encounter   Gwyneth Sprout, MD 09/21/11 1549

## 2012-02-09 ENCOUNTER — Emergency Department (HOSPITAL_COMMUNITY)
Admission: EM | Admit: 2012-02-09 | Discharge: 2012-02-09 | Disposition: A | Discharge status: Home / Self Care | Payer: Medicaid Other | Attending: Emergency Medicine | Admitting: Emergency Medicine

## 2012-02-09 ENCOUNTER — Encounter (HOSPITAL_COMMUNITY): Payer: Self-pay | Admitting: *Deleted

## 2012-02-09 ENCOUNTER — Emergency Department (HOSPITAL_COMMUNITY): Payer: Medicaid Other

## 2012-02-09 DIAGNOSIS — Z87891 Personal history of nicotine dependence: Secondary | ICD-10-CM | POA: Insufficient documentation

## 2012-02-09 DIAGNOSIS — M25569 Pain in unspecified knee: Secondary | ICD-10-CM

## 2012-02-09 DIAGNOSIS — G894 Chronic pain syndrome: Secondary | ICD-10-CM | POA: Insufficient documentation

## 2012-02-09 DIAGNOSIS — R29898 Other symptoms and signs involving the musculoskeletal system: Secondary | ICD-10-CM | POA: Insufficient documentation

## 2012-02-09 DIAGNOSIS — K219 Gastro-esophageal reflux disease without esophagitis: Secondary | ICD-10-CM | POA: Insufficient documentation

## 2012-02-09 DIAGNOSIS — I10 Essential (primary) hypertension: Secondary | ICD-10-CM | POA: Insufficient documentation

## 2012-02-09 DIAGNOSIS — J449 Chronic obstructive pulmonary disease, unspecified: Secondary | ICD-10-CM | POA: Insufficient documentation

## 2012-02-09 DIAGNOSIS — J4489 Other specified chronic obstructive pulmonary disease: Secondary | ICD-10-CM | POA: Insufficient documentation

## 2012-02-09 NOTE — ED Notes (Signed)
Pt has ?cyst to left knee that she noticed yesterday, pt states that when she noticed the area on her  Left knee yesterday her left foot was tingling and was ?grayish, pt still reports tingling to left foot area color is WNL, good cap refill noted.

## 2012-02-09 NOTE — ED Provider Notes (Signed)
History     CSN: [id number]  Arrival date & time 02/09/12  1211   First MD Initiated Contact with Patient 02/09/12 1318      Chief Complaint  Patient presents with  . Cyst    (Consider location/radiation/quality/duration/timing/severity/associated sxs/prior treatment) HPI Comments: Patient c/o "knot" to her left kneecap that she noticed on the day prior to ED arrival.  States the area is painful with walking and cretain movements.  She also c/o tingling sensation to her left foot and a grayish discoloration to her lateral left foot.  She states the tingling sensation is located just to the foot and seems to be intermittent and just noticed when she saw the "knot" to her knee..  Denies calf pain or tingling, swelling, redness or numbness. Also denies known injury  Patient is a 46 y.o. female presenting with knee pain. The history is provided by the patient.  Knee Pain This is a new problem. The current episode started yesterday. The problem occurs constantly. The problem has been unchanged. Associated symptoms include arthralgias. Pertinent negatives include no chills, fever, headaches, joint swelling, numbness, rash, swollen glands or weakness. The symptoms are aggravated by bending, standing and walking. She has tried nothing for the symptoms. The treatment provided no relief.    Past Medical History  Diagnosis Date  . Asthma   . COPD (chronic obstructive pulmonary disease)   . Hypertension   . GERD (gastroesophageal reflux disease)   . Anxiety disorder   . Chronic pain syndrome   . Anxiety   . Depression     Past Surgical History  Procedure Date  . Cholecystectomy   . Cesarean section   . Carpal tunnel release   . Esophagogastroduodenoscopy 07/31/2011    Procedure: ESOPHAGOGASTRODUODENOSCOPY (EGD);  Surgeon: Malissa Hippo, MD;  Location: [facility];  Service: Endoscopy;  Laterality: N/A;  Elease Hashimoto dilation 07/31/2011    Procedure: Elease Hashimoto DILATION;  Surgeon: Malissa Hippo, MD;  Location: [facility];  Service: Endoscopy;;    Family History  Problem Relation Age of Onset  . Asthma Mother   . Asthma Father   . Cancer Father     History  Substance Use Topics  . Smoking status: Former Smoker    Types: Cigarettes    Quit date: 10/25/2010  . Smokeless tobacco: Never Used  . Alcohol Use: 1.2 oz/week    2 Cans of beer per week    OB History    Grav Para Term Preterm Abortions TAB SAB Ect Mult Living                  Review of Systems  Constitutional: Negative for fever, chills and activity change.  Genitourinary: Negative for dysuria and difficulty urinating.  Musculoskeletal: Positive for arthralgias. Negative for joint swelling.  Skin: Negative for color change, rash and wound.  Neurological: Negative for weakness, numbness and headaches.       Tingling sensation to the left foot  All other systems reviewed and are negative.    Allergies  Review of patient's allergies indicates no known allergies.  Home Medications   Current Outpatient Rx  Name Route Sig Dispense Refill  . ACLIDINIUM BROMIDE 400 MCG/ACT IN AEPB Inhalation Inhale 1 puff into the lungs as needed. For shortness of breath    . ALBUTEROL SULFATE HFA 108 (90 BASE) MCG/ACT IN AERS Inhalation Inhale 2 puffs into the lungs every 6 (six) hours as needed for wheezing or shortness of breath. For shortness  of breath 1 Inhaler 1  . ALBUTEROL SULFATE (2.5 MG/3ML) 0.083% IN NEBU Nebulization Take 2.5 mg by nebulization every 6 (six) hours as needed. For shortness of breath    . ALBUTEROL SULFATE 2 MG PO TABS Oral Take 2 mg by mouth 4 (four) times daily.    Marland Kitchen AMLODIPINE BESYLATE 5 MG PO TABS Oral Take 5 mg by mouth daily.      . BUPROPION HCL ER (XL) 300 MG PO TB24 Oral Take 300 mg by mouth every morning.      Marland Kitchen DIAZEPAM 10 MG PO TABS Oral Take 10 mg by mouth daily as needed. For spasms/anxiety     . OMEGA-3 FATTY ACIDS 1000 MG PO CAPS Oral Take 2 g by mouth every morning.      Marland Kitchen  FLUTICASONE-SALMETEROL 250-50 MCG/DOSE IN AEPB Inhalation Inhale 1 puff into the lungs 2 (two) times daily. 60 each 0  . HYDROCODONE-ACETAMINOPHEN 10-325 MG PO TABS Oral Take 1 tablet by mouth every 6 (six) hours as needed. For pain     . LABETALOL HCL 200 MG PO TABS Oral Take 200 mg by mouth 2 (two) times daily.      Marland Kitchen LISINOPRIL 20 MG PO TABS Oral Take 1 tablet (20 mg total) by mouth daily. 30 tablet 0  . PANTOPRAZOLE SODIUM 40 MG PO TBEC Oral Take 1 tablet (40 mg total) by mouth 2 (two) times daily before a meal. 30 tablet 1  . PREDNISONE 20 MG PO TABS  Take 2 tablets every day for 4 days, then 1 tablet everyday for 4 days, then half tablet every day for 4 days, then STOP. 14 tablet 0    BP 141/91  Pulse 80  Temp 98.1 F (36.7 C)  Resp 16  SpO2 98%  Physical Exam  Nursing note and vitals reviewed. Constitutional: She is oriented to person, place, and time. She appears well-developed and well-nourished. No distress.  Cardiovascular: Normal rate, regular rhythm, normal heart sounds and intact distal pulses.   Pulmonary/Chest: Effort normal and breath sounds normal.  Musculoskeletal: She exhibits tenderness.       Left knee: She exhibits normal range of motion, no swelling, no ecchymosis, no deformity, no erythema, normal patellar mobility and no bony tenderness. tenderness found. Patellar tendon tenderness noted.       Legs:      Left foot: She exhibits normal range of motion, no tenderness, no bony tenderness, no swelling, normal capillary refill, no crepitus, no deformity and no laceration.       ttp of the left knee.  No erythema, bruising .  Left foot is warm and pink in color, no edema or discoloration noted.  Left calf is NT and w/o edema.  DP and PT pulses are brisk, distal sensation intact, CR , <2 sec  Neurological: She is alert and oriented to person, place, and time. No cranial nerve deficit or sensory deficit. She exhibits normal muscle tone. Coordination and gait normal.    Reflex Scores:      Patellar reflexes are 2+ on the right side and 2+ on the left side.      Achilles reflexes are 2+ on the right side and 2+ on the left side. Skin: Skin is warm and dry. No erythema.    ED Course  Procedures (including critical care time)  Labs Reviewed - No data to display Dg Knee Complete 4 Views Left  02/09/2012  *RADIOLOGY REPORT*  Clinical Data: Knot on the lateral patella area.  LEFT KNEE - COMPLETE 4+ VIEW  Comparison: 09/20/2011  Findings: Four views of the left knee are negative for acute fracture or dislocation.  No evidence for a joint effusion.  The alignment is within normal limits.  IMPRESSION: No acute findings.  Original Report Authenticated By: Richarda Overlie, M.D.        MDM    Patient presents with a firm, flesh-colored nodule lateral to the left patella.  The area is tender to palpation. No erythema, drainage, or fluctuance is present  to suggest abscess.  Possible ganglion cyst. Patient agrees to close followup with orthopedics.  She states she prefers to see Dr. Hilda Lias I will give her referral.,   Patient complains of discoloration to her lateral left foot with intermittent tingling sensation.  Examination of her left foot appears normal. The skin is warm and dry. DP and posterior tibial pulses are strong and brisk, distal sensation is intact, cap refill is less than 2 seconds. There is no calf pain or swelling to suggest DVT. I doubt vascular compromise.  The patient appears reasonably screened and/or stabilized for discharge and I doubt any other medical condition or other [facility] requiring further screening, evaluation, or treatment in the ED at this time prior to discharge.      Takeysha L. Kemoni Quesenberry, [place] 02/14/12 1702

## 2012-02-19 NOTE — ED Provider Notes (Signed)
Medical screening examination/treatment/procedure(s) were performed by non-physician practitioner and as supervising physician I was immediately available for consultation/collaboration.  Amma Crear, MD 02/19/12 1503 

## 2012-05-04 ENCOUNTER — Encounter (HOSPITAL_COMMUNITY): Payer: Self-pay | Admitting: *Deleted

## 2012-05-04 ENCOUNTER — Emergency Department (HOSPITAL_COMMUNITY): Payer: Medicaid Other

## 2012-05-04 ENCOUNTER — Other Ambulatory Visit: Payer: Self-pay

## 2012-05-04 ENCOUNTER — Emergency Department (HOSPITAL_COMMUNITY)
Admission: EM | Admit: 2012-05-04 | Discharge: 2012-05-04 | Disposition: A | Discharge status: Home / Self Care | Payer: Medicaid Other | Attending: Emergency Medicine | Admitting: Emergency Medicine

## 2012-05-04 DIAGNOSIS — K219 Gastro-esophageal reflux disease without esophagitis: Secondary | ICD-10-CM | POA: Insufficient documentation

## 2012-05-04 DIAGNOSIS — J4489 Other specified chronic obstructive pulmonary disease: Secondary | ICD-10-CM | POA: Insufficient documentation

## 2012-05-04 DIAGNOSIS — J45909 Unspecified asthma, uncomplicated: Secondary | ICD-10-CM | POA: Insufficient documentation

## 2012-05-04 DIAGNOSIS — J449 Chronic obstructive pulmonary disease, unspecified: Secondary | ICD-10-CM | POA: Insufficient documentation

## 2012-05-04 DIAGNOSIS — Z87891 Personal history of nicotine dependence: Secondary | ICD-10-CM | POA: Insufficient documentation

## 2012-05-04 DIAGNOSIS — G894 Chronic pain syndrome: Secondary | ICD-10-CM | POA: Insufficient documentation

## 2012-05-04 DIAGNOSIS — Z79899 Other long term (current) drug therapy: Secondary | ICD-10-CM | POA: Insufficient documentation

## 2012-05-04 DIAGNOSIS — Z8781 Personal history of (healed) traumatic fracture: Secondary | ICD-10-CM | POA: Insufficient documentation

## 2012-05-04 DIAGNOSIS — R0789 Other chest pain: Secondary | ICD-10-CM

## 2012-05-04 DIAGNOSIS — I10 Essential (primary) hypertension: Secondary | ICD-10-CM | POA: Insufficient documentation

## 2012-05-04 DIAGNOSIS — Z8659 Personal history of other mental and behavioral disorders: Secondary | ICD-10-CM | POA: Insufficient documentation

## 2012-05-04 LAB — CBC WITH DIFFERENTIAL/PLATELET
Eosinophils Absolute: 0.3 10*3/uL (ref 0.0–0.7)
Eosinophils Relative: 5 % (ref 0–5)
Lymphs Abs: 1.8 10*3/uL (ref 0.7–4.0)
MCH: 33.2 pg (ref 26.0–34.0)
MCHC: 33.9 g/dL (ref 30.0–36.0)
MCV: 97.7 fL (ref 78.0–100.0)
Platelets: 228 10*3/uL (ref 150–400)
RBC: 3.89 MIL/uL (ref 3.87–5.11)

## 2012-05-04 LAB — COMPREHENSIVE METABOLIC PANEL
BUN: 12 mg/dL (ref 6–23)
Calcium: 9.2 mg/dL (ref 8.4–10.5)
GFR calc Af Amer: >90 mL/min (ref >90)
Glucose, Bld: 109 mg/dL — ABNORMAL HIGH (ref 70–99)
Sodium: 134 mEq/L — ABNORMAL LOW (ref 135–145)
Total Protein: 7.2 g/dL (ref 6.0–8.3)

## 2012-05-04 LAB — TROPONIN I: Troponin I: <0.3 ng/mL (ref <0.30)
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
## 2012-05-04 NOTE — ED Notes (Addendum)
Intermittent sharp pain to chest.  Increasing intensity,No cough or cold sx  Pt points to sternal area as site of pain.No pain at present.

## 2012-05-13 ENCOUNTER — Encounter (HOSPITAL_COMMUNITY): Payer: Self-pay | Admitting: Pharmacy Technician

## 2012-05-14 ENCOUNTER — Encounter (HOSPITAL_COMMUNITY): Payer: Self-pay | Admitting: *Deleted

## 2012-05-14 ENCOUNTER — Emergency Department (HOSPITAL_COMMUNITY)
Admission: EM | Admit: 2012-05-14 | Discharge: 2012-05-14 | Disposition: A | Discharge status: Home / Self Care | Payer: Medicaid Other | Attending: Emergency Medicine | Admitting: Emergency Medicine

## 2012-05-14 ENCOUNTER — Other Ambulatory Visit (HOSPITAL_COMMUNITY): Payer: Self-pay | Admitting: Orthopaedic Surgery

## 2012-05-14 ENCOUNTER — Emergency Department (HOSPITAL_COMMUNITY): Payer: Medicaid Other

## 2012-05-14 DIAGNOSIS — G894 Chronic pain syndrome: Secondary | ICD-10-CM | POA: Insufficient documentation

## 2012-05-14 DIAGNOSIS — I10 Essential (primary) hypertension: Secondary | ICD-10-CM | POA: Insufficient documentation

## 2012-05-14 DIAGNOSIS — F329 Major depressive disorder, single episode, unspecified: Secondary | ICD-10-CM | POA: Insufficient documentation

## 2012-05-14 DIAGNOSIS — K219 Gastro-esophageal reflux disease without esophagitis: Secondary | ICD-10-CM | POA: Insufficient documentation

## 2012-05-14 DIAGNOSIS — J4489 Other specified chronic obstructive pulmonary disease: Secondary | ICD-10-CM | POA: Insufficient documentation

## 2012-05-14 DIAGNOSIS — E876 Hypokalemia: Secondary | ICD-10-CM | POA: Insufficient documentation

## 2012-05-14 DIAGNOSIS — R079 Chest pain, unspecified: Secondary | ICD-10-CM | POA: Insufficient documentation

## 2012-05-14 DIAGNOSIS — F411 Generalized anxiety disorder: Secondary | ICD-10-CM | POA: Insufficient documentation

## 2012-05-14 DIAGNOSIS — J449 Chronic obstructive pulmonary disease, unspecified: Secondary | ICD-10-CM | POA: Insufficient documentation

## 2012-05-14 DIAGNOSIS — Z79899 Other long term (current) drug therapy: Secondary | ICD-10-CM | POA: Insufficient documentation

## 2012-05-14 DIAGNOSIS — F3289 Other specified depressive episodes: Secondary | ICD-10-CM | POA: Insufficient documentation

## 2012-05-14 DIAGNOSIS — Z87891 Personal history of nicotine dependence: Secondary | ICD-10-CM | POA: Insufficient documentation

## 2012-05-14 LAB — CBC WITH DIFFERENTIAL/PLATELET
Hemoglobin: 14 g/dL (ref 12.0–15.0)
Lymphs Abs: 0.9 10*3/uL (ref 0.7–4.0)
Monocytes Relative: 9 % (ref 3–12)
Neutro Abs: 2.6 10*3/uL (ref 1.7–7.7)
Neutrophils Relative %: 63 % (ref 43–77)
RBC: 4.23 MIL/uL (ref 3.87–5.11)

## 2012-05-14 LAB — BASIC METABOLIC PANEL
BUN: 5 mg/dL — ABNORMAL LOW (ref 6–23)
Chloride: 100 mEq/L (ref 96–112)
GFR calc Af Amer: >90 mL/min (ref >90)
Glucose, Bld: 100 mg/dL — ABNORMAL HIGH (ref 70–99)
Potassium: 3.1 mEq/L — ABNORMAL LOW (ref 3.5–5.1)

## 2012-05-14 MED ORDER — POTASSIUM CHLORIDE CRYS ER 20 MEQ PO TBCR
40.0000 meq | EXTENDED_RELEASE_TABLET | Freq: Once | ORAL | Status: AC
  Administered 2012-05-14: 40 meq via ORAL
  Filled 2012-05-14: qty 2

## 2012-05-14 MED ORDER — POTASSIUM CHLORIDE ER 10 MEQ PO TBCR: 10.0000 meq | EXTENDED_RELEASE_TABLET | Freq: Two times a day (BID) | ORAL | Status: DC

## 2012-05-14 MED ORDER — MORPHINE SULFATE 4 MG/ML IJ SOLN
4.0000 mg | Freq: Once | INTRAMUSCULAR | Status: AC
  Administered 2012-05-14: 4 mg via INTRAVENOUS
  Filled 2012-05-14: qty 1

## 2012-05-14 MED ORDER — SODIUM CHLORIDE 0.9 % IV SOLN
Freq: Once | INTRAVENOUS | Status: AC
Start: 2012-05-14 — End: 2012-05-14
  Administered 2012-05-14: 09:00:00 via INTRAVENOUS

## 2012-05-14 MED ORDER — CYCLOBENZAPRINE HCL 10 MG PO TABS: 10.0000 mg | ORAL_TABLET | Freq: Three times a day (TID) | ORAL | Status: DC | PRN

## 2012-05-14 MED ORDER — ONDANSETRON HCL 4 MG/2ML IJ SOLN
4.0000 mg | Freq: Once | INTRAMUSCULAR | Status: AC
  Administered 2012-05-14: 4 mg via INTRAVENOUS
  Filled 2012-05-14: qty 2

## 2012-05-14 MED ORDER — MORPHINE SULFATE 4 MG/ML IJ SOLN
2.0000 mg | Freq: Once | INTRAMUSCULAR | Status: AC
  Administered 2012-05-14: 2 mg via INTRAVENOUS
  Filled 2012-05-14: qty 1

## 2012-05-14 NOTE — ED Provider Notes (Signed)
History     CSN: [id number]  Arrival date & time 05/14/12  0806   First MD Initiated Contact with Patient 05/14/12 0813      Chief Complaint  Patient presents with  . Chest Pain    (Consider location/radiation/quality/duration/timing/severity/associated sxs/prior treatment) HPI Comments: Patient c/o sharp, stabbing type chest pain for more than one week.  States that she was seen here one week ago and treated for same.  States the pains are same as previous, but are now more frequent and she also c/o "fluttering" sensations.  States the pains are located to the left of her sternum and underneath the left breast.  She noticed the pain was worse last evening when she lying on her side.  She also states she has shortness of breath, but also has hx of asthma and COPD and states the shortness of breath is "no more than ususal".  She denies fever, sweats, vomiting, numbness, or cough.    Patient states she was also seen by her PMD yesterday and advised to "double up" on her blood pressure medications and started back on albuterol tablets.    Patient is a 46 y.o. female presenting with chest pain. The history is provided by the patient.  Chest Pain The chest pain began 1 - 2 weeks ago. Chest pain occurs frequently. The chest pain is worsening. Associated with: certain movements. The severity of the pain is moderate. The quality of the pain is described as sharp and stabbing. The pain does not radiate. Chest pain is worsened by certain positions. Primary symptoms include wheezing and palpitations. Pertinent negatives for primary symptoms include no fever, no fatigue, no syncope, no shortness of breath, no cough, no abdominal pain, no nausea, no vomiting, no dizziness and no altered mental status.  The palpitations began yesterday. The onset of the palpitations was sudden. An episode of palpitations lasts for less than 5 minutes. The palpitations have occurred 6 to 10 time(s). The palpitations occur  under no particular circumstance. The palpitations did not occur with syncope, dizziness or shortness of breath.   Pertinent negatives for associated symptoms include no diaphoresis, no lower extremity edema, no near-syncope, no numbness, no orthopnea and no weakness. She tried beta-agonist inhalers, antacids and narcotics for the symptoms.  Her past medical history is significant for anxiety/panic attacks, COPD and hypertension.  Pertinent negatives for past medical history include no aortic aneurysm, no CAD and no PE.     Past Medical History  Diagnosis Date  . Asthma   . COPD (chronic obstructive pulmonary disease)   . Hypertension   . GERD (gastroesophageal reflux disease)   . Anxiety disorder   . Chronic pain syndrome   . Anxiety   . Depression     Past Surgical History  Procedure Date  . Cholecystectomy   . Cesarean section   . Carpal tunnel release   . Esophagogastroduodenoscopy 07/31/2011    Procedure: ESOPHAGOGASTRODUODENOSCOPY (EGD);  Surgeon: Malissa Hippo, MD;  Location: [facility];  Service: Endoscopy;  Laterality: N/A;  Elease Hashimoto dilation 07/31/2011    Procedure: Elease Hashimoto DILATION;  Surgeon: Malissa Hippo, MD;  Location: [facility];  Service: Endoscopy;;  . Endometrial ablation     Family History  Problem Relation Age of Onset  . Asthma Mother   . Asthma Father   . Cancer Father     History  Substance Use Topics  . Smoking status: Former Smoker    Types: Cigarettes    Quit date:  10/25/2010  . Smokeless tobacco: Never Used  . Alcohol Use: 1.2 oz/week    2 Cans of beer per week    OB History    Grav Para Term Preterm Abortions TAB SAB Ect Mult Living                  Review of Systems  Constitutional: Negative for fever, diaphoresis, activity change, appetite change and fatigue.  HENT: Negative for facial swelling and neck pain.   Eyes: Negative for visual disturbance.  Respiratory: Positive for wheezing. Negative for cough, chest  tightness and shortness of breath.   Cardiovascular: Positive for chest pain and palpitations. Negative for orthopnea, leg swelling, syncope and near-syncope.  Gastrointestinal: Negative for nausea, vomiting and abdominal pain.  Genitourinary: Negative for flank pain.  Musculoskeletal: Negative for arthralgias.  Skin: Negative for color change and rash.  Neurological: Negative for dizziness, syncope, weakness and numbness.  Psychiatric/Behavioral: Negative for altered mental status.  All other systems reviewed and are negative.    Allergies  Review of patient's allergies indicates no known allergies.  Home Medications   Current Outpatient Rx  Name Route Sig Dispense Refill  . ACLIDINIUM BROMIDE 400 MCG/ACT IN AEPB Inhalation Inhale 1 puff into the lungs as needed. For shortness of breath    . ALBUTEROL SULFATE HFA 108 (90 BASE) MCG/ACT IN AERS Inhalation Inhale 2 puffs into the lungs every 6 (six) hours as needed for wheezing or shortness of breath. For shortness of breath 1 Inhaler 1  . ALBUTEROL SULFATE (2.5 MG/3ML) 0.083% IN NEBU Nebulization Take 2.5 mg by nebulization every 6 (six) hours as needed. For shortness of breath    . ALBUTEROL SULFATE 4 MG PO TABS Oral Take 4 mg by mouth 3 (three) times daily.    Marland Kitchen AMLODIPINE BESYLATE 5 MG PO TABS Oral Take 5 mg by mouth daily.      . BUPROPION HCL ER (XL) 300 MG PO TB24 Oral Take 300 mg by mouth every morning.      Marland Kitchen DIAZEPAM 10 MG PO TABS Oral Take 10 mg by mouth daily as needed. For spasms/anxiety     . OMEGA-3 FATTY ACIDS 1000 MG PO CAPS Oral Take 2 g by mouth every morning.      Marland Kitchen LABETALOL HCL 200 MG PO TABS Oral Take 200 mg by mouth 2 (two) times daily.      Marland Kitchen LISINOPRIL 20 MG PO TABS Oral Take 1 tablet (20 mg total) by mouth daily. 30 tablet 0  . ULTRA SLIM QUICK PO Oral Take 2 tablets by mouth daily.    . OXYCODONE-ACETAMINOPHEN 5-325 MG PO TABS Oral Take 1 tablet by mouth every 4 (four) hours as needed. Pain.    Marland Kitchen PANTOPRAZOLE  SODIUM 40 MG PO TBEC Oral Take 1 tablet (40 mg total) by mouth 2 (two) times daily before a meal. 30 tablet 1  . PRESCRIPTION MEDICATION  Steroid Injection in the Hip given by Dr. Sherwood Gambler.      BP 139/87  Pulse 79  Temp 97.9 F (36.6 C) (Oral)  Resp 16  Ht 5\' 4"  (1.626 m)  Wt 153 lb (69.4 kg)  BMI 26.26 kg/m2  SpO2 100%  Physical Exam  Nursing note and vitals reviewed. Constitutional: She is oriented to person, place, and time. She appears well-developed and well-nourished. No distress.  HENT:  Head: Normocephalic and atraumatic.  Mouth/Throat: Oropharynx is clear and moist.  Eyes: EOM are normal. Pupils are equal, round, and reactive to  light.  Neck: Normal range of motion. Neck supple. No thyromegaly present.  Cardiovascular: Normal rate, regular rhythm, normal heart sounds and intact distal pulses.   No murmur heard. Pulmonary/Chest: Effort normal. No respiratory distress. She has wheezes. She has no rales. She exhibits no tenderness.  Abdominal: Soft. She exhibits no distension. There is no tenderness. There is no rebound and no guarding.  Musculoskeletal: Normal range of motion. She exhibits no edema.  Lymphadenopathy:    She has no cervical adenopathy.  Neurological: She is alert and oriented to person, place, and time. She exhibits normal muscle tone. Coordination normal.  Skin: Skin is warm and dry.    ED Course  Procedures (including critical care time)   Results for orders placed during the hospital encounter of 05/14/12  CBC WITH DIFFERENTIAL      Component Value Range   WBC 4.2  4.0 - 10.5 K/uL   RBC 4.23  3.87 - 5.11 MIL/uL   Hemoglobin 14.0  12.0 - 15.0 g/dL   HCT 82.9  56.2 - 13.0 %   MCV 97.2  78.0 - 100.0 fL   MCH 33.1  26.0 - 34.0 pg   MCHC 34.1  30.0 - 36.0 g/dL   RDW 86.5  78.4 - 69.6 %   Platelets 259  150 - 400 K/uL   Neutrophils Relative 63  43 - 77 %   Neutro Abs 2.6  1.7 - 7.7 K/uL   Lymphocytes Relative 22  12 - 46 %   Lymphs Abs 0.9  0.7 -  4.0 K/uL   Monocytes Relative 9  3 - 12 %   Monocytes Absolute 0.4  0.1 - 1.0 K/uL   Eosinophils Relative 5  0 - 5 %   Eosinophils Absolute 0.2  0.0 - 0.7 K/uL   Basophils Relative 1  0 - 1 %   Basophils Absolute 0.1  0.0 - 0.1 K/uL  BASIC METABOLIC PANEL      Component Value Range   Sodium 136  135 - 145 mEq/L   Potassium 3.1 (*) 3.5 - 5.1 mEq/L   Chloride 100  96 - 112 mEq/L   CO2 26  19 - 32 mEq/L   Glucose, Bld 100 (*) 70 - 99 mg/dL   BUN 5 (*) 6 - 23 mg/dL   Creatinine, Ser 2.95  0.50 - 1.10 mg/dL   Calcium 9.8  8.4 - 28.4 mg/dL   GFR calc non Af Amer >90  >90 mL/min   GFR calc Af Amer >90  >90 mL/min  TROPONIN I      Component Value Range   Troponin I <0.30  <0.30 ng/mL     Dg Chest Portable 1 View  05/14/2012  *RADIOLOGY REPORT*  Clinical Data: Chest pain  CHEST - 1 VIEW  Comparison:  05/04/2012  Findings: The heart size and mediastinal contours are within normal limits.  Both lungs are clear.  IMPRESSION: No active disease.   Original Report Authenticated By: Judie Petit. Miles Costain, M.D.      MDM    ED ECG REPORT    Date: 05/14/2012  Rate: 78  Rhythm: normal sinus rhythm  QRS Axis: normal  Intervals: normal  ST/T Wave abnormalities: normal  Conduction Disutrbances:none  Narrative Interpretation:   Old EKG Reviewed: unchanged, normal ECG from 05/04/12     ECG read by Dr. Deretha Emory        Previous ED chart was reviewed by me.  Pt seen here 05/04/12 for same.  Patient is feeling better after IVF's, morphine, anti-emetic and oral potassium.  Chest pains are likely related to muscle spasms.  Doubt cardiac process or PE.  Hypokalemia treated with po potassium and will prescribe oral potassium for few days.  Pt agrees to f/u with her PMD to have K+ rechecked.     Pt hx and results discussed with EDP and pt prior to pt d/c.      Mariachristina L. [place], [place] 05/15/12 2106

## 2012-05-14 NOTE — ED Notes (Signed)
Chest pain x several days and seen here previously a few days ago for the same. Pain describe as sharp, pulsating, constant pain since yesterday. Pt states palpitations last night.

## 2012-05-15 ENCOUNTER — Encounter (HOSPITAL_COMMUNITY)
Admission: RE | Admit: 2012-05-15 | Discharge: 2012-05-15 | Disposition: A | Discharge status: Home / Self Care | Payer: Medicaid Other | Source: Ambulatory Visit | Attending: Orthopaedic Surgery | Admitting: Orthopaedic Surgery

## 2012-05-15 ENCOUNTER — Encounter (HOSPITAL_COMMUNITY): Payer: Self-pay

## 2012-05-15 HISTORY — DX: Fibromyalgia: M79.7

## 2012-05-15 LAB — COMPREHENSIVE METABOLIC PANEL
AST: 42 U/L — ABNORMAL HIGH (ref 0–37)
Albumin: 3.8 g/dL (ref 3.5–5.2)
Calcium: 9.6 mg/dL (ref 8.4–10.5)
Creatinine, Ser: 0.73 mg/dL (ref 0.50–1.10)
GFR calc non Af Amer: >90 mL/min (ref >90)
Sodium: 139 mEq/L (ref 135–145)
Total Protein: 7.1 g/dL (ref 6.0–8.3)

## 2012-05-15 LAB — CBC WITH DIFFERENTIAL/PLATELET
Basophils Absolute: 0.1 10*3/uL (ref 0.0–0.1)
Basophils Relative: 1 % (ref 0–1)
Eosinophils Absolute: 0.1 10*3/uL (ref 0.0–0.7)
Eosinophils Relative: 3 % (ref 0–5)
HCT: 39.9 % (ref 36.0–46.0)
MCHC: 33.3 g/dL (ref 30.0–36.0)
MCV: 99 fL (ref 78.0–100.0)
Monocytes Absolute: 0.4 10*3/uL (ref 0.1–1.0)
Platelets: 248 10*3/uL (ref 150–400)
RDW: 12.9 % (ref 11.5–15.5)

## 2012-05-15 LAB — SURGICAL PCR SCREEN: MRSA, PCR: NEGATIVE

## 2012-05-15 NOTE — Patient Instructions (Addendum)
20 [name]  05/15/2012   Your procedure is scheduled on:  05/20/12  Report to [facility] at 06:15 AM.  Call this number if you have problems the morning of surgery: [phone]   Remember:   Do not eat or drink:After Midnight.  Take these medicines the morning of surgery with A SIP OF WATER: Amlodipine, Lisinopril, Labetalol, Pantoprazole, Buproprion, and Albuterol. Use your Diazepam only if needed. Also, use your Albuterol inhaler/nebulizer before coming in.    Do not wear jewelry, make-up or nail polish.  Do not wear lotions, powders, or perfumes.   Do not shave 48 hours prior to surgery. Men may shave face and neck.  Do not bring valuables to the hospital.  Contacts, dentures or bridgework may not be worn into surgery.  Leave suitcase in the car. After surgery it may be brought to your room.  For patients admitted to the hospital, checkout time is 11:00 AM the day of discharge.   Patients discharged the day of surgery will not be allowed to drive home.   Special Instructions: Shower using CHG 2 nights before surgery and the night before surgery.  If you shower the day of surgery use CHG.  Use special wash - you have one bottle of CHG for all showers.  You should use approximately 1/3 of the bottle for each shower.   Please read over the following fact sheets that you were given: Pain Booklet, MRSA Information, Surgical Site Infection Prevention, Anesthesia Post-op Instructions and Care and Recovery After Surgery    Ganglion A ganglion is a swelling under the skin that is filled with a thick, jelly-like substance. It is a synovial cyst. This is caused by a break (rupture) of the joint lining from the joint space. A ganglion often occurs near an area of repeated minor trauma (damage caused by an accident). Trauma may also be a repetitive movement at work or in a sport. TREATMENT  It often goes away without treatment. It may reappear later. Sometimes a ganglion may need to be  surgically removed. Often they are drained and injected with a steroid. Sometimes they respond to:  Rest.  Splinting. HOME CARE INSTRUCTIONS   Your caregiver will decide the best way of treating your ganglion. Do not try to break the ganglion yourself by pressing on it, poking it with a needle, or hitting it with a heavy object.  Use medications as directed. SEEK MEDICAL CARE IF:   The ganglion becomes larger or more painful.  You have increased redness or swelling.  You have weakness or numbness in your hand or wrist. MAKE SURE YOU:   Understand these instructions.  Will watch your condition.  Will get help right away if you are not doing well or get worse. Document Released: 06/28/2000 Document Revised: 09/23/2011 Document Reviewed: 08/25/2007 [facility] Patient Information 2013 [place], [place].    PATIENT INSTRUCTIONS POST-ANESTHESIA  IMMEDIATELY FOLLOWING SURGERY:  Do not drive or operate machinery for the first twenty four hours after surgery.  Do not make any important decisions for twenty four hours after surgery or while taking narcotic pain medications or sedatives.  If you develop intractable nausea and vomiting or a severe headache please notify your doctor immediately.  FOLLOW-UP:  Please make an appointment with your surgeon as instructed. You do not need to follow up with anesthesia unless specifically instructed to do so.  WOUND CARE INSTRUCTIONS (if applicable):  Keep a dry clean dressing on the anesthesia/puncture wound site if there is  drainage.  Once the wound has quit draining you may leave it open to air.  Generally you should leave the bandage intact for twenty four hours unless there is drainage.  If the epidural site drains for more than 36-48 hours please call the anesthesia department.  QUESTIONS?:  Please feel free to call your physician or the hospital operator if you have any questions, and they will be happy to assist you.

## 2012-05-16 NOTE — ED Provider Notes (Signed)
Medical screening examination/treatment/procedure(s) were performed by non-physician practitioner and as supervising physician I was immediately available for consultation/collaboration.   Shelda Jakes, MD 05/16/12 2158

## 2012-05-19 NOTE — H&P (Signed)
[name] is an 46 y.o. female.   Chief Complaint: Ganglion cyst lateral anterior knee on left HPI: She has had a ganglion cyst, lobulated, about four months or longer on the left knee laterally.  It has gradually gotten larger.  It is painful with bending the knee.  She has no redness. She has no trauma.  She would like to have it removed.  I have told her it could recur.  Past Medical History  Diagnosis Date  . Asthma   . COPD (chronic obstructive pulmonary disease)   . Hypertension   . GERD (gastroesophageal reflux disease)   . Anxiety disorder   . Chronic pain syndrome   . Anxiety   . Depression   . Fibromyalgia     Past Surgical History  Procedure Date  . Cholecystectomy   . Cesarean section   . Carpal tunnel release   . Esophagogastroduodenoscopy 07/31/2011    Procedure: ESOPHAGOGASTRODUODENOSCOPY (EGD);  Surgeon: Malissa Hippo, MD;  Location: [facility];  Service: Endoscopy;  Laterality: N/A;  Elease Hashimoto dilation 07/31/2011    Procedure: Elease Hashimoto DILATION;  Surgeon: Malissa Hippo, MD;  Location: [facility];  Service: Endoscopy;;  . Endometrial ablation     Family History  Problem Relation Age of Onset  . Asthma Mother   . Asthma Father   . Cancer Father    Social History:  reports that she quit smoking about 18 months ago. Her smoking use included Cigarettes. She has never used smokeless tobacco. She reports that she drinks about 1.2 ounces of alcohol per week. She reports that she does not use illicit drugs.  Allergies: No Known Allergies  No prescriptions prior to admission    No results found for this or any previous visit (from the past 48 hour(s)). No results found.  Review of Systems  Constitutional: Negative.   HENT: Negative.   Eyes: Negative.   Respiratory: Positive for shortness of breath and wheezing.   Cardiovascular:       Hypertension  Gastrointestinal: Positive for heartburn.       Gerd  Genitourinary: Negative.     Musculoskeletal: Positive for joint pain (Pain left knee laterally with small ganglion cyst for about four months.  It has gotten larger and painful when squatting.).  Skin: Negative.   Neurological: Negative.   Endo/Heme/Allergies: Negative.   Psychiatric/Behavioral: Negative.   All other systems reviewed and are negative.    There were no vitals taken for this visit. Physical Exam  Constitutional: She is oriented to person, place, and time. She appears well-developed and well-nourished.  HENT:  Head: Normocephalic and atraumatic.  Eyes: Conjunctivae normal and EOM are normal. Pupils are equal, round, and reactive to light.  Neck: Normal range of motion. Neck supple.  Cardiovascular: Normal rate, regular rhythm, normal heart sounds and intact distal pulses.   Respiratory: Effort normal and breath sounds normal.  GI: Bowel sounds are normal.  Musculoskeletal: She exhibits tenderness (Pain left knee just lateral to inferior area of patella with ganglion cyst lobulated present.  Lower part size of small grape.).       Left knee: She exhibits swelling.       Legs: Neurological: She is alert and oriented to person, place, and time. She has normal reflexes.  Skin: Skin is warm and dry.  Psychiatric: She has a normal mood and affect. Judgment normal.     Assessment/Plan Ganglion cyst left knee anterior and just lateral to the patella inferiorly.  For excision in OR.  Risks and imponderables have been discussed.  She agrees to procedure as an outpatient.  Tekisha Darcey 05/19/2012, 3:52 PM

## 2012-05-19 NOTE — OR Nursing (Signed)
Spoke with Stanton Kidney at Dr. Jenetta Downer office to remind him that we need H&P in am for 0730 surgery

## 2012-05-20 ENCOUNTER — Ambulatory Visit (HOSPITAL_COMMUNITY)
Admission: RE | Admit: 2012-05-20 | Discharge: 2012-05-20 | Disposition: A | Discharge status: Home / Self Care | Payer: Medicaid Other | Source: Ambulatory Visit | Attending: Orthopaedic Surgery | Admitting: Orthopaedic Surgery

## 2012-05-20 ENCOUNTER — Encounter (HOSPITAL_COMMUNITY)
Admission: RE | Disposition: A | Discharge status: Home / Self Care | Payer: Self-pay | Source: Ambulatory Visit | Attending: Orthopaedic Surgery

## 2012-05-20 ENCOUNTER — Encounter (HOSPITAL_COMMUNITY): Payer: Self-pay | Admitting: Anesthesiology

## 2012-05-20 ENCOUNTER — Encounter (HOSPITAL_COMMUNITY): Payer: Self-pay | Admitting: *Deleted

## 2012-05-20 ENCOUNTER — Ambulatory Visit (HOSPITAL_COMMUNITY): Payer: Medicaid Other | Admitting: Anesthesiology

## 2012-05-20 DIAGNOSIS — M629 Disorder of muscle, unspecified: Secondary | ICD-10-CM | POA: Insufficient documentation

## 2012-05-20 DIAGNOSIS — Z01812 Encounter for preprocedural laboratory examination: Secondary | ICD-10-CM | POA: Insufficient documentation

## 2012-05-20 DIAGNOSIS — J4489 Other specified chronic obstructive pulmonary disease: Secondary | ICD-10-CM | POA: Insufficient documentation

## 2012-05-20 DIAGNOSIS — I10 Essential (primary) hypertension: Secondary | ICD-10-CM | POA: Insufficient documentation

## 2012-05-20 DIAGNOSIS — M2420 Disorder of ligament, unspecified site: Secondary | ICD-10-CM | POA: Insufficient documentation

## 2012-05-20 DIAGNOSIS — J449 Chronic obstructive pulmonary disease, unspecified: Secondary | ICD-10-CM | POA: Insufficient documentation

## 2012-05-20 HISTORY — PX: MASS EXCISION: SHX2000

## 2012-05-20 LAB — URINE MICROSCOPIC-ADD ON

## 2012-05-20 LAB — URINALYSIS, ROUTINE W REFLEX MICROSCOPIC
Specific Gravity, Urine: >1.03 — ABNORMAL HIGH (ref 1.005–1.030)
Urobilinogen, UA: 0.2 mg/dL (ref 0.0–1.0)
pH: 5.5 (ref 5.0–8.0)

## 2012-05-20 SURGERY — EXCISION MASS
Anesthesia: Spinal | Site: Knee | Laterality: Left | Wound class: Clean

## 2012-05-20 MED ORDER — ONDANSETRON HCL 4 MG/2ML IJ SOLN
4.0000 mg | Freq: Once | INTRAMUSCULAR | Status: AC | PRN
  Administered 2012-05-20: 4 mg via INTRAVENOUS

## 2012-05-20 MED ORDER — FENTANYL CITRATE 0.05 MG/ML IJ SOLN
INTRAMUSCULAR | Status: AC
  Filled 2012-05-20: qty 2

## 2012-05-20 MED ORDER — FENTANYL CITRATE 0.05 MG/ML IJ SOLN
INTRAMUSCULAR | Status: DC | PRN
  Administered 2012-05-20 (×2): 50 ug via INTRAVENOUS

## 2012-05-20 MED ORDER — LIDOCAINE IN DEXTROSE 5-7.5 % IV SOLN
INTRAVENOUS | Status: DC | PRN
  Administered 2012-05-20: 75 mg via INTRATHECAL

## 2012-05-20 MED ORDER — FENTANYL CITRATE 0.05 MG/ML IJ SOLN
25.0000 ug | INTRAMUSCULAR | Status: DC | PRN
  Administered 2012-05-20 (×5): 50 ug via INTRAVENOUS

## 2012-05-20 MED ORDER — GLYCOPYRROLATE 0.2 MG/ML IJ SOLN
0.2000 mg | Freq: Once | INTRAMUSCULAR | Status: AC
  Administered 2012-05-20: 0.2 mg via INTRAVENOUS

## 2012-05-20 MED ORDER — PROPOFOL 10 MG/ML IV EMUL
INTRAVENOUS | Status: AC
  Filled 2012-05-20: qty 20

## 2012-05-20 MED ORDER — GLYCOPYRROLATE 0.2 MG/ML IJ SOLN
INTRAMUSCULAR | Status: AC
  Filled 2012-05-20: qty 1

## 2012-05-20 MED ORDER — SODIUM CHLORIDE 0.9 % IN NEBU
INHALATION_SOLUTION | RESPIRATORY_TRACT | Status: AC
  Filled 2012-05-20: qty 3

## 2012-05-20 MED ORDER — MIDAZOLAM HCL 2 MG/2ML IJ SOLN
INTRAMUSCULAR | Status: AC
  Filled 2012-05-20: qty 2

## 2012-05-20 MED ORDER — PROPOFOL INFUSION 10 MG/ML OPTIME
INTRAVENOUS | Status: DC | PRN
  Administered 2012-05-20: 100 ug/kg/min via INTRAVENOUS

## 2012-05-20 MED ORDER — 0.9 % SODIUM CHLORIDE (POUR BTL) OPTIME
TOPICAL | Status: DC | PRN
  Administered 2012-05-20: 1000 mL

## 2012-05-20 MED ORDER — LIDOCAINE HCL (PF) 1 % IJ SOLN
INTRAMUSCULAR | Status: AC
  Filled 2012-05-20: qty 5

## 2012-05-20 MED ORDER — LACTATED RINGERS IV SOLN
INTRAVENOUS | Status: DC
  Administered 2012-05-20: 1000 mL via INTRAVENOUS

## 2012-05-20 MED ORDER — LIDOCAINE IN DEXTROSE 5-7.5 % IV SOLN
INTRAVENOUS | Status: AC
  Filled 2012-05-20: qty 2

## 2012-05-20 MED ORDER — ONDANSETRON HCL 4 MG/2ML IJ SOLN
INTRAMUSCULAR | Status: AC
  Filled 2012-05-20: qty 2

## 2012-05-20 MED ORDER — FENTANYL CITRATE 0.05 MG/ML IJ SOLN: 50.0000 ug | Freq: Once | INTRAMUSCULAR | Status: DC

## 2012-05-20 MED ORDER — ALBUTEROL SULFATE (5 MG/ML) 0.5% IN NEBU
2.5000 mg | INHALATION_SOLUTION | Freq: Once | RESPIRATORY_TRACT | Status: AC
  Administered 2012-05-20: 2.5 mg via RESPIRATORY_TRACT

## 2012-05-20 MED ORDER — LIDOCAINE HCL (CARDIAC) 10 MG/ML IV SOLN
INTRAVENOUS | Status: DC | PRN
  Administered 2012-05-20: 30 mg via INTRAVENOUS

## 2012-05-20 MED ORDER — ALBUTEROL SULFATE (5 MG/ML) 0.5% IN NEBU
INHALATION_SOLUTION | RESPIRATORY_TRACT | Status: AC
  Filled 2012-05-20: qty 0.5

## 2012-05-20 MED ORDER — MIDAZOLAM HCL 2 MG/2ML IJ SOLN
1.0000 mg | INTRAMUSCULAR | Status: DC | PRN
  Administered 2012-05-20: 2 mg via INTRAVENOUS

## 2012-05-20 MED ORDER — MIDAZOLAM HCL 5 MG/5ML IJ SOLN
INTRAMUSCULAR | Status: DC | PRN
  Administered 2012-05-20 (×2): 1 mg via INTRAVENOUS

## 2012-05-20 SURGICAL SUPPLY — 31 items
BAG HAMPER (MISCELLANEOUS) ×2 IMPLANT
BANDAGE ELASTIC 6 VELCRO NS (GAUZE/BANDAGES/DRESSINGS) ×1 IMPLANT
BANDAGE ESMARK 4X12 BL STRL LF (DISPOSABLE) IMPLANT
BLADE SURG 15 STRL LF DISP TIS (BLADE) IMPLANT
BLADE SURG 15 STRL SS (BLADE) ×2
BNDG CMPR 12X4 ELC STRL LF (DISPOSABLE) ×1
BNDG CMPR 82X61 PLY HI ABS (GAUZE/BANDAGES/DRESSINGS) ×1
BNDG CONFORM 6X.82 1P STRL (GAUZE/BANDAGES/DRESSINGS) ×1 IMPLANT
BNDG ESMARK 4X12 BLUE STRL LF (DISPOSABLE) ×2
CLOTH BEACON ORANGE TIMEOUT ST (SAFETY) ×2 IMPLANT
COVER LIGHT HANDLE STERIS (MISCELLANEOUS) ×4 IMPLANT
DRSG XEROFORM 1X8 (GAUZE/BANDAGES/DRESSINGS) ×1 IMPLANT
DURAPREP 26ML APPLICATOR (WOUND CARE) ×2 IMPLANT
FORMALIN 10 PREFIL 480ML (MISCELLANEOUS) ×1 IMPLANT
GLOVE BIO SURGEON STRL SZ8 (GLOVE) ×2 IMPLANT
GLOVE BIO SURGEON STRL SZ8.5 (GLOVE) ×2 IMPLANT
GLOVE ECLIPSE 6.5 STRL STRAW (GLOVE) ×1 IMPLANT
GLOVE EXAM NITRILE MD LF STRL (GLOVE) ×1 IMPLANT
GLOVE INDICATOR 7.0 STRL GRN (GLOVE) ×1 IMPLANT
GOWN STRL REIN XL XLG (GOWN DISPOSABLE) ×4 IMPLANT
INST SET MINOR BONE (KITS) ×2 IMPLANT
KIT ROOM TURNOVER APOR (KITS) ×2 IMPLANT
MANIFOLD NEPTUNE II (INSTRUMENTS) ×2 IMPLANT
NS IRRIG 1000ML POUR BTL (IV SOLUTION) ×2 IMPLANT
PACK BASIC LIMB (CUSTOM PROCEDURE TRAY) IMPLANT
SET BASIN LINEN APH (SET/KITS/TRAYS/PACK) ×2 IMPLANT
SPONGE GAUZE 4X4 12PLY (GAUZE/BANDAGES/DRESSINGS) ×2 IMPLANT
SUT BRALON NAB BRD #1 30IN (SUTURE) ×1 IMPLANT
SUT ETHILON 3 0 FSL (SUTURE) ×2 IMPLANT
SUT PLAIN CT 1/2CIR 2-0 27IN (SUTURE) ×1 IMPLANT
SYR BULB IRRIGATION 50ML (SYRINGE) ×2 IMPLANT

## 2012-05-20 NOTE — Progress Notes (Signed)
The History and Physical is unchanged. I have examined the patient. The patient is medically able to have surgery on the left knee . [name]  

## 2012-05-20 NOTE — Anesthesia Preprocedure Evaluation (Signed)
Anesthesia Evaluation  Patient identified by MRN, date of birth, ID band Patient awake    Reviewed: Allergy & Precautions, H&P , NPO status , Patient's Chart, lab work & pertinent test results  Airway Mallampati: I TM Distance: >3 FB     Dental  (+) Teeth Intact   Pulmonary asthma , COPD COPD inhaler, Recent URI ,  + rhonchi   + wheezing      Cardiovascular hypertension, Pt. on medications Rhythm:Regular Rate:Normal     Neuro/Psych PSYCHIATRIC DISORDERS Anxiety Depression    GI/Hepatic GERD-  ,  Endo/Other    Renal/GU      Musculoskeletal  (+) Fibromyalgia -  Abdominal   Peds  Hematology   Anesthesia Other Findings   Reproductive/Obstetrics                           Anesthesia Physical Anesthesia Plan  ASA: III  Anesthesia Plan: Spinal   Post-op Pain Management:    Induction:   Airway Management Planned: Nasal Cannula  Additional Equipment:   Intra-op Plan:   Post-operative Plan:   Informed Consent: I have reviewed the patients History and Physical, chart, labs and discussed the procedure including the risks, benefits and alternatives for the proposed anesthesia with the patient or authorized representative who has indicated his/her understanding and acceptance.     Plan Discussed with:   Anesthesia Plan Comments:         Anesthesia Quick Evaluation

## 2012-05-20 NOTE — Op Note (Signed)
[name], [name]              ACCOUNT NO.:  [id number]  MEDICAL RECORD NO.:  [id number]  LOCATION:  [facility]                          FACILITY:  [facility]  PHYSICIAN:  J. Darreld Mclean, M.D. DATE OF BIRTH:  1966-02-16  DATE OF PROCEDURE: DATE OF DISCHARGE:  05/20/2012                              OPERATIVE REPORT   PREOPERATIVE DIAGNOSIS:  Ganglion cyst, left lateral knee near the patella distally.  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  No true ganglion seen.  PROCEDURE:  Repair of fascial defect, left knee, near the distal patella.  The patient is a 46 year old female who has had swelling of her left knee area that comes and goes, has gotten progressively worse.  It hurts more when she tries to squat or bend.  I saw her in the office and showed a rather large lesion, I thought it was a cyst.  I saw this morning and could not find much of a cyst to her lesion.  She flexes her knee.  She seems to have some trismus area, and talked to her previously about a ganglion cyst and that it could recur.  She agreed to the procedure, risks and imponderables.  While in the holding area, I marked the area of the pain and lesion area and made a circle around it and then put my initials to identify that the left knee was the correct surgical site.  She placed a mark on the left knee.  The patient was brought to the operating room, given spinal anesthesia.  A tourniquet was placed, deflated the left upper thigh. She was then prepped and draped in usual manner.  Generalized time-out identifying the patient as [name] and we are doing the surgery for excision of a suspected ganglion on the left knee area.  All instrumentation was properly positioned and everything was working, and the OR team knew each other.  Leg was elevated and wrapped circumferentially with an Esmarch bandage. Tourniquet was inflated to 300 mmHg.  Esmarch bandage was removed. Small  incision was made directly over the area of previously marked prior to the procedure.  With careful dissection, the wound was opened, soft tissue was examined and there was no apparent ganglion, but there was fascial defect present with some material coming through this herniation.  I removed the tissue.  I flexed the knee completely with some slight puffiness of course underneath the synovium coming, trying to go through the defect.  I then repaired the defect using #1 Surgilon suture in an interrupted figure-of-eight fashion.  This gave a good repair.  I flexed the knee and there was no evidence of any herniation. There were no defects present.  Wound was then reapproximated using 2-0 plain and a subcuticular suture of 3-0 nylon.  Sterile bulky dressing was applied.  Sheet cotton applied and then Ace bandage applied loosely. Total tourniquet time was 19 minutes.  No drains were used.  She will go to recovery in good condition.  I will see her in the office in approximately 2 weeks. Wound care has been discussed with the patient and given as an outpatient order.  If any difficulties, contact me  through the office hospital beeper system.          ______________________________ Shela Commons. Darreld Mclean, M.D.     JWK/MEDQ  D:  05/20/2012  T:  05/20/2012  Job:  [id number]

## 2012-05-20 NOTE — Anesthesia Postprocedure Evaluation (Signed)
Anesthesia Post Note  Patient: [name]  Procedure(s) Performed: Procedure(s) (LRB): EXCISION MASS (Left)  Anesthesia type: Spinal  Patient location: PACU  Post pain: Pain level controlled  Post assessment: Post-op Vital signs reviewed, Patient's Cardiovascular Status Stable, Respiratory Function Stable, Patent Airway, No signs of Nausea or vomiting and Pain level controlled  Last Vitals:  Filed Vitals:   05/20/12 0825  BP: 125/69  Pulse: 62  Temp: 36.5 C  Resp: 14    Post vital signs: Reviewed and stable  Level of consciousness: awake and alert   Complications: No apparent anesthesia complications

## 2012-05-20 NOTE — Anesthesia Procedure Notes (Signed)
Procedure Name: MAC Date/Time: 05/20/2012 7:30 AM Performed by: Franco Nones Pre-anesthesia Checklist: Patient identified, Emergency Drugs available, Suction available, Timeout performed and Patient being monitored Patient Re-evaluated:Patient Re-evaluated prior to inductionOxygen Delivery Method: Nasal Cannula    Spinal  Patient location during procedure: OR Start time: 05/20/2012 7:36 AM End time: 05/20/2012 7:41 AM Staffing CRNA/Resident: Minerva Areola S Preanesthetic Checklist Completed: patient identified, site marked, surgical consent, pre-op evaluation, timeout performed, IV checked, risks and benefits discussed and monitors and equipment checked Spinal Block Patient position: left lateral decubitus Prep: Betadine and prep x 3 Patient monitoring: heart rate, cardiac monitor, continuous pulse ox and blood pressure Approach: left paramedian Location: L3-4 Injection technique: single-shot Needle Needle type: Spinocan  Needle gauge: 22 G Needle length: 9 cm Assessment Sensory level: T8 (level at 745) Events: clear {re and post injection Additional Notes  ATTEMPTS:1 TRAY ID: [id number] TRAY EXPIRATION DATE: 2014-12

## 2012-05-20 NOTE — Brief Op Note (Signed)
05/20/2012  8:17 AM  PATIENT:  [name]  46 y.o. female  PRE-OPERATIVE DIAGNOSIS:  ganglion cyst left knee  POST-OPERATIVE DIAGNOSIS:  herniation,fascial layer defect left knee  PROCEDURE:  Procedure(s) (LRB) with comments: EXCISION MASS (Left) - Repair of Fascial Defect Left Knee  SURGEON:  Surgeon(s) and Role:    * Darreld Mclean, MD - Primary  PHYSICIAN ASSISTANT:   ASSISTANTS: none   ANESTHESIA:   spinal  EBL:  Total I/O In: 700 [I.V.:700] Out: 0   BLOOD ADMINISTERED:none  DRAINS: none   LOCAL MEDICATIONS USED:  NONE  SPECIMEN:  Source of Specimen:  material that came through herniation of fascial defect left knee  DISPOSITION OF SPECIMEN:  PATHOLOGY  COUNTS:  YES  TOURNIQUET:   Total Tourniquet Time Documented: Thigh (Left) - 18 minutes  DICTATION: .Other Dictation: Dictation Number [id number]  PLAN OF CARE: Discharge to home after PACU  PATIENT DISPOSITION:  PACU - hemodynamically stable.   Delay start of Pharmacological VTE agent (>24hrs) due to surgical blood loss or risk of bleeding: not applicable

## 2012-05-20 NOTE — Transfer of Care (Signed)
Immediate Anesthesia Transfer of Care Note  Patient: [name]  Procedure(s) Performed: Procedure(s) (LRB): EXCISION MASS (Left)  Patient Location: PACU  Anesthesia Type: SAB  Level of Consciousness: awake  Airway & Oxygen Therapy: Patient Spontanous Breathing and nasal cannula  Post-op Assessment: Report given to PACU RN, Post -op Vital signs reviewed and stable. SAB Level  T 8  Post vital signs: Reviewed and stable  Complications: No apparent anesthesia complications

## 2012-05-22 ENCOUNTER — Encounter (HOSPITAL_COMMUNITY): Payer: Self-pay | Admitting: Orthopaedic Surgery

## 2012-06-05 ENCOUNTER — Other Ambulatory Visit (HOSPITAL_COMMUNITY): Payer: Self-pay | Admitting: Cardiovascular Disease

## 2012-06-05 ENCOUNTER — Other Ambulatory Visit (HOSPITAL_COMMUNITY): Payer: Self-pay | Admitting: Internal Medicine

## 2012-06-05 DIAGNOSIS — R0600 Dyspnea, unspecified: Secondary | ICD-10-CM

## 2012-06-05 DIAGNOSIS — R079 Chest pain, unspecified: Secondary | ICD-10-CM

## 2012-06-05 DIAGNOSIS — Z0000 Encounter for general adult medical examination without abnormal findings: Secondary | ICD-10-CM

## 2012-06-08 ENCOUNTER — Ambulatory Visit (HOSPITAL_COMMUNITY): Payer: Medicaid Other

## 2012-06-22 ENCOUNTER — Ambulatory Visit (HOSPITAL_COMMUNITY)
Admission: RE | Admit: 2012-06-22 | Discharge: 2012-06-22 | Disposition: A | Discharge status: Home / Self Care | Payer: Medicaid Other | Source: Ambulatory Visit | Attending: Internal Medicine | Admitting: Internal Medicine

## 2012-06-22 DIAGNOSIS — Z0000 Encounter for general adult medical examination without abnormal findings: Secondary | ICD-10-CM

## 2012-06-22 DIAGNOSIS — R922 Inconclusive mammogram: Secondary | ICD-10-CM | POA: Insufficient documentation

## 2012-06-24 ENCOUNTER — Other Ambulatory Visit: Payer: Self-pay | Admitting: Internal Medicine

## 2012-06-24 DIAGNOSIS — R928 Other abnormal and inconclusive findings on diagnostic imaging of breast: Secondary | ICD-10-CM

## 2012-06-25 ENCOUNTER — Ambulatory Visit (HOSPITAL_COMMUNITY)
Admission: RE | Admit: 2012-06-25 | Discharge: 2012-06-25 | Disposition: A | Discharge status: Home / Self Care | Payer: Medicaid Other | Source: Ambulatory Visit | Attending: Cardiovascular Disease | Admitting: Cardiovascular Disease

## 2012-06-25 DIAGNOSIS — R079 Chest pain, unspecified: Secondary | ICD-10-CM | POA: Insufficient documentation

## 2012-06-25 DIAGNOSIS — R0609 Other forms of dyspnea: Secondary | ICD-10-CM | POA: Insufficient documentation

## 2012-06-25 DIAGNOSIS — E785 Hyperlipidemia, unspecified: Secondary | ICD-10-CM | POA: Insufficient documentation

## 2012-06-25 DIAGNOSIS — Z8249 Family history of ischemic heart disease and other diseases of the circulatory system: Secondary | ICD-10-CM | POA: Insufficient documentation

## 2012-06-25 DIAGNOSIS — I10 Essential (primary) hypertension: Secondary | ICD-10-CM | POA: Insufficient documentation

## 2012-06-25 DIAGNOSIS — R002 Palpitations: Secondary | ICD-10-CM | POA: Insufficient documentation

## 2012-06-25 DIAGNOSIS — E669 Obesity, unspecified: Secondary | ICD-10-CM | POA: Insufficient documentation

## 2012-06-25 DIAGNOSIS — R0989 Other specified symptoms and signs involving the circulatory and respiratory systems: Secondary | ICD-10-CM | POA: Insufficient documentation

## 2012-06-25 DIAGNOSIS — R0600 Dyspnea, unspecified: Secondary | ICD-10-CM

## 2012-06-25 DIAGNOSIS — R42 Dizziness and giddiness: Secondary | ICD-10-CM | POA: Insufficient documentation

## 2012-06-25 DIAGNOSIS — F17200 Nicotine dependence, unspecified, uncomplicated: Secondary | ICD-10-CM | POA: Insufficient documentation

## 2012-06-25 MED ORDER — TECHNETIUM TC 99M SESTAMIBI GENERIC - CARDIOLITE
32.0000 | Freq: Once | INTRAVENOUS | Status: AC | PRN
  Administered 2012-06-25: 32 via INTRAVENOUS

## 2012-06-25 MED ORDER — TECHNETIUM TC 99M SESTAMIBI GENERIC - CARDIOLITE
10.3000 | Freq: Once | INTRAVENOUS | Status: AC | PRN
  Administered 2012-06-25: 10 via INTRAVENOUS

## 2012-06-25 NOTE — Procedures (Addendum)
[facility] [address] [place] [address] [place] [address] [phone]  Cardiology Nuclear Med Study  [name] is a 46 y.o. female     MRN : [id number]     DOB: [birth year]  Procedure Date: 06/25/2012  Nuclear Med Background Indication for Stress Test:  Evaluation for Ischemia History:  No Prior Cardiac Hostory Cardiac Risk Factors: Family History - CAD, Hypertension, Lipids, Obesity and Smoker  Symptoms:  Chest Pain, Dizziness, DOE and Palpitations   Nuclear Pre-Procedure Caffeine/Decaff Intake:  None NPO After: 7:00am   IV Site: R Antecubital  IV 0.9% NS with Angio Cath:  22g  Chest Size (in):  38 IV Started by: Bonnita Levan, RN  Height: 5\' 4"  (1.626 m)  Cup Size: n/a  BMI:  Body mass index is 26.78 kg/(m^2). Weight:  156 lb (70.761 kg)   Tech Comments:  Patient held Labetolol x 24 hrs    Nuclear Med Study 1 or 2 day study: 1 day  Stress Test Type:  Stress  Order Authorizing Provider:  Thurmon Fair, MD   Resting Radionuclide: Technetium 59m Sestamibi  Resting Radionuclide Dose: 10.3 mCi   Stress Radionuclide:  Technetium 79m Sestamibi  Stress Radionuclide Dose: 32.0 mCi           Stress Protocol Rest HR: 65 Stress HR:160  Rest BP: 157/106 Stress BP: 197/112  Exercise Time (min): 11:00 METS: 13.40          Dose of Adenosine (mg):  n/a Dose of Lexiscan: n/a mg  Dose of Atropine (mg): n/a Dose of Dobutamine: n/a mcg/kg/min (at max HR)  Stress Test Technologist: Ernestene Mention, CCT Nuclear Technologist: Gonzella Lex, CNMT   Rest Procedure:  Myocardial perfusion imaging was performed at rest 45 minutes following the intravenous administration of Technetium 5m Sestamibi. Stress Procedure:  The patient performed treadmill exercise using a Bruce  Protocol for 11:00 minutes. The patient stopped due to shortness of breath and leg fatigue. Patient also experienced some chest pain underneath her left breast. Symptoms   were resolved after five minutes into the recovery stage.    There were no significant ST-T wave changes.  Technetium 81m Sestamibi was injected at peak exercise and myocardial perfusion imaging was performed after a brief delay.  Transient Ischemic Dilatation (Normal <1.22):  0.91 Lung/Heart Ratio (Normal <0.45):  0.30 QGS EDV:  85 ml QGS ESV:  35 ml LV Ejection Fraction: 59%        Rest ECG: NSR - Normal EKG  Stress ECG: No significant change from baseline ECG  QPS Raw Data Images:  Normal; no motion artifact; normal heart/lung ratio. Stress Images:  Normal homogeneous uptake in all areas of the myocardium. Rest Images:  Normal homogeneous uptake in all areas of the myocardium. Subtraction (SDS):  No evidence of ischemia.  Impression Exercise Capacity:  Excellent exercise capacity. BP Response:  Normal blood pressure response. Clinical Symptoms:  There is dyspnea. ECG Impression:  No significant ST segment change suggestive of ischemia. Comparison with Prior Nuclear Study: No images to compare  Overall Impression:  Normal stress nuclear study.  LV Wall Motion:  NL LV Function; NL Wall Motion   Runell Gess, MD  06/25/2012 5:59 PM

## 2012-07-10 ENCOUNTER — Ambulatory Visit (HOSPITAL_COMMUNITY)
Admission: RE | Admit: 2012-07-10 | Discharge: 2012-07-10 | Disposition: A | Discharge status: Home / Self Care | Payer: Medicaid Other | Source: Ambulatory Visit | Attending: Internal Medicine | Admitting: Internal Medicine

## 2012-07-10 ENCOUNTER — Encounter (HOSPITAL_COMMUNITY): Payer: Medicaid Other

## 2012-07-10 DIAGNOSIS — R928 Other abnormal and inconclusive findings on diagnostic imaging of breast: Secondary | ICD-10-CM

## 2012-07-20 ENCOUNTER — Other Ambulatory Visit: Payer: Self-pay | Admitting: Radiology

## 2012-07-22 ENCOUNTER — Encounter (HOSPITAL_COMMUNITY): Payer: Self-pay

## 2012-07-22 ENCOUNTER — Encounter (HOSPITAL_COMMUNITY): Payer: Self-pay | Admitting: Pharmacy Technician

## 2012-07-22 ENCOUNTER — Encounter (HOSPITAL_COMMUNITY)
Admission: RE | Admit: 2012-07-22 | Discharge: 2012-07-22 | Disposition: A | Discharge status: Home / Self Care | Payer: Medicaid Other | Source: Ambulatory Visit | Attending: Orthopaedic Surgery | Admitting: Orthopaedic Surgery

## 2012-07-22 LAB — CBC WITH DIFFERENTIAL/PLATELET
Basophils Relative: 1 % (ref 0–1)
Eosinophils Relative: 3 % (ref 0–5)
HCT: 39 % (ref 36.0–46.0)
Hemoglobin: 13.2 g/dL (ref 12.0–15.0)
MCH: 33.8 pg (ref 26.0–34.0)
MCHC: 33.8 g/dL (ref 30.0–36.0)
MCV: 99.7 fL (ref 78.0–100.0)
Monocytes Absolute: 0.5 10*3/uL (ref 0.1–1.0)
Monocytes Relative: 7 % (ref 3–12)
Neutro Abs: 3.9 10*3/uL (ref 1.7–7.7)

## 2012-07-22 LAB — URINALYSIS, ROUTINE W REFLEX MICROSCOPIC
Glucose, UA: NEGATIVE mg/dL
Hgb urine dipstick: NEGATIVE
Ketones, ur: NEGATIVE mg/dL
Leukocytes, UA: NEGATIVE
pH: 5.5 (ref 5.0–8.0)

## 2012-07-22 LAB — BASIC METABOLIC PANEL
BUN: 11 mg/dL (ref 6–23)
CO2: 25 mEq/L (ref 19–32)
Chloride: 103 mEq/L (ref 96–112)
Creatinine, Ser: 0.71 mg/dL (ref 0.50–1.10)
GFR calc Af Amer: >90 mL/min (ref >90)
Glucose, Bld: 91 mg/dL (ref 70–99)
Potassium: 4 mEq/L (ref 3.5–5.1)

## 2012-07-22 NOTE — H&P (Signed)
[name] is an 47 y.o. female.   Chief Complaint: Carpal tunnel syndrome left HPI: She has long history of pain in the left wrist and hand in the median nerve distribution.  She has had positive EMG showing bilateral carpal tunnel syndrome.  She is post release on the right and doing well there.  She now elects to have the left carpal tunnel treated surgically.  She has not responded well to rest, splint, medicines.  She understands the risks and imponderables and agrees to the out patient surgery.    Past Medical History  Diagnosis Date  . Asthma   . COPD (chronic obstructive pulmonary disease)   . Hypertension   . GERD (gastroesophageal reflux disease)   . Anxiety disorder   . Chronic pain syndrome   . Anxiety   . Depression   . Fibromyalgia     Past Surgical History  Procedure Date  . Cholecystectomy   . Cesarean section   . Carpal tunnel release   . Esophagogastroduodenoscopy 07/31/2011    Procedure: ESOPHAGOGASTRODUODENOSCOPY (EGD);  Surgeon: Malissa Hippo, MD;  Location: [facility];  Service: Endoscopy;  Laterality: N/A;  Elease Hashimoto dilation 07/31/2011    Procedure: Elease Hashimoto DILATION;  Surgeon: Malissa Hippo, MD;  Location: [facility];  Service: Endoscopy;;  . Endometrial ablation   . Mass excision 05/20/2012    Procedure: EXCISION MASS;  Surgeon: Darreld Mclean, MD;  Location: [facility];  Service: Orthopedics;  Laterality: Left;  Repair of Fascial Defect Left Knee    Family History  Problem Relation Age of Onset  . Asthma Mother   . Asthma Father   . Cancer Father    Social History:  reports that she quit smoking about 20 months ago. Her smoking use included Cigarettes. She has never used smokeless tobacco. She reports that she drinks about 1.2 ounces of alcohol per week. She reports that she does not use illicit drugs.  Allergies: No Known Allergies  No prescriptions prior to admission    No results found for this or any previous visit (from the past 48  hour(s)). No results found.  Review of Systems  Respiratory:       COPD and asthma  Cardiovascular:       Hypertension.  Gastrointestinal: Positive for heartburn.       GERD  Musculoskeletal:       Pain left hand, nocturnal, in median nerve distribution.  Past history of carpal tunnel release on the right which is much improved.    There were no vitals taken for this visit. Physical Exam  Constitutional: She is oriented to person, place, and time. She appears well-developed and well-nourished.  HENT:  Head: Normocephalic and atraumatic.  Eyes: Conjunctivae normal and EOM are normal. Pupils are equal, round, and reactive to light.  Neck: Normal range of motion. Neck supple.  Cardiovascular: Normal rate, regular rhythm, normal heart sounds and intact distal pulses.   Respiratory: Effort normal.  GI: Soft. Bowel sounds are normal.  Musculoskeletal: She exhibits tenderness (Pain left wrist.  Positive Phalen and Tinel signs.  Decreased sensation median nerve distribution.).  Neurological: She is alert and oriented to person, place, and time. She has normal reflexes.  Skin: Skin is warm.  Psychiatric: She has a normal mood and affect. Her behavior is normal. Judgment and thought content normal.     Assessment/Plan Carpal tunnel left  For open release on the left wrist for carpal tunnel syndrome.  Anjali Manzella 07/22/2012, 12:06 PM

## 2012-07-22 NOTE — Patient Instructions (Addendum)
20 [name]  07/22/2012   Your procedure is scheduled on:  07/23/2012  Report to [facility] at  615  AM.  Call this number if you have problems the morning of surgery: [phone]   Remember:   Do not eat food:After Midnight.  May have clear liquids:until Midnight .    Take these medicines the morning of surgery with A SIP OF WATER: flexaril,lisnopril,protonix,norvasc,wellbutrin,valium,labetolol, percocet. Take inhaler and nebulizer before you come and bring your inhaler with you.   Do not wear jewelry, make-up or nail polish.  Do not wear lotions, powders, or perfumes.   Do not shave 48 hours prior to surgery. Men may shave face and neck.  Do not bring valuables to the hospital.  Contacts, dentures or bridgework may not be worn into surgery.  Leave suitcase in the car. After surgery it may be brought to your room.  For patients admitted to the hospital, checkout time is 11:00 AM the day of discharge.   Patients discharged the day of surgery will not be allowed to drive home.  Name and phone number of your driver: family  Special Instructions: Shower using CHG 2 nights before surgery and the night before surgery.  If you shower the day of surgery use CHG.  Use special wash - you have one bottle of CHG for all showers.  You should use approximately 1/3 of the bottle for each shower.   Please read over the following fact sheets that you were given: Pain Booklet, Coughing and Deep Breathing, MRSA Information, Surgical Site Infection Prevention, Anesthesia Post-op Instructions and Care and Recovery After Surgery Carpal Tunnel Release Carpal tunnel release is done to relieve the pressure on the nerves and tendons on the bottom side of your wrist.  LET YOUR CAREGIVER KNOW ABOUT:   Allergies to food or medicine.  Medicines taken, including vitamins, herbs, eyedrops, over-the-counter medicines, and creams.  Use of steroids (by mouth or creams).  Previous problems with anesthetics  or numbing medicines.  History of bleeding problems or blood clots.  Previous surgery.  Other health problems, including diabetes and kidney problems.  Possibility of pregnancy, if this applies. RISKS AND COMPLICATIONS  Some problems that may happen after this procedure include:  Infection.  Damage to the nerves, arteries or tendons could occur. This would be very uncommon.  Bleeding. BEFORE THE PROCEDURE   This surgery may be done while you are asleep (general anesthetic) or may be done under a block where only your forearm and the surgical area is numb.  If the surgery is done under a block, the numbness will gradually wear off within several hours after surgery. HOME CARE INSTRUCTIONS   Have a responsible person with you for 24 hours.  Do not drive a car or use public transportation for 24 hours.  Only take over-the-counter or prescription medicines for pain, discomfort, or fever as directed by your caregiver. Take them as directed.  You may put ice on the palm side of the affected wrist.  Put ice in a plastic bag.  Place a towel between your skin and the bag.  Leave the ice on for 20 to 30 minutes, 4 times per day.  If you were given a splint to keep your wrist from bending, use it as directed. It is important to wear the splint at night or as directed. Use the splint for as long as you have pain or numbness in your hand, arm, or wrist. This may take  1 to 2 months.  Keep your hand raised (elevated) above the level of your heart as much as possible. This keeps swelling down and helps with discomfort.  Change bandages (dressings) as directed.  Keep the wound clean and dry. SEEK MEDICAL CARE IF:   You develop pain not relieved with medications.  You develop numbness of your hand.  You develop bleeding from your surgical site.  You have an oral temperature above 102 F (38.9 C).  You develop redness or swelling of the surgical site.  You develop new,  unexplained problems. SEEK IMMEDIATE MEDICAL CARE IF:   You develop a rash.  You have difficulty breathing.  You develop any reaction or side effects to medications given. Document Released: 09/21/2003 Document Revised: 09/23/2011 Document Reviewed: 05/07/2007 [facility] Patient Information 2013 [place], [place]. PATIENT INSTRUCTIONS POST-ANESTHESIA  IMMEDIATELY FOLLOWING SURGERY:  Do not drive or operate machinery for the first twenty four hours after surgery.  Do not make any important decisions for twenty four hours after surgery or while taking narcotic pain medications or sedatives.  If you develop intractable nausea and vomiting or a severe headache please notify your doctor immediately.  FOLLOW-UP:  Please make an appointment with your surgeon as instructed. You do not need to follow up with anesthesia unless specifically instructed to do so.  WOUND CARE INSTRUCTIONS (if applicable):  Keep a dry clean dressing on the anesthesia/puncture wound site if there is drainage.  Once the wound has quit draining you may leave it open to air.  Generally you should leave the bandage intact for twenty four hours unless there is drainage.  If the epidural site drains for more than 36-48 hours please call the anesthesia department.  QUESTIONS?:  Please feel free to call your physician or the hospital operator if you have any questions, and they will be happy to assist you.

## 2012-07-23 ENCOUNTER — Encounter (HOSPITAL_COMMUNITY): Payer: Self-pay | Admitting: Anesthesiology

## 2012-07-23 ENCOUNTER — Encounter (HOSPITAL_COMMUNITY): Payer: Self-pay | Admitting: *Deleted

## 2012-07-23 ENCOUNTER — Encounter (HOSPITAL_COMMUNITY)
Admission: RE | Disposition: A | Discharge status: Home / Self Care | Payer: Self-pay | Source: Ambulatory Visit | Attending: Orthopaedic Surgery

## 2012-07-23 ENCOUNTER — Ambulatory Visit (HOSPITAL_COMMUNITY): Payer: Medicaid Other | Admitting: Anesthesiology

## 2012-07-23 ENCOUNTER — Ambulatory Visit (HOSPITAL_COMMUNITY)
Admission: RE | Admit: 2012-07-23 | Discharge: 2012-07-23 | Disposition: A | Discharge status: Home / Self Care | Payer: Medicaid Other | Source: Ambulatory Visit | Attending: Orthopaedic Surgery | Admitting: Orthopaedic Surgery

## 2012-07-23 DIAGNOSIS — J449 Chronic obstructive pulmonary disease, unspecified: Secondary | ICD-10-CM | POA: Insufficient documentation

## 2012-07-23 DIAGNOSIS — G5600 Carpal tunnel syndrome, unspecified upper limb: Secondary | ICD-10-CM | POA: Insufficient documentation

## 2012-07-23 DIAGNOSIS — I10 Essential (primary) hypertension: Secondary | ICD-10-CM | POA: Insufficient documentation

## 2012-07-23 DIAGNOSIS — J4489 Other specified chronic obstructive pulmonary disease: Secondary | ICD-10-CM | POA: Insufficient documentation

## 2012-07-23 HISTORY — PX: CARPAL TUNNEL RELEASE: SHX101

## 2012-07-23 SURGERY — CARPAL TUNNEL RELEASE
Anesthesia: Regional | Site: Hand | Laterality: Left | Wound class: Clean

## 2012-07-23 MED ORDER — ONDANSETRON HCL 4 MG/2ML IJ SOLN
4.0000 mg | Freq: Once | INTRAMUSCULAR | Status: AC
  Administered 2012-07-23: 4 mg via INTRAVENOUS

## 2012-07-23 MED ORDER — FENTANYL CITRATE 0.05 MG/ML IJ SOLN
25.0000 ug | INTRAMUSCULAR | Status: DC | PRN
  Administered 2012-07-23 (×4): 50 ug via INTRAVENOUS

## 2012-07-23 MED ORDER — CEFAZOLIN SODIUM-DEXTROSE 2-3 GM-% IV SOLR
INTRAVENOUS | Status: AC
  Filled 2012-07-23: qty 50

## 2012-07-23 MED ORDER — STERILE WATER FOR IRRIGATION IR SOLN
Status: DC | PRN
  Administered 2012-07-23: 500 mL

## 2012-07-23 MED ORDER — CHLORHEXIDINE GLUCONATE 4 % EX LIQD: 60.0000 mL | Freq: Once | CUTANEOUS | Status: DC

## 2012-07-23 MED ORDER — CEFAZOLIN SODIUM-DEXTROSE 2-3 GM-% IV SOLR
INTRAVENOUS | Status: DC | PRN
  Administered 2012-07-23: 2 g via INTRAVENOUS

## 2012-07-23 MED ORDER — MIDAZOLAM HCL 2 MG/2ML IJ SOLN
INTRAMUSCULAR | Status: AC
  Filled 2012-07-23: qty 2

## 2012-07-23 MED ORDER — LACTATED RINGERS IV SOLN
INTRAVENOUS | Status: DC
  Administered 2012-07-23: 1000 mL via INTRAVENOUS

## 2012-07-23 MED ORDER — ONDANSETRON HCL 4 MG/2ML IJ SOLN
INTRAMUSCULAR | Status: AC
  Filled 2012-07-23: qty 2

## 2012-07-23 MED ORDER — FENTANYL CITRATE 0.05 MG/ML IJ SOLN
INTRAMUSCULAR | Status: DC | PRN
  Administered 2012-07-23 (×2): 50 ug via INTRAVENOUS

## 2012-07-23 MED ORDER — LIDOCAINE HCL (PF) 0.5 % IJ SOLN
INTRAMUSCULAR | Status: AC
  Filled 2012-07-23: qty 50

## 2012-07-23 MED ORDER — ONDANSETRON HCL 4 MG/2ML IJ SOLN
4.0000 mg | Freq: Once | INTRAMUSCULAR | Status: AC | PRN
  Administered 2012-07-23: 4 mg via INTRAVENOUS

## 2012-07-23 MED ORDER — PROPOFOL INFUSION 10 MG/ML OPTIME
INTRAVENOUS | Status: DC | PRN
  Administered 2012-07-23: 65 ug/kg/min via INTRAVENOUS

## 2012-07-23 MED ORDER — MIDAZOLAM HCL 5 MG/5ML IJ SOLN
INTRAMUSCULAR | Status: DC | PRN
  Administered 2012-07-23: 1 mg via INTRAVENOUS
  Administered 2012-07-23: 2 mg via INTRAVENOUS
  Administered 2012-07-23: 1 mg via INTRAVENOUS

## 2012-07-23 MED ORDER — LIDOCAINE HCL (PF) 0.5 % IJ SOLN
INTRAMUSCULAR | Status: DC | PRN
  Administered 2012-07-23: 250 mg via INTRAVENOUS

## 2012-07-23 MED ORDER — PROPOFOL 10 MG/ML IV EMUL
INTRAVENOUS | Status: AC
  Filled 2012-07-23: qty 20

## 2012-07-23 MED ORDER — FENTANYL CITRATE 0.05 MG/ML IJ SOLN: 25.0000 ug | Freq: Once | INTRAMUSCULAR | Status: DC

## 2012-07-23 MED ORDER — FENTANYL CITRATE 0.05 MG/ML IJ SOLN
INTRAMUSCULAR | Status: AC
  Filled 2012-07-23: qty 2

## 2012-07-23 MED ORDER — MIDAZOLAM HCL 2 MG/2ML IJ SOLN
1.0000 mg | INTRAMUSCULAR | Status: DC | PRN
  Administered 2012-07-23: 2 mg via INTRAVENOUS

## 2012-07-23 MED ORDER — SODIUM CHLORIDE 0.9 % IJ SOLN
INTRAMUSCULAR | Status: DC | PRN
  Administered 2012-07-23: 1 mL via INTRAVENOUS

## 2012-07-23 MED ORDER — CEFAZOLIN SODIUM-DEXTROSE 2-3 GM-% IV SOLR: 2.0000 g | Freq: Once | INTRAVENOUS | Status: DC

## 2012-07-23 SURGICAL SUPPLY — 41 items
BAG HAMPER (MISCELLANEOUS) ×2 IMPLANT
BANDAGE ELASTIC 3 VELCRO NS (GAUZE/BANDAGES/DRESSINGS) ×2 IMPLANT
BANDAGE ELASTIC 3 VELCRO ST LF (GAUZE/BANDAGES/DRESSINGS) ×1 IMPLANT
BANDAGE ESMARK 4X12 BL STRL LF (DISPOSABLE) ×1 IMPLANT
BLADE SURG 15 STRL LF DISP TIS (BLADE) ×1 IMPLANT
BLADE SURG 15 STRL SS (BLADE) ×2
BNDG CMPR 12X4 ELC STRL LF (DISPOSABLE) ×1
BNDG ESMARK 4X12 BLUE STRL LF (DISPOSABLE) ×2
CLOTH BEACON ORANGE TIMEOUT ST (SAFETY) ×2 IMPLANT
COVER LIGHT HANDLE STERIS (MISCELLANEOUS) ×4 IMPLANT
CUFF TOURNIQUET SINGLE 18IN (TOURNIQUET CUFF) ×2 IMPLANT
DRSG XEROFORM 1X8 (GAUZE/BANDAGES/DRESSINGS) ×1 IMPLANT
DURAPREP 26ML APPLICATOR (WOUND CARE) ×2 IMPLANT
ELECT NDL TIP 2.8 STRL (NEEDLE) IMPLANT
ELECT NEEDLE TIP 2.8 STRL (NEEDLE) IMPLANT
ELECT REM PT RETURN 9FT ADLT (ELECTROSURGICAL) ×2
ELECTRODE REM PT RTRN 9FT ADLT (ELECTROSURGICAL) ×1 IMPLANT
FORMALIN 10 PREFIL 120ML (MISCELLANEOUS) ×2 IMPLANT
GLOVE BIO SURGEON STRL SZ8 (GLOVE) ×2 IMPLANT
GLOVE BIO SURGEON STRL SZ8.5 (GLOVE) ×2 IMPLANT
GLOVE BIOGEL PI IND STRL 7.0 (GLOVE) IMPLANT
GLOVE BIOGEL PI INDICATOR 7.0 (GLOVE) ×1
GLOVE ECLIPSE 6.5 STRL STRAW (GLOVE) ×1 IMPLANT
GLOVE EXAM NITRILE MD LF STRL (GLOVE) ×1 IMPLANT
GOWN STRL REIN XL XLG (GOWN DISPOSABLE) ×4 IMPLANT
KIT ROOM TURNOVER APOR (KITS) ×2 IMPLANT
NDL HYPO 18GX1.5 BLUNT FILL (NEEDLE) ×1 IMPLANT
NDL HYPO 27GX1-1/4 (NEEDLE) ×1 IMPLANT
NEEDLE HYPO 18GX1.5 BLUNT FILL (NEEDLE) ×2 IMPLANT
NEEDLE HYPO 27GX1-1/4 (NEEDLE) ×2 IMPLANT
NS IRRIG 1000ML POUR BTL (IV SOLUTION) ×2 IMPLANT
PACK BASIC LIMB (CUSTOM PROCEDURE TRAY) ×2 IMPLANT
PAD ARMBOARD 7.5X6 YLW CONV (MISCELLANEOUS) ×2 IMPLANT
PAD CAST 3X4 CTTN HI CHSV (CAST SUPPLIES) ×1 IMPLANT
PADDING CAST COTTON 3X4 STRL (CAST SUPPLIES) ×2
SET BASIN LINEN APH (SET/KITS/TRAYS/PACK) ×2 IMPLANT
SPONGE GAUZE 4X4 12PLY (GAUZE/BANDAGES/DRESSINGS) ×2 IMPLANT
SUT ETHILON 3 0 FSL (SUTURE) ×2 IMPLANT
SYR 3ML LL SCALE MARK (SYRINGE) ×2 IMPLANT
TOWEL OR 17X26 4PK STRL BLUE (TOWEL DISPOSABLE) ×2 IMPLANT
VESSEL LOOPS MAXI RED (MISCELLANEOUS) ×2 IMPLANT

## 2012-07-23 NOTE — Op Note (Signed)
[name], [name]              ACCOUNT NO.:  [id number]  MEDICAL RECORD NO.:  [id number]  LOCATION:  [facility]                          FACILITY:  [facility]  PHYSICIAN:  J. Darreld Mclean, M.D. DATE OF BIRTH:  [birth year]  DATE OF PROCEDURE: DATE OF DISCHARGE:  07/23/2012                              OPERATIVE REPORT   PREOPERATIVE DIAGNOSIS:  Carpal tunnel syndrome, left.  POSTOPERATIVE DIAGNOSIS:  Carpal tunnel syndrome, left.  PROCEDURE:  Release of volar carpal ligament, saline neurolysis, epineurotomy, left median nerve.  ANESTHESIA:  Regional Bier block.  Please refer to anesthesia record for tourniquet time.  DRAINS:  No drains.  SURGEON:  J. Darreld Mclean, M.D.  INDICATIONS:  The patient is a 47 year old female with known carpal tunnel syndrome on the left.  She has had carpal tunnel syndrome surgeries on the right, did well.  She previously had an EMG showing carpal tunnel syndrome bilaterally, right greater than left.  She delayed in the left side for some time, but she has progressively gotten worse.  She has got nocturnal pain, paresthesias in the median nerve distribution.  She has tried wrist splints, medications without success. She now wants to have surgery on the left.  She understands the procedure having undergone carpal tunnel on the right.  She understands the risks and imponderables have been explained to her.  DESCRIPTION OF PROCEDURE:  The patient was seen in the holding area. The left hand was identified as correct surgical site.  I placed a mark over the left wrist.  The patient was brought to the operating room and given regional Bier block anesthesia to the left upper extremity.  Once this was obtained, the patient was then prepped and draped in usual manner.  A generalized time-out identifying the patient, [name], we are doing the left carpal tunnel surgical release.  All instrumentation properly positioned and working.  Everyone in the OR team  knew each other.  The patient previously had been given 2 g of Ancef antibiotics.  Olecranon incision was made with careful dissection.  The median nerve was identified proximally.  Vessel loop placed around the nerve and a grooved director placed within the carpal tunnel space and the volar carpal ligament was then incised.  Median nerve was obviously compressed.  Retinaculum cut proximally.  Saline neurolysis, epineurotomy of the median nerve carried out.  Specimen of volar carpal ligament sent to Pathology.  Nerve was inspected and no apparent injury. Wound was then reapproximated using 3-0 nylon interrupted vertical mattress manner.  Hand was washed with sterile water and then a sterile dressing was applied.  Bulky dressing applied using a sheet cotton dorsally.  ACE bandage applied loosely.  The patient tolerated the procedure well, will go to recovery in good condition. Prescription for Norco 7.5 will be given for pain.  She will be seen in the office in 1 week.  If any difficulties, contact me through the office hospital beeper system.          ______________________________ Shela Commons. Darreld Mclean, M.D.     JWK/MEDQ  D:  07/23/2012  T:  07/23/2012  Job:  [id number]

## 2012-07-23 NOTE — Progress Notes (Signed)
The History and Physical is unchanged. I have examined the patient. The patient is medically able to have surgery on the left wrist . Darreld Mclean

## 2012-07-23 NOTE — Anesthesia Procedure Notes (Addendum)
Anesthesia Regional Block:  Bier block (IV Regional)  Pre-Anesthetic Checklist: ,, timeout performed, Correct Patient, Correct Site, Correct Laterality, Correct Procedure,, site marked, risks and benefits discussed, Surgical consent, Pre-op evaluation Bier block (IV Regional) Narrative:    Anesthesia Regional Block:  Bier block (IV Regional)  Pre-Anesthetic Checklist: ,,, Correct Patient, Correct Site, Correct Laterality, Correct Procedure, Correct Position, site marked, surgical consent, pre-op evaluation,  Laterality: Left     Needles:  Injection technique: Single-shot      Additional Needles:  Motor weakness within 2 minutes. Bier block (IV Regional) Narrative:  Resident/CRNA: Jiles Harold

## 2012-07-23 NOTE — Transfer of Care (Signed)
Immediate Anesthesia Transfer of Care Note  Patient: [name]  Procedure(s) Performed: Procedure(s) (LRB) with comments: CARPAL TUNNEL RELEASE (Left)  Patient Location: PACU  Anesthesia Type:Bier block  Level of Consciousness: awake and patient cooperative  Airway & Oxygen Therapy: Patient Spontanous Breathing and Patient connected to face mask oxygen  Post-op Assessment: Report given to PACU RN, Post -op Vital signs reviewed and stable and Patient moving all extremities  Post vital signs: Reviewed and stable  Complications: No apparent anesthesia complications

## 2012-07-23 NOTE — Anesthesia Preprocedure Evaluation (Signed)
Anesthesia Evaluation  Patient identified by MRN, date of birth, ID band Patient awake    Reviewed: Allergy & Precautions, H&P , NPO status , Patient's Chart, lab work & pertinent test results  Airway Mallampati: I TM Distance: >3 FB     Dental  (+) Teeth Intact   Pulmonary asthma , COPD COPD inhaler, Recent URI ,  + rhonchi   + wheezing      Cardiovascular hypertension, Pt. on medications Rhythm:Regular Rate:Normal     Neuro/Psych PSYCHIATRIC DISORDERS Anxiety Depression    GI/Hepatic GERD-  ,  Endo/Other    Renal/GU      Musculoskeletal  (+) Fibromyalgia -  Abdominal   Peds  Hematology   Anesthesia Other Findings   Reproductive/Obstetrics                           Anesthesia Physical Anesthesia Plan  ASA: III  Anesthesia Plan: Bier Block   Post-op Pain Management:    Induction: Intravenous  Airway Management Planned: Nasal Cannula  Additional Equipment:   Intra-op Plan:   Post-operative Plan:   Informed Consent: I have reviewed the patients History and Physical, chart, labs and discussed the procedure including the risks, benefits and alternatives for the proposed anesthesia with the patient or authorized representative who has indicated his/her understanding and acceptance.     Plan Discussed with:   Anesthesia Plan Comments:         Anesthesia Quick Evaluation

## 2012-07-23 NOTE — Anesthesia Postprocedure Evaluation (Signed)
  Anesthesia Post-op Note  Patient: [name]  Procedure(s) Performed: Procedure(s) (LRB) with comments: CARPAL TUNNEL RELEASE (Left)  Patient Location: PACU  Anesthesia Type:Bier block  Level of Consciousness: awake, alert , oriented and patient cooperative  Airway and Oxygen Therapy: Patient Spontanous Breathing  Post-op Pain: 3 /10, mild  Post-op Assessment: Post-op Vital signs reviewed, Patient's Cardiovascular Status Stable, Respiratory Function Stable, Patent Airway, No signs of Nausea or vomiting and Pain level controlled  Post-op Vital Signs: Reviewed and stable  Complications: No apparent anesthesia complications

## 2012-07-23 NOTE — Brief Op Note (Signed)
07/23/2012  8:17 AM  PATIENT:  [name]  47 y.o. female  PRE-OPERATIVE DIAGNOSIS:  carpal tunnel syndrome left wrist  POST-OPERATIVE DIAGNOSIS:  carpal tunnel syndrome left wrist  PROCEDURE:  Procedure(s) (LRB) with comments: CARPAL TUNNEL RELEASE (Left)  SURGEON:  Surgeon(s) and Role:    * Darreld Mclean, MD - Primary  PHYSICIAN ASSISTANT:   ASSISTANTS: none   ANESTHESIA:   regional  EBL:  Total I/O In: 250 [I.V.:250] Out: -   BLOOD ADMINISTERED:none  DRAINS: none   LOCAL MEDICATIONS USED:  NONE  SPECIMEN:  Source of Specimen:  Volar carpal ligament left  DISPOSITION OF SPECIMEN:  PATHOLOGY  COUNTS:  YES  TOURNIQUET:   Total Tourniquet Time Documented: Thigh (Left) - 31 minutes  DICTATION: .Other Dictation: Dictation Number [id number]  PLAN OF CARE: Discharge to home after PACU  PATIENT DISPOSITION:  PACU - hemodynamically stable.   Delay start of Pharmacological VTE agent (>24hrs) due to surgical blood loss or risk of bleeding: not applicable

## 2012-07-27 ENCOUNTER — Encounter (HOSPITAL_COMMUNITY): Payer: Self-pay | Admitting: Orthopaedic Surgery

## 2012-09-16 ENCOUNTER — Encounter (INDEPENDENT_AMBULATORY_CARE_PROVIDER_SITE_OTHER): Payer: Self-pay | Admitting: *Deleted

## 2012-09-17 ENCOUNTER — Ambulatory Visit (INDEPENDENT_AMBULATORY_CARE_PROVIDER_SITE_OTHER): Payer: Medicaid Other | Admitting: Internal Medicine

## 2012-09-24 ENCOUNTER — Encounter (INDEPENDENT_AMBULATORY_CARE_PROVIDER_SITE_OTHER): Payer: Self-pay | Admitting: *Deleted

## 2012-09-24 ENCOUNTER — Other Ambulatory Visit (INDEPENDENT_AMBULATORY_CARE_PROVIDER_SITE_OTHER): Payer: Self-pay | Admitting: *Deleted

## 2012-09-24 ENCOUNTER — Ambulatory Visit (INDEPENDENT_AMBULATORY_CARE_PROVIDER_SITE_OTHER): Payer: Medicaid Other | Admitting: Internal Medicine

## 2012-09-24 ENCOUNTER — Encounter (INDEPENDENT_AMBULATORY_CARE_PROVIDER_SITE_OTHER): Payer: Self-pay | Admitting: Internal Medicine

## 2012-09-24 VITALS — BP 92/58 | HR 60 | Temp 97.8°F | Ht 64.0 in | Wt 153.8 lb

## 2012-09-24 DIAGNOSIS — R1310 Dysphagia, unspecified: Secondary | ICD-10-CM

## 2012-09-24 NOTE — Progress Notes (Signed)
Subjective:     Patient ID: [name], female   DOB: Dec 28, 1965, 47 y.o.   MRN: [id number]  HPI [name] presents today with c/o solid food dysphagia. Foods are lodging.  Symptoms x 1 month. All foods are lodging.  She has been taking the Protonix x 2 yrs. Her last EGD/ED was in 2012: please see below. She has been dilated at least twice in the past.  Appetite is good. No weight loss. BMs are normal. NO NSAIDS. Hx of uterine ablation  07/26/2010 EGD/ED: Impression:  3 superficial ulcers at esophageal body.? Pill esophagitis. Biopsy taken.  Small sliding-type hernia with erosion at GE junction secondary to GERD.  Erosive antral gastritis. Please note H. pylori serology was negative in April 2012.  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 albuterol (PROVENTIL HFA;VENTOLIN HFA) 108 (90 BASE) MCG/ACT inhaler Inhale 2 puffs into the lungs every 6 (six) hours as needed for wheezing or shortness of breath. For shortness of breath  1 Inhaler  1  . albuterol (PROVENTIL) (2.5 MG/3ML) 0.083% nebulizer solution Take 2.5 mg by nebulization every 6 (six) hours as needed. For shortness of breath      . albuterol (PROVENTIL) 4 MG tablet Take 4 mg by mouth 3 (three) times daily.      Marland Kitchen amLODipine (NORVASC) 5 MG tablet Take 5 mg by mouth daily.        Marland Kitchen buPROPion (WELLBUTRIN XL) 300 MG 24 hr tablet Take 300 mg by mouth every morning.        . diazepam (VALIUM) 5 MG tablet Take 5 mg by mouth daily as needed. Anxiety/spasms      . fish oil-omega-3 fatty acids 1000 MG capsule Take 2 g by mouth daily.       . fluocinonide cream (LIDEX) 0.05 % Apply 1 application topically 4 (four) times daily.      Marland Kitchen labetalol (NORMODYNE) 200 MG tablet Take 400 mg by mouth 2 (two) times daily.       Marland Kitchen  oxyCODONE-acetaminophen (PERCOCET/ROXICET) 5-325 MG per tablet Take 1 tablet by mouth every 4 (four) hours as needed. Pain.      Marland Kitchen lisinopril (PRINIVIL,ZESTRIL) 20 MG tablet Take 1 tablet (20 mg total) by mouth daily.  30 tablet  0  . pantoprazole (PROTONIX) 40 MG tablet Take 1 tablet (40 mg total) by mouth 2 (two) times daily before a meal.  30 tablet  1   No current facility-administered medications for this visit.        Objective:   Physical Exam see hpi Filed Vitals:   09/24/12 1055  BP: 92/58  Pulse: 60  Temp: 97.8 F (36.6 C)  Height: 5\' 4"  (1.626 m)  Weight: 153 lb 12.8 oz (69.763 kg)   Alert and oriented. Skin warm and dry. Oral mucosa is moist.   . Sclera anicteric, conjunctivae is pink. Thyroid not enlarged. No cervical lymphadenopathy. Bilateral wheezes.Marland Kitchen Heart regular rate and rhythm.  Abdomen is soft. Bowel sounds are positive. No hepatomegaly. No abdominal masses felt. No tenderness.  No edema to lower extremities. Patient is alert and oriented.      Current Outpatient Prescriptions  Medication Sig Dispense Refill  . albuterol (PROVENTIL HFA;VENTOLIN HFA) 108 (90 BASE) MCG/ACT inhaler Inhale 2 puffs into the lungs every 6 (six) hours as needed for  wheezing or shortness of breath. For shortness of breath  1 Inhaler  1  . albuterol (PROVENTIL) (2.5 MG/3ML) 0.083% nebulizer solution Take 2.5 mg by nebulization every 6 (six) hours as needed. For shortness of breath      . albuterol (PROVENTIL) 4 MG tablet Take 4 mg by mouth 3 (three) times daily.      Marland Kitchen amLODipine (NORVASC) 5 MG tablet Take 5 mg by mouth daily.        Marland Kitchen buPROPion (WELLBUTRIN XL) 300 MG 24 hr tablet Take 300 mg by mouth every morning.        . diazepam (VALIUM) 5 MG tablet Take 5 mg by mouth daily as needed. Anxiety/spasms      . fish oil-omega-3 fatty acids 1000 MG capsule Take 2 g by mouth daily.       . fluocinonide cream (LIDEX) 0.05 % Apply 1 application topically 4 (four) times daily.      Marland Kitchen  labetalol (NORMODYNE) 200 MG tablet Take 400 mg by mouth 2 (two) times daily.       Marland Kitchen oxyCODONE-acetaminophen (PERCOCET/ROXICET) 5-325 MG per tablet Take 1 tablet by mouth every 4 (four) hours as needed. Pain.      Marland Kitchen lisinopril (PRINIVIL,ZESTRIL) 20 MG tablet Take 1 tablet (20 mg total) by mouth daily.  30 tablet  0  . pantoprazole (PROTONIX) 40 MG tablet Take 1 tablet (40 mg total) by mouth 2 (two) times daily before a meal.  30 tablet  1   No current facility-administered medications for this visit.   Past Surgical History  Procedure Laterality Date  . Cholecystectomy    . Cesarean section    . Carpal tunnel release    . Esophagogastroduodenoscopy  07/31/2011    Procedure: ESOPHAGOGASTRODUODENOSCOPY (EGD);  Surgeon: Malissa Hippo, MD;  [facility];  Service: Endoscopy;  Laterality: N/A;  [name] dilation  07/31/2011    Procedure: [name] DILATION;  Surgeon: Malissa Hippo, MD;  Location: [facility];  Service: Endoscopy;;  . Endometrial ablation    . Mass excision  05/20/2012    Procedure: EXCISION MASS;  Surgeon: Darreld Mclean, MD;  Location: [facility];  Service: Orthopedics;  Laterality: Left;  Repair of Fascial Defect Left Knee  . Carpal tunnel release  07/23/2012    Procedure: CARPAL TUNNEL RELEASE;  Surgeon: Darreld Mclean, MD;  Location: [facility];  Service: Orthopedics;  Laterality: Left;   Past Medical History  Diagnosis Date  . Asthma   . COPD (chronic obstructive pulmonary disease)   . Hypertension   . GERD (gastroesophageal reflux disease)   . Anxiety disorder   . Chronic pain syndrome   . Anxiety   . Depression   . Fibromyalgia    No Known Allergies      Assessment:    Solid food dysphagia. Esophageal stricture needs to be ruled out.    Plan:    EGD/ED.The risks and benefits such as perforation, bleeding, and infection were reviewed with the patient and is agreeable.

## 2012-09-24 NOTE — Patient Instructions (Addendum)
EGD/ED with Dr. Rehman. 

## 2012-09-28 ENCOUNTER — Encounter (HOSPITAL_COMMUNITY): Payer: Self-pay | Admitting: Pharmacy Technician

## 2012-09-30 ENCOUNTER — Ambulatory Visit (HOSPITAL_COMMUNITY)
Admission: RE | Admit: 2012-09-30 | Discharge: 2012-09-30 | Disposition: A | Discharge status: Home / Self Care | Payer: Medicaid Other | Source: Ambulatory Visit | Attending: Internal Medicine | Admitting: Internal Medicine

## 2012-09-30 ENCOUNTER — Encounter (HOSPITAL_COMMUNITY)
Admission: RE | Disposition: A | Discharge status: Home / Self Care | Payer: Self-pay | Source: Ambulatory Visit | Attending: Internal Medicine

## 2012-09-30 ENCOUNTER — Encounter (HOSPITAL_COMMUNITY): Payer: Self-pay | Admitting: *Deleted

## 2012-09-30 DIAGNOSIS — I10 Essential (primary) hypertension: Secondary | ICD-10-CM | POA: Insufficient documentation

## 2012-09-30 DIAGNOSIS — K222 Esophageal obstruction: Secondary | ICD-10-CM | POA: Insufficient documentation

## 2012-09-30 DIAGNOSIS — J449 Chronic obstructive pulmonary disease, unspecified: Secondary | ICD-10-CM | POA: Insufficient documentation

## 2012-09-30 DIAGNOSIS — K449 Diaphragmatic hernia without obstruction or gangrene: Secondary | ICD-10-CM | POA: Insufficient documentation

## 2012-09-30 DIAGNOSIS — R1310 Dysphagia, unspecified: Secondary | ICD-10-CM

## 2012-09-30 DIAGNOSIS — J4489 Other specified chronic obstructive pulmonary disease: Secondary | ICD-10-CM | POA: Insufficient documentation

## 2012-09-30 HISTORY — PX: ESOPHAGOGASTRODUODENOSCOPY (EGD) WITH ESOPHAGEAL DILATION: SHX5812

## 2012-09-30 SURGERY — ESOPHAGOGASTRODUODENOSCOPY (EGD) WITH ESOPHAGEAL DILATION
Anesthesia: Moderate Sedation

## 2012-09-30 MED ORDER — MIDAZOLAM HCL 5 MG/5ML IJ SOLN
INTRAMUSCULAR | Status: AC
  Filled 2012-09-30: qty 10

## 2012-09-30 MED ORDER — MIDAZOLAM HCL 5 MG/5ML IJ SOLN
INTRAMUSCULAR | Status: AC
  Filled 2012-09-30: qty 5

## 2012-09-30 MED ORDER — SODIUM CHLORIDE 0.9 % IV SOLN
INTRAVENOUS | Status: DC
  Administered 2012-09-30: 1000 mL via INTRAVENOUS

## 2012-09-30 MED ORDER — PROMETHAZINE HCL 25 MG/ML IJ SOLN
INTRAMUSCULAR | Status: DC | PRN
  Administered 2012-09-30: 12.5 mg via INTRAVENOUS

## 2012-09-30 MED ORDER — MEPERIDINE HCL 50 MG/ML IJ SOLN
INTRAMUSCULAR | Status: AC
  Filled 2012-09-30: qty 1

## 2012-09-30 MED ORDER — STERILE WATER FOR IRRIGATION IR SOLN
Status: DC | PRN
  Administered 2012-09-30: 15:00:00

## 2012-09-30 MED ORDER — MIDAZOLAM HCL 5 MG/5ML IJ SOLN
INTRAMUSCULAR | Status: DC | PRN
  Administered 2012-09-30: 3 mg via INTRAVENOUS
  Administered 2012-09-30: 4 mg via INTRAVENOUS
  Administered 2012-09-30 (×2): 3 mg via INTRAVENOUS

## 2012-09-30 MED ORDER — SODIUM CHLORIDE 0.9 % IJ SOLN
INTRAMUSCULAR | Status: AC
  Filled 2012-09-30: qty 10

## 2012-09-30 MED ORDER — BUTAMBEN-TETRACAINE-BENZOCAINE 2-2-14 % EX AERO
INHALATION_SPRAY | CUTANEOUS | Status: DC | PRN
  Administered 2012-09-30: 2 via TOPICAL

## 2012-09-30 MED ORDER — MEPERIDINE HCL 25 MG/ML IJ SOLN
INTRAMUSCULAR | Status: DC | PRN
  Administered 2012-09-30 (×2): 25 mg via INTRAVENOUS

## 2012-09-30 MED ORDER — PROMETHAZINE HCL 25 MG/ML IJ SOLN
INTRAMUSCULAR | Status: AC
  Filled 2012-09-30: qty 1

## 2012-09-30 NOTE — H&P (Signed)
[name] is an 47 y.o. female.   Chief Complaint: patient is here for EGD and ED. HPI: Patient is 47 year old Caucasian female who has chronic GERD with satisfactory control of heartburn with PPI who presents for 2-3 month history of solid food dysphagia. She points to her suprasternal notch as site of bolus obstruction. She is able to push it down water. 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 She says she did fine until 2 or 3 months ago. She has good appetite. She denies abdominal pain or melena.  Past Medical History  Diagnosis Date  . Asthma   . COPD (chronic obstructive pulmonary disease)   . Hypertension   . GERD (gastroesophageal reflux disease)   . Anxiety disorder   . Chronic pain syndrome   . Anxiety   . Depression   . Fibromyalgia     Past Surgical History  Procedure Laterality Date  . Cholecystectomy    . Cesarean section    . Carpal tunnel release    . Esophagogastroduodenoscopy  07/31/2011    Procedure: ESOPHAGOGASTRODUODENOSCOPY (EGD);  Surgeon: Malissa Hippo, MD;  [facility];  Service: Endoscopy;  Laterality: N/A;  Elease Hashimoto dilation  07/31/2011    Procedure: Elease Hashimoto DILATION;  Surgeon: Malissa Hippo, MD;  [facility];  Service: Endoscopy;;  . Endometrial ablation    . Mass excision  05/20/2012    Procedure: EXCISION MASS;  Surgeon: Darreld Mclean, MD;  Location: [facility];  Service: Orthopedics;  Laterality: Left;  Repair of Fascial Defect Left Knee  . Carpal tunnel release  07/23/2012    Procedure: CARPAL TUNNEL RELEASE;  Surgeon: Darreld Mclean, MD;  Location: [facility];  Service: Orthopedics;  Laterality: Left;    Family History  Problem Relation Age of Onset  . Asthma Mother   . Asthma Father   . Cancer Father    Social History:  reports that she quit smoking about 11 months ago. Her smoking use included Cigarettes. She smoked 0.00 packs per  day. She has never used smokeless tobacco. She reports that she drinks about 1.2 ounces of alcohol per week. She reports that she does not use illicit drugs.  Allergies: No Known Allergies  Medications Prior to Admission  Medication Sig Dispense Refill  . albuterol (PROVENTIL HFA;VENTOLIN HFA) 108 (90 BASE) MCG/ACT inhaler Inhale 2 puffs into the lungs every 6 (six) hours as needed for wheezing or shortness of breath. For shortness of breath  1 Inhaler  1  . albuterol (PROVENTIL) (2.5 MG/3ML) 0.083% nebulizer solution Take 2.5 mg by nebulization every 6 (six) hours as needed. For shortness of breath      . albuterol (PROVENTIL) 4 MG tablet Take 4 mg by mouth 3 (three) times daily.      Marland Kitchen amLODipine (NORVASC) 5 MG tablet Take 5 mg by mouth daily.        Marland Kitchen buPROPion (WELLBUTRIN XL) 300 MG 24 hr tablet Take 300 mg by mouth every morning.        . diazepam (VALIUM) 10 MG tablet Take 10 mg by mouth every 8 (eight) hours as needed for anxiety.      . fish oil-omega-3 fatty acids 1000 MG capsule Take 2 g by mouth daily.       . fluocinonide cream (LIDEX) 0.05 % Apply 1 application topically 4 (four) times daily.      Marland Kitchen  labetalol (NORMODYNE) 200 MG tablet Take 400 mg by mouth 2 (two) times daily.       Marland Kitchen lisinopril (PRINIVIL,ZESTRIL) 20 MG tablet Take 1 tablet (20 mg total) by mouth daily.  30 tablet  0  . oxyCODONE-acetaminophen (PERCOCET/ROXICET) 5-325 MG per tablet Take 1 tablet by mouth every 4 (four) hours as needed. Pain.      . pantoprazole (PROTONIX) 40 MG tablet Take 1 tablet (40 mg total) by mouth 2 (two) times daily before a meal.  30 tablet  1    No results found for this or any previous visit (from the past 48 hour(s)). No results found.  ROS  Blood pressure 175/97, pulse 65, temperature 97.6 F (36.4 C), temperature source Oral, resp. rate 22, height 5\' 4"  (1.626 m), weight 153 lb (69.4 kg), SpO2 96.00%. Physical Exam  Constitutional: She appears well-developed and well-nourished.   HENT:  Mouth/Throat: Oropharynx is clear and moist.  Eyes: Conjunctivae are normal. No scleral icterus.  Neck: No thyromegaly present.  Cardiovascular: Normal rate, regular rhythm and normal heart sounds.   No murmur heard. Respiratory: Effort normal and breath sounds normal.  GI: Soft. She exhibits no distension and no mass.  Musculoskeletal: She exhibits no edema.  Lymphadenopathy:    She has no cervical adenopathy.  Neurological: She is alert.  Skin: Skin is warm and dry.     Assessment/Plan Solid food dysphagia. Chronic GERD. EGD with ED.  Watt Geiler U 09/30/2012, 3:00 PM

## 2012-09-30 NOTE — Op Note (Signed)
EGD PROCEDURE REPORT  PATIENT:  [name]  MR#:  [id number] Birthdate:  11-Jan-1966, 47 y.o., female Endoscopist:  Dr. Malissa Hippo, MD Referred By:  Dr. [name], MD  Procedure Date: 09/30/2012  Procedure:   EGD with ED.  Indications:  Patient is 47 year old Caucasian female who presents for solid food dysphagia. She has chronic GERD and her heartburn is well controlled with therapy. She points to her suprasternal area side abortus obstruction. She has history of esophageal which was last dilated in January 2013 when she was also found to have distal esophageal ulcers possible pill esophagitis.            Informed Consent:  The risks, benefits, alternatives & imponderables which include, but are not limited to, bleeding, infection, perforation, drug reaction and potential missed lesion have been reviewed.  The potential for biopsy, lesion removal, esophageal dilation, etc. have also been discussed.  Questions have been answered.  All parties agreeable.  Please see history & physical in medical record for more information.  Medications:  Demerol 50 mg IV Versed 13 mg IV Promethazine 12.5 mg IV. Cetacaine spray topically for oropharyngeal anesthesia  Description of procedure:  The endoscope was introduced through the mouth and advanced to the second portion of the duodenum without difficulty or limitations. The mucosal surfaces were surveyed very carefully during advancement of the scope and upon withdrawal.  Findings:  Esophagus:  Mucosa of the esophagus was normal. GE junction was unremarkable without ring or stricture formation. GEJ:  35 cm Hiatus:  37 cm Stomach:  Stomach was empty and distended very well with insufflation. Folds in the proximal stomach are normal. Examination mucosa at body, antrum, pyloric channel, angularis, fundus and cardia was normal. Duodenum:  Normal bulbar and post bulbar mucosa.  Therapeutic/Diagnostic Maneuvers Performed:   Esophagus  dilated by passing 56 French Maloney dilator to full insertion. Esophageal mucosa was reexamined post dilation there was small boat shaped because of disruption and cervical esophagus indicative of a web.  Complications:  None  Impression: Small sliding hiatal hernia without ring or stricture formation. 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  Recommendations:  Continue anti-reflux measures and pantoprazole as before. Patient to call office in one week with progress report.   Haylin Camilli U  09/30/2012  3:28 PM  CC: Dr. Cassell Smiles., MD & Dr. Bonnetta Barry ref. provider found

## 2012-10-02 ENCOUNTER — Encounter (HOSPITAL_COMMUNITY): Payer: Self-pay | Admitting: Internal Medicine

## 2012-10-20 ENCOUNTER — Emergency Department (HOSPITAL_COMMUNITY): Payer: Medicaid Other

## 2012-10-20 ENCOUNTER — Observation Stay (HOSPITAL_COMMUNITY)
Admission: EM | Admit: 2012-10-20 | Discharge: 2012-10-21 | DRG: 192 | Disposition: A | Discharge status: Home / Self Care | Payer: Medicaid Other | Attending: Internal Medicine | Admitting: Internal Medicine

## 2012-10-20 ENCOUNTER — Encounter (HOSPITAL_COMMUNITY): Payer: Self-pay | Admitting: *Deleted

## 2012-10-20 DIAGNOSIS — E876 Hypokalemia: Secondary | ICD-10-CM

## 2012-10-20 DIAGNOSIS — J45909 Unspecified asthma, uncomplicated: Secondary | ICD-10-CM | POA: Diagnosis present

## 2012-10-20 DIAGNOSIS — Z809 Family history of malignant neoplasm, unspecified: Secondary | ICD-10-CM

## 2012-10-20 DIAGNOSIS — R1310 Dysphagia, unspecified: Secondary | ICD-10-CM

## 2012-10-20 DIAGNOSIS — I10 Essential (primary) hypertension: Secondary | ICD-10-CM | POA: Diagnosis present

## 2012-10-20 DIAGNOSIS — F329 Major depressive disorder, single episode, unspecified: Secondary | ICD-10-CM | POA: Diagnosis present

## 2012-10-20 DIAGNOSIS — F3289 Other specified depressive episodes: Secondary | ICD-10-CM | POA: Diagnosis present

## 2012-10-20 DIAGNOSIS — F411 Generalized anxiety disorder: Secondary | ICD-10-CM | POA: Diagnosis present

## 2012-10-20 DIAGNOSIS — K219 Gastro-esophageal reflux disease without esophagitis: Secondary | ICD-10-CM | POA: Diagnosis present

## 2012-10-20 DIAGNOSIS — Z825 Family history of asthma and other chronic lower respiratory diseases: Secondary | ICD-10-CM

## 2012-10-20 DIAGNOSIS — Z87891 Personal history of nicotine dependence: Secondary | ICD-10-CM

## 2012-10-20 DIAGNOSIS — R0789 Other chest pain: Secondary | ICD-10-CM | POA: Diagnosis present

## 2012-10-20 DIAGNOSIS — J45901 Unspecified asthma with (acute) exacerbation: Secondary | ICD-10-CM | POA: Diagnosis present

## 2012-10-20 DIAGNOSIS — Z79899 Other long term (current) drug therapy: Secondary | ICD-10-CM

## 2012-10-20 DIAGNOSIS — F419 Anxiety disorder, unspecified: Secondary | ICD-10-CM

## 2012-10-20 DIAGNOSIS — J441 Chronic obstructive pulmonary disease with (acute) exacerbation: Principal | ICD-10-CM | POA: Diagnosis present

## 2012-10-20 DIAGNOSIS — G894 Chronic pain syndrome: Secondary | ICD-10-CM | POA: Diagnosis present

## 2012-10-20 DIAGNOSIS — IMO0001 Reserved for inherently not codable concepts without codable children: Secondary | ICD-10-CM | POA: Diagnosis present

## 2012-10-20 DIAGNOSIS — J449 Chronic obstructive pulmonary disease, unspecified: Secondary | ICD-10-CM

## 2012-10-20 LAB — CBC WITH DIFFERENTIAL/PLATELET
Basophils Absolute: 0.1 10*3/uL (ref 0.0–0.1)
Eosinophils Absolute: 0.5 10*3/uL (ref 0.0–0.7)
Lymphs Abs: 2.2 10*3/uL (ref 0.7–4.0)
MCH: 32.4 pg (ref 26.0–34.0)
Neutrophils Relative %: 52 % (ref 43–77)
Platelets: 262 10*3/uL (ref 150–400)
RBC: 4.23 MIL/uL (ref 3.87–5.11)
RDW: 12.4 % (ref 11.5–15.5)
WBC: 6.7 10*3/uL (ref 4.0–10.5)

## 2012-10-20 LAB — BASIC METABOLIC PANEL
GFR calc Af Amer: >90 mL/min (ref >90)
GFR calc non Af Amer: >90 mL/min (ref >90)
Glucose, Bld: 92 mg/dL (ref 70–99)
Potassium: 3.9 mEq/L (ref 3.5–5.1)
Sodium: 137 mEq/L (ref 135–145)

## 2012-10-20 MED ORDER — LORAZEPAM 2 MG/ML IJ SOLN
1.0000 mg | Freq: Once | INTRAMUSCULAR | Status: AC
  Administered 2012-10-20: 1 mg via INTRAVENOUS
  Filled 2012-10-20: qty 1

## 2012-10-20 MED ORDER — OXYCODONE-ACETAMINOPHEN 5-325 MG PO TABS
1.0000 | ORAL_TABLET | ORAL | Status: DC | PRN
  Administered 2012-10-20 – 2012-10-21 (×4): 1 via ORAL
  Filled 2012-10-20 (×4): qty 1

## 2012-10-20 MED ORDER — ALBUTEROL SULFATE (5 MG/ML) 0.5% IN NEBU
2.5000 mg | INHALATION_SOLUTION | RESPIRATORY_TRACT | Status: DC | PRN
  Administered 2012-10-20 (×2): 2.5 mg via RESPIRATORY_TRACT
  Filled 2012-10-20: qty 0.5

## 2012-10-20 MED ORDER — HEPARIN SODIUM (PORCINE) 5000 UNIT/ML IJ SOLN
5000.0000 [IU] | Freq: Three times a day (TID) | INTRAMUSCULAR | Status: DC
  Administered 2012-10-20 – 2012-10-21 (×3): 5000 [IU] via SUBCUTANEOUS
  Filled 2012-10-20 (×3): qty 1

## 2012-10-20 MED ORDER — AMLODIPINE BESYLATE 5 MG PO TABS
5.0000 mg | ORAL_TABLET | Freq: Every day | ORAL | Status: DC
  Administered 2012-10-21: 5 mg via ORAL
  Filled 2012-10-20: qty 1

## 2012-10-20 MED ORDER — IBUPROFEN 800 MG PO TABS
800.0000 mg | ORAL_TABLET | Freq: Four times a day (QID) | ORAL | Status: DC | PRN
  Administered 2012-10-20 (×2): 800 mg via ORAL
  Filled 2012-10-20 (×2): qty 1

## 2012-10-20 MED ORDER — HYDROMORPHONE HCL PF 1 MG/ML IJ SOLN
0.5000 mg | INTRAMUSCULAR | Status: DC | PRN
  Administered 2012-10-20: 0.5 mg via INTRAVENOUS
  Administered 2012-10-21 (×2): 1 mg via INTRAVENOUS
  Filled 2012-10-20 (×3): qty 1

## 2012-10-20 MED ORDER — ALBUTEROL SULFATE (5 MG/ML) 0.5% IN NEBU
5.0000 mg | INHALATION_SOLUTION | Freq: Once | RESPIRATORY_TRACT | Status: AC
  Administered 2012-10-20: 5 mg via RESPIRATORY_TRACT
  Filled 2012-10-20: qty 1

## 2012-10-20 MED ORDER — LISINOPRIL 10 MG PO TABS
20.0000 mg | ORAL_TABLET | Freq: Every day | ORAL | Status: DC
  Administered 2012-10-21: 20 mg via ORAL
  Filled 2012-10-20: qty 2

## 2012-10-20 MED ORDER — IPRATROPIUM BROMIDE 0.02 % IN SOLN
RESPIRATORY_TRACT | Status: AC
  Administered 2012-10-20: 0.5 mg via RESPIRATORY_TRACT
  Filled 2012-10-20: qty 2.5

## 2012-10-20 MED ORDER — PANTOPRAZOLE SODIUM 40 MG PO TBEC
40.0000 mg | DELAYED_RELEASE_TABLET | Freq: Two times a day (BID) | ORAL | Status: DC
  Administered 2012-10-20 – 2012-10-21 (×2): 40 mg via ORAL
  Filled 2012-10-20 (×2): qty 1

## 2012-10-20 MED ORDER — MOMETASONE FURO-FORMOTEROL FUM 100-5 MCG/ACT IN AERO
2.0000 | INHALATION_SPRAY | Freq: Two times a day (BID) | RESPIRATORY_TRACT | Status: DC
  Filled 2012-10-20 (×3): qty 8.8

## 2012-10-20 MED ORDER — DIAZEPAM 5 MG PO TABS
10.0000 mg | ORAL_TABLET | Freq: Three times a day (TID) | ORAL | Status: DC | PRN
Start: 2012-10-20 — End: 2012-10-21
  Administered 2012-10-20 – 2012-10-21 (×2): 10 mg via ORAL
  Filled 2012-10-20 (×2): qty 2

## 2012-10-20 MED ORDER — GUAIFENESIN-DM 100-10 MG/5ML PO SYRP: 10.0000 mL | ORAL_SOLUTION | ORAL | Status: DC | PRN

## 2012-10-20 MED ORDER — METHYLPREDNISOLONE SODIUM SUCC 125 MG IJ SOLR
125.0000 mg | Freq: Once | INTRAMUSCULAR | Status: AC
  Administered 2012-10-20: 125 mg via INTRAVENOUS
  Filled 2012-10-20: qty 2

## 2012-10-20 MED ORDER — BUPROPION HCL ER (XL) 300 MG PO TB24
300.0000 mg | ORAL_TABLET | ORAL | Status: DC
  Administered 2012-10-21: 300 mg via ORAL
  Filled 2012-10-20 (×4): qty 1

## 2012-10-20 MED ORDER — ALBUTEROL SULFATE (5 MG/ML) 0.5% IN NEBU: 2.5000 mg | INHALATION_SOLUTION | Freq: Once | RESPIRATORY_TRACT | Status: AC

## 2012-10-20 MED ORDER — METHYLPREDNISOLONE SODIUM SUCC 125 MG IJ SOLR
80.0000 mg | Freq: Four times a day (QID) | INTRAMUSCULAR | Status: DC
  Administered 2012-10-20 – 2012-10-21 (×3): 80 mg via INTRAVENOUS
  Filled 2012-10-20 (×3): qty 2

## 2012-10-20 MED ORDER — OMEGA-3-ACID ETHYL ESTERS 1 G PO CAPS
2.0000 g | ORAL_CAPSULE | Freq: Every day | ORAL | Status: DC
  Administered 2012-10-21: 2 g via ORAL
  Filled 2012-10-20 (×3): qty 2

## 2012-10-20 MED ORDER — ALBUTEROL SULFATE (5 MG/ML) 0.5% IN NEBU
INHALATION_SOLUTION | RESPIRATORY_TRACT | Status: AC
  Administered 2012-10-20: 2.5 mg via RESPIRATORY_TRACT
  Filled 2012-10-20: qty 1

## 2012-10-20 MED ORDER — GUAIFENESIN-DM 100-10 MG/5ML PO SYRP
5.0000 mL | ORAL_SOLUTION | ORAL | Status: DC | PRN
  Filled 2012-10-20: qty 5

## 2012-10-20 MED ORDER — KETOROLAC TROMETHAMINE 30 MG/ML IJ SOLN
30.0000 mg | Freq: Once | INTRAMUSCULAR | Status: AC
  Administered 2012-10-20: 30 mg via INTRAVENOUS
  Filled 2012-10-20: qty 1

## 2012-10-20 MED ORDER — BENZONATATE 100 MG PO CAPS
200.0000 mg | ORAL_CAPSULE | Freq: Three times a day (TID) | ORAL | Status: DC | PRN
  Administered 2012-10-20 – 2012-10-21 (×2): 200 mg via ORAL
  Filled 2012-10-20 (×2): qty 2

## 2012-10-20 MED ORDER — DIAZEPAM 5 MG PO TABS: 5.0000 mg | ORAL_TABLET | Freq: Three times a day (TID) | ORAL | Status: DC | PRN

## 2012-10-20 MED ORDER — IPRATROPIUM BROMIDE 0.02 % IN SOLN
0.5000 mg | Freq: Once | RESPIRATORY_TRACT | Status: AC
  Administered 2012-10-20: 0.5 mg via RESPIRATORY_TRACT
  Filled 2012-10-20: qty 2.5

## 2012-10-20 MED ORDER — IPRATROPIUM BROMIDE 0.02 % IN SOLN: 0.5000 mg | Freq: Once | RESPIRATORY_TRACT | Status: AC

## 2012-10-20 NOTE — Progress Notes (Signed)
Pt c/o of rib pain at 8 of 10 after receiving Percocet approximately 1.5 hours ago.  Dr. Karilyn Cota notified via text page.

## 2012-10-20 NOTE — Clinical Social Work Note (Signed)
CSW received consult for grief/loss. CSW introduced self and pt asked why she was referred. CSW explained that consult was made to see if pt felt she could benefit from grief and loss support group or wanted to talk about recent loss. Pt very pleasant and appreciative of visit, but states that she has a lot of support from family, particularly her sister and does not feel she needs any other referrals. RN notified. CSW signing off but can be reconsulted if pt wishes.  Derenda Fennel, [place] [phone]

## 2012-10-20 NOTE — Progress Notes (Signed)
Pt has PRN Valium ordered.  States she takes Valium 10 mg four times a day at home.  Requesting current order of 5 mg be increased.  Dr. Karilyn Cota notified via text page.

## 2012-10-20 NOTE — H&P (Signed)
[facility] History and Physical  [name] NWG:[id number] DOB: 1965-10-01 DOA: 10/20/2012  Referring physician: Dr Ammie Dalton PCP: Cassell Smiles., MD    Chief Complaint: Dyspnoea   HPI: [name] is a 47 y.o. female  Who presents with 2 day h/o dyspnoea.Clear productive cough.No fever.Home nebs did not help.Quit smoking more than 6 months ago.W are now asked to admit.  Review of Systems:  Apart from HPI,no other symptoms  Past Medical History  Diagnosis Date  . Asthma   . COPD (chronic obstructive pulmonary disease)   . Hypertension   . GERD (gastroesophageal reflux disease)   . Anxiety disorder   . Chronic pain syndrome   . Anxiety   . Depression   . Fibromyalgia    Past Surgical History  Procedure Laterality Date  . Cholecystectomy    . Cesarean section    . Carpal tunnel release    . Esophagogastroduodenoscopy  07/31/2011    Procedure: ESOPHAGOGASTRODUODENOSCOPY (EGD);  Surgeon: Malissa Hippo, MD;  [facility];  Service: Endoscopy;  Laterality: N/A;  Elease Hashimoto dilation  07/31/2011    Procedure: Elease Hashimoto DILATION;  Surgeon: Malissa Hippo, MD;  [facility];  Service: Endoscopy;;  . Endometrial ablation    . Mass excision  05/20/2012    Procedure: EXCISION MASS;  Surgeon: Darreld Mclean, MD;  Location: [facility];  Service: Orthopedics;  Laterality: Left;  Repair of Fascial Defect Left Knee  . Carpal tunnel release  07/23/2012    Procedure: CARPAL TUNNEL RELEASE;  Surgeon: Darreld Mclean, MD;  Location: [facility];  Service: Orthopedics;  Laterality: Left;  . Esophagogastroduodenoscopy (egd) with esophageal dilation N/A 09/30/2012    Procedure: ESOPHAGOGASTRODUODENOSCOPY (EGD) WITH ESOPHAGEAL DILATION;  Surgeon: Malissa Hippo, MD;  Location: [facility];  Service: Endoscopy;  Laterality: N/A;  76   Social History:  reports that she quit smoking about a year ago. Her smoking use included Cigarettes. She smoked 0.00 packs per day. She has  never used smokeless tobacco. She reports that she drinks about 1.2 ounces of alcohol per week. She reports that she does not use illicit drugs. Lives with boyfriend.Unemployed.   No Known Allergies  Family History  Problem Relation Age of Onset  . Asthma Mother   . Asthma Father   . Cancer Father       Prior to Admission medications   Medication Sig Start Date End Date Taking? Authorizing Provider  albuterol (PROVENTIL HFA;VENTOLIN HFA) 108 (90 BASE) MCG/ACT inhaler Inhale 2 puffs into the lungs every 6 (six) hours as needed for wheezing or shortness of breath. For shortness of breath 07/31/11  Yes Erick Blinks, MD  albuterol (PROVENTIL) (2.5 MG/3ML) 0.083% nebulizer solution Take 2.5 mg by nebulization every 6 (six) hours as needed. For shortness of breath   Yes Historical Provider, MD  albuterol (PROVENTIL) 4 MG tablet Take 4 mg by mouth 3 (three) times daily.   Yes Historical Provider, MD  amLODipine (NORVASC) 5 MG tablet Take 5 mg by mouth daily.     Yes Historical Provider, MD  buPROPion (WELLBUTRIN XL) 300 MG 24 hr tablet Take 300 mg by mouth every morning.     Yes Historical Provider, MD  diazepam (VALIUM) 5 MG tablet Take 5 mg by mouth every 8 (eight) hours as needed for anxiety.   Yes Historical Provider, MD  fish oil-omega-3 fatty acids 1000 MG capsule Take 2 g by mouth daily.    Yes Historical Provider, MD  labetalol (NORMODYNE) 200 MG tablet Take 400 mg by mouth 2 (two) times daily.    Yes Historical Provider, MD  lisinopril (PRINIVIL,ZESTRIL) 20 MG tablet Take 1 tablet (20 mg total) by mouth daily. 07/31/11  Yes Erick Blinks, MD  oxyCODONE-acetaminophen (PERCOCET/ROXICET) 5-325 MG per tablet Take 1 tablet by mouth every 4 (four) hours as needed. Pain.   Yes Historical Provider, MD  pantoprazole (PROTONIX) 40 MG tablet Take 1 tablet (40 mg total) by mouth 2 (two) times daily before a meal. 07/31/11  Yes Erick Blinks, MD   Physical Exam: Filed Vitals:   10/20/12 4098  10/20/12 0840 10/20/12 0951 10/20/12 0957  BP: 154/107   139/79  Pulse: 87   73  Temp: 97.5 F (36.4 C)     TempSrc: Oral     Resp: 24   20  Height: 5\' 4"  (1.626 m)     Weight: 69.4 kg (153 lb)     SpO2: 98% 100% 100% 100%     General:  Looks well,no increase work of breathing at rest.  Eyes: WNL  ENT: WNL  Neck: No lymphedenomapthy  Cardiovascular: WNL  Respiratory: Bilateral wheezing,somewhat tight.  Abdomen: SOFT,NON-TENDER.  Skin: No rash  Musculoskeletal: WNL  Psychiatric: Appropriate affect  Neurologic: Alert/oreintated,non-focal.  Labs on Admission:  Basic Metabolic Panel:  Recent Labs Lab 10/20/12 0913  NA 137  K 3.9  CL 101  CO2 24  GLUCOSE 92  BUN 9  CREATININE 0.64  CALCIUM 9.5       CBC:  Recent Labs Lab 10/20/12 0913  WBC 6.7  NEUTROABS 3.5  HGB 13.7  HCT 41.1  MCV 97.2  PLT 262    Radiological Exams on Admission: Dg Chest Port 1 View  10/20/2012  *RADIOLOGY REPORT*  Clinical Data:  Shortness of breath.  CHEST - 1 VIEW  Comparison:  05/14/2012  Findings: The heart size and mediastinal contours are within normal limits.  Both lungs are clear except for a vague nodular density at the right lung base.  Bony thorax is intact.  The trachea is midline.  IMPRESSION:  No acute cardiopulmonary disease.  Vague density at the right lung base may represent a nipple shadow. This finding could be confirmed with a follow-up image with nipple markers.   Original Report Authenticated By: Richarda Overlie, M.D.       Assessment/Plan Active Problems:   Hypertension   Asthma   COPD exacerbation   Musculoskeletal chest pain   1. COPD exac. With asthma      2. HTN 3. Musculoskeletal chest pain                                                              g  PLAN: 1.ADMIT 2.iv steroids,brochodilators.No need for antibiotics at present.    Code Status: FULL  Family Communication: Discussed plan with patient   Disposition Plan: Home when  medically stable.   Time spent: 30 mins  Wilson Singer [facility] Pager [phone]  If 7PM-7AM, please contact night-coverage [email or web address] Password [facility] 10/20/2012, 11:29 AM

## 2012-10-20 NOTE — ED Provider Notes (Signed)
History     This chart was scribed for Donnetta Hutching, MD, MD by Smitty Pluck, ED Scribe. The patient was seen in room APA09/[facility]09 and the patient's care was started at 9:06 AM.   CSN: [id number]  Arrival date & time 10/20/12  7829      Chief Complaint  Patient presents with  . Asthma  . Wheezing     The history is provided by the patient. No language interpreter was used.   [name] is a 47 y.o. female with h/o HTN, asthma and COPD who presents to the Emergency Department complaining of constant, moderate SOB onset 2 days ago. Pt reports that she used albuterol inhaler and breathing machine at home without relief. She states that she gets steroid shot monthly at PCP. She states she has associated chest pain and cough. Pt denies fever, chills, nausea, vomiting, diarrhea, weakness and any other pain.   PCP is Dr. Sherwood Gambler   Past Medical History  Diagnosis Date  . Asthma   . COPD (chronic obstructive pulmonary disease)   . Hypertension   . GERD (gastroesophageal reflux disease)   . Anxiety disorder   . Chronic pain syndrome   . Anxiety   . Depression   . Fibromyalgia     Past Surgical History  Procedure Laterality Date  . Cholecystectomy    . Cesarean section    . Carpal tunnel release    . Esophagogastroduodenoscopy  07/31/2011    Procedure: ESOPHAGOGASTRODUODENOSCOPY (EGD);  Surgeon: Malissa Hippo, MD;  [facility]: [facility];  Service: Endoscopy;  Laterality: N/A;  Elease Hashimoto dilation  07/31/2011    Procedure: Elease Hashimoto DILATION;  Surgeon: Malissa Hippo, MD;  Location: [facility];  Service: Endoscopy;;  . Endometrial ablation    . Mass excision  05/20/2012    Procedure: EXCISION MASS;  Surgeon: Darreld Mclean, MD;  Location: [facility];  Service: Orthopedics;  Laterality: Left;  Repair of Fascial Defect Left Knee  . Carpal tunnel release  07/23/2012    Procedure: CARPAL TUNNEL RELEASE;  Surgeon: Darreld Mclean, MD;  Location: [facility];  Service: Orthopedics;  Laterality:  Left;  . Esophagogastroduodenoscopy (egd) with esophageal dilation N/A 09/30/2012    Procedure: ESOPHAGOGASTRODUODENOSCOPY (EGD) WITH ESOPHAGEAL DILATION;  Surgeon: Malissa Hippo, MD;  [facility]: [facility];  Service: Endoscopy;  Laterality: N/A;  245    Family History  Problem Relation Age of Onset  . Asthma Mother   . Asthma Father   . Cancer Father     History  Substance Use Topics  . Smoking status: Former Smoker    Types: Cigarettes    Quit date: 10/26/2011  . Smokeless tobacco: Never Used  . Alcohol Use: 1.2 oz/week    2 Cans of beer per week     Comment: social    OB History   Grav Para Term Preterm Abortions TAB SAB Ect Mult Living                  Review of Systems 10 Systems reviewed and all are negative for acute change except as noted in the HPI.   Allergies  Review of patient's allergies indicates no known allergies.  Home Medications   Current Outpatient Rx  Name  Route  Sig  Dispense  Refill  . albuterol (PROVENTIL HFA;VENTOLIN HFA) 108 (90 BASE) MCG/ACT inhaler   Inhalation   Inhale 2 puffs into the lungs every 6 (six) hours as needed for wheezing or shortness of breath.  For shortness of breath   1 Inhaler   1   . albuterol (PROVENTIL) (2.5 MG/3ML) 0.083% nebulizer solution   Nebulization   Take 2.5 mg by nebulization every 6 (six) hours as needed. For shortness of breath         . albuterol (PROVENTIL) 4 MG tablet   Oral   Take 4 mg by mouth 3 (three) times daily.         Marland Kitchen amLODipine (NORVASC) 5 MG tablet   Oral   Take 5 mg by mouth daily.           Marland Kitchen buPROPion (WELLBUTRIN XL) 300 MG 24 hr tablet   Oral   Take 300 mg by mouth every morning.           . diazepam (VALIUM) 10 MG tablet   Oral   Take 10 mg by mouth every 8 (eight) hours as needed for anxiety.         . fish oil-omega-3 fatty acids 1000 MG capsule   Oral   Take 2 g by mouth daily.          . fluocinonide cream (LIDEX) 0.05 %   Topical   Apply 1  application topically 4 (four) times daily.         Marland Kitchen labetalol (NORMODYNE) 200 MG tablet   Oral   Take 400 mg by mouth 2 (two) times daily.          Marland Kitchen EXPIRED: lisinopril (PRINIVIL,ZESTRIL) 20 MG tablet   Oral   Take 1 tablet (20 mg total) by mouth daily.   30 tablet   0   . oxyCODONE-acetaminophen (PERCOCET/ROXICET) 5-325 MG per tablet   Oral   Take 1 tablet by mouth every 4 (four) hours as needed. Pain.         Marland Kitchen EXPIRED: pantoprazole (PROTONIX) 40 MG tablet   Oral   Take 1 tablet (40 mg total) by mouth 2 (two) times daily before a meal.   30 tablet   1     BP 154/107  Pulse 87  Temp(Src) 97.5 F (36.4 C) (Oral)  Resp 24  Ht 5\' 4"  (1.626 m)  Wt 153 lb (69.4 kg)  BMI 26.25 kg/m2  SpO2 98%  Physical Exam  Nursing note and vitals reviewed. Constitutional: She is oriented to person, place, and time. She appears well-developed and well-nourished.  HENT:  Head: Normocephalic and atraumatic.  Eyes: Conjunctivae and EOM are normal. Pupils are equal, round, and reactive to light.  Neck: Normal range of motion. Neck supple.  Cardiovascular: Normal rate, regular rhythm and normal heart sounds.   Pulmonary/Chest: Effort normal. She has wheezes (bilateral expiratory ).  Abdominal: Soft. Bowel sounds are normal.  Musculoskeletal: Normal range of motion.  Neurological: She is alert and oriented to person, place, and time.  Skin: Skin is warm and dry.  Psychiatric: She has a normal mood and affect.    ED Course  Procedures (including critical care time) DIAGNOSTIC STUDIES: Oxygen Saturation is 98% on room air, normal by my interpretation.    COORDINATION OF CARE: 9:07 AM Discussed ED treatment with pt and pt agrees xray, breathing treatment and pain medication.  9:07 AM Ordered:  Medications  albuterol (PROVENTIL) (5 MG/ML) 0.5% nebulizer solution 2.5 mg (2.5 mg Nebulization Given 10/20/12 0840)  ipratropium (ATROVENT) nebulizer solution 0.5 mg (0.5 mg Nebulization  Given 10/20/12 0840)    Results for orders placed during the hospital encounter of 10/20/12  CBC WITH DIFFERENTIAL  Result Value Range   WBC 6.7  4.0 - 10.5 K/uL   RBC 4.23  3.87 - 5.11 MIL/uL   Hemoglobin 13.7  12.0 - 15.0 g/dL   HCT 16.1  09.6 - 04.5 %   MCV 97.2  78.0 - 100.0 fL   MCH 32.4  26.0 - 34.0 pg   MCHC 33.3  30.0 - 36.0 g/dL   RDW 40.9  81.1 - 91.4 %   Platelets 262  150 - 400 K/uL   Neutrophils Relative 52  43 - 77 %   Neutro Abs 3.5  1.7 - 7.7 K/uL   Lymphocytes Relative 33  12 - 46 %   Lymphs Abs 2.2  0.7 - 4.0 K/uL   Monocytes Relative 7  3 - 12 %   Monocytes Absolute 0.5  0.1 - 1.0 K/uL   Eosinophils Relative 7 (*) 0 - 5 %   Eosinophils Absolute 0.5  0.0 - 0.7 K/uL   Basophils Relative 1  0 - 1 %   Basophils Absolute 0.1  0.0 - 0.1 K/uL  BASIC METABOLIC PANEL      Result Value Range   Sodium 137  135 - 145 mEq/L   Potassium 3.9  3.5 - 5.1 mEq/L   Chloride 101  96 - 112 mEq/L   CO2 24  19 - 32 mEq/L   Glucose, Bld 92  70 - 99 mg/dL   BUN 9  6 - 23 mg/dL   Creatinine, Ser 7.82  0.50 - 1.10 mg/dL   Calcium 9.5  8.4 - 95.6 mg/dL   GFR calc non Af Amer >90  >90 mL/min   GFR calc Af Amer >90  >90 mL/min    Dg Chest Port 1 View  10/20/2012  *RADIOLOGY REPORT*  Clinical Data:  Shortness of breath.  CHEST - 1 VIEW  Comparison:  05/14/2012  Findings: The heart size and mediastinal contours are within normal limits.  Both lungs are clear except for a vague nodular density at the right lung base.  Bony thorax is intact.  The trachea is midline.  IMPRESSION:  No acute cardiopulmonary disease.  Vague density at the right lung base may represent a nipple shadow. This finding could be confirmed with a follow-up image with nipple markers.   Original Report Authenticated By: Richarda Overlie, M.D.      No diagnosis found.  CRITICAL CARE Performed by: Donnetta Hutching  ?  Total critical care time: 30  Critical care time was exclusive of separately billable procedures and  treating other patients.  Critical care was necessary to treat or prevent imminent or life-threatening deterioration.  Critical care was time spent personally by me on the following activities: development of treatment plan with patient and/or surrogate as well as nursing, discussions with consultants, evaluation of patient's response to treatment, examination of patient, obtaining history from patient or surrogate, ordering and performing treatments and interventions, ordering and review of laboratory studies, ordering and review of radiographic studies, pulse oximetry and re-evaluation of patient's condition.  MDM  Long-term asthmatic with failed treatment for asthma/COPD at home.  Chest x-ray shows no pneumonia.  Rx breathing treatments, IV steroids, admit      I personally performed the services described in this documentation, which was scribed in my presence. The recorded information has been reviewed and is accurate.    Donnetta Hutching, MD 10/20/12 1147

## 2012-10-20 NOTE — ED Notes (Addendum)
Pt c/o difficulty breathing/coughing X 2 days. Pt reports little relief w/ inhaler/breathing machine at home. 100% on RA. Wheezing auscultated in all lobes.

## 2012-10-21 DIAGNOSIS — G894 Chronic pain syndrome: Secondary | ICD-10-CM

## 2012-10-21 MED ORDER — BUPROPION HCL ER (XL) 150 MG PO TB24
ORAL_TABLET | ORAL | Status: AC
  Filled 2012-10-21: qty 2

## 2012-10-21 MED ORDER — SENNOSIDES-DOCUSATE SODIUM 8.6-50 MG PO TABS
1.0000 | ORAL_TABLET | Freq: Two times a day (BID) | ORAL | Status: DC
  Administered 2012-10-21: 1 via ORAL
  Filled 2012-10-21: qty 1

## 2012-10-21 MED ORDER — PREDNISONE 20 MG PO TABS: ORAL_TABLET | ORAL | Status: DC

## 2012-10-21 MED ORDER — ALBUTEROL SULFATE (5 MG/ML) 0.5% IN NEBU
2.5000 mg | INHALATION_SOLUTION | Freq: Four times a day (QID) | RESPIRATORY_TRACT | Status: DC
  Administered 2012-10-21: 2.5 mg via RESPIRATORY_TRACT
  Filled 2012-10-21: qty 0.5

## 2012-10-21 MED ORDER — ALBUTEROL SULFATE (5 MG/ML) 0.5% IN NEBU: 2.5000 mg | INHALATION_SOLUTION | Freq: Four times a day (QID) | RESPIRATORY_TRACT | Status: DC

## 2012-10-21 MED ORDER — MOMETASONE FURO-FORMOTEROL FUM 100-5 MCG/ACT IN AERO: 2.0000 | INHALATION_SPRAY | Freq: Two times a day (BID) | RESPIRATORY_TRACT | Status: DC

## 2012-10-21 MED ORDER — ONDANSETRON HCL 4 MG PO TABS: 4.0000 mg | ORAL_TABLET | Freq: Three times a day (TID) | ORAL | Status: DC | PRN

## 2012-10-21 NOTE — Discharge Summary (Signed)
Physician Discharge Summary  [name]:[id number] DOB: 07-Apr-1966 DOA: 10/20/2012  PCP: Cassell Smiles., MD  Admit date: 10/20/2012 Discharge date: 10/21/2012  Time spent: Greater than 30 minutes  Recommendations for Outpatient Follow-up:  1. Follow with primary care physician in the next week or so.   Discharge Diagnoses:  1. COPD exacerbation. Improved. 2. Asthma. 3. Musculoskeletal chest pain. 4. Hypertension.   Discharge Condition: Stable.  Diet recommendation: Regular.  Filed Weights   10/20/12 0833  Weight: 69.4 kg (153 lb)    History of present illness:  This 47 year old lady presents to the hospital with symptoms of dyspnea. She had a mostly nonproductive cough. Her symptoms have been going on for 2 days. There was no fever. She quit smoking 6 months ago.  Hospital Course:  Patient was admitted and started on intravenous steroids and bronchodilators. She has done well with this and her wheezing is still present but improved. Her chest is not as tight. She did have some nausea which may be reflective of her chronic cough. Although I think that her cough is likely secondary to her COPD/asthma, lisinopril may be also a cause and it needs to be considered when she sees her primary care physician in the outpatient setting. She is stable for discharge now.  Procedures:  None.  Consultations:  None.  Discharge Exam: Filed Vitals:   10/20/12 2033 10/20/12 2241 10/21/12 0649 10/21/12 0743  BP:  143/83 158/94   Pulse:  71 82   Temp:  97.8 F (36.6 C) 97.8 F (36.6 C)   TempSrc:  Oral Oral   Resp:  20 20   Height:      Weight:      SpO2: 95% 97% 95% 96%    General: She looks systemically well. There is no increased work of breathing. Cardiovascular: Heart sounds are present and normal without murmurs or added sounds. Respiratory: Lung fields show bilateral wheezing but not tight. There are no crackles or bronchial breathing. She is alert and  orientated  Discharge Instructions  Discharge Orders   Future Orders Complete By Expires     Diet - low sodium heart healthy  As directed     Increase activity slowly  As directed         Medication List    STOP taking these medications       albuterol 4 MG tablet  Commonly known as:  PROVENTIL     labetalol 200 MG tablet  Commonly known as:  NORMODYNE      TAKE these medications       albuterol (2.5 MG/3ML) 0.083% nebulizer solution  Commonly known as:  PROVENTIL  Take 2.5 mg by nebulization every 6 (six) hours as needed. For shortness of breath     albuterol 108 (90 BASE) MCG/ACT inhaler  Commonly known as:  PROVENTIL HFA;VENTOLIN HFA  Inhale 2 puffs into the lungs every 6 (six) hours as needed for wheezing or shortness of breath. For shortness of breath     amLODipine 5 MG tablet  Commonly known as:  NORVASC  Take 5 mg by mouth daily.     buPROPion 300 MG 24 hr tablet  Commonly known as:  WELLBUTRIN XL  Take 300 mg by mouth every morning.     diazepam 5 MG tablet  Commonly known as:  VALIUM  Take 5 mg by mouth every 8 (eight) hours as needed for anxiety.     fish oil-omega-3 fatty acids 1000 MG capsule  Take 2  g by mouth daily.     lisinopril 20 MG tablet  Commonly known as:  PRINIVIL,ZESTRIL  Take 1 tablet (20 mg total) by mouth daily.     mometasone-formoterol 100-5 MCG/ACT Aero  Commonly known as:  DULERA  Inhale 2 puffs into the lungs 2 (two) times daily.     ondansetron 4 MG tablet  Commonly known as:  ZOFRAN  Take 1 tablet (4 mg total) by mouth every 8 (eight) hours as needed for nausea.     oxyCODONE-acetaminophen 5-325 MG per tablet  Commonly known as:  PERCOCET/ROXICET  Take 1 tablet by mouth every 4 (four) hours as needed. Pain.     pantoprazole 40 MG tablet  Commonly known as:  PROTONIX  Take 1 tablet (40 mg total) by mouth 2 (two) times daily before a meal.     predniSONE 20 MG tablet  Commonly known as:  DELTASONE  Take 2 tablets  daily for 3 days, then 1 tablet daily for 3 days, then half tablet daily for 3 days, then STOP          The results of significant diagnostics from this hospitalization (including imaging, microbiology, ancillary and laboratory) are listed below for reference.    Significant Diagnostic Studies: Dg Chest Port 1 View  10/20/2012  *RADIOLOGY REPORT*  Clinical Data:  Shortness of breath.  CHEST - 1 VIEW  Comparison:  05/14/2012  Findings: The heart size and mediastinal contours are within normal limits.  Both lungs are clear except for a vague nodular density at the right lung base.  Bony thorax is intact.  The trachea is midline.  IMPRESSION:  No acute cardiopulmonary disease.  Vague density at the right lung base may represent a nipple shadow. This finding could be confirmed with a follow-up image with nipple markers.   Original Report Authenticated By: Richarda Overlie, M.D.         Labs: Basic Metabolic Panel:  Recent Labs Lab 10/20/12 0913  NA 137  K 3.9  CL 101  CO2 24  GLUCOSE 92  BUN 9  CREATININE 0.64  CALCIUM 9.5      CBC:  Recent Labs Lab 10/20/12 0913  WBC 6.7  NEUTROABS 3.5  HGB 13.7  HCT 41.1  MCV 97.2  PLT 262        Signed:  Kharee Lesesne C  [facility] 10/21/2012, 10:52 AM

## 2012-10-21 NOTE — Progress Notes (Signed)
IV removed, site WNL.  Pt given d/c instructions and new prescriptions.  Discussed home care with patient and discussed home medications, patient verbalizes understanding, teachback completed. F/U appointment in place with Dr Sherwood Gambler, pt states they will keep appointment. Pt is stable at this time.  OK per MD to drive herself home.

## 2012-10-28 NOTE — Progress Notes (Signed)
UR Chart Review Completed  

## 2013-01-18 ENCOUNTER — Ambulatory Visit: Payer: Medicaid Other | Admitting: Neurology

## 2013-02-16 ENCOUNTER — Emergency Department (HOSPITAL_COMMUNITY)
Admission: EM | Admit: 2013-02-16 | Discharge: 2013-02-16 | Disposition: A | Discharge status: Home / Self Care | Payer: Medicaid Other | Attending: Emergency Medicine | Admitting: Emergency Medicine

## 2013-02-16 ENCOUNTER — Encounter (HOSPITAL_COMMUNITY): Payer: Self-pay

## 2013-02-16 ENCOUNTER — Emergency Department (HOSPITAL_COMMUNITY): Payer: Medicaid Other

## 2013-02-16 DIAGNOSIS — IMO0002 Reserved for concepts with insufficient information to code with codable children: Secondary | ICD-10-CM | POA: Insufficient documentation

## 2013-02-16 DIAGNOSIS — F411 Generalized anxiety disorder: Secondary | ICD-10-CM | POA: Insufficient documentation

## 2013-02-16 DIAGNOSIS — R197 Diarrhea, unspecified: Secondary | ICD-10-CM | POA: Insufficient documentation

## 2013-02-16 DIAGNOSIS — Z9089 Acquired absence of other organs: Secondary | ICD-10-CM | POA: Insufficient documentation

## 2013-02-16 DIAGNOSIS — Z87891 Personal history of nicotine dependence: Secondary | ICD-10-CM | POA: Insufficient documentation

## 2013-02-16 DIAGNOSIS — J4489 Other specified chronic obstructive pulmonary disease: Secondary | ICD-10-CM | POA: Insufficient documentation

## 2013-02-16 DIAGNOSIS — I10 Essential (primary) hypertension: Secondary | ICD-10-CM | POA: Insufficient documentation

## 2013-02-16 DIAGNOSIS — Z79899 Other long term (current) drug therapy: Secondary | ICD-10-CM | POA: Insufficient documentation

## 2013-02-16 DIAGNOSIS — Z8669 Personal history of other diseases of the nervous system and sense organs: Secondary | ICD-10-CM | POA: Insufficient documentation

## 2013-02-16 DIAGNOSIS — K219 Gastro-esophageal reflux disease without esophagitis: Secondary | ICD-10-CM | POA: Insufficient documentation

## 2013-02-16 DIAGNOSIS — J449 Chronic obstructive pulmonary disease, unspecified: Secondary | ICD-10-CM | POA: Insufficient documentation

## 2013-02-16 DIAGNOSIS — R109 Unspecified abdominal pain: Secondary | ICD-10-CM | POA: Insufficient documentation

## 2013-02-16 DIAGNOSIS — Z8659 Personal history of other mental and behavioral disorders: Secondary | ICD-10-CM | POA: Insufficient documentation

## 2013-02-16 LAB — CBC WITH DIFFERENTIAL/PLATELET
HCT: 37.8 % (ref 36.0–46.0)
Hemoglobin: 12.9 g/dL (ref 12.0–15.0)
Lymphocytes Relative: 41 % (ref 12–46)
Lymphs Abs: 2.4 10*3/uL (ref 0.7–4.0)
Monocytes Absolute: 0.5 10*3/uL (ref 0.1–1.0)
Monocytes Relative: 9 % (ref 3–12)
Neutro Abs: 2.6 10*3/uL (ref 1.7–7.7)
Neutrophils Relative %: 44 % (ref 43–77)
RBC: 3.83 MIL/uL — ABNORMAL LOW (ref 3.87–5.11)
WBC: 5.8 10*3/uL (ref 4.0–10.5)

## 2013-02-16 LAB — COMPREHENSIVE METABOLIC PANEL
ALT: 17 U/L (ref 0–35)
AST: 21 U/L (ref 0–37)
Calcium: 9.6 mg/dL (ref 8.4–10.5)
GFR calc Af Amer: >90 mL/min (ref >90)
Glucose, Bld: 83 mg/dL (ref 70–99)
Total Protein: 7.6 g/dL (ref 6.0–8.3)

## 2013-02-16 MED ORDER — IPRATROPIUM BROMIDE 0.02 % IN SOLN
0.5000 mg | Freq: Once | RESPIRATORY_TRACT | Status: AC
  Administered 2013-02-16: 0.5 mg via RESPIRATORY_TRACT
  Filled 2013-02-16: qty 2.5

## 2013-02-16 MED ORDER — ALBUTEROL SULFATE (5 MG/ML) 0.5% IN NEBU
2.5000 mg | INHALATION_SOLUTION | Freq: Once | RESPIRATORY_TRACT | Status: AC
  Administered 2013-02-16: 2.5 mg via RESPIRATORY_TRACT
  Filled 2013-02-16: qty 0.5

## 2013-02-16 MED ORDER — SUCRALFATE 1 GM/10ML PO SUSP: 1.0000 g | Freq: Four times a day (QID) | ORAL | Status: DC

## 2013-02-16 MED ORDER — FAMOTIDINE IN NACL 20-0.9 MG/50ML-% IV SOLN
20.0000 mg | Freq: Once | INTRAVENOUS | Status: AC
  Administered 2013-02-16: 20 mg via INTRAVENOUS
  Filled 2013-02-16: qty 50

## 2013-02-16 MED ORDER — PANTOPRAZOLE SODIUM 20 MG PO TBEC: 20.0000 mg | DELAYED_RELEASE_TABLET | Freq: Two times a day (BID) | ORAL | Status: DC

## 2013-02-16 MED ORDER — MORPHINE SULFATE 4 MG/ML IJ SOLN
4.0000 mg | Freq: Once | INTRAMUSCULAR | Status: AC
  Administered 2013-02-16: 4 mg via INTRAVENOUS
  Filled 2013-02-16: qty 1

## 2013-02-16 MED ORDER — SODIUM CHLORIDE 0.9 % IV SOLN
INTRAVENOUS | Status: DC
  Administered 2013-02-16: 13:00:00 via INTRAVENOUS

## 2013-02-16 MED ORDER — ONDANSETRON HCL 4 MG/2ML IJ SOLN
4.0000 mg | Freq: Once | INTRAMUSCULAR | Status: AC
  Administered 2013-02-16: 4 mg via INTRAVENOUS
  Filled 2013-02-16: qty 2

## 2013-02-16 NOTE — ED Notes (Signed)
Attempting to contact ride home.

## 2013-02-16 NOTE — ED Notes (Signed)
States that she went out to eat on Sunday and started having mid periumbilical abdominal pain and diarrhea.  States that the pain has gotten gradually worse since then.

## 2013-02-16 NOTE — ED Provider Notes (Signed)
CSN: [id number]     Arrival date & time 02/16/13  1204 History     First MD Initiated Contact with Patient 02/16/13 1216     Chief Complaint  Patient presents with  . Abdominal Pain   (Consider location/radiation/quality/duration/timing/severity/associated sxs/prior Treatment) HPI Comments: Patient presents to the ER for evaluation of 2 days of severe upper abdominal pain. Patient reports that the pain significantly worsens after she eats, that she has diarrhea. She has not had any nausea or vomiting. Patient denies fever. There have been no urinary symptoms. This has been trying Midol for the pain but it has not been helping. She has never had similar symptoms. She is status post cholecystectomy.  Patient is a 47 y.o. female presenting with abdominal pain.  Abdominal Pain Associated symptoms: diarrhea   Associated symptoms: no fever     Past Medical History  Diagnosis Date  . Asthma   . COPD (chronic obstructive pulmonary disease)   . Hypertension   . GERD (gastroesophageal reflux disease)   . Anxiety disorder   . Chronic pain syndrome   . Anxiety   . Depression   . Fibromyalgia    Past Surgical History  Procedure Laterality Date  . Cholecystectomy    . Cesarean section    . Carpal tunnel release    . Esophagogastroduodenoscopy  07/31/2011    Procedure: ESOPHAGOGASTRODUODENOSCOPY (EGD);  Surgeon: Malissa Hippo, MD;  [facility];  Service: Endoscopy;  Laterality: N/A;  Elease Hashimoto dilation  07/31/2011    Procedure: Elease Hashimoto DILATION;  Surgeon: Malissa Hippo, MD;  [facility];  Service: Endoscopy;;  . Endometrial ablation    . Mass excision  05/20/2012    Procedure: EXCISION MASS;  Surgeon: Darreld Mclean, MD;  Location: [facility];  Service: Orthopedics;  Laterality: Left;  Repair of Fascial Defect Left Knee  . Carpal tunnel release  07/23/2012    Procedure: CARPAL TUNNEL RELEASE;  Surgeon: Darreld Mclean, MD;  Location: [facility];  Service: Orthopedics;   Laterality: Left;  . Esophagogastroduodenoscopy (egd) with esophageal dilation N/A 09/30/2012    Procedure: ESOPHAGOGASTRODUODENOSCOPY (EGD) WITH ESOPHAGEAL DILATION;  Surgeon: Malissa Hippo, MD;  [facility];  Service: Endoscopy;  Laterality: N/A;  245   Family History  Problem Relation Age of Onset  . Asthma Mother   . Asthma Father   . Cancer Father    History  Substance Use Topics  . Smoking status: Former Smoker    Types: Cigarettes    Quit date: 10/26/2011  . Smokeless tobacco: Never Used  . Alcohol Use: 1.2 oz/week    2 Cans of beer per week     Comment: social   OB History   Grav Para Term Preterm Abortions TAB SAB Ect Mult Living                 Review of Systems  Constitutional: Negative for fever.  Gastrointestinal: Positive for abdominal pain and diarrhea.  All other systems reviewed and are negative.    Allergies  Review of patient's allergies indicates no known allergies.  Home Medications   Current Outpatient Rx  Name  Route  Sig  Dispense  Refill  . albuterol (PROVENTIL HFA;VENTOLIN HFA) 108 (90 BASE) MCG/ACT inhaler   Inhalation   Inhale 2 puffs into the lungs every 6 (six) hours as needed for wheezing or shortness of breath. For shortness of breath   1 Inhaler   1   . albuterol (PROVENTIL) (2.5  MG/3ML) 0.083% nebulizer solution   Nebulization   Take 2.5 mg by nebulization every 6 (six) hours as needed. For shortness of breath         . amLODipine (NORVASC) 5 MG tablet   Oral   Take 5 mg by mouth daily.           Marland Kitchen buPROPion (WELLBUTRIN XL) 300 MG 24 hr tablet   Oral   Take 300 mg by mouth every morning.           . diazepam (VALIUM) 5 MG tablet   Oral   Take 5 mg by mouth every 8 (eight) hours as needed for anxiety.         . fish oil-omega-3 fatty acids 1000 MG capsule   Oral   Take 2 g by mouth daily.          Marland Kitchen lisinopril (PRINIVIL,ZESTRIL) 20 MG tablet   Oral   Take 1 tablet (20 mg total) by mouth daily.    30 tablet   0   . mometasone-formoterol (DULERA) 100-5 MCG/ACT AERO   Inhalation   Inhale 2 puffs into the lungs 2 (two) times daily.   1 Inhaler   0   . ondansetron (ZOFRAN) 4 MG tablet   Oral   Take 1 tablet (4 mg total) by mouth every 8 (eight) hours as needed for nausea.   20 tablet   0   . oxyCODONE-acetaminophen (PERCOCET/ROXICET) 5-325 MG per tablet   Oral   Take 1 tablet by mouth every 4 (four) hours as needed. Pain.         . pantoprazole (PROTONIX) 40 MG tablet   Oral   Take 1 tablet (40 mg total) by mouth 2 (two) times daily before a meal.   30 tablet   1   . predniSONE (DELTASONE) 20 MG tablet      Take 2 tablets daily for 3 days, then 1 tablet daily for 3 days, then half tablet daily for 3 days, then STOP   12 tablet   0    BP 147/75  Pulse 75  Temp(Src) 97.8 F (36.6 C) (Oral)  Resp 20  Ht 5\' 4"  (1.626 m)  Wt 150 lb (68.04 kg)  BMI 25.73 kg/m2  SpO2 100% Physical Exam  Constitutional: She is oriented to person, place, and time. She appears well-developed and well-nourished. No distress.  HENT:  Head: Normocephalic and atraumatic.  Right Ear: Hearing normal.  Left Ear: Hearing normal.  Nose: Nose normal.  Mouth/Throat: Oropharynx is clear and moist and mucous membranes are normal.  Eyes: Conjunctivae and EOM are normal. Pupils are equal, round, and reactive to light.  Neck: Normal range of motion. Neck supple.  Cardiovascular: Regular rhythm, S1 normal and S2 normal.  Exam reveals no gallop and no friction rub.   No murmur heard. Pulmonary/Chest: Effort normal. No respiratory distress. She has wheezes. She exhibits no tenderness.  Abdominal: Soft. Normal appearance and bowel sounds are normal. There is no hepatosplenomegaly. There is tenderness in the right upper quadrant, epigastric area and left upper quadrant. There is no rebound, no guarding, no tenderness at McBurney's point and negative Murphy's sign. No hernia.  Musculoskeletal: Normal  range of motion.  Neurological: She is alert and oriented to person, place, and time. She has normal strength. No cranial nerve deficit or sensory deficit. Coordination normal. GCS eye subscore is 4. GCS verbal subscore is 5. GCS motor subscore is 6.  Skin: Skin is warm, dry  and intact. No rash noted. No cyanosis.  Psychiatric: She has a normal mood and affect. Her speech is normal and behavior is normal. Thought content normal.    ED Course   Procedures (including critical care time)  Labs Reviewed  CBC WITH DIFFERENTIAL - Abnormal; Notable for the following:    RBC 3.83 (*)    All other components within normal limits  COMPREHENSIVE METABOLIC PANEL - Abnormal; Notable for the following:    Total Bilirubin 0.1 (*)    All other components within normal limits  LIPASE, BLOOD   Dg Abd Acute W/chest  02/16/2013   *RADIOLOGY REPORT*  Clinical Data: Abdominal pain.  ACUTE ABDOMEN SERIES (ABDOMEN 2 VIEW & CHEST 1 VIEW)  Comparison: 10/20/2012  Findings: Prior cholecystectomy. The bowel gas pattern is normal. There is no evidence of free intraperitoneal air.  No suspicious radio-opaque calculi or other significant radiographic abnormality is seen. Heart size and mediastinal contours are within normal limits.  Both lungs are clear.  IMPRESSION: No acute findings.   Original Report Authenticated By: Charlett Nose, M.D.   Diagnosis: Abdominal pain  MDM  Patient presents to the ER for evaluation of abdominal pain. Patient reports that she has been having pain for 2 days which worsens after she eats. This is accompanied by diarrhea. Patient has had a previous cholecystectomy. Examination was benign, mild tenderness across upper abdomen without peritonitis. No lower abdominal or pelvic pain or tenderness. Lab work was unremarkable. She does report a previous history of ulcers. She has not noticed any bleeding or black stools and her hemoglobin is normal. Postprandial pain can be a symptom of peptic ulcer  disease, patient will be treated with high-dose H2 blockers and Carafate. She is to followup with [facility] and/or gastroenterology.  Gilda Crease, MD 02/16/13 [phone]

## 2013-02-16 NOTE — ED Notes (Signed)
Pt reports severe ab pain for 2 days, she has diarrhea "everytime I eat".  Denies any n/v. No fever, denies any urinary s/s. Has been taking midol for the pain and is not helping.

## 2013-02-16 NOTE — ED Notes (Signed)
Pt states that she usually takes Percocet for chronic back pain and neb treatments at home and is requesting both items.

## 2013-03-10 ENCOUNTER — Telehealth: Payer: Self-pay | Admitting: Obstetrics and Gynecology

## 2013-03-10 NOTE — Telephone Encounter (Signed)
Left message x 2. JSY 

## 2013-03-10 NOTE — Telephone Encounter (Signed)
Left message x 3. JSY 

## 2013-03-10 NOTE — Telephone Encounter (Signed)
Left message x 1. JSY 

## 2013-03-11 ENCOUNTER — Telehealth: Payer: Self-pay | Admitting: Obstetrics and Gynecology

## 2013-03-11 NOTE — Telephone Encounter (Signed)
Pt states had endometrial ablation 2009 has had no bleeding until last week. Informed pt will need an appt to be evaluated. Pt stated would call office back to make an appt.

## 2013-03-29 ENCOUNTER — Encounter: Payer: Self-pay | Admitting: Adult Health

## 2013-03-29 ENCOUNTER — Ambulatory Visit (INDEPENDENT_AMBULATORY_CARE_PROVIDER_SITE_OTHER): Payer: Medicaid Other | Admitting: Adult Health

## 2013-03-29 VITALS — BP 132/82 | Ht 64.0 in | Wt 156.0 lb

## 2013-03-29 DIAGNOSIS — N926 Irregular menstruation, unspecified: Secondary | ICD-10-CM

## 2013-03-29 DIAGNOSIS — R1013 Epigastric pain: Secondary | ICD-10-CM

## 2013-03-29 DIAGNOSIS — R5383 Other fatigue: Secondary | ICD-10-CM

## 2013-03-29 DIAGNOSIS — R5381 Other malaise: Secondary | ICD-10-CM

## 2013-03-29 HISTORY — DX: Epigastric pain: R10.13

## 2013-03-29 HISTORY — DX: Other fatigue: R53.83

## 2013-03-29 MED ORDER — OMEPRAZOLE 20 MG PO CPDR: 20.0000 mg | DELAYED_RELEASE_CAPSULE | Freq: Every day | ORAL | Status: DC

## 2013-03-29 NOTE — Patient Instructions (Addendum)
Peptic Ulcer A peptic ulcer is a sore in the lining of in your esophagus (esophageal ulcer), stomach (gastric ulcer), or in the first part of your small intestine (duodenal ulcer). The ulcer causes erosion into the deeper tissue. CAUSES  Normally, the lining of the stomach and the small intestine protects itself from the acid that digests food. The protective lining can be damaged by:  An infection caused by a bacterium called Helicobacter pylori (H. pylori).  Regular use of nonsteroidal anti-inflammatory drugs (NSAIDs), such as ibuprofen or aspirin.  Smoking tobacco. Other risk factors include being older than 50, drinking alcohol excessively, and having a family history of ulcer disease.  SYMPTOMS   Burning pain or gnawing in the area between the chest and the belly button.  Heartburn.  Nausea and vomiting.  Bloating. The pain can be worse on an empty stomach and at night. If the ulcer results in bleeding, it can cause:  Black, tarry stools.  Vomiting of bright red blood.  Vomiting of coffee ground looking materials. DIAGNOSIS  A diagnosis is usually made based upon your history and an exam. Other tests and procedures may be performed to find the cause of the ulcer. Finding a cause will help determine the best treatment. Tests and procedures may include:  Blood tests, stool tests, or breath tests to check for the bacterium H. pylori.  An upper gastrointestinal (GI) series of the esophagus, stomach, and small intestine.  An endoscopy to examine the esophagus, stomach, and small intestine.  A biopsy. TREATMENT  Treatment may include:  Eliminating the cause of the ulcer, such as smoking, NSAIDs, or alcohol.  Medicines to reduce the amount of acid in your digestive tract.  Antibiotic medicines if the ulcer is caused by the H. pylori bacterium.  An upper endoscopy to treat a bleeding ulcer.  Surgery if the bleeding is severe or if the ulcer created a hole somewhere in the  digestive system. HOME CARE INSTRUCTIONS   Avoid tobacco, alcohol, and caffeine. Smoking can increase the acid in the stomach, and continued smoking will impair the healing of ulcers.  Avoid foods and drinks that seem to cause discomfort or aggravate your ulcer.  Only take medicines as directed by your caregiver. Do not substitute over-the-counter medicines for prescription medicines without talking to your caregiver.  Keep any follow-up appointments and tests as directed. SEEK MEDICAL CARE IF:   Your do not improve within 7 days of starting treatment.  You have ongoing indigestion or heartburn. SEEK IMMEDIATE MEDICAL CARE IF:   You have sudden, sharp, or persistent abdominal pain.  You have bloody or dark black, tarry stools.  You vomit blood or vomit that looks like coffee grounds.  You become light headed, weak, or feel faint.  You become sweaty or clammy. MAKE SURE YOU:   Understand these instructions.  Will watch your condition.  Will get help right away if you are not doing well or get worse. Document Released: 06/28/2000 Document Revised: 03/25/2012 Document Reviewed: 01/29/2012 [facility] Patient Information 2014 [place], [place]. Fatigue Fatigue is a feeling of tiredness, lack of energy, lack of motivation, or feeling tired all the time. Having enough rest, good nutrition, and reducing stress will normally reduce fatigue. Consult your caregiver if it persists. The nature of your fatigue will help your caregiver to find out its cause. The treatment is based on the cause.  CAUSES  There are many causes for fatigue. Most of the time, fatigue can be traced to one or more  of your habits or routines. Most causes fit into one or more of three general areas. They are: Lifestyle problems  Sleep disturbances.  Overwork.  Physical exertion.  Unhealthy habits.  Poor eating habits or eating disorders.  Alcohol and/or drug use .  Lack of proper nutrition  (malnutrition). Psychological problems  Stress and/or anxiety problems.  Depression.  Grief.  Boredom. Medical Problems or Conditions  Anemia.  Pregnancy.  Thyroid gland problems.  Recovery from major surgery.  Continuous pain.  Emphysema or asthma that is not well controlled  Allergic conditions.  Diabetes.  Infections (such as mononucleosis).  Obesity.  Sleep disorders, such as sleep apnea.  Heart failure or other heart-related problems.  Cancer.  Kidney disease.  Liver disease.  Effects of certain medicines such as antihistamines, cough and cold remedies, prescription pain medicines, heart and blood pressure medicines, drugs used for treatment of cancer, and some antidepressants. SYMPTOMS  The symptoms of fatigue include:   Lack of energy.  Lack of drive (motivation).  Drowsiness.  Feeling of indifference to the surroundings. DIAGNOSIS  The details of how you feel help guide your caregiver in finding out what is causing the fatigue. You will be asked about your present and past health condition. It is important to review all medicines that you take, including prescription and non-prescription items. A thorough exam will be done. You will be questioned about your feelings, habits, and normal lifestyle. Your caregiver may suggest blood tests, urine tests, or other tests to look for common medical causes of fatigue.  TREATMENT  Fatigue is treated by correcting the underlying cause. For example, if you have continuous pain or depression, treating these causes will improve how you feel. Similarly, adjusting the dose of certain medicines will help in reducing fatigue.  HOME CARE INSTRUCTIONS   Try to get the required amount of good sleep every night.  Eat a healthy and nutritious diet, and drink enough water throughout the day.  Practice ways of relaxing (including yoga or meditation).  Exercise regularly.  Make plans to change situations that cause  stress. Act on those plans so that stresses decrease over time. Keep your work and personal routine reasonable.  Avoid street drugs and minimize use of alcohol.  Start taking a daily multivitamin after consulting your caregiver. SEEK MEDICAL CARE IF:   You have persistent tiredness, which cannot be accounted for.  You have fever.  You have unintentional weight loss.  You have headaches.  You have disturbed sleep throughout the night.  You are feeling sad.  You have constipation.  You have dry skin.  You have gained weight.  You are taking any new or different medicines that you suspect are causing fatigue.  You are unable to sleep at night.  You develop any unusual swelling of your legs or other parts of your body. SEEK IMMEDIATE MEDICAL CARE IF:   You are feeling confused.  Your vision is blurred.  You feel faint or pass out.  You develop severe headache.  You develop severe abdominal, pelvic, or back pain.  You develop chest pain, shortness of breath, or an irregular or fast heartbeat.  You are unable to pass a normal amount of urine.  You develop abnormal bleeding such as bleeding from the rectum or you vomit blood.  You have thoughts about harming yourself or committing suicide.  You are worried that you might harm someone else. MAKE SURE YOU:   Understand these instructions.  Will watch your condition.  Will get  help right away if you are not doing well or get worse. Document Released: 04/28/2007 Document Revised: 09/23/2011 Document Reviewed: 04/28/2007 [facility] Patient Information 2014 [place], [place]. Try prilosec Watch diet Return in 1 week for [place]

## 2013-03-29 NOTE — Progress Notes (Signed)
Subjective:     Patient ID: [name], female   DOB: Mar 06, 1966, 47 y.o.   MRN: [id number]  HPI [name] is a 47 year old white female in complaining of mid epigastric pain, no fever, was seen in ER 8/5 had labs and [place] then and was normal.Has history of ulcers.Had EGD in 09/2012.Has difficulty swallowing first thing in morning.She also has started having periods again sp ablation.She complains of being tired.  Review of Systems Positives in HPI Reviewed past medical,surgical, social and family history. Reviewed medications and allergies.     Objective:   Physical Exam BP 132/82  Ht 5\' 4"  (1.626 m)  Wt 156 lb (70.761 kg)  BMI 26.76 kg/m2  LMP 03/19/2013 Skin warm and dry. Neck: mid line trachea, normal thyroid. Abdomen was soft, no HSM noted has tenderness mid epi gastric area, she is sp gallbladder removal.Pelvic: external genitalia is normal in appearance, vagina: white discharge without odor, cervix:smooth, no polyps or lesions, uterus: normal size, shape and contour, non tender, no masses felt, adnexa: no masses or tenderness noted.    Assessment:    Mid epigastric pain ? Recurrent ulcer Irregular periods sp ablation Fatigue    Plan:      Rx Prilosec 20 mg #30 1 daily with 11 refills   Watch diet, if persists, refer back to GI Review handout on ulcers Return in 1 week for pelvic US to assess uterus and see me, will schedule pap after this visit Check TSH

## 2013-03-30 ENCOUNTER — Telehealth: Payer: Self-pay | Admitting: Adult Health

## 2013-03-30 NOTE — Telephone Encounter (Signed)
Left message that TSH normal.

## 2013-04-05 ENCOUNTER — Ambulatory Visit (INDEPENDENT_AMBULATORY_CARE_PROVIDER_SITE_OTHER): Payer: Medicaid Other

## 2013-04-05 ENCOUNTER — Other Ambulatory Visit: Payer: Self-pay | Admitting: Adult Health

## 2013-04-05 ENCOUNTER — Encounter: Payer: Self-pay | Admitting: Adult Health

## 2013-04-05 ENCOUNTER — Ambulatory Visit (INDEPENDENT_AMBULATORY_CARE_PROVIDER_SITE_OTHER): Payer: Medicaid Other | Admitting: Adult Health

## 2013-04-05 VITALS — BP 120/80 | Ht 64.0 in | Wt 156.0 lb

## 2013-04-05 DIAGNOSIS — N926 Irregular menstruation, unspecified: Secondary | ICD-10-CM

## 2013-04-05 NOTE — Patient Instructions (Addendum)
Follow up in 4 weeks  Call prn 

## 2013-04-05 NOTE — Progress Notes (Signed)
Subjective:     Patient ID: [name], female   DOB: Dec 11, 1965, 47 y.o.   MRN: [id number]  HPI Tabita is back [place] due to irregular bleeding after ablation.  Review of Systems See HPI Reviewed past medical,surgical, social and family history. Reviewed medications and allergies.     Objective:   Physical Exam BP 120/80  Ht 5\' 4"  (1.626 m)  Wt 156 lb (70.761 kg)  BMI 26.76 kg/m2  LMP 03/19/2013   reviewed [place] with pt it was all normal, keep period calendar Assessment:     Irregular bleeding    Plan:     Follow up in  4 weeks for pap and physical Call prn

## 2013-04-27 ENCOUNTER — Emergency Department (HOSPITAL_COMMUNITY)
Admission: EM | Admit: 2013-04-27 | Discharge: 2013-04-27 | Disposition: A | Discharge status: Home / Self Care | Payer: Medicaid Other | Attending: Emergency Medicine | Admitting: Emergency Medicine

## 2013-04-27 ENCOUNTER — Encounter (HOSPITAL_COMMUNITY): Payer: Self-pay | Admitting: Emergency Medicine

## 2013-04-27 ENCOUNTER — Emergency Department (HOSPITAL_COMMUNITY): Payer: Medicaid Other

## 2013-04-27 DIAGNOSIS — Z79899 Other long term (current) drug therapy: Secondary | ICD-10-CM | POA: Insufficient documentation

## 2013-04-27 DIAGNOSIS — Z87891 Personal history of nicotine dependence: Secondary | ICD-10-CM | POA: Insufficient documentation

## 2013-04-27 DIAGNOSIS — F3289 Other specified depressive episodes: Secondary | ICD-10-CM | POA: Insufficient documentation

## 2013-04-27 DIAGNOSIS — R112 Nausea with vomiting, unspecified: Secondary | ICD-10-CM | POA: Insufficient documentation

## 2013-04-27 DIAGNOSIS — I10 Essential (primary) hypertension: Secondary | ICD-10-CM | POA: Insufficient documentation

## 2013-04-27 DIAGNOSIS — R109 Unspecified abdominal pain: Secondary | ICD-10-CM

## 2013-04-27 DIAGNOSIS — Z3202 Encounter for pregnancy test, result negative: Secondary | ICD-10-CM | POA: Insufficient documentation

## 2013-04-27 DIAGNOSIS — R1031 Right lower quadrant pain: Secondary | ICD-10-CM | POA: Insufficient documentation

## 2013-04-27 DIAGNOSIS — K219 Gastro-esophageal reflux disease without esophagitis: Secondary | ICD-10-CM | POA: Insufficient documentation

## 2013-04-27 DIAGNOSIS — M545 Low back pain, unspecified: Secondary | ICD-10-CM | POA: Insufficient documentation

## 2013-04-27 DIAGNOSIS — K921 Melena: Secondary | ICD-10-CM | POA: Insufficient documentation

## 2013-04-27 DIAGNOSIS — R1032 Left lower quadrant pain: Secondary | ICD-10-CM | POA: Insufficient documentation

## 2013-04-27 DIAGNOSIS — F329 Major depressive disorder, single episode, unspecified: Secondary | ICD-10-CM | POA: Insufficient documentation

## 2013-04-27 DIAGNOSIS — J4489 Other specified chronic obstructive pulmonary disease: Secondary | ICD-10-CM | POA: Insufficient documentation

## 2013-04-27 DIAGNOSIS — G8929 Other chronic pain: Secondary | ICD-10-CM | POA: Insufficient documentation

## 2013-04-27 DIAGNOSIS — J449 Chronic obstructive pulmonary disease, unspecified: Secondary | ICD-10-CM | POA: Insufficient documentation

## 2013-04-27 DIAGNOSIS — F411 Generalized anxiety disorder: Secondary | ICD-10-CM | POA: Insufficient documentation

## 2013-04-27 DIAGNOSIS — R6883 Chills (without fever): Secondary | ICD-10-CM | POA: Insufficient documentation

## 2013-04-27 LAB — URINE MICROSCOPIC-ADD ON

## 2013-04-27 LAB — COMPREHENSIVE METABOLIC PANEL
AST: 21 U/L (ref 0–37)
Albumin: 4 g/dL (ref 3.5–5.2)
BUN: 9 mg/dL (ref 6–23)
CO2: 27 mEq/L (ref 19–32)
Chloride: 99 mEq/L (ref 96–112)
Creatinine, Ser: 0.71 mg/dL (ref 0.50–1.10)
GFR calc Af Amer: >90 mL/min (ref >90)
Glucose, Bld: 101 mg/dL — ABNORMAL HIGH (ref 70–99)
Potassium: 4.4 mEq/L (ref 3.5–5.1)
Total Bilirubin: 0.7 mg/dL (ref 0.3–1.2)
Total Protein: 7.5 g/dL (ref 6.0–8.3)

## 2013-04-27 LAB — CBC WITH DIFFERENTIAL/PLATELET
Basophils Absolute: 0 10*3/uL (ref 0.0–0.1)
Basophils Relative: 1 % (ref 0–1)
Eosinophils Absolute: 0.1 10*3/uL (ref 0.0–0.7)
Eosinophils Relative: 2 % (ref 0–5)
Lymphocytes Relative: 20 % (ref 12–46)
MCH: 33.3 pg (ref 26.0–34.0)
MCHC: 33.9 g/dL (ref 30.0–36.0)
MCV: 98.5 fL (ref 78.0–100.0)
Monocytes Relative: 9 % (ref 3–12)
Neutro Abs: 4.3 10*3/uL (ref 1.7–7.7)
Neutrophils Relative %: 69 % (ref 43–77)
RBC: 3.93 MIL/uL (ref 3.87–5.11)
RDW: 12.4 % (ref 11.5–15.5)

## 2013-04-27 LAB — URINALYSIS, ROUTINE W REFLEX MICROSCOPIC
Glucose, UA: NEGATIVE mg/dL
Ketones, ur: >80 mg/dL — AB
Leukocytes, UA: NEGATIVE
Nitrite: NEGATIVE
Specific Gravity, Urine: >1.03 — ABNORMAL HIGH (ref 1.005–1.030)
pH: 6 (ref 5.0–8.0)

## 2013-04-27 LAB — PREGNANCY, URINE: Preg Test, Ur: NEGATIVE

## 2013-04-27 LAB — LIPASE, BLOOD: Lipase: 30 U/L (ref 11–59)

## 2013-04-27 MED ORDER — SUCRALFATE 1 G PO TABS: 1.0000 g | ORAL_TABLET | Freq: Four times a day (QID) | ORAL | Status: DC

## 2013-04-27 MED ORDER — IOHEXOL 300 MG/ML  SOLN
100.0000 mL | Freq: Once | INTRAMUSCULAR | Status: AC | PRN
  Administered 2013-04-27: 100 mL via INTRAVENOUS

## 2013-04-27 MED ORDER — LORAZEPAM 2 MG/ML IJ SOLN
0.5000 mg | Freq: Once | INTRAMUSCULAR | Status: AC
  Administered 2013-04-27: 0.5 mg via INTRAVENOUS
  Filled 2013-04-27: qty 1

## 2013-04-27 MED ORDER — ONDANSETRON HCL 4 MG/2ML IJ SOLN
4.0000 mg | Freq: Once | INTRAMUSCULAR | Status: AC
  Administered 2013-04-27: 4 mg via INTRAVENOUS
  Filled 2013-04-27: qty 2

## 2013-04-27 MED ORDER — ONDANSETRON HCL 4 MG/2ML IJ SOLN
INTRAMUSCULAR | Status: AC
  Administered 2013-04-27: 4 mg
  Filled 2013-04-27: qty 2

## 2013-04-27 MED ORDER — HYDROMORPHONE HCL PF 1 MG/ML IJ SOLN
1.0000 mg | Freq: Once | INTRAMUSCULAR | Status: AC
  Administered 2013-04-27: 1 mg via INTRAVENOUS
  Filled 2013-04-27: qty 1

## 2013-04-27 MED ORDER — SUCRALFATE 1 GM/10ML PO SUSP: 1.0000 g | Freq: Four times a day (QID) | ORAL | Status: DC

## 2013-04-27 MED ORDER — IOHEXOL 300 MG/ML  SOLN
50.0000 mL | Freq: Once | INTRAMUSCULAR | Status: AC | PRN
  Administered 2013-04-27: 50 mL via ORAL

## 2013-04-27 NOTE — ED Notes (Signed)
Pt reports severe ab for 2 days, pain in across her lower ab, and low back. No fever, +nausea. No vomiting or diarrhea.

## 2013-04-27 NOTE — ED Notes (Signed)
Pt reports black stools for the past two days.  Pt also reports lower abd pain that radiates to her lower back.  Pt reports some n/v, and chills  yesterday.  Pt reports taking some pepto bimol last night.

## 2013-04-27 NOTE — ED Provider Notes (Signed)
CSN: [id number]     Arrival date & time 04/27/13  0903 History  This chart was scribed for Christine Lennert, MD by Quintella Reichert, ED scribe.  This patient was seen in room APA12/AP[facility]12 and the patient's care was started at 9:24 AM.  Chief Complaint  Patient presents with  . Abdominal Pain  . Back Pain  . Melena    Patient is a 47 y.o. female presenting with abdominal pain. The history is provided by the patient. No language interpreter was used.  Abdominal Pain Pain location:  LLQ and RLQ Pain radiates to:  Back Pain severity:  Severe Duration: Began yesterday morning. Chronicity:  New Ineffective treatments: pepto bismol. Associated symptoms: chills, melena, nausea and vomiting   Associated symptoms: no chest pain, no cough, no diarrhea, no fatigue and no hematuria   Risk factors: multiple surgeries   Risk factors: no aspirin use and no NSAID use     HPI Comments: [name] is a 47 y.o. female who presents to the Emergency Department complaining of severe lower abdominal pain that began yesterday morning.  Pain extends to the lower back and pt states "it feels like someone's got their hands on my back and is pulling me down."   She also notes black stools that began this morning.  She denies prior h/o melena.  She is not taking aspirin, Motrin, or BC powder.  Pt also reports some nausea, vomiting and chills yesterday.  She took Pepto Bismol last night.  Surgical history includes 2  c-sections, cholecystectomy, and endometrial ablation.  Pt is a former smoker and quit 18 months ago..   Past Medical History  Diagnosis Date  . Asthma   . COPD (chronic obstructive pulmonary disease)   . Hypertension   . GERD (gastroesophageal reflux disease)   . Anxiety disorder   . Chronic pain syndrome   . Anxiety   . Depression   . Fibromyalgia   . Epigastric pain 03/29/2013  . Fatigue 03/29/2013    Past Surgical History  Procedure Laterality Date  . Cholecystectomy    . Cesarean  section    . Carpal tunnel release    . Esophagogastroduodenoscopy  07/31/2011    Procedure: ESOPHAGOGASTRODUODENOSCOPY (EGD);  Surgeon: Malissa Hippo, MD;  Location: [facility];  Service: Endoscopy;  Laterality: N/A;  Elease Hashimoto dilation  07/31/2011    Procedure: Elease Hashimoto DILATION;  Surgeon: Malissa Hippo, MD;  Location: [facility];  Service: Endoscopy;;  . Endometrial ablation    . Mass excision  05/20/2012    Procedure: EXCISION MASS;  Surgeon: Darreld Mclean, MD;  Location: [facility];  Service: Orthopedics;  Laterality: Left;  Repair of Fascial Defect Left Knee  . Carpal tunnel release  07/23/2012    Procedure: CARPAL TUNNEL RELEASE;  Surgeon: Darreld Mclean, MD;  Location: [facility];  Service: Orthopedics;  Laterality: Left;  . Esophagogastroduodenoscopy (egd) with esophageal dilation N/A 09/30/2012    Procedure: ESOPHAGOGASTRODUODENOSCOPY (EGD) WITH ESOPHAGEAL DILATION;  Surgeon: Malissa Hippo, MD;  Location: [facility];  Service: Endoscopy;  Laterality: N/A;  245  . Knee surgery Left     Family History  Problem Relation Age of Onset  . Asthma Mother   . Asthma Father   . Cancer Father     History  Substance Use Topics  . Smoking status: Former Smoker    Types: Cigarettes    Quit date: 10/26/2011  . Smokeless tobacco: Never Used  . Alcohol Use: 1.2 oz/week  2 Cans of beer per week     Comment: social    OB History   Grav Para Term Preterm Abortions TAB SAB Ect Mult Living   2 2        1       Review of Systems  Constitutional: Positive for chills. Negative for appetite change and fatigue.  HENT: Negative for congestion, ear discharge and sinus pressure.   Eyes: Negative for discharge.  Respiratory: Negative for cough.   Cardiovascular: Negative for chest pain.  Gastrointestinal: Positive for nausea, vomiting, abdominal pain and melena. Negative for diarrhea.  Genitourinary: Negative for frequency and hematuria.  Skin: Negative for rash.  Neurological: Negative  for seizures and headaches.  Psychiatric/Behavioral: Negative for hallucinations.     Allergies  Review of patient's allergies indicates no known allergies.  Home Medications   Current Outpatient Rx  Name  Route  Sig  Dispense  Refill  . albuterol (PROVENTIL HFA;VENTOLIN HFA) 108 (90 BASE) MCG/ACT inhaler   Inhalation   Inhale 2 puffs into the lungs every 6 (six) hours as needed for wheezing or shortness of breath. For shortness of breath   1 Inhaler   1   . albuterol (PROVENTIL) (2.5 MG/3ML) 0.083% nebulizer solution   Nebulization   Take 2.5 mg by nebulization every 6 (six) hours as needed. For shortness of breath         . amLODipine (NORVASC) 5 MG tablet   Oral   Take 5 mg by mouth daily.           Marland Kitchen buPROPion (WELLBUTRIN XL) 300 MG 24 hr tablet   Oral   Take 300 mg by mouth every morning.           . citalopram (CELEXA) 20 MG tablet   Oral   Take 20 mg by mouth daily.         . diazepam (VALIUM) 10 MG tablet   Oral   Take 10 mg by mouth every 6 (six) hours as needed for anxiety.         . fish oil-omega-3 fatty acids 1000 MG capsule   Oral   Take 2 g by mouth daily.          Marland Kitchen labetalol (NORMODYNE) 100 MG tablet   Oral   Take 200 mg by mouth 2 (two) times daily.         Marland Kitchen lisinopril (PRINIVIL,ZESTRIL) 20 MG tablet   Oral   Take 1 tablet (20 mg total) by mouth daily.   30 tablet   0   . mirtazapine (REMERON) 15 MG tablet   Oral   Take 15 mg by mouth at bedtime.         . mometasone-formoterol (DULERA) 100-5 MCG/ACT AERO   Inhalation   Inhale 2 puffs into the lungs 2 (two) times daily.   1 Inhaler   0   . oxyCODONE-acetaminophen (PERCOCET) 10-325 MG per tablet   Oral   Take 1 tablet by mouth every 4 (four) hours as needed for pain.         . pantoprazole (PROTONIX) 40 MG tablet   Oral   Take 1 tablet (40 mg total) by mouth 2 (two) times daily before a meal.   30 tablet   1    BP 134/82  Pulse 82  Temp(Src) 97.9 F (36.6  C) (Oral)  Resp 22  SpO2 100%  LMP 03/19/2013  Physical Exam  Vitals reviewed. Constitutional: She is oriented to person,  place, and time. She appears well-developed.  HENT:  Head: Normocephalic.  Eyes: Conjunctivae and EOM are normal. No scleral icterus.  Neck: Neck supple. No thyromegaly present.  Cardiovascular: Normal rate and regular rhythm.  Exam reveals no gallop and no friction rub.   No murmur heard. Pulmonary/Chest: No stridor. She has no wheezes. She has no rales. She exhibits no tenderness.  Abdominal: Soft. Bowel sounds are normal. She exhibits no distension. There is tenderness in the right lower quadrant, suprapubic area and left lower quadrant. There is no rebound.  Bilateral flank tenderness  Musculoskeletal: Normal range of motion. She exhibits no edema.  Lymphadenopathy:    She has no cervical adenopathy.  Neurological: She is oriented to person, place, and time. She exhibits normal muscle tone. Coordination normal.  Skin: No rash noted. No erythema.  Psychiatric: She has a normal mood and affect. Her behavior is normal.    ED Course  Procedures (including critical care time)  DIAGNOSTIC STUDIES: Oxygen Saturation is 100% on room air, normal by my interpretation.    COORDINATION OF CARE: 9:28 AM-Discussed treatment plan which includes pain medication and labs with pt at bedside and pt agreed to plan.     Labs Review Labs Reviewed  COMPREHENSIVE METABOLIC PANEL - Abnormal; Notable for the following:    Glucose, Bld 101 (*)    All other components within normal limits  URINALYSIS, ROUTINE W REFLEX MICROSCOPIC - Abnormal; Notable for the following:    Specific Gravity, Urine >1.030 (*)    Hgb urine dipstick TRACE (*)    Bilirubin Urine SMALL (*)    Ketones, ur >80 (*)    All other components within normal limits  CBC WITH DIFFERENTIAL  LIPASE, BLOOD  PREGNANCY, URINE  URINE MICROSCOPIC-ADD ON    Imaging Review Ct Abdomen Pelvis W  Contrast  04/27/2013   CLINICAL DATA:  Severe lower abdominal pain for 2 days and low back pain, dark stools  EXAM: CT ABDOMEN AND PELVIS WITH CONTRAST  TECHNIQUE: Multidetector CT imaging of the abdomen and pelvis was performed using the standard protocol following bolus administration of intravenous contrast.  CONTRAST:  OMNIPAQUE IOHEXOL 300 MG/ML  SOLN  COMPARISON:  CT abdomen pelvis of 07/05/2011  FINDINGS: The lung bases are clear. The liver enhances with no focal abnormality and no ductal dilatation is seen. The gallbladder has been resected previously. The pancreas is normal in size in the pancreatic duct is not dilated. The adrenal glands and spleen are unremarkable. The stomach is distended with contrast material and is unremarkable. The kidneys enhance with no calculus or mass and on delayed images, the pelvocaliceal systems are unremarkable with no evidence of hydronephrosis. The proximal ureters are normal in caliber. The abdominal aorta is normal in caliber. No adenopathy is seen.  The urinary bladder is decompressed and cannot be evaluated. Bilateral ovarian cysts are present larger on the right with a right ovarian cyst measuring 3.7 x 3.9 cm m. The left ovarian cyst measures 2.7 x 3.3 cm. The uterus is normal in size. No fluid is seen within the pelvis.  However, there is abnormality of the mucosa of the right colon with fatty replacement. This can be seen with history of chronic inflammatory bowel disease possibly Crohn's or ulcerative colitis with fatty stratification of the mucosa. No present inflammation is seen. The terminal ileum and the appendix are unremarkable. 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 Clinical correlation is recommended.  IMPRESSION: 1. No acute process is seen. 2. Fatty stratification of the mucosa of the right  colon may be related to chronic inflammatory bowel disease. No current inflammation is seen. 3. 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 Correlate clinically. 4. Bilateral ovarian cysts.   Electronically Signed   By: Dwyane Dee M.D.   On: 04/27/2013 13:10    EKG Interpretation   None       MDM  No diagnosis found.     The chart was scribed for me under my direct supervision.  I personally performed the history, physical, and medical decision making and all procedures in the evaluation of this patient.Christine Lennert, MD 04/27/13 [phone]

## 2013-04-28 ENCOUNTER — Telehealth (INDEPENDENT_AMBULATORY_CARE_PROVIDER_SITE_OTHER): Payer: Self-pay | Admitting: *Deleted

## 2013-04-28 NOTE — Telephone Encounter (Signed)
Spoke with [name] and she has taken a pain pill to see if it would help the pain in order to relax. [name] is wanting whatever is wrong with her to be treated. Terri, do we need to consult Dr. Karilyn Cota and/or schedule her for a TCS?

## 2013-04-28 NOTE — Telephone Encounter (Signed)
[name] was seen in the ED yesterday, 04/27/13, and a f/u apt has been scheduled for 04/29/13 at 11:30 am. She has called back crying stating her abd pain is really bad and would like to know if there anything that can be done until she is seen. A CT was done at [facility]. Her return phone number is [phone].

## 2013-04-28 NOTE — Telephone Encounter (Signed)
I spoken with patient. She says she feels somewhat better. She says her stools are brown today. She had taken one dose of Pepto Bismol. ? This may have caused her black stools. I did not see where they checked her stools. Her H and H are normal.  She will have an OV in the morning. I advised her to pick up her antibiotics from the [facility]. If her symptoms worsen, she is to go to the ED.

## 2013-04-29 ENCOUNTER — Ambulatory Visit (INDEPENDENT_AMBULATORY_CARE_PROVIDER_SITE_OTHER): Payer: Medicaid Other | Admitting: Internal Medicine

## 2013-04-29 ENCOUNTER — Other Ambulatory Visit (INDEPENDENT_AMBULATORY_CARE_PROVIDER_SITE_OTHER): Payer: Self-pay | Admitting: Internal Medicine

## 2013-04-29 ENCOUNTER — Encounter (INDEPENDENT_AMBULATORY_CARE_PROVIDER_SITE_OTHER): Payer: Self-pay | Admitting: Internal Medicine

## 2013-04-29 VITALS — BP 98/60 | HR 72 | Temp 99.4°F | Ht 65.0 in | Wt 154.6 lb

## 2013-04-29 DIAGNOSIS — R109 Unspecified abdominal pain: Secondary | ICD-10-CM | POA: Insufficient documentation

## 2013-04-29 DIAGNOSIS — R1314 Dysphagia, pharyngoesophageal phase: Secondary | ICD-10-CM

## 2013-04-29 DIAGNOSIS — M549 Dorsalgia, unspecified: Secondary | ICD-10-CM | POA: Insufficient documentation

## 2013-04-29 MED ORDER — HYOSCYAMINE SULFATE 0.125 MG SL SUBL: 0.1250 mg | SUBLINGUAL_TABLET | SUBLINGUAL | Status: DC | PRN

## 2013-04-29 NOTE — Telephone Encounter (Signed)
I spoke with patient and she is having lower abdominal cramps. Started again this evening. I am going to eprescribe her Levsin to take prn.

## 2013-04-29 NOTE — Patient Instructions (Signed)
If any fever go to the ED. I will discuss with Dr. Karilyn Cota

## 2013-04-29 NOTE — Progress Notes (Signed)
Subjective:     Patient ID: [name], female   DOB: 1966-07-06, 47 y.o.   MRN: [id number]  HPI Seen in the ED Tuesday for lower abdominal pain and low back pain. CT scan revealed (see below). She says she feel somewhat better. She c/o low back pain today.  As far as her abdominal pain this is better.She denies having any fever. Appetite is okay.  She tells me she has been on steroids off and on for years due to her asthma. Her BMs were black when she went to the hospital. She had taken a dose of Pepto Bismol. Today her stools are brown. She also tells me she has solid food dysphagia and has undergone EGD/EDs in the past.  09/30/2012 EGD/ED:  Small sliding hiatal hernia without ring or stricture formation.  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       04/27/2013 CT abdomen/pelvis with CM:  IMPRESSION:  1. No acute process is seen.  2. Fatty stratification of the mucosa of the right colon may be  related to chronic inflammatory bowel disease. No current  inflammation is seen.  3. 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 Correlate clinically.  4. Bilateral ovarian cysts. CBC    Component Value Date/Time   WBC 6.2 04/27/2013 0936   RBC 3.93 04/27/2013 0936   HGB 13.1 04/27/2013 0936   HCT 38.7 04/27/2013 0936   PLT 253 04/27/2013 0936   MCV 98.5 04/27/2013 0936   MCH 33.3 04/27/2013 0936   MCHC 33.9 04/27/2013 0936   RDW 12.4 04/27/2013 0936   LYMPHSABS 1.2 04/27/2013 0936   MONOABS 0.6 04/27/2013 0936   EOSABS 0.1 04/27/2013 0936   BASOSABS 0.0 04/27/2013 0936   CMP     Component Value Date/Time   NA 136 04/27/2013 0936   K 4.4 04/27/2013 0936   CL 99 04/27/2013 0936   CO2 27 04/27/2013 0936   GLUCOSE 101* 04/27/2013 0936   BUN 9 04/27/2013 0936   CREATININE 0.71 04/27/2013 0936   CALCIUM 9.8  04/27/2013 0936   PROT 7.5 04/27/2013 0936   ALBUMIN 4.0 04/27/2013 0936   AST 21 04/27/2013 0936   ALT 13 04/27/2013 0936   ALKPHOS 70 04/27/2013 0936   BILITOT 0.7 04/27/2013 0936   GFRNONAA >90 04/27/2013 0936   GFRAA >90 04/27/2013 0936      Review of Systems     Objective:   Physical Exam  Filed Vitals:   04/29/13 1123  BP: 98/60  Pulse: 72  Temp: 99.4 F (37.4 C)  Height: 5\' 5"  (1.651 m)  Weight: 154 lb 9.6 oz (70.126 kg)   Alert and oriented. Skin warm and dry. Oral mucosa is moist.   . Sclera anicteric, conjunctivae is pink. Thyroid not enlarged. No cervical lymphadenopathy. Bilateral wheezes. Heart regular rate and rhythm.  Abdomen is soft. Bowel sounds are positive. No hepatomegaly. No abdominal masses felt. Very slight abdominal tenderness.  No edema to lower extremities. Stool brown and guaiac negative.    Assessment:    Lower abdominal pain much better today. She c/o more of lower back pain today. ? Etiology. CT scan revealed   Fatty stratification of the mucosa of the right colon may be  related to chronic inflammatory bowel disease. No current  inflammation is seen.  Dysphagia to solids. Hx of same and has several EGD/ED in the  past    Plan:   I will discuss with Dr. Karilyn Cota. Will call patient Monday. She will continue the Carafate.

## 2013-05-05 ENCOUNTER — Ambulatory Visit (HOSPITAL_COMMUNITY)
Admission: RE | Admit: 2013-05-05 | Discharge: 2013-05-05 | Disposition: A | Discharge status: Home / Self Care | Payer: Medicaid Other | Source: Ambulatory Visit | Attending: Internal Medicine | Admitting: Internal Medicine

## 2013-05-05 DIAGNOSIS — IMO0001 Reserved for inherently not codable concepts without codable children: Secondary | ICD-10-CM | POA: Insufficient documentation

## 2013-05-05 DIAGNOSIS — M545 Low back pain, unspecified: Secondary | ICD-10-CM | POA: Insufficient documentation

## 2013-05-06 ENCOUNTER — Other Ambulatory Visit: Payer: Medicaid Other | Admitting: Obstetrics and Gynecology

## 2013-05-26 ENCOUNTER — Other Ambulatory Visit: Payer: Medicaid Other | Admitting: Obstetrics and Gynecology

## 2013-06-28 ENCOUNTER — Other Ambulatory Visit (HOSPITAL_COMMUNITY)
Admission: RE | Admit: 2013-06-28 | Discharge: 2013-06-28 | Disposition: A | Discharge status: Home / Self Care | Payer: Medicaid Other | Source: Ambulatory Visit | Attending: Obstetrics and Gynecology | Admitting: Obstetrics and Gynecology

## 2013-06-28 ENCOUNTER — Encounter (INDEPENDENT_AMBULATORY_CARE_PROVIDER_SITE_OTHER): Payer: Self-pay

## 2013-06-28 ENCOUNTER — Ambulatory Visit (INDEPENDENT_AMBULATORY_CARE_PROVIDER_SITE_OTHER): Payer: Medicaid Other | Admitting: Obstetrics and Gynecology

## 2013-06-28 ENCOUNTER — Encounter: Payer: Self-pay | Admitting: Obstetrics and Gynecology

## 2013-06-28 VITALS — BP 146/92 | Ht 64.25 in | Wt 161.0 lb

## 2013-06-28 DIAGNOSIS — Z1212 Encounter for screening for malignant neoplasm of rectum: Secondary | ICD-10-CM

## 2013-06-28 DIAGNOSIS — Z01419 Encounter for gynecological examination (general) (routine) without abnormal findings: Secondary | ICD-10-CM | POA: Insufficient documentation

## 2013-06-28 DIAGNOSIS — Z0000 Encounter for general adult medical examination without abnormal findings: Secondary | ICD-10-CM

## 2013-06-28 DIAGNOSIS — Z1151 Encounter for screening for human papillomavirus (HPV): Secondary | ICD-10-CM | POA: Insufficient documentation

## 2013-06-28 MED ORDER — IBUPROFEN 800 MG PO TABS: 800.0000 mg | ORAL_TABLET | Freq: Three times a day (TID) | ORAL | Status: DC | PRN

## 2013-06-28 NOTE — Progress Notes (Signed)
Patient ID: [name], female   DOB: [birth year], 47 y.o.   MRN: [id number]  Assessment:  Annual Gyn Exam Quit smoking   Plan:  1. pap smear done, next pap due 3 yr. 2. return annually or prn  3    Annual mammogram advised Subjective:  [name] is a 47 y.o. female G2P2 who presents for annual exam. Patient's last menstrual period was 06/03/2013. The patient has complaints today of cramping and dysmenorhea with occasional menses  The following portions of the patient's history were reviewed and updated as appropriate: allergies, current medications, past family history, past medical history, past social history, past surgical history and problem list.  Review of Systems Constitutional: positive for quit smoking, and dysmenorrhea Gastrointestinal: negative Genitourinary: dark   Objective:  BP 146/92  Ht 5' 4.25" (1.632 m)  Wt 73.029 kg (161 lb)  BMI 27.42 kg/m2  LMP 06/03/2013   BMI: Body mass index is 27.42 kg/(m^2).  General Appearance: Alert, appropriate appearance for age. No acute distress HEENT: Grossly normal Neck / Thyroid:  Cardiovascular: RRR; normal S1, S2, no murmur Lungs: CTA bilaterally Back: No CVAT Breast Exam: No masses or nodes.No dimpling, nipple retraction or discharge. Gastrointestinal: Soft, non-tender, no masses or organomegaly Pelvic Exam: Vulva and vagina appear normal. Bimanual exam reveals normal uterus and adnexa. External genitalia: normal general appearance Vaginal: normal mucosa without prolapse or lesions Cervix: normal appearance Adnexa: normal bimanual exam Uterus: normal single, nontender Rectal: good sphincter tone and guaiac negative Rectovaginal: not indicated and normal rectal, no masses Lymphatic Exam: Non-palpable nodes in neck, clavicular, axillary, or inguinal regions Skin: no rash or abnormalities Neurologic: Normal gait and speech, no tremor  Psychiatric: Alert and oriented, appropriate affect.  Urinalysis:normal  and Not done  [name]. MD Pgr [phone] 12:02 PM

## 2013-06-28 NOTE — Addendum Note (Signed)
Addended by: Tilda Burrow on: 06/28/2013 12:24 PM   Modules accepted: Orders

## 2013-11-01 ENCOUNTER — Other Ambulatory Visit (HOSPITAL_COMMUNITY): Payer: Self-pay | Admitting: Internal Medicine

## 2013-11-01 DIAGNOSIS — Z0000 Encounter for general adult medical examination without abnormal findings: Secondary | ICD-10-CM

## 2013-11-04 ENCOUNTER — Ambulatory Visit (HOSPITAL_COMMUNITY)
Admission: RE | Admit: 2013-11-04 | Discharge: 2013-11-04 | Disposition: A | Discharge status: Home / Self Care | Payer: Medicaid Other | Source: Ambulatory Visit | Attending: Internal Medicine | Admitting: Internal Medicine

## 2013-11-04 DIAGNOSIS — Z1231 Encounter for screening mammogram for malignant neoplasm of breast: Secondary | ICD-10-CM | POA: Insufficient documentation

## 2013-11-04 DIAGNOSIS — Z0000 Encounter for general adult medical examination without abnormal findings: Secondary | ICD-10-CM

## 2013-12-15 ENCOUNTER — Emergency Department (HOSPITAL_COMMUNITY): Payer: Medicaid Other

## 2013-12-15 ENCOUNTER — Encounter (INDEPENDENT_AMBULATORY_CARE_PROVIDER_SITE_OTHER): Payer: Self-pay | Admitting: *Deleted

## 2013-12-15 ENCOUNTER — Emergency Department (HOSPITAL_COMMUNITY)
Admission: EM | Admit: 2013-12-15 | Discharge: 2013-12-15 | Disposition: A | Discharge status: Home / Self Care | Payer: Medicaid Other | Attending: Emergency Medicine | Admitting: Emergency Medicine

## 2013-12-15 ENCOUNTER — Encounter (HOSPITAL_COMMUNITY): Payer: Self-pay | Admitting: Emergency Medicine

## 2013-12-15 DIAGNOSIS — Z23 Encounter for immunization: Secondary | ICD-10-CM | POA: Insufficient documentation

## 2013-12-15 DIAGNOSIS — J4489 Other specified chronic obstructive pulmonary disease: Secondary | ICD-10-CM | POA: Insufficient documentation

## 2013-12-15 DIAGNOSIS — F411 Generalized anxiety disorder: Secondary | ICD-10-CM | POA: Insufficient documentation

## 2013-12-15 DIAGNOSIS — F329 Major depressive disorder, single episode, unspecified: Secondary | ICD-10-CM | POA: Insufficient documentation

## 2013-12-15 DIAGNOSIS — G894 Chronic pain syndrome: Secondary | ICD-10-CM | POA: Insufficient documentation

## 2013-12-15 DIAGNOSIS — J449 Chronic obstructive pulmonary disease, unspecified: Secondary | ICD-10-CM | POA: Insufficient documentation

## 2013-12-15 DIAGNOSIS — F3289 Other specified depressive episodes: Secondary | ICD-10-CM | POA: Insufficient documentation

## 2013-12-15 DIAGNOSIS — Y92410 Unspecified street and highway as the place of occurrence of the external cause: Secondary | ICD-10-CM | POA: Insufficient documentation

## 2013-12-15 DIAGNOSIS — Z87891 Personal history of nicotine dependence: Secondary | ICD-10-CM | POA: Insufficient documentation

## 2013-12-15 DIAGNOSIS — S93601A Unspecified sprain of right foot, initial encounter: Secondary | ICD-10-CM

## 2013-12-15 DIAGNOSIS — Z8739 Personal history of other diseases of the musculoskeletal system and connective tissue: Secondary | ICD-10-CM | POA: Insufficient documentation

## 2013-12-15 DIAGNOSIS — S93401A Sprain of unspecified ligament of right ankle, initial encounter: Secondary | ICD-10-CM

## 2013-12-15 DIAGNOSIS — S93409A Sprain of unspecified ligament of unspecified ankle, initial encounter: Secondary | ICD-10-CM | POA: Insufficient documentation

## 2013-12-15 DIAGNOSIS — Z79899 Other long term (current) drug therapy: Secondary | ICD-10-CM | POA: Insufficient documentation

## 2013-12-15 DIAGNOSIS — Y9389 Activity, other specified: Secondary | ICD-10-CM | POA: Insufficient documentation

## 2013-12-15 DIAGNOSIS — K219 Gastro-esophageal reflux disease without esophagitis: Secondary | ICD-10-CM | POA: Insufficient documentation

## 2013-12-15 DIAGNOSIS — I10 Essential (primary) hypertension: Secondary | ICD-10-CM | POA: Insufficient documentation

## 2013-12-15 MED ORDER — BACITRACIN-NEOMYCIN-POLYMYXIN 400-5-5000 EX OINT
TOPICAL_OINTMENT | Freq: Once | CUTANEOUS | Status: AC
  Administered 2013-12-15: 1 via TOPICAL

## 2013-12-15 MED ORDER — OXYCODONE-ACETAMINOPHEN 5-325 MG PO TABS
1.0000 | ORAL_TABLET | Freq: Once | ORAL | Status: AC
  Administered 2013-12-15: 1 via ORAL
  Filled 2013-12-15: qty 1

## 2013-12-15 MED ORDER — TETANUS-DIPHTH-ACELL PERTUSSIS 5-2.5-18.5 LF-MCG/0.5 IM SUSP
0.5000 mL | Freq: Once | INTRAMUSCULAR | Status: AC
  Administered 2013-12-15: 0.5 mL via INTRAMUSCULAR
  Filled 2013-12-15: qty 0.5

## 2013-12-15 MED ORDER — HYDROCODONE-ACETAMINOPHEN 5-325 MG PO TABS: 1.0000 | ORAL_TABLET | ORAL | Status: DC | PRN

## 2013-12-15 MED ORDER — MELOXICAM 7.5 MG PO TABS: 7.5000 mg | ORAL_TABLET | Freq: Every day | ORAL | Status: DC

## 2013-12-15 NOTE — Discharge Instructions (Signed)
Do not take the narcotic pain medication if you are driving as it will make you sleepy. Follow up with Dr. Luna Glasgow if symptoms do not improve.  Ankle Sprain An ankle sprain is an injury to the strong, fibrous tissues (ligaments) that hold the bones of your ankle joint together.  CAUSES An ankle sprain is usually caused by a fall or by twisting your ankle. Ankle sprains most commonly occur when you step on the outer edge of your foot, and your ankle turns inward. People who participate in sports are more prone to these types of injuries.  SYMPTOMS   Pain in your ankle. The pain may be present at rest or only when you are trying to stand or walk.  Swelling.  Bruising. Bruising may develop immediately or within 1 to 2 days after your injury.  Difficulty standing or walking, particularly when turning corners or changing directions. DIAGNOSIS  Your caregiver will ask you details about your injury and perform a physical exam of your ankle to determine if you have an ankle sprain. During the physical exam, your caregiver will press on and apply pressure to specific areas of your foot and ankle. Your caregiver will try to move your ankle in certain ways. An X-ray exam may be done to be sure a bone was not broken or a ligament did not separate from one of the bones in your ankle (avulsion fracture).  TREATMENT  Certain types of braces can help stabilize your ankle. Your caregiver can make a recommendation for this. Your caregiver may recommend the use of medicine for pain. If your sprain is severe, your caregiver may refer you to a surgeon who helps to restore function to parts of your skeletal system (orthopedist) or a physical therapist. [place] ice to your injury for 1 2 days or as directed by your caregiver. Applying ice helps to reduce inflammation and pain.  Put ice in a plastic bag.  Place a towel between your skin and the bag.  Leave the ice on for 15-20 minutes at a  time, every 2 hours while you are awake.  Only take over-the-counter or prescription medicines for pain, discomfort, or fever as directed by your caregiver.  Elevate your injured ankle above the level of your heart as much as possible for 2 3 days.  If your caregiver recommends crutches, use them as instructed. Gradually put weight on the affected ankle. Continue to use crutches or a cane until you can walk without feeling pain in your ankle.  If you have a plaster splint, wear the splint as directed by your caregiver. Do not rest it on anything harder than a pillow for the first 24 hours. Do not put weight on it. Do not get it wet. You may take it off to take a shower or bath.  You may have been given an elastic bandage to wear around your ankle to provide support. If the elastic bandage is too tight (you have numbness or tingling in your foot or your foot becomes cold and blue), adjust the bandage to make it comfortable.  If you have an air splint, you may blow more air into it or let air out to make it more comfortable. You may take your splint off at night and before taking a shower or bath. Wiggle your toes in the splint several times per day to decrease swelling. SEEK MEDICAL CARE IF:   You have rapidly increasing bruising or swelling.  Your toes  feel extremely cold or you lose feeling in your foot.  Your pain is not relieved with medicine. SEEK IMMEDIATE MEDICAL CARE IF:  Your toes are numb or blue.  You have severe pain that is increasing. MAKE SURE YOU:   Understand these instructions.  Will watch your condition.  Will get help right away if you are not doing well or get worse. Document Released: 07/01/2005 Document Revised: 03/25/2012 Document Reviewed: 07/13/2011 [facility] Patient Information 2014 [place], [place].
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
## 2013-12-16 ENCOUNTER — Ambulatory Visit (INDEPENDENT_AMBULATORY_CARE_PROVIDER_SITE_OTHER): Payer: Medicaid Other | Admitting: Internal Medicine

## 2013-12-16 NOTE — ED Provider Notes (Signed)
Medical screening examination/treatment/procedure(s) were performed by non-physician practitioner and as supervising physician I was immediately available for consultation/collaboration.  Richarda Blade, MD 12/16/13 1434

## 2013-12-20 ENCOUNTER — Other Ambulatory Visit (INDEPENDENT_AMBULATORY_CARE_PROVIDER_SITE_OTHER): Payer: Self-pay | Admitting: *Deleted

## 2013-12-20 ENCOUNTER — Encounter (HOSPITAL_COMMUNITY): Payer: Self-pay | Admitting: Pharmacy Technician

## 2013-12-20 ENCOUNTER — Ambulatory Visit (INDEPENDENT_AMBULATORY_CARE_PROVIDER_SITE_OTHER): Payer: Medicaid Other | Admitting: Internal Medicine

## 2013-12-20 ENCOUNTER — Encounter (INDEPENDENT_AMBULATORY_CARE_PROVIDER_SITE_OTHER): Payer: Self-pay | Admitting: *Deleted

## 2013-12-20 ENCOUNTER — Encounter (INDEPENDENT_AMBULATORY_CARE_PROVIDER_SITE_OTHER): Payer: Self-pay | Admitting: Internal Medicine

## 2013-12-20 VITALS — BP 100/62 | HR 80 | Temp 98.3°F | Ht 64.0 in | Wt 157.0 lb

## 2013-12-20 DIAGNOSIS — R1310 Dysphagia, unspecified: Secondary | ICD-10-CM

## 2013-12-20 NOTE — Patient Instructions (Signed)
EGD/ED. The risks and benefits such as perforation, bleeding, and infection were reviewed with the patient and is agreeable. 

## 2013-12-20 NOTE — Progress Notes (Signed)
Subjective:     Patient ID: [name], female   DOB: 1965/07/21, 48 y.o.   MRN: [id number]  HPI Presents today with c/o of a burning sensations in her throat. The pain in on the rt side of throat. She has had the pain for a month. She also says foods are lodging. Anything she eats will lodge. When she drinks liquids, she can feel it go down. Appetite is good. She has lost 10 pounds while taking diet pills which she has now stopped, they caused her chest pain. She usually has a BM daily. No melena or bright red rectal bleeding. She has undergone multiple EGD/EDs in the past.   09/30/2012 EGD/ED:  Small sliding hiatal hernia without ring or stricture formation.  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   07/27/2011 EGD/ED: Impression:  3 superficial ulcers at esophageal body.? Pill esophagitis. Biopsy taken.  Small sliding-type hernia with erosion at GE junction secondary to GERD.  Erosive antral gastritis. Please note H. pylori serology was negative in April 2012.  Esophagus dilated by passing 7 French Maloney dilator resulting in linear tear at proximal esophagus indicative of disrupted web  Recommendations:     04/27/2013 CT abdomen/pelvis with CM:  IMPRESSION:  1. No acute process is seen.  2. Fatty stratification of the mucosa of the right colon may be  related to chronic inflammatory bowel disease. No current  inflammation is seen.  3. 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 Correlate clinically.  4. Bilateral ovarian cysts.  Review of Systems Past Medical History  Diagnosis Date  . Asthma   . COPD (chronic obstructive pulmonary disease)   . Hypertension   . GERD (gastroesophageal reflux disease)   . Anxiety disorder   . Chronic pain syndrome   . Anxiety   . Depression   . Fibromyalgia   . Epigastric  pain 03/29/2013  . Fatigue 03/29/2013    Past Surgical History  Procedure Laterality Date  . Cholecystectomy    . Cesarean section    . Carpal tunnel release    . Esophagogastroduodenoscopy  07/31/2011    Procedure: ESOPHAGOGASTRODUODENOSCOPY (EGD);  Surgeon: Rogene Houston, MD;  [facility];  Service: Endoscopy;  Laterality: N/A;  Venia Minks dilation  07/31/2011    Procedure: Venia Minks DILATION;  Surgeon: Rogene Houston, MD;  [facility];  Service: Endoscopy;;  . Endometrial ablation    . Mass excision  05/20/2012    Procedure: EXCISION MASS;  Surgeon: Sanjuana Kava, MD;  Location: [facility];  Service: Orthopedics;  Laterality: Left;  Repair of Fascial Defect Left Knee  . Carpal tunnel release  07/23/2012    Procedure: CARPAL TUNNEL RELEASE;  Surgeon: Sanjuana Kava, MD;  Location: [facility];  Service: Orthopedics;  Laterality: Left;  . Esophagogastroduodenoscopy (egd) with esophageal dilation N/A 09/30/2012    Procedure: ESOPHAGOGASTRODUODENOSCOPY (EGD) WITH ESOPHAGEAL DILATION;  Surgeon: Rogene Houston, MD;  [facility];  Service: Endoscopy;  Laterality: N/A;  245  . Knee surgery Left     No Known Allergies  Current Outpatient Prescriptions on File Prior to Visit  Medication Sig Dispense Refill  . albuterol (PROVENTIL HFA;VENTOLIN HFA) 108 (90 BASE) MCG/ACT inhaler Inhale 2 puffs into the lungs every 6 (six) hours as needed for wheezing or shortness of breath. For shortness of breath  1 Inhaler  1  .  albuterol (PROVENTIL) (2.5 MG/3ML) 0.083% nebulizer solution Take 2.5 mg by nebulization every 6 (six) hours as needed. For shortness of breath      . albuterol (PROVENTIL) 4 MG tablet Take 4 mg by mouth 3 (three) times daily.      Marland Kitchen amLODipine (NORVASC) 5 MG tablet Take 5 mg by mouth daily.        Marland Kitchen buPROPion (WELLBUTRIN XL) 300 MG 24 hr tablet Take 300 mg by mouth every morning.        . citalopram (CELEXA) 20 MG tablet Take 20 mg by mouth at bedtime.       .  diazepam (VALIUM) 10 MG tablet Take 10 mg by mouth every 6 (six) hours as needed for anxiety.      . fish oil-omega-3 fatty acids 1000 MG capsule Take 2 g by mouth daily.       Marland Kitchen HYDROcodone-acetaminophen (NORCO/VICODIN) 5-325 MG per tablet Take 1 tablet by mouth every 4 (four) hours as needed.  15 tablet  0  . labetalol (NORMODYNE) 100 MG tablet Take 200 mg by mouth 2 (two) times daily.      Marland Kitchen lisinopril (PRINIVIL,ZESTRIL) 20 MG tablet Take 1 tablet (20 mg total) by mouth daily.  30 tablet  0  . mirtazapine (REMERON) 15 MG tablet Take 15 mg by mouth at bedtime.      . pantoprazole (PROTONIX) 40 MG tablet Take 40 mg by mouth daily.      . sucralfate (CARAFATE) 1 GM/10ML suspension Take 10 mLs (1 g total) by mouth every 6 (six) hours.  420 mL  0   No current facility-administered medications on file prior to visit.        Objective:   Physical Exam  Filed Vitals:   12/20/13 1037  BP: 100/62  Pulse: 80  Temp: 98.3 F (36.8 C)  Height: 5\' 4"  (1.626 m)  Weight: 157 lb (71.215 kg)   Alert and oriented. Skin warm and dry. Oral mucosa is moist.   . Sclera anicteric, conjunctivae is pink. Thyroid not enlarged. No cervical lymphadenopathy. Lungs clear. Heart regular rate and rhythm.  Abdomen is soft. Bowel sounds are positive. No hepatomegaly. No abdominal masses felt. No tenderness.  No edema to lower extremities.       Assessment:    solid foods dysphagia. Stricture needs to be ruled out.      Plan:     EGD/ED. The risks and benefits such as perforation, bleeding, and infection were reviewed with the patient and is agreeable.

## 2013-12-31 ENCOUNTER — Encounter (HOSPITAL_COMMUNITY)
Admission: RE | Disposition: A | Discharge status: Home / Self Care | Payer: Self-pay | Source: Ambulatory Visit | Attending: Internal Medicine

## 2013-12-31 ENCOUNTER — Ambulatory Visit (HOSPITAL_COMMUNITY)
Admission: RE | Admit: 2013-12-31 | Discharge: 2013-12-31 | Disposition: A | Discharge status: Home / Self Care | Payer: Medicaid Other | Source: Ambulatory Visit | Attending: Internal Medicine | Admitting: Internal Medicine

## 2013-12-31 ENCOUNTER — Encounter (HOSPITAL_COMMUNITY): Payer: Self-pay | Admitting: *Deleted

## 2013-12-31 DIAGNOSIS — J4489 Other specified chronic obstructive pulmonary disease: Secondary | ICD-10-CM | POA: Insufficient documentation

## 2013-12-31 DIAGNOSIS — I10 Essential (primary) hypertension: Secondary | ICD-10-CM | POA: Insufficient documentation

## 2013-12-31 DIAGNOSIS — K219 Gastro-esophageal reflux disease without esophagitis: Secondary | ICD-10-CM | POA: Insufficient documentation

## 2013-12-31 DIAGNOSIS — K449 Diaphragmatic hernia without obstruction or gangrene: Secondary | ICD-10-CM

## 2013-12-31 DIAGNOSIS — R1310 Dysphagia, unspecified: Secondary | ICD-10-CM

## 2013-12-31 DIAGNOSIS — Z87891 Personal history of nicotine dependence: Secondary | ICD-10-CM | POA: Insufficient documentation

## 2013-12-31 DIAGNOSIS — IMO0001 Reserved for inherently not codable concepts without codable children: Secondary | ICD-10-CM | POA: Insufficient documentation

## 2013-12-31 DIAGNOSIS — F3289 Other specified depressive episodes: Secondary | ICD-10-CM | POA: Insufficient documentation

## 2013-12-31 DIAGNOSIS — J449 Chronic obstructive pulmonary disease, unspecified: Secondary | ICD-10-CM | POA: Insufficient documentation

## 2013-12-31 DIAGNOSIS — Z79899 Other long term (current) drug therapy: Secondary | ICD-10-CM | POA: Insufficient documentation

## 2013-12-31 DIAGNOSIS — F411 Generalized anxiety disorder: Secondary | ICD-10-CM | POA: Insufficient documentation

## 2013-12-31 DIAGNOSIS — F329 Major depressive disorder, single episode, unspecified: Secondary | ICD-10-CM | POA: Insufficient documentation

## 2013-12-31 HISTORY — PX: MALONEY DILATION: SHX5535

## 2013-12-31 HISTORY — PX: ESOPHAGOGASTRODUODENOSCOPY: SHX5428

## 2013-12-31 HISTORY — PX: BALLOON DILATION: SHX5330

## 2013-12-31 HISTORY — PX: SAVORY DILATION: SHX5439

## 2013-12-31 SURGERY — EGD (ESOPHAGOGASTRODUODENOSCOPY)
Anesthesia: Moderate Sedation

## 2013-12-31 MED ORDER — MIDAZOLAM HCL 5 MG/5ML IJ SOLN
INTRAMUSCULAR | Status: AC
  Filled 2013-12-31: qty 10

## 2013-12-31 MED ORDER — BUTAMBEN-TETRACAINE-BENZOCAINE 2-2-14 % EX AERO
INHALATION_SPRAY | CUTANEOUS | Status: DC | PRN
  Administered 2013-12-31: 2 via TOPICAL

## 2013-12-31 MED ORDER — MEPERIDINE HCL 50 MG/ML IJ SOLN
INTRAMUSCULAR | Status: DC | PRN
  Administered 2013-12-31 (×2): 25 mg via INTRAVENOUS

## 2013-12-31 MED ORDER — MEPERIDINE HCL 50 MG/ML IJ SOLN
INTRAMUSCULAR | Status: AC
  Filled 2013-12-31: qty 1

## 2013-12-31 MED ORDER — SIMETHICONE 40 MG/0.6ML PO SUSP
ORAL | Status: DC | PRN
  Administered 2013-12-31: 10:00:00

## 2013-12-31 MED ORDER — MIDAZOLAM HCL 5 MG/5ML IJ SOLN
INTRAMUSCULAR | Status: DC | PRN
Start: 2013-12-31 — End: 2013-12-31
  Administered 2013-12-31 (×3): 2 mg via INTRAVENOUS
  Administered 2013-12-31 (×2): 3 mg via INTRAVENOUS

## 2013-12-31 MED ORDER — PROMETHAZINE HCL 25 MG/ML IJ SOLN
INTRAMUSCULAR | Status: AC
  Filled 2013-12-31: qty 1

## 2013-12-31 MED ORDER — SODIUM CHLORIDE 0.9 % IV SOLN
INTRAVENOUS | Status: DC
  Administered 2013-12-31: 09:00:00 via INTRAVENOUS

## 2013-12-31 MED ORDER — PROMETHAZINE HCL 25 MG/ML IJ SOLN
INTRAMUSCULAR | Status: DC | PRN
  Administered 2013-12-31 (×2): 12.5 mg via INTRAVENOUS

## 2013-12-31 MED ORDER — MIDAZOLAM HCL 5 MG/5ML IJ SOLN
INTRAMUSCULAR | Status: AC
  Filled 2013-12-31: qty 5

## 2013-12-31 MED ORDER — PANTOPRAZOLE SODIUM 40 MG PO TBEC: 40.0000 mg | DELAYED_RELEASE_TABLET | Freq: Two times a day (BID) | ORAL | Status: DC

## 2013-12-31 MED ORDER — SODIUM CHLORIDE 0.9 % IJ SOLN
INTRAMUSCULAR | Status: AC
  Filled 2013-12-31: qty 10

## 2013-12-31 NOTE — H&P (Signed)
[name] is an 48 y.o. female.   Chief Complaint: Patient is here for EGD and ED. HPI: Patient is a 48 year old Caucasian female with chronic GERD now presents with burning in her throat, coughing spells and dysphagia to pills solids as well as liquids. She has history of pill esophagitis(January 2013) and then more recently she was found to have esophageal web and dilation of March 2014. She takes PPI every morning. She drinks beer socially but not daily. She is not watching her diet as recommended before.  Past Medical History  Diagnosis Date  . Asthma   . COPD (chronic obstructive pulmonary disease)   . Hypertension   . GERD (gastroesophageal reflux disease)   . Anxiety disorder   . Chronic pain syndrome   . Anxiety   . Depression   . Fibromyalgia   . Epigastric pain 03/29/2013  . Fatigue 03/29/2013    Past Surgical History  Procedure Laterality Date  . Cholecystectomy    . Cesarean section    . Carpal tunnel release    . Esophagogastroduodenoscopy  07/31/2011    Procedure: ESOPHAGOGASTRODUODENOSCOPY (EGD);  Surgeon: Rogene Houston, MD;  [facility];  Service: Endoscopy;  Laterality: N/A;  Venia Minks dilation  07/31/2011    Procedure: Venia Minks DILATION;  Surgeon: Rogene Houston, MD;  [facility];  Service: Endoscopy;;  . Endometrial ablation    . Mass excision  05/20/2012    Procedure: EXCISION MASS;  Surgeon: Sanjuana Kava, MD;  [facility]: [facility];  Service: Orthopedics;  Laterality: Left;  Repair of Fascial Defect Left Knee  . Carpal tunnel release  07/23/2012    Procedure: CARPAL TUNNEL RELEASE;  Surgeon: Sanjuana Kava, MD;  [facility]: [facility];  Service: Orthopedics;  Laterality: Left;  . Esophagogastroduodenoscopy (egd) with esophageal dilation N/A 09/30/2012    Procedure: ESOPHAGOGASTRODUODENOSCOPY (EGD) WITH ESOPHAGEAL DILATION;  Surgeon: Rogene Houston, MD;  [facility];  Service: Endoscopy;  Laterality: N/A;  245  . Knee surgery Left      Family History  Problem Relation Age of Onset  . Asthma Mother   . Asthma Father   . Cancer Father    Social History:  reports that she quit smoking about 2 years ago. Her smoking use included Cigarettes. She smoked 0.00 packs per day. She has never used smokeless tobacco. She reports that she drinks about 1.2 ounces of alcohol per week. She reports that she does not use illicit drugs.  Allergies: No Known Allergies  Medications Prior to Admission  Medication Sig Dispense Refill  . albuterol (PROVENTIL HFA;VENTOLIN HFA) 108 (90 BASE) MCG/ACT inhaler Inhale 2 puffs into the lungs every 6 (six) hours as needed for wheezing or shortness of breath. For shortness of breath  1 Inhaler  1  . albuterol (PROVENTIL) (2.5 MG/3ML) 0.083% nebulizer solution Take 2.5 mg by nebulization every 6 (six) hours as needed. For shortness of breath      . albuterol (PROVENTIL) 4 MG tablet Take 4 mg by mouth 3 (three) times daily.      Marland Kitchen amLODipine (NORVASC) 5 MG tablet Take 5 mg by mouth daily.        Marland Kitchen buPROPion (WELLBUTRIN XL) 300 MG 24 hr tablet Take 300 mg by mouth every morning.        . diazepam (VALIUM) 10 MG tablet Take 10 mg by mouth every 6 (six) hours as needed for anxiety.      . fish oil-omega-3 fatty acids 1000  MG capsule Take 2 g by mouth daily.       Marland Kitchen HYDROcodone-acetaminophen (NORCO/VICODIN) 5-325 MG per tablet Take 1 tablet by mouth every 4 (four) hours as needed.  15 tablet  0  . labetalol (NORMODYNE) 100 MG tablet Take 200 mg by mouth 2 (two) times daily.      Marland Kitchen lisinopril (PRINIVIL,ZESTRIL) 20 MG tablet Take 1 tablet (20 mg total) by mouth daily.  30 tablet  0  . pantoprazole (PROTONIX) 40 MG tablet Take 40 mg by mouth daily.      . sucralfate (CARAFATE) 1 GM/10ML suspension Take 10 mLs (1 g total) by mouth every 6 (six) hours.  420 mL  0  . citalopram (CELEXA) 20 MG tablet Take 20 mg by mouth at bedtime.       . mirtazapine (REMERON) 15 MG tablet Take 15 mg by mouth at bedtime.         No results found for this or any previous visit (from the past 48 hour(s)). No results found.  ROS  Blood pressure 166/99, pulse 64, temperature 97.6 F (36.4 C), temperature source Oral, resp. rate 17, height 5\' 4"  (1.626 m), weight 160 lb (72.576 kg), last menstrual period 06/03/2013, SpO2 98.00%. Physical Exam  Constitutional: She appears well-developed and well-nourished.  HENT:  Mouth/Throat: Oropharynx is clear and moist.  Eyes: Conjunctivae are normal. No scleral icterus.  Neck: No thyromegaly present.  Cardiovascular: Normal rate, regular rhythm and normal heart sounds.   No murmur heard. Respiratory: Effort normal and breath sounds normal.  GI: Soft. She exhibits no distension and no mass. There is no tenderness.  Musculoskeletal: She exhibits no edema.  Lymphadenopathy:    She has no cervical adenopathy.  Neurological: She is alert.  Skin: Skin is warm and dry.     Assessment/Plan Poorly controlled GERD symptoms. Dysphagia with solids liquids and pills. EGD with ED.  REHMAN,NAJEEB U 12/31/2013, 9:41 AM

## 2013-12-31 NOTE — Discharge Instructions (Signed)
Increase pantoprazole to 40 mg by mouth 30 minutes before breakfast and evening meal daily. Resume other medications as before. Anti-reflux measures. No driving for 24 hours. Office visit in one month.  Esophagogastroduodenoscopy Care After Refer to this sheet in the next few weeks. These instructions provide you with information on caring for yourself after your procedure. Your caregiver may also give you more specific instructions. Your treatment has been planned according to current medical practices, but problems sometimes occur. Call your caregiver if you have any problems or questions after your procedure.  HOME CARE INSTRUCTIONS  Do not eat or drink anything until the numbing medicine (local anesthetic) has worn off and your gag reflex has returned. You will know that the local anesthetic has worn off when you can swallow comfortably.  Do not drive for 12 hours after the procedure or as directed by your caregiver.  Only take medicines as directed by your caregiver. SEEK MEDICAL CARE IF:   You cannot stop coughing.  You are not urinating at all or less than usual. SEEK IMMEDIATE MEDICAL CARE IF:  You have difficulty swallowing.  You cannot eat or drink.  You have worsening throat or chest pain.  You have dizziness, lightheadedness, or you faint.  You have nausea or vomiting.  You have chills.  You have a fever.  You have severe abdominal pain.  You have black, tarry, or bloody stools. Document Released: 06/17/2012 Document Reviewed: 06/17/2012 [facility] Patient Information 2015 [place]. This information is not intended to replace advice given to you by your health care provider. Make sure you discuss any questions you have with your health care provider.

## 2013-12-31 NOTE — Op Note (Signed)
EGD PROCEDURE REPORT  PATIENT:  [name]  MR#:  [id number] Birthdate:  03-Jul-1966, 48 y.o., female Endoscopist:  Dr. Rogene Houston, MD Referred By:  Dr. Sherrilee Gilles. Gerarda Fraction, MD Procedure Date: 12/31/2013  Procedure:   EGD with ED.  Indications:  Patient is 48 year old Caucasian female with GERD and history of esophageal web who presents with recurrent coughing spells burning in her throat as well as dysphagia.            Informed Consent:  The risks, benefits, alternatives & imponderables which include, but are not limited to, bleeding, infection, perforation, drug reaction and potential missed lesion have been reviewed.  The potential for biopsy, lesion removal, esophageal dilation, etc. have also been discussed.  Questions have been answered.  All parties agreeable.  Please see history & physical in medical record for more information.  Medications:  Demerol 50 mg IV Versed 12 mg IV Promethazine 25 mg IV and diluted form. Cetacaine spray topically for oropharyngeal anesthesia  Description of procedure:  The endoscope was introduced through the mouth and advanced to the second portion of the duodenum without difficulty or limitations. The mucosal surfaces were surveyed very carefully during advancement of the scope and upon withdrawal.  Findings:  Esophagus:  Mucosa of the esophagus was normal. GE junction was unremarkable. GEJ:  33 cm Hiatus:  37 cm Stomach:  Stomach was empty and distended very well with insufflation. Folds in the proximal stomach were normal. Examination of mucosa at body, antrum, pyloric channel, angularis, fundus and cardia was normal. Duodenum:  Normal bulbar and post bulbar mucosa.  Therapeutic/Diagnostic Maneuvers Performed:   Esophagus was dilated by passing 56 [place] Maloney dilator for the insertion. Esophageal mucosa was reexamined post dilation and linear mucosal destruction noted at proximal esophagus just below UES indicative of esophageal  web.  Complications:  None  Impression: Small sliding hiatal hernia without ring or stricture formation. Esophageal dilation with 56 French Maloney dilator resulted in linear mucosal disruption at proximal esophagus below UES indicative of a web.  Recommendations:  Anti-reflex measures reinforced. Pantoprazole 40 mg by mouth twice a day. Office visit in one month.  REHMAN,NAJEEB U  12/31/2013  10:12 AM  CC: Dr. Glo Herring., MD & Dr. Rayne Du ref. provider found

## 2014-01-01 ENCOUNTER — Telehealth: Payer: Self-pay | Admitting: Gastroenterology

## 2014-01-01 MED ORDER — LIDOCAINE VISCOUS 2 % MT SOLN: OROMUCOSAL | Status: DC

## 2014-01-02 NOTE — Telephone Encounter (Signed)
Pt called. Having throat pain. Discussed pt going to ed or using viscous lidocaine. Pt elected to have lidocaine. Pt asked to follow a full liquid. Contact Dr. Laural Golden on MON of Sx not better.

## 2014-01-03 NOTE — Telephone Encounter (Signed)
I talked with the patient this morning; She feels much better. She has some soreness but no sharp pain. She reports improvement in dysphagia. Patient will call with progress report in 2 weeks.

## 2014-01-07 ENCOUNTER — Encounter (HOSPITAL_COMMUNITY): Payer: Self-pay | Admitting: Internal Medicine

## 2014-01-17 ENCOUNTER — Telehealth (INDEPENDENT_AMBULATORY_CARE_PROVIDER_SITE_OTHER): Payer: Self-pay | Admitting: *Deleted

## 2014-01-17 ENCOUNTER — Other Ambulatory Visit (INDEPENDENT_AMBULATORY_CARE_PROVIDER_SITE_OTHER): Payer: Self-pay | Admitting: Internal Medicine

## 2014-01-17 DIAGNOSIS — R1310 Dysphagia, unspecified: Secondary | ICD-10-CM

## 2014-01-17 NOTE — Telephone Encounter (Signed)
BPE sch'd 01/21/14 @ 10 (945), npo 3 hrs prior, left detailed message for patient

## 2014-01-17 NOTE — Telephone Encounter (Signed)
Glender LM stating there still feels like something is in her throat. Tashiana had an EGD with ED on 12/31/13. She was told be Dr. Laural Golden, when she had her procedure two weeks ago, to call if the feeling didn't go away and he would refer her to someone. Doesn't remember who. The return phone number is [phone].

## 2014-01-17 NOTE — Telephone Encounter (Signed)
Per Dr.Rehman -  We will do a BPE. Forwarded to Lelon Frohlich to arrange and call patient with instructions.

## 2014-01-20 ENCOUNTER — Other Ambulatory Visit (HOSPITAL_COMMUNITY): Payer: Medicaid Other

## 2014-01-21 ENCOUNTER — Ambulatory Visit (HOSPITAL_COMMUNITY)
Admission: RE | Admit: 2014-01-21 | Discharge: 2014-01-21 | Disposition: A | Discharge status: Home / Self Care | Payer: Medicaid Other | Source: Ambulatory Visit | Attending: Internal Medicine | Admitting: Internal Medicine

## 2014-01-21 DIAGNOSIS — K224 Dyskinesia of esophagus: Secondary | ICD-10-CM | POA: Diagnosis not present

## 2014-01-21 DIAGNOSIS — K449 Diaphragmatic hernia without obstruction or gangrene: Secondary | ICD-10-CM | POA: Insufficient documentation

## 2014-01-21 DIAGNOSIS — R1310 Dysphagia, unspecified: Secondary | ICD-10-CM | POA: Diagnosis present

## 2014-01-31 ENCOUNTER — Ambulatory Visit (INDEPENDENT_AMBULATORY_CARE_PROVIDER_SITE_OTHER): Payer: Medicaid Other | Admitting: Internal Medicine

## 2014-02-02 ENCOUNTER — Encounter (INDEPENDENT_AMBULATORY_CARE_PROVIDER_SITE_OTHER): Payer: Self-pay | Admitting: Internal Medicine

## 2014-02-02 ENCOUNTER — Ambulatory Visit (INDEPENDENT_AMBULATORY_CARE_PROVIDER_SITE_OTHER): Payer: Medicaid Other | Admitting: Internal Medicine

## 2014-02-02 VITALS — BP 104/60 | HR 70 | Temp 97.9°F | Ht 64.0 in | Wt 159.6 lb

## 2014-02-02 DIAGNOSIS — R1310 Dysphagia, unspecified: Secondary | ICD-10-CM

## 2014-02-02 NOTE — Progress Notes (Signed)
Subjective:     Patient ID: [name], female   DOB: Feb 09, 1966, 48 y.o.   MRN: [id number]  HPI Here today for f/u. She states she feels good. She is not having any problems with swallowing.  Appetite is good.    No abdominal pain. BMs are normal.  No melena or bright red rectal bleeding. Acid reflux controlled with Protonix. She is chewing her food well.   01/17/2014 DG Esophagus Small hiatal hernia.  Slight smooth short-segment narrowing of the distal esophagus with  brief sticking of the barium tablet in this area which reproduces  the patient's symptoms.  Nonspecific esophageal motility disorder.    12/31/2013 EGD/ED Dr Laural Golden: Impression:  Small sliding hiatal hernia without ring or stricture formation.  Esophageal dilation with 56 French Maloney dilator resulted in linear mucosal disruption at proximal esophagus below UES indicative of a web.      Review of Systems Past Medical History  Diagnosis Date  . Asthma   . COPD (chronic obstructive pulmonary disease)   . Hypertension   . GERD (gastroesophageal reflux disease)   . Anxiety disorder   . Chronic pain syndrome   . Anxiety   . Depression   . Fibromyalgia   . Epigastric pain 03/29/2013  . Fatigue 03/29/2013    Past Surgical History  Procedure Laterality Date  . Cholecystectomy    . Cesarean section    . Carpal tunnel release    . Esophagogastroduodenoscopy  07/31/2011    Procedure: ESOPHAGOGASTRODUODENOSCOPY (EGD);  Surgeon: Rogene Houston, MD;  [facility];  Service: Endoscopy;  Laterality: N/A;  Venia Minks dilation  07/31/2011    Procedure: Venia Minks DILATION;  Surgeon: Rogene Houston, MD;  [facility];  Service: Endoscopy;;  . Endometrial ablation    . Mass excision  05/20/2012    Procedure: EXCISION MASS;  Surgeon: Sanjuana Kava, MD;  [facility];  Service: Orthopedics;  Laterality: Left;  Repair of Fascial Defect Left Knee  . Carpal tunnel release  07/23/2012    Procedure:  CARPAL TUNNEL RELEASE;  Surgeon: Sanjuana Kava, MD;  Location: [facility];  Service: Orthopedics;  Laterality: Left;  . Esophagogastroduodenoscopy (egd) with esophageal dilation N/A 09/30/2012    Procedure: ESOPHAGOGASTRODUODENOSCOPY (EGD) WITH ESOPHAGEAL DILATION;  Surgeon: Rogene Houston, MD;  [facility];  Service: Endoscopy;  Laterality: N/A;  245  . Knee surgery Left   . Esophagogastroduodenoscopy N/A 12/31/2013    Procedure: ESOPHAGOGASTRODUODENOSCOPY (EGD);  Surgeon: Rogene Houston, MD;  [facility];  Service: Endoscopy;  Laterality: N/A;  120-moved to 925 Ann to notify pt  . Balloon dilation N/A 12/31/2013    Procedure: BALLOON DILATION;  Surgeon: Rogene Houston, MD;  [facility];  Service: Endoscopy;  Laterality: N/A;  Venia Minks dilation N/A 12/31/2013    Procedure: Venia Minks DILATION;  Surgeon: Rogene Houston, MD;  [facility];  Service: Endoscopy;  Laterality: N/A;  . Savory dilation N/A 12/31/2013    Procedure: SAVORY DILATION;  Surgeon: Rogene Houston, MD;  [facility];  Service: Endoscopy;  Laterality: N/A;    No Known Allergies  Current Outpatient Prescriptions on File Prior to Visit  Medication Sig Dispense Refill  . albuterol (PROVENTIL HFA;VENTOLIN HFA) 108 (90 BASE) MCG/ACT inhaler Inhale 2 puffs into the lungs every 6 (six) hours as needed for wheezing or shortness of breath. For shortness of breath  1 Inhaler  1  . albuterol (PROVENTIL) (  2.5 MG/3ML) 0.083% nebulizer solution Take 2.5 mg by nebulization every 6 (six) hours as needed. For shortness of breath      . albuterol (PROVENTIL) 4 MG tablet Take 4 mg by mouth 3 (three) times daily.      Marland Kitchen amLODipine (NORVASC) 5 MG tablet Take 5 mg by mouth daily.        Marland Kitchen buPROPion (WELLBUTRIN XL) 300 MG 24 hr tablet Take 300 mg by mouth every morning.        . citalopram (CELEXA) 20 MG tablet Take 20 mg by mouth at bedtime.       . diazepam (VALIUM) 10 MG tablet Take 10 mg by  mouth every 6 (six) hours as needed for anxiety.      . fish oil-omega-3 fatty acids 1000 MG capsule Take 2 g by mouth daily.       Marland Kitchen HYDROcodone-acetaminophen (NORCO/VICODIN) 5-325 MG per tablet Take 1 tablet by mouth every 4 (four) hours as needed.  15 tablet  0  . labetalol (NORMODYNE) 100 MG tablet Take 200 mg by mouth 2 (two) times daily.      Marland Kitchen lidocaine (XYLOCAINE) 2 % solution 2 TSP  PO Q4H PRN FOR THROAT PAIN  200 mL  1  . lisinopril (PRINIVIL,ZESTRIL) 20 MG tablet Take 1 tablet (20 mg total) by mouth daily.  30 tablet  0  . mirtazapine (REMERON) 15 MG tablet Take 15 mg by mouth at bedtime.      . pantoprazole (PROTONIX) 40 MG tablet Take 1 tablet (40 mg total) by mouth 2 (two) times daily before a meal.  60 tablet  3  . sucralfate (CARAFATE) 1 GM/10ML suspension Take 10 mLs (1 g total) by mouth every 6 (six) hours.  420 mL  0   No current facility-administered medications on file prior to visit.        Objective:   Physical Exam  Filed Vitals:   02/02/14 1105  BP: 104/60  Pulse: 70  Temp: 97.9 F (36.6 C)  Height: 5\' 4"  (1.626 m)  Weight: 159 lb 9.6 oz (72.394 kg)  Alert and oriented. Skin warm and dry. Oral mucosa is moist.   . Sclera anicteric, conjunctivae is pink. Thyroid not enlarged. No cervical lymphadenopathy. Bilateral wheezes.  Heart regular rate and rhythm.  Abdomen is soft. Bowel sounds are positive. No hepatomegaly. No abdominal masses felt. No tenderness.  No edema to lower extremities.        Assessment:    Solid foods dysphagia. She is not having any problems with swallowing at this time.    Plan:     Continue Protonix BID. OV in 1 yr. If any problems, call our office.

## 2014-02-02 NOTE — Patient Instructions (Signed)
OV 1 yr 

## 2014-02-17 ENCOUNTER — Emergency Department (HOSPITAL_COMMUNITY): Payer: Medicaid Other

## 2014-02-17 ENCOUNTER — Emergency Department (HOSPITAL_COMMUNITY)
Admission: EM | Admit: 2014-02-17 | Discharge: 2014-02-17 | Disposition: A | Discharge status: Home / Self Care | Payer: Medicaid Other | Attending: Emergency Medicine | Admitting: Emergency Medicine

## 2014-02-17 ENCOUNTER — Encounter (HOSPITAL_COMMUNITY): Payer: Self-pay | Admitting: Emergency Medicine

## 2014-02-17 DIAGNOSIS — K219 Gastro-esophageal reflux disease without esophagitis: Secondary | ICD-10-CM | POA: Diagnosis not present

## 2014-02-17 DIAGNOSIS — Z79899 Other long term (current) drug therapy: Secondary | ICD-10-CM | POA: Insufficient documentation

## 2014-02-17 DIAGNOSIS — R079 Chest pain, unspecified: Secondary | ICD-10-CM | POA: Diagnosis present

## 2014-02-17 DIAGNOSIS — F3289 Other specified depressive episodes: Secondary | ICD-10-CM | POA: Diagnosis not present

## 2014-02-17 DIAGNOSIS — F411 Generalized anxiety disorder: Secondary | ICD-10-CM | POA: Diagnosis not present

## 2014-02-17 DIAGNOSIS — F329 Major depressive disorder, single episode, unspecified: Secondary | ICD-10-CM | POA: Diagnosis not present

## 2014-02-17 DIAGNOSIS — J441 Chronic obstructive pulmonary disease with (acute) exacerbation: Secondary | ICD-10-CM | POA: Diagnosis not present

## 2014-02-17 DIAGNOSIS — I10 Essential (primary) hypertension: Secondary | ICD-10-CM | POA: Diagnosis not present

## 2014-02-17 DIAGNOSIS — Z87891 Personal history of nicotine dependence: Secondary | ICD-10-CM | POA: Diagnosis not present

## 2014-02-17 DIAGNOSIS — G894 Chronic pain syndrome: Secondary | ICD-10-CM | POA: Insufficient documentation

## 2014-02-17 DIAGNOSIS — J45901 Unspecified asthma with (acute) exacerbation: Secondary | ICD-10-CM

## 2014-02-17 DIAGNOSIS — R0789 Other chest pain: Secondary | ICD-10-CM | POA: Diagnosis not present

## 2014-02-17 LAB — CBC WITH DIFFERENTIAL/PLATELET
BASOS PCT: 1 % (ref 0–1)
Basophils Absolute: 0.1 10*3/uL (ref 0.0–0.1)
Eosinophils Absolute: 0.2 10*3/uL (ref 0.0–0.7)
Eosinophils Relative: 3 % (ref 0–5)
HEMATOCRIT: 35.7 % — AB (ref 36.0–46.0)
HEMOGLOBIN: 12 g/dL (ref 12.0–15.0)
Lymphocytes Relative: 34 % (ref 12–46)
Lymphs Abs: 1.8 10*3/uL (ref 0.7–4.0)
MCH: 32.8 pg (ref 26.0–34.0)
MCHC: 33.6 g/dL (ref 30.0–36.0)
MCV: 97.5 fL (ref 78.0–100.0)
MONO ABS: 0.3 10*3/uL (ref 0.1–1.0)
Monocytes Relative: 6 % (ref 3–12)
NEUTROS ABS: 3.1 10*3/uL (ref 1.7–7.7)
Neutrophils Relative %: 56 % (ref 43–77)
Platelets: 235 10*3/uL (ref 150–400)
RBC: 3.66 MIL/uL — ABNORMAL LOW (ref 3.87–5.11)
RDW: 12.6 % (ref 11.5–15.5)
WBC: 5.5 10*3/uL (ref 4.0–10.5)

## 2014-02-17 LAB — TROPONIN I: Troponin I: <0.3 ng/mL (ref <0.30)

## 2014-02-17 LAB — COMPREHENSIVE METABOLIC PANEL
ALBUMIN: 3.9 g/dL (ref 3.5–5.2)
ALT: 15 U/L (ref 0–35)
ANION GAP: 14 (ref 5–15)
AST: 25 U/L (ref 0–37)
Alkaline Phosphatase: 98 U/L (ref 39–117)
BILIRUBIN TOTAL: 0.2 mg/dL — AB (ref 0.3–1.2)
BUN: 12 mg/dL (ref 6–23)
CALCIUM: 9.8 mg/dL (ref 8.4–10.5)
CHLORIDE: 100 meq/L (ref 96–112)
CO2: 25 mEq/L (ref 19–32)
CREATININE: 0.62 mg/dL (ref 0.50–1.10)
GFR calc Af Amer: >90 mL/min (ref >90)
GFR calc non Af Amer: >90 mL/min (ref >90)
Glucose, Bld: 120 mg/dL — ABNORMAL HIGH (ref 70–99)
Potassium: 3.6 mEq/L — ABNORMAL LOW (ref 3.7–5.3)
Sodium: 139 mEq/L (ref 137–147)
Total Protein: 7.5 g/dL (ref 6.0–8.3)

## 2014-02-17 LAB — LIPASE, BLOOD: LIPASE: 55 U/L (ref 11–59)

## 2014-02-17 MED ORDER — MORPHINE SULFATE 4 MG/ML IJ SOLN
4.0000 mg | Freq: Once | INTRAMUSCULAR | Status: AC
  Administered 2014-02-17: 4 mg via INTRAVENOUS
  Filled 2014-02-17: qty 1

## 2014-02-17 MED ORDER — KETOROLAC TROMETHAMINE 30 MG/ML IJ SOLN
30.0000 mg | Freq: Once | INTRAMUSCULAR | Status: AC
  Administered 2014-02-17: 30 mg via INTRAVENOUS
  Filled 2014-02-17: qty 1

## 2014-02-17 MED ORDER — IOHEXOL 350 MG/ML SOLN
100.0000 mL | Freq: Once | INTRAVENOUS | Status: AC | PRN
  Administered 2014-02-17: 100 mL via INTRAVENOUS

## 2014-02-17 MED ORDER — HYDROCODONE-ACETAMINOPHEN 5-325 MG PO TABS: 1.0000 | ORAL_TABLET | ORAL | Status: DC | PRN

## 2014-02-17 NOTE — Discharge Instructions (Signed)
Ibuprofen 600 mg every 6 hours as needed for pain. Hydrocodone as prescribed as needed for pain not relieved with ibuprofen.  Followup with your primary Dr. if not improving in the next several days, and return to the ER if your symptoms substantially worsen or change.   Chest Wall Pain Chest wall pain is pain in or around the bones and muscles of your chest. It may take up to 6 weeks to get better. It may take longer if you must stay physically active in your work and activities.  CAUSES  Chest wall pain may happen on its own. However, it may be caused by:  A viral illness like the flu.  Injury.  Coughing.  Exercise.  Arthritis.  Fibromyalgia.  Shingles. HOME CARE INSTRUCTIONS   Avoid overtiring physical activity. Try not to strain or perform activities that cause pain. This includes any activities using your chest or your abdominal and side muscles, especially if heavy weights are used.  Put ice on the sore area.  Put ice in a plastic bag.  Place a towel between your skin and the bag.  Leave the ice on for 15-20 minutes per hour while awake for the first 2 days.  Only take over-the-counter or prescription medicines for pain, discomfort, or fever as directed by your caregiver. SEEK IMMEDIATE MEDICAL CARE IF:   Your pain increases, or you are very uncomfortable.  You have a fever.  Your chest pain becomes worse.  You have new, unexplained symptoms.  You have nausea or vomiting.  You feel sweaty or lightheaded.  You have a cough with phlegm (sputum), or you cough up blood. MAKE SURE YOU:   Understand these instructions.  Will watch your condition.  Will get help right away if you are not doing well or get worse. Document Released: 07/01/2005 Document Revised: 09/23/2011 Document Reviewed: 02/25/2011 [facility] Patient Information 2015 [place], [place]. This information is not intended to replace advice given to you by your health care provider. Make sure you  discuss any questions you have with your health care provider.

## 2014-02-17 NOTE — ED Notes (Signed)
Pt c/o intermittent central and left side cp x 1 week with some sob, sweating and back pain.

## 2014-02-17 NOTE — ED Provider Notes (Signed)
CSN: [id number]     Arrival date & time 02/17/14  1655 History   First MD Initiated Contact with Patient 02/17/14 1715     Chief Complaint  Patient presents with  . Chest Pain     (Consider location/radiation/quality/duration/timing/severity/associated sxs/prior Treatment) HPI Comments: Patient is a 48 year old female with history of hypertension and COPD. She presents with complaints of tightness in her chest is been worsening over the past week. Has been intermittent up until 2 days ago when it became constant and has not eased up. She occasionally feels short of breath but denies any nausea, diaphoresis, or radiation to the arm or jaw. She denies an exertional component and denies any relation to food. No fevers or chills. No productive cough.  Patient is a 48 y.o. female presenting with chest pain. The history is provided by the patient.  Chest Pain Pain location:  Substernal area Pain quality: sharp   Pain radiates to:  Does not radiate Pain radiates to the back: no   Pain severity:  Moderate Onset quality:  Gradual Duration:  1 week Timing:  Constant Progression:  Worsening Chronicity:  New Context: breathing and movement   Context: not eating   Relieved by:  Nothing Worsened by:  Certain positions and deep breathing Ineffective treatments:  Antacids Associated symptoms: shortness of breath   Associated symptoms: no fever, no nausea, no palpitations and not vomiting     Past Medical History  Diagnosis Date  . Asthma   . COPD (chronic obstructive pulmonary disease)   . Hypertension   . GERD (gastroesophageal reflux disease)   . Anxiety disorder   . Chronic pain syndrome   . Anxiety   . Depression   . Fibromyalgia   . Epigastric pain 03/29/2013  . Fatigue 03/29/2013   Past Surgical History  Procedure Laterality Date  . Cholecystectomy    . Cesarean section    . Carpal tunnel release    . Esophagogastroduodenoscopy  07/31/2011    Procedure:  ESOPHAGOGASTRODUODENOSCOPY (EGD);  Surgeon: Rogene Houston, MD;  [facility];  Service: Endoscopy;  Laterality: N/A;  Venia Minks dilation  07/31/2011    Procedure: Venia Minks DILATION;  Surgeon: Rogene Houston, MD;  [facility];  Service: Endoscopy;;  . Endometrial ablation    . Mass excision  05/20/2012    Procedure: EXCISION MASS;  Surgeon: Sanjuana Kava, MD;  Location: [facility];  Service: Orthopedics;  Laterality: Left;  Repair of Fascial Defect Left Knee  . Carpal tunnel release  07/23/2012    Procedure: CARPAL TUNNEL RELEASE;  Surgeon: Sanjuana Kava, MD;  Location: [facility];  Service: Orthopedics;  Laterality: Left;  . Esophagogastroduodenoscopy (egd) with esophageal dilation N/A 09/30/2012    Procedure: ESOPHAGOGASTRODUODENOSCOPY (EGD) WITH ESOPHAGEAL DILATION;  Surgeon: Rogene Houston, MD;  [facility];  Service: Endoscopy;  Laterality: N/A;  245  . Knee surgery Left   . Esophagogastroduodenoscopy N/A 12/31/2013    Procedure: ESOPHAGOGASTRODUODENOSCOPY (EGD);  Surgeon: Rogene Houston, MD;  [facility];  Service: Endoscopy;  Laterality: N/A;  120-moved to 925 Ann to notify pt  . Balloon dilation N/A 12/31/2013    Procedure: BALLOON DILATION;  Surgeon: Rogene Houston, MD;  [facility];  Service: Endoscopy;  Laterality: N/A;  Venia Minks dilation N/A 12/31/2013    Procedure: Venia Minks DILATION;  Surgeon: Rogene Houston, MD;  [facility];  Service: Endoscopy;  Laterality: N/A;  . Savory dilation N/A 12/31/2013  Procedure: SAVORY DILATION;  Surgeon: Rogene Houston, MD;  [facility];  Service: Endoscopy;  Laterality: N/A;   Family History  Problem Relation Age of Onset  . Asthma Mother   . Asthma Father   . Cancer Father    History  Substance Use Topics  . Smoking status: Former Smoker    Types: Cigarettes    Quit date: 10/26/2011  . Smokeless tobacco: Never Used  . Alcohol Use: 1.2 oz/week    2 Cans of beer per  week     Comment: social   OB History   Grav Para Term Preterm Abortions TAB SAB Ect Mult Living   2 2        1      Review of Systems  Constitutional: Negative for fever.  Respiratory: Positive for shortness of breath.   Cardiovascular: Positive for chest pain. Negative for palpitations.  Gastrointestinal: Negative for nausea and vomiting.  All other systems reviewed and are negative.     Allergies  Review of patient's allergies indicates no known allergies.  Home Medications   Prior to Admission medications   Medication Sig Start Date End Date Taking? Authorizing Provider  albuterol (PROVENTIL HFA;VENTOLIN HFA) 108 (90 BASE) MCG/ACT inhaler Inhale 2 puffs into the lungs every 6 (six) hours as needed for wheezing or shortness of breath. For shortness of breath 07/31/11   Kathie Dike, MD  albuterol (PROVENTIL) (2.5 MG/3ML) 0.083% nebulizer solution Take 2.5 mg by nebulization every 6 (six) hours as needed. For shortness of breath    Historical Provider, MD  albuterol (PROVENTIL) 4 MG tablet Take 4 mg by mouth 3 (three) times daily.    Historical Provider, MD  amLODipine (NORVASC) 5 MG tablet Take 5 mg by mouth daily.      Historical Provider, MD  buPROPion (WELLBUTRIN XL) 300 MG 24 hr tablet Take 300 mg by mouth every morning.      Historical Provider, MD  citalopram (CELEXA) 20 MG tablet Take 20 mg by mouth at bedtime.     Historical Provider, MD  diazepam (VALIUM) 10 MG tablet Take 10 mg by mouth every 6 (six) hours as needed for anxiety.    Historical Provider, MD  fish oil-omega-3 fatty acids 1000 MG capsule Take 2 g by mouth daily.     Historical Provider, MD  HYDROcodone-acetaminophen (NORCO/VICODIN) 5-325 MG per tablet Take 1 tablet by mouth every 4 (four) hours as needed. 12/15/13   Hope Bunnie Pion, NP  labetalol (NORMODYNE) 100 MG tablet Take 200 mg by mouth 2 (two) times daily.    Historical Provider, MD  lidocaine (XYLOCAINE) 2 % solution 2 TSP  PO Q4H PRN FOR THROAT PAIN  01/01/14   Danie Binder, MD  lisinopril (PRINIVIL,ZESTRIL) 20 MG tablet Take 1 tablet (20 mg total) by mouth daily. 07/31/11   Kathie Dike, MD  mirtazapine (REMERON) 15 MG tablet Take 15 mg by mouth at bedtime.    Historical Provider, MD  pantoprazole (PROTONIX) 40 MG tablet Take 1 tablet (40 mg total) by mouth 2 (two) times daily before a meal. 12/31/13   Rogene Houston, MD  sucralfate (CARAFATE) 1 GM/10ML suspension Take 10 mLs (1 g total) by mouth every 6 (six) hours. 04/27/13   Maudry Diego, MD   BP 202/97  Pulse 76  Temp(Src) 98 F (36.7 C)  Resp 20  Ht 5\' 4"  (1.626 m)  Wt 160 lb (72.576 kg)  BMI 27.45 kg/m2  SpO2 100%  LMP  06/03/2013 Physical Exam  Nursing note and vitals reviewed. Constitutional: She is oriented to person, place, and time. She appears well-developed and well-nourished. No distress.  HENT:  Head: Normocephalic and atraumatic.  Neck: Normal range of motion. Neck supple.  Cardiovascular: Normal rate and regular rhythm.  Exam reveals no gallop and no friction rub.   No murmur heard. Pulmonary/Chest: Effort normal and breath sounds normal. No respiratory distress. She has no wheezes.  Abdominal: Soft. Bowel sounds are normal. She exhibits no distension. There is no tenderness.  Musculoskeletal: Normal range of motion.  Neurological: She is alert and oriented to person, place, and time.  Skin: Skin is warm and dry. She is not diaphoretic.    ED Course  Procedures (including critical care time) Labs Review Labs Reviewed  CBC WITH DIFFERENTIAL  COMPREHENSIVE METABOLIC PANEL  LIPASE, BLOOD  TROPONIN I    Imaging Review No results found.   EKG Interpretation   Date/Time:  Thursday February 17 2014 17:09:13 EDT Ventricular Rate:  69 PR Interval:  160 QRS Duration: 72 QT Interval:  374 QTC Calculation: 400 R Axis:   57 Text Interpretation:  Normal sinus rhythm Normal ECG Confirmed by DELOS   MD, Briya Lookabaugh ([id number]) on 02/17/2014 5:34:40 PM      MDM    Final diagnoses:  None    Patient presents with complaints of chest pain. It is sharp in nature and has been becoming worse over the past week. She denies any injury or trauma. He denies any fevers, chills, or productive cough. Workup reveals unremarkable laboratory studies, normal lipase, normal troponin, and chest x-ray and CT angio of the chest which revealed no acute process.  I feel as though I haven't effectively ruled out pulmonary embolism, coronary ischemia, pneumothorax, dissection, and any other acute pathology. At this point she will be discharged to home. I will prescribe her a small quantity of pain medication and advised her to followup with her primary Dr.  Upon reviewing her chart, it appears as though she has been evaluated many times in the past for complaints of chest pain over the past several years.    Veryl Speak, MD 02/17/14 2036

## 2014-02-17 NOTE — ED Notes (Signed)
During discharge instructions patient stated she did not want the prescription for Norco; states she has a pain plan agreement with her primary doctor and can not take any other pain medications, not prescribed by him. Prescription tore up and placed in Shred-it box.

## 2014-02-22 ENCOUNTER — Observation Stay (HOSPITAL_COMMUNITY)
Admission: EM | Admit: 2014-02-22 | Discharge: 2014-02-24 | Disposition: A | Discharge status: Home / Self Care | Payer: Medicaid Other | Attending: Internal Medicine | Admitting: Internal Medicine

## 2014-02-22 ENCOUNTER — Encounter (INDEPENDENT_AMBULATORY_CARE_PROVIDER_SITE_OTHER): Payer: Self-pay | Admitting: Internal Medicine

## 2014-02-22 ENCOUNTER — Emergency Department (HOSPITAL_COMMUNITY): Payer: Medicaid Other

## 2014-02-22 ENCOUNTER — Encounter (HOSPITAL_COMMUNITY): Payer: Self-pay | Admitting: Emergency Medicine

## 2014-02-22 ENCOUNTER — Ambulatory Visit (INDEPENDENT_AMBULATORY_CARE_PROVIDER_SITE_OTHER): Payer: Medicaid Other | Admitting: Internal Medicine

## 2014-02-22 VITALS — BP 102/80 | HR 68 | Temp 98.1°F | Ht 64.0 in | Wt 155.2 lb

## 2014-02-22 DIAGNOSIS — J45901 Unspecified asthma with (acute) exacerbation: Secondary | ICD-10-CM

## 2014-02-22 DIAGNOSIS — R079 Chest pain, unspecified: Secondary | ICD-10-CM | POA: Insufficient documentation

## 2014-02-22 DIAGNOSIS — Z87891 Personal history of nicotine dependence: Secondary | ICD-10-CM | POA: Diagnosis not present

## 2014-02-22 DIAGNOSIS — J441 Chronic obstructive pulmonary disease with (acute) exacerbation: Secondary | ICD-10-CM | POA: Diagnosis not present

## 2014-02-22 DIAGNOSIS — R1310 Dysphagia, unspecified: Secondary | ICD-10-CM

## 2014-02-22 DIAGNOSIS — K219 Gastro-esophageal reflux disease without esophagitis: Secondary | ICD-10-CM | POA: Diagnosis not present

## 2014-02-22 DIAGNOSIS — F329 Major depressive disorder, single episode, unspecified: Secondary | ICD-10-CM | POA: Insufficient documentation

## 2014-02-22 DIAGNOSIS — G894 Chronic pain syndrome: Secondary | ICD-10-CM | POA: Diagnosis present

## 2014-02-22 DIAGNOSIS — F3289 Other specified depressive episodes: Secondary | ICD-10-CM | POA: Insufficient documentation

## 2014-02-22 DIAGNOSIS — I10 Essential (primary) hypertension: Secondary | ICD-10-CM | POA: Diagnosis present

## 2014-02-22 DIAGNOSIS — G8929 Other chronic pain: Secondary | ICD-10-CM | POA: Diagnosis not present

## 2014-02-22 DIAGNOSIS — F419 Anxiety disorder, unspecified: Secondary | ICD-10-CM

## 2014-02-22 DIAGNOSIS — Z79899 Other long term (current) drug therapy: Secondary | ICD-10-CM | POA: Insufficient documentation

## 2014-02-22 DIAGNOSIS — J449 Chronic obstructive pulmonary disease, unspecified: Secondary | ICD-10-CM

## 2014-02-22 DIAGNOSIS — F411 Generalized anxiety disorder: Secondary | ICD-10-CM | POA: Diagnosis not present

## 2014-02-22 LAB — BASIC METABOLIC PANEL
Anion gap: 13 (ref 5–15)
BUN: 9 mg/dL (ref 6–23)
CHLORIDE: 101 meq/L (ref 96–112)
CO2: 25 meq/L (ref 19–32)
CREATININE: 0.75 mg/dL (ref 0.50–1.10)
Calcium: 9.7 mg/dL (ref 8.4–10.5)
GFR calc Af Amer: >90 mL/min (ref >90)
GFR calc non Af Amer: >90 mL/min (ref >90)
Glucose, Bld: 108 mg/dL — ABNORMAL HIGH (ref 70–99)
Potassium: 3.8 mEq/L (ref 3.7–5.3)
Sodium: 139 mEq/L (ref 137–147)

## 2014-02-22 LAB — CBC
HEMATOCRIT: 39.2 % (ref 36.0–46.0)
Hemoglobin: 13.2 g/dL (ref 12.0–15.0)
MCH: 32.7 pg (ref 26.0–34.0)
MCHC: 33.7 g/dL (ref 30.0–36.0)
MCV: 97 fL (ref 78.0–100.0)
Platelets: 248 10*3/uL (ref 150–400)
RBC: 4.04 MIL/uL (ref 3.87–5.11)
RDW: 12.3 % (ref 11.5–15.5)
WBC: 5.2 10*3/uL (ref 4.0–10.5)

## 2014-02-22 LAB — TROPONIN I
Troponin I: <0.3 ng/mL (ref <0.30)
Troponin I: <0.3 ng/mL (ref <0.30)

## 2014-02-22 MED ORDER — MIRTAZAPINE 15 MG PO TBDP
ORAL_TABLET | ORAL | Status: AC
  Filled 2014-02-22: qty 1

## 2014-02-22 MED ORDER — ALBUTEROL SULFATE HFA 108 (90 BASE) MCG/ACT IN AERS: 2.0000 | INHALATION_SPRAY | Freq: Four times a day (QID) | RESPIRATORY_TRACT | Status: DC | PRN

## 2014-02-22 MED ORDER — DIAZEPAM 5 MG PO TABS
10.0000 mg | ORAL_TABLET | Freq: Four times a day (QID) | ORAL | Status: DC | PRN
  Administered 2014-02-22 – 2014-02-23 (×2): 10 mg via ORAL
  Filled 2014-02-22 (×2): qty 2

## 2014-02-22 MED ORDER — ALBUTEROL SULFATE 2 MG PO TABS
4.0000 mg | ORAL_TABLET | Freq: Three times a day (TID) | ORAL | Status: DC
  Administered 2014-02-22 – 2014-02-24 (×6): 4 mg via ORAL
  Filled 2014-02-22 (×9): qty 2

## 2014-02-22 MED ORDER — ONDANSETRON HCL 4 MG/2ML IJ SOLN: 4.0000 mg | Freq: Four times a day (QID) | INTRAMUSCULAR | Status: DC | PRN

## 2014-02-22 MED ORDER — ALBUTEROL SULFATE HFA 108 (90 BASE) MCG/ACT IN AERS
2.0000 | INHALATION_SPRAY | Freq: Four times a day (QID) | RESPIRATORY_TRACT | Status: DC | PRN
  Filled 2014-02-22: qty 6.7

## 2014-02-22 MED ORDER — ALBUTEROL SULFATE (2.5 MG/3ML) 0.083% IN NEBU
2.5000 mg | INHALATION_SOLUTION | Freq: Once | RESPIRATORY_TRACT | Status: AC
  Administered 2014-02-22: 2.5 mg via RESPIRATORY_TRACT
  Filled 2014-02-22: qty 3

## 2014-02-22 MED ORDER — OXYCODONE-ACETAMINOPHEN 5-325 MG PO TABS
1.0000 | ORAL_TABLET | Freq: Once | ORAL | Status: AC
  Administered 2014-02-22: 1 via ORAL
  Filled 2014-02-22: qty 1

## 2014-02-22 MED ORDER — CITALOPRAM HYDROBROMIDE 20 MG PO TABS
20.0000 mg | ORAL_TABLET | Freq: Every day | ORAL | Status: DC
  Administered 2014-02-23: 20 mg via ORAL
  Filled 2014-02-22 (×2): qty 1

## 2014-02-22 MED ORDER — IPRATROPIUM-ALBUTEROL 0.5-2.5 (3) MG/3ML IN SOLN
3.0000 mL | Freq: Once | RESPIRATORY_TRACT | Status: AC
  Administered 2014-02-22: 3 mL via RESPIRATORY_TRACT
  Filled 2014-02-22: qty 3

## 2014-02-22 MED ORDER — SODIUM CHLORIDE 0.9 % IJ SOLN
3.0000 mL | Freq: Two times a day (BID) | INTRAMUSCULAR | Status: DC
  Administered 2014-02-22 – 2014-02-24 (×4): 3 mL via INTRAVENOUS

## 2014-02-22 MED ORDER — MIRTAZAPINE 30 MG PO TABS
15.0000 mg | ORAL_TABLET | Freq: Every day | ORAL | Status: DC
  Administered 2014-02-23: 15 mg via ORAL
  Filled 2014-02-22 (×3): qty 1

## 2014-02-22 MED ORDER — LISINOPRIL 10 MG PO TABS
20.0000 mg | ORAL_TABLET | Freq: Every day | ORAL | Status: DC
  Administered 2014-02-23 – 2014-02-24 (×2): 20 mg via ORAL
  Filled 2014-02-22 (×2): qty 2

## 2014-02-22 MED ORDER — OXYCODONE-ACETAMINOPHEN 5-325 MG PO TABS
1.0000 | ORAL_TABLET | ORAL | Status: DC | PRN
  Administered 2014-02-22 – 2014-02-23 (×5): 1 via ORAL
  Filled 2014-02-22 (×5): qty 1

## 2014-02-22 MED ORDER — BUPROPION HCL ER (XL) 300 MG PO TB24
300.0000 mg | ORAL_TABLET | Freq: Every day | ORAL | Status: DC
  Administered 2014-02-23 – 2014-02-24 (×2): 300 mg via ORAL
  Filled 2014-02-22: qty 2
  Filled 2014-02-22 (×2): qty 1

## 2014-02-22 MED ORDER — OMEGA-3-ACID ETHYL ESTERS 1 G PO CAPS
2.0000 g | ORAL_CAPSULE | Freq: Every day | ORAL | Status: DC
  Administered 2014-02-23: 2 g via ORAL
  Filled 2014-02-22 (×3): qty 2

## 2014-02-22 MED ORDER — AMLODIPINE BESYLATE 5 MG PO TABS: 5.0000 mg | ORAL_TABLET | Freq: Every day | ORAL | Status: DC

## 2014-02-22 MED ORDER — AMLODIPINE BESYLATE 5 MG PO TABS
5.0000 mg | ORAL_TABLET | Freq: Every day | ORAL | Status: DC
  Administered 2014-02-23: 5 mg via ORAL
  Filled 2014-02-22: qty 1

## 2014-02-22 MED ORDER — HEPARIN SODIUM (PORCINE) 5000 UNIT/ML IJ SOLN
5000.0000 [IU] | Freq: Three times a day (TID) | INTRAMUSCULAR | Status: DC
  Administered 2014-02-22 – 2014-02-24 (×6): 5000 [IU] via SUBCUTANEOUS
  Filled 2014-02-22 (×6): qty 1

## 2014-02-22 MED ORDER — BACITRACIN-NEOMYCIN-POLYMYXIN OINTMENT TUBE
TOPICAL_OINTMENT | CUTANEOUS | Status: DC | PRN
  Administered 2014-02-23: via TOPICAL
  Filled 2014-02-22: qty 15
  Filled 2014-02-22: qty 1

## 2014-02-22 MED ORDER — ASPIRIN 325 MG PO TABS
325.0000 mg | ORAL_TABLET | Freq: Once | ORAL | Status: AC
  Administered 2014-02-22: 325 mg via ORAL
  Filled 2014-02-22: qty 1

## 2014-02-22 MED ORDER — PANTOPRAZOLE SODIUM 40 MG PO TBEC
40.0000 mg | DELAYED_RELEASE_TABLET | Freq: Two times a day (BID) | ORAL | Status: DC
  Administered 2014-02-22 – 2014-02-24 (×5): 40 mg via ORAL
  Filled 2014-02-22 (×5): qty 1

## 2014-02-22 MED ORDER — NITROGLYCERIN 0.4 MG SL SUBL
0.4000 mg | SUBLINGUAL_TABLET | Freq: Once | SUBLINGUAL | Status: AC
  Administered 2014-02-22: 0.4 mg via SUBLINGUAL
  Filled 2014-02-22: qty 1

## 2014-02-22 MED ORDER — ALBUTEROL SULFATE 2 MG PO TABS
ORAL_TABLET | ORAL | Status: AC
  Filled 2014-02-22: qty 2

## 2014-02-22 MED ORDER — LISINOPRIL 10 MG PO TABS: 20.0000 mg | ORAL_TABLET | Freq: Every day | ORAL | Status: DC

## 2014-02-22 MED ORDER — ONDANSETRON HCL 4 MG PO TABS
4.0000 mg | ORAL_TABLET | Freq: Four times a day (QID) | ORAL | Status: DC | PRN
  Administered 2014-02-22 – 2014-02-23 (×2): 4 mg via ORAL
  Filled 2014-02-22 (×2): qty 1

## 2014-02-22 MED ORDER — GI COCKTAIL ~~LOC~~
30.0000 mL | Freq: Once | ORAL | Status: AC
  Administered 2014-02-22: 30 mL via ORAL
  Filled 2014-02-22: qty 30

## 2014-02-22 MED ORDER — ALBUTEROL SULFATE (2.5 MG/3ML) 0.083% IN NEBU: 2.5000 mg | INHALATION_SOLUTION | Freq: Four times a day (QID) | RESPIRATORY_TRACT | Status: DC | PRN

## 2014-02-22 MED ORDER — LABETALOL HCL 200 MG PO TABS
200.0000 mg | ORAL_TABLET | Freq: Two times a day (BID) | ORAL | Status: DC
  Administered 2014-02-22 – 2014-02-24 (×4): 200 mg via ORAL
  Filled 2014-02-22 (×4): qty 1

## 2014-02-22 NOTE — Consult Note (Signed)
Primary cardiologist: Consulting cardiologist:  Clinical Summary [name] is a 48 y.o.female hx of COPD, HTN, GERD, fibromyalgia admitted with chest pain. Also history of esophageal web, with recent symptoms of coughing and burning in her throat and dysphagia. For these symptoms had EGD recently by Dr Laural Golden 12/2013, normal mucosa, had dilatation done at that time.  Seen in ER 02/18/15 with chest pain. CT PE negative at that time. No evidence of ACS at that time. Discharged home, represents today with chest pain.    She describes episodes of chest pain starting around May. Describes a sharp/pressure like pain in midchest that is severe. Can occur at rest or with exertion. No other associated symptoms. Sometimes worst with position. Pain can last a few minutes or several hours and sometimes all day long. Since staring in May notes increasing frequency and severity, to point that occurs daily now. She also reports some increasing DOE around this same time period. She states this pain is similar to pain she had in 2013 when she had the negative exercise nuclear stress test. The symptoms had subsided, and now have returned.   ER vitals: p 71 bp 169/105 RR 12 100% RA Trop neg x1, Cr 0.75, K 3.8, Hgb 13.2, Plt 248 CXR no acute process EKG NSR CT PE 02/17/14 no PE or dissection.   No Known Allergies  Medications Scheduled Medications:    Infusions:    PRN Medications:        Past Medical History  Diagnosis Date  . Asthma   . COPD (chronic obstructive pulmonary disease)   . Hypertension   . GERD (gastroesophageal reflux disease)   . Anxiety disorder   . Chronic pain syndrome   . Anxiety   . Depression   . Fibromyalgia   . Epigastric pain 03/29/2013  . Fatigue 03/29/2013    Past Surgical History  Procedure Laterality Date  . Cholecystectomy    . Cesarean section    . Carpal tunnel release    . Esophagogastroduodenoscopy  07/31/2011    Procedure:  ESOPHAGOGASTRODUODENOSCOPY (EGD);  Surgeon: Rogene Houston, MD;  [facility];  Service: Endoscopy;  Laterality: N/A;  Venia Minks dilation  07/31/2011    Procedure: Venia Minks DILATION;  Surgeon: Rogene Houston, MD;  [facility];  Service: Endoscopy;;  . Endometrial ablation    . Mass excision  05/20/2012    Procedure: EXCISION MASS;  Surgeon: Sanjuana Kava, MD;  Location: [facility];  Service: Orthopedics;  Laterality: Left;  Repair of Fascial Defect Left Knee  . Carpal tunnel release  07/23/2012    Procedure: CARPAL TUNNEL RELEASE;  Surgeon: Sanjuana Kava, MD;  Location: [facility];  Service: Orthopedics;  Laterality: Left;  . Esophagogastroduodenoscopy (egd) with esophageal dilation N/A 09/30/2012    Procedure: ESOPHAGOGASTRODUODENOSCOPY (EGD) WITH ESOPHAGEAL DILATION;  Surgeon: Rogene Houston, MD;  [facility];  Service: Endoscopy;  Laterality: N/A;  245  . Knee surgery Left   . Esophagogastroduodenoscopy N/A 12/31/2013    Procedure: ESOPHAGOGASTRODUODENOSCOPY (EGD);  Surgeon: Rogene Houston, MD;  [facility];  Service: Endoscopy;  Laterality: N/A;  120-moved to 925 Ann to notify pt  . Balloon dilation N/A 12/31/2013    Procedure: BALLOON DILATION;  Surgeon: Rogene Houston, MD;  [facility];  Service: Endoscopy;  Laterality: N/A;  Venia Minks dilation N/A 12/31/2013    Procedure: Venia Minks DILATION;  Surgeon: Rogene Houston, MD;  [facility];  Service: Endoscopy;  Laterality: N/A;  . Savory dilation N/A 12/31/2013    Procedure: SAVORY DILATION;  Surgeon: Rogene Houston, MD;  [facility];  Service: Endoscopy;  Laterality: N/A;    Family History  Problem Relation Age of Onset  . Asthma Mother   . Asthma Father   . Cancer Father     Social History [name] reports that she quit smoking about 2 years ago. Her smoking use included Cigarettes. She smoked 0.00 packs per day. She has never used smokeless tobacco. [name] reports that she drinks about 1.2 ounces of alcohol per week.  Review of Systems CONSTITUTIONAL: No weight loss, fever, chills, weakness or fatigue.  HEENT: Eyes: No visual loss, blurred vision, double vision or yellow sclerae. No hearing loss, sneezing, congestion, runny nose or sore throat.  SKIN: No rash or itching.  CARDIOVASCULAR: per HPI RESPIRATORY: No shortness of breath, cough or sputum.  GASTROINTESTINAL: No anorexia, nausea, vomiting or diarrhea. No abdominal pain or blood.  GENITOURINARY: no polyuria, no dysuria NEUROLOGICAL: No headache, dizziness, syncope, paralysis, ataxia, numbness or tingling in the extremities. No change in bowel or bladder control.  MUSCULOSKELETAL: No muscle, back pain, joint pain or stiffness.  HEMATOLOGIC: No anemia, bleeding or bruising.  LYMPHATICS: No enlarged nodes. No history of splenectomy.  PSYCHIATRIC: No history of depression or anxiety.      Physical Examination Blood pressure 131/73, pulse 64, temperature 98 F (36.7 C), temperature source Oral, resp. rate 18, height 5\' 4"  (1.626 m), weight 155 lb (70.308 kg), SpO2 98.00%. No intake or output data in the 24 hours ending 02/22/14 1638  HEENT: sclere clear  Cardiovascular: RRR, no m/r/g, no JVD, no carotid bruits  Respiratory: bilateral expiratory wheezing  GI: abdomen soft, NT, ND  MSK: no LE edema  Neuro: no focal deficits  Psych: appropriate affect   Lab Results  Basic Metabolic Panel:  Recent Labs Lab 02/17/14 1550 02/22/14 1056  NA 139 139  K 3.6* 3.8  CL 100 101  CO2 25 25  GLUCOSE 120* 108*  BUN 12 9  CREATININE 0.62 0.75  CALCIUM 9.8 9.7    Liver Function Tests:  Recent Labs Lab 02/17/14 1550  AST 25  ALT 15  ALKPHOS 98  BILITOT 0.2*  PROT 7.5  ALBUMIN 3.9    CBC:  Recent Labs Lab 02/17/14 1550 02/22/14 1056  WBC 5.5 5.2  NEUTROABS 3.1  --   HGB 12.0 13.2  HCT 35.7* 39.2  MCV 97.5 97.0  PLT 235 248    Cardiac  Enzymes:  Recent Labs Lab 02/17/14 1550 02/22/14 1056  TROPONINI <0.30 <0.30    BNP: No components found with this basename: POCBNP,      Impression/Recommendations 1. Chest pain - fairly atypical chest pain in characteristics and duration, reports pain can last for several hours at a time. She has a complex GI history, unclear what role that may be playing.  - no evidence of ACS at this time, negative enzymes and stone cold normal EKG - CT PE negative from 02/17/14 - recommend observation overnight with pain control per medicine team. Cycle enzymes and EKGs, keep NPO tonight as we may consider stress testing in AM  2. SOB - bilateral wheezing on exam, suspect bronchospasm. No evidence of volume overload - management per primary team    Carlyle Dolly, M.D., F.A.C.C.

## 2014-02-22 NOTE — ED Notes (Signed)
EDP made aware that GI Cocktail did not relieve pt's pain

## 2014-02-22 NOTE — Patient Instructions (Signed)
Follow up with PCP or if you continue to have chest pain, go to the ED.

## 2014-02-22 NOTE — ED Provider Notes (Signed)
CSN: [id number]     Arrival date & time 02/22/14  1006 History  This chart was scribed for Nat Christen, MD by Ludger Nutting, ED Scribe. This patient was seen in room [facility] and the patient's care was started 1:12 PM.     Chief Complaint  Patient presents with  . Chest Pain    The history is provided by the patient. No language interpreter was used.    HPI Comments: [name] is a 48 y.o. female with past medical history of COPD, HTN, GERD, asthma who presents to the Emergency Department complaining of about 3 months of gradually worsened substernal chest pain that has become constant for the past 1 week. She states the pain is worse with deep breathing, swallowing, and eating. Patient states she has associated SOB and wheezing for which she has used breathing treatments at home. She has a history of 4 esophageal dilation procedures.   Last procedure one month ago by Dr. Laural Golden. Patient states her blood pressure has also been elevated recently with an average reading of 162/106. Patient is a former smoker.   PCP [place]  Past Medical History  Diagnosis Date  . Asthma   . COPD (chronic obstructive pulmonary disease)   . Hypertension   . GERD (gastroesophageal reflux disease)   . Anxiety disorder   . Chronic pain syndrome   . Anxiety   . Depression   . Fibromyalgia   . Epigastric pain 03/29/2013  . Fatigue 03/29/2013   Past Surgical History  Procedure Laterality Date  . Cholecystectomy    . Cesarean section    . Carpal tunnel release    . Esophagogastroduodenoscopy  07/31/2011    Procedure: ESOPHAGOGASTRODUODENOSCOPY (EGD);  Surgeon: Rogene Houston, MD;  [facility];  Service: Endoscopy;  Laterality: N/A;  Venia Minks dilation  07/31/2011    Procedure: Venia Minks DILATION;  Surgeon: Rogene Houston, MD;  [facility];  Service: Endoscopy;;  . Endometrial ablation    . Mass excision  05/20/2012    Procedure: EXCISION MASS;  Surgeon: Sanjuana Kava, MD;   [facility];  Service: Orthopedics;  Laterality: Left;  Repair of Fascial Defect Left Knee  . Carpal tunnel release  07/23/2012    Procedure: CARPAL TUNNEL RELEASE;  Surgeon: Sanjuana Kava, MD;  [facility];  Service: Orthopedics;  Laterality: Left;  . Esophagogastroduodenoscopy (egd) with esophageal dilation N/A 09/30/2012    Procedure: ESOPHAGOGASTRODUODENOSCOPY (EGD) WITH ESOPHAGEAL DILATION;  Surgeon: Rogene Houston, MD;  [facility];  Service: Endoscopy;  Laterality: N/A;  245  . Knee surgery Left   . Esophagogastroduodenoscopy N/A 12/31/2013    Procedure: ESOPHAGOGASTRODUODENOSCOPY (EGD);  Surgeon: Rogene Houston, MD;  [facility];  Service: Endoscopy;  Laterality: N/A;  120-moved to 925 Ann to notify pt  . Balloon dilation N/A 12/31/2013    Procedure: BALLOON DILATION;  Surgeon: Rogene Houston, MD;  [facility];  Service: Endoscopy;  Laterality: N/A;  Venia Minks dilation N/A 12/31/2013    Procedure: Venia Minks DILATION;  Surgeon: Rogene Houston, MD;  [facility];  Service: Endoscopy;  Laterality: N/A;  . Savory dilation N/A 12/31/2013    Procedure: SAVORY DILATION;  Surgeon: Rogene Houston, MD;  [facility];  Service: Endoscopy;  Laterality: N/A;   Family History  Problem Relation Age of Onset  . Asthma Mother   . Asthma Father   . Cancer Father    History  Substance Use Topics  . Smoking status: Former Smoker    Types: Cigarettes    Quit date: 10/26/2011  . Smokeless tobacco: Never Used  . Alcohol Use: 1.2 oz/week    2 Cans of beer per week     Comment: social   OB History   Grav Para Term Preterm Abortions TAB SAB Ect Mult Living   2 2        1      Review of Systems  A complete 10 system review of systems was obtained and all systems are negative except as noted in the HPI and PMH.    Allergies  Review of patient's allergies indicates no known allergies.  Home Medications   Prior to Admission  medications   Medication Sig Start Date End Date Taking? Authorizing Provider  albuterol (PROVENTIL HFA;VENTOLIN HFA) 108 (90 BASE) MCG/ACT inhaler Inhale 2 puffs into the lungs every 6 (six) hours as needed for wheezing or shortness of breath. For shortness of breath 07/31/11  Yes Kathie Dike, MD  albuterol (PROVENTIL) (2.5 MG/3ML) 0.083% nebulizer solution Take 2.5 mg by nebulization every 6 (six) hours as needed. For shortness of breath   Yes Historical Provider, MD  albuterol (PROVENTIL) 4 MG tablet Take 4 mg by mouth 3 (three) times daily.   Yes Historical Provider, MD  amLODipine (NORVASC) 5 MG tablet Take 5 mg by mouth daily.     Yes Historical Provider, MD  buPROPion (WELLBUTRIN XL) 300 MG 24 hr tablet Take 300 mg by mouth every morning.     Yes Historical Provider, MD  citalopram (CELEXA) 20 MG tablet Take 20 mg by mouth at bedtime.    Yes Historical Provider, MD  diazepam (VALIUM) 10 MG tablet Take 10 mg by mouth every 6 (six) hours as needed for anxiety.   Yes Historical Provider, MD  fish oil-omega-3 fatty acids 1000 MG capsule Take 2 g by mouth daily.    Yes Historical Provider, MD  labetalol (NORMODYNE) 100 MG tablet Take 200 mg by mouth 2 (two) times daily.   Yes Historical Provider, MD  lisinopril (PRINIVIL,ZESTRIL) 20 MG tablet Take 1 tablet (20 mg total) by mouth daily. 07/31/11  Yes Kathie Dike, MD  mirtazapine (REMERON) 15 MG tablet Take 15 mg by mouth at bedtime.   Yes Historical Provider, MD  pantoprazole (PROTONIX) 40 MG tablet Take 1 tablet (40 mg total) by mouth 2 (two) times daily before a meal. 12/31/13  Yes Rogene Houston, MD   BP 131/73  Pulse 64  Temp(Src) 98 F (36.7 C) (Oral)  Resp 18  Ht 5\' 4"  (1.626 m)  Wt 155 lb (70.308 kg)  BMI 26.59 kg/m2  SpO2 98% Physical Exam  Nursing note and vitals reviewed. Constitutional: She is oriented to person, place, and time. She appears well-developed and well-nourished.  HENT:  Head: Normocephalic and atraumatic.   Eyes: Conjunctivae and EOM are normal. Pupils are equal, round, and reactive to light.  Neck: Normal range of motion. Neck supple.  Cardiovascular: Normal rate, regular rhythm and normal heart sounds.   Pulmonary/Chest: Effort normal. She has wheezes (bilaterally).  Abdominal: Soft. Bowel sounds are normal.  Musculoskeletal: Normal range of motion.  Neurological: She is alert and oriented to person, place, and time.  Skin: Skin is warm and dry.  Psychiatric: She has a normal [name] and affect. Her behavior is normal.    ED Course  Procedures (including critical care time)  DIAGNOSTIC STUDIES: Oxygen Saturation is 100% on RA, normal  by my interpretation.    COORDINATION OF CARE: 1:19 PM Will order CXR, lab work, breathing treatment. Discussed treatment plan with pt at bedside and pt agreed to plan.   Labs Review Labs Reviewed  BASIC METABOLIC PANEL - Abnormal; Notable for the following:    Glucose, Bld 108 (*)    All other components within normal limits  CBC  TROPONIN I    Imaging Review Dg Chest Port 1 View  02/22/2014   CLINICAL DATA:  Chest pain.  EXAM: PORTABLE CHEST - 1 VIEW  COMPARISON:  Chest CT 02/17/2014.  FINDINGS: Cardiopericardial silhouette within normal limits. Mediastinal contours normal. Trachea midline. No airspace disease or effusion. Monitoring leads project over the chest.  IMPRESSION: No active disease.   Electronically Signed   By: Dereck Ligas M.D.   On: 02/22/2014 10:49     EKG Interpretation None      Date: 02/22/2014  Rate: 72  Rhythm: normal sinus rhythm  QRS Axis: normal  Intervals: normal  ST/T Wave abnormalities: normal  Conduction Disutrbances: none  Narrative Interpretation: unremarkable    MDM   Final diagnoses:  Chest pain, unspecified chest pain type    Chest pain for 4 days. Rest factors include  past smoker and hypertension. Also history of esophageal stricture and dilatation.  EKG and troponin negative. Discussed with  cardiology. Admit to general medicine.  I personally performed the services described in this documentation, which was scribed in my presence. The recorded information has been reviewed and is accurate.   Nat Christen, MD 02/22/14 [phone]

## 2014-02-22 NOTE — ED Notes (Signed)
Left sided cp x 3 days with sob.  Seen for same x 3 days ago.  Sent here by GI today for continuous cp.  Denies n/v/d/dizziness.

## 2014-02-22 NOTE — Progress Notes (Signed)
Subjective:     Patient ID: [name], female   DOB: [birth year], 48 y.o.   MRN: [id number]  HPI  Presents today with c/o chest pain.  She also c/o shooting pain in her temple area. No headache today.   She says today she has substernal chest pain. She has had this chest pain off and on since Mother's day.  Rates the pain at an 8. She has being using Rolaids and this has not helped. Denies fever, chills, nausea or vomiting. She does have SOB which is chronic. Hx of asthma and COPD.    As long as she has something to drink with her meals she is fine. She denies having acid reflux.  She does tell me all foods are lodging. (She does not want a repeat EGD/ED at this time). Appetite has been good. No weight loss. BM's are normal.  Today she c/o chest pain.  She denies any acid reflux.  She says she has a constant pain in her chest.  She thought she was having a heart attack and was seen in the ED 02/17/2014. EKG and troponin were normal.  She says her blood pressure was over 200 while in the ED.       02/17/2014 CT angio chest:  IMPRESSION:  1. No acute abnormality. No evidence of dissection or pulmonary  embolus.  2. Aberrant right subclavian artery. This is a normal variant.    01/17/2014 DG Esophagus  Small hiatal hernia.  Slight smooth short-segment narrowing of the distal esophagus with  brief sticking of the barium tablet in this area which reproduces  the patient's symptoms.  Nonspecific esophageal motility disorder.   12/31/2013 EGD/ED: Dr. Laural Golden:   Indications: Patient is 48 year old Caucasian female with GERD and history of esophageal web who presents with recurrent coughing spells burning in her throat as well as dysphagia.   Impression:  Small sliding hiatal hernia without ring or stricture formation.  Esophageal dilation with 56 French Maloney dilator resulted in linear mucosal disruption at proximal esophagus below UES indicative of a web.      Review of Systems Past  Medical History  Diagnosis Date  . Asthma   . COPD (chronic obstructive pulmonary disease)   . Hypertension   . GERD (gastroesophageal reflux disease)   . Anxiety disorder   . Chronic pain syndrome   . Anxiety   . Depression   . Fibromyalgia   . Epigastric pain 03/29/2013  . Fatigue 03/29/2013    Past Surgical History  Procedure Laterality Date  . Cholecystectomy    . Cesarean section    . Carpal tunnel release    . Esophagogastroduodenoscopy  07/31/2011    Procedure: ESOPHAGOGASTRODUODENOSCOPY (EGD);  Surgeon: Rogene Houston, MD;  [facility]: [facility];  Service: Endoscopy;  Laterality: N/A;  Venia Minks dilation  07/31/2011    Procedure: Venia Minks DILATION;  Surgeon: Rogene Houston, MD;  Location: [facility];  Service: Endoscopy;;  . Endometrial ablation    . Mass excision  05/20/2012    Procedure: EXCISION MASS;  Surgeon: Sanjuana Kava, MD;  Location: [facility];  Service: Orthopedics;  Laterality: Left;  Repair of Fascial Defect Left Knee  . Carpal tunnel release  07/23/2012    Procedure: CARPAL TUNNEL RELEASE;  Surgeon: Sanjuana Kava, MD;  Location: [facility];  Service: Orthopedics;  Laterality: Left;  . Esophagogastroduodenoscopy (egd) with esophageal dilation N/A 09/30/2012    Procedure: ESOPHAGOGASTRODUODENOSCOPY (EGD) WITH ESOPHAGEAL DILATION;  Surgeon: Rogene Houston, MD;  [facility];  Service: Endoscopy;  Laterality: N/A;  245  . Knee surgery Left   . Esophagogastroduodenoscopy N/A 12/31/2013    Procedure: ESOPHAGOGASTRODUODENOSCOPY (EGD);  Surgeon: Rogene Houston, MD;  [facility]: [facility];  Service: Endoscopy;  Laterality: N/A;  120-moved to 925 Ann to notify pt  . Balloon dilation N/A 12/31/2013    Procedure: BALLOON DILATION;  Surgeon: Rogene Houston, MD;  [facility]: [facility];  Service: Endoscopy;  Laterality: N/A;  Venia Minks dilation N/A 12/31/2013    Procedure: Venia Minks DILATION;  Surgeon: Rogene Houston, MD;  [facility];  Service: Endoscopy;   Laterality: N/A;  . Savory dilation N/A 12/31/2013    Procedure: SAVORY DILATION;  Surgeon: Rogene Houston, MD;  [facility];  Service: Endoscopy;  Laterality: N/A;    No Known Allergies  Current Outpatient Prescriptions on File Prior to Visit  Medication Sig Dispense Refill  . albuterol (PROVENTIL HFA;VENTOLIN HFA) 108 (90 BASE) MCG/ACT inhaler Inhale 2 puffs into the lungs every 6 (six) hours as needed for wheezing or shortness of breath. For shortness of breath  1 Inhaler  1  . albuterol (PROVENTIL) (2.5 MG/3ML) 0.083% nebulizer solution Take 2.5 mg by nebulization every 6 (six) hours as needed. For shortness of breath      . albuterol (PROVENTIL) 4 MG tablet Take 4 mg by mouth 3 (three) times daily.      Marland Kitchen amLODipine (NORVASC) 5 MG tablet Take 5 mg by mouth daily.        Marland Kitchen buPROPion (WELLBUTRIN XL) 300 MG 24 hr tablet Take 300 mg by mouth every morning.        . citalopram (CELEXA) 20 MG tablet Take 20 mg by mouth at bedtime.       . diazepam (VALIUM) 10 MG tablet Take 10 mg by mouth every 6 (six) hours as needed for anxiety.      . fish oil-omega-3 fatty acids 1000 MG capsule Take 2 g by mouth daily.       Marland Kitchen labetalol (NORMODYNE) 100 MG tablet Take 200 mg by mouth 2 (two) times daily.      Marland Kitchen lisinopril (PRINIVIL,ZESTRIL) 20 MG tablet Take 1 tablet (20 mg total) by mouth daily.  30 tablet  0  . mirtazapine (REMERON) 15 MG tablet Take 15 mg by mouth at bedtime.      Marland Kitchen oxyCODONE-acetaminophen (PERCOCET) 10-325 MG per tablet Take 1 tablet by mouth every 4 (four) hours as needed for pain.      . pantoprazole (PROTONIX) 40 MG tablet Take 1 tablet (40 mg total) by mouth 2 (two) times daily before a meal.  60 tablet  3  . sucralfate (CARAFATE) 1 GM/10ML suspension Take 10 mLs (1 g total) by mouth every 6 (six) hours.  420 mL  0   No current facility-administered medications on file prior to visit.        Objective:   Physical Exam  Filed Vitals:   02/22/14 0936  BP: 102/80   Pulse: 68  Temp: 98.1 F (36.7 C)  Height: 5\' 4"  (1.626 m)  Weight: 155 lb 3.2 oz (70.398 kg)  Alert and oriented. Skin warm and dry. Oral mucosa is moist.   . Sclera anicteric, conjunctivae is pink. Thyroid not enlarged. No cervical lymphadenopathy. Bilateral wheezes.  Heart regular rate and rhythm.  Abdomen is soft. Bowel sounds are positive. No hepatomegaly. No abdominal masses felt. No tenderness.  No edema to lower  extremities. Patient is alert and oriented.      Assessment:     Chest pain. Do not think this is GI related.  Cardiac needs to be ruled out.  She has a hx of dysphagia. She denies acid reflux. She does not want an EGD/ED.     Plan:     I advised her if this pain continues to go to the ED or follow up with her PCP for better control of her B/p

## 2014-02-22 NOTE — H&P (Signed)
[facility] History and Physical  [name] VPX:[id number] DOB: 14-Jan-1966 DOA: 02/22/2014  Referring physician: ER. PCP: Glo Herring., MD   Chief Complaint: Chest pain.  HPI: [name] is a 48 y.o. female  This is a 48 year old lady who has had chest pain for actually several months who now presents with worsening of the chest pain. She has a history of hypertension, GERD, fibromyalgia. She also has a history of dysphagia and has required esophageal dilation in the past. The last one she had was in June of 2015 where she was found to have normal mucosa and patient was done at that time. She was also seen in the emergency room 5 days ago with chest pain a CT angiogram was negative for pulmonary embolism. She says that the chest pain started around May 2015 and describes it as a sharp but sometimes pressure-like feeling in the mid chest. This can occur rest or with exertion. There are no really associated symptoms. It does seem to have dyspnea associated on occasions. She has had a negative stress test in the past. She is now being admitted for further investigation.   Review of Systems:  Constitutional:  No weight loss, night sweats, Fevers, chills, fatigue.  HEENT:  No headaches, Difficulty swallowing,Tooth/dental problems,Sore throat,  No sneezing, itching, ear ache, nasal congestion, post nasal drip,    Resp:  No shortness of breath with exertion or at rest. No excess mucus, no productive cough, No non-productive cough, No coughing up of blood.No change in color of mucus.No wheezing.No chest wall deformity  Skin:  no rash or lesions.  GU:  no dysuria, change in color of urine, no urgency or frequency. No flank pain.  Musculoskeletal:  No joint pain or swelling. No decreased range of motion. No back pain.  Psych:  No change in mood or affect. No depression or anxiety. No memory loss.   Past Medical History  Diagnosis Date  . Asthma   . COPD (chronic  obstructive pulmonary disease)   . Hypertension   . GERD (gastroesophageal reflux disease)   . Anxiety disorder   . Chronic pain syndrome   . Anxiety   . Depression   . Fibromyalgia   . Epigastric pain 03/29/2013  . Fatigue 03/29/2013   Past Surgical History  Procedure Laterality Date  . Cholecystectomy    . Cesarean section    . Carpal tunnel release    . Esophagogastroduodenoscopy  07/31/2011    Procedure: ESOPHAGOGASTRODUODENOSCOPY (EGD);  Surgeon: Rogene Houston, MD;  [facility];  Service: Endoscopy;  Laterality: N/A;  Venia Minks dilation  07/31/2011    Procedure: Venia Minks DILATION;  Surgeon: Rogene Houston, MD;  [facility];  Service: Endoscopy;;  . Endometrial ablation    . Mass excision  05/20/2012    Procedure: EXCISION MASS;  Surgeon: Sanjuana Kava, MD;  Location: [facility];  Service: Orthopedics;  Laterality: Left;  Repair of Fascial Defect Left Knee  . Carpal tunnel release  07/23/2012    Procedure: CARPAL TUNNEL RELEASE;  Surgeon: Sanjuana Kava, MD;  Location: [facility];  Service: Orthopedics;  Laterality: Left;  . Esophagogastroduodenoscopy (egd) with esophageal dilation N/A 09/30/2012    Procedure: ESOPHAGOGASTRODUODENOSCOPY (EGD) WITH ESOPHAGEAL DILATION;  Surgeon: Rogene Houston, MD;  [facility];  Service: Endoscopy;  Laterality: N/A;  245  . Knee surgery Left   . Esophagogastroduodenoscopy N/A 12/31/2013    Procedure: ESOPHAGOGASTRODUODENOSCOPY (EGD);  Surgeon: Rogene Houston, MD;  [facility];  Service: Endoscopy;  Laterality: N/A;  120-moved to 925 Ann to notify pt  . Balloon dilation N/A 12/31/2013    Procedure: BALLOON DILATION;  Surgeon: Rogene Houston, MD;  [facility];  Service: Endoscopy;  Laterality: N/A;  Venia Minks dilation N/A 12/31/2013    Procedure: Venia Minks DILATION;  Surgeon: Rogene Houston, MD;  [facility];  Service: Endoscopy;  Laterality: N/A;  . Savory dilation N/A 12/31/2013     Procedure: SAVORY DILATION;  Surgeon: Rogene Houston, MD;  [facility];  Service: Endoscopy;  Laterality: N/A;   Social History:  reports that she quit smoking about 2 years ago. Her smoking use included Cigarettes. She smoked 0.00 packs per day. She has never used smokeless tobacco. She reports that she drinks about 1.2 ounces of alcohol per week. She reports that she does not use illicit drugs.  No Known Allergies  Family History  Problem Relation Age of Onset  . Asthma Mother   . Asthma Father   . Cancer Father      Prior to Admission medications   Medication Sig Start Date End Date Taking? Authorizing Provider  albuterol (PROVENTIL HFA;VENTOLIN HFA) 108 (90 BASE) MCG/ACT inhaler Inhale 2 puffs into the lungs every 6 (six) hours as needed for wheezing or shortness of breath. For shortness of breath 07/31/11  Yes Kathie Dike, MD  albuterol (PROVENTIL) (2.5 MG/3ML) 0.083% nebulizer solution Take 2.5 mg by nebulization every 6 (six) hours as needed. For shortness of breath   Yes Historical Provider, MD  albuterol (PROVENTIL) 4 MG tablet Take 4 mg by mouth 3 (three) times daily.   Yes Historical Provider, MD  amLODipine (NORVASC) 5 MG tablet Take 5 mg by mouth daily.     Yes Historical Provider, MD  buPROPion (WELLBUTRIN XL) 300 MG 24 hr tablet Take 300 mg by mouth every morning.     Yes Historical Provider, MD  citalopram (CELEXA) 20 MG tablet Take 20 mg by mouth at bedtime.    Yes Historical Provider, MD  diazepam (VALIUM) 10 MG tablet Take 10 mg by mouth every 6 (six) hours as needed for anxiety.   Yes Historical Provider, MD  fish oil-omega-3 fatty acids 1000 MG capsule Take 2 g by mouth daily.    Yes Historical Provider, MD  labetalol (NORMODYNE) 100 MG tablet Take 200 mg by mouth 2 (two) times daily.   Yes Historical Provider, MD  lisinopril (PRINIVIL,ZESTRIL) 20 MG tablet Take 1 tablet (20 mg total) by mouth daily. 07/31/11  Yes Kathie Dike, MD  mirtazapine (REMERON)  15 MG tablet Take 15 mg by mouth at bedtime.   Yes Historical Provider, MD  pantoprazole (PROTONIX) 40 MG tablet Take 1 tablet (40 mg total) by mouth 2 (two) times daily before a meal. 12/31/13  Yes Rogene Houston, MD   Physical Exam: Filed Vitals:   02/22/14 1709 02/22/14 1715 02/22/14 1730 02/22/14 1745  BP: 142/103  139/94   Pulse: 65 65 69 65  Temp:      TempSrc:      Resp: 17 19 15 16   Height:      Weight:      SpO2: 100% 100% 100% 99%    Wt Readings from Last 3 Encounters:  02/22/14 70.308 kg (155 lb)  02/22/14 70.398 kg (155 lb 3.2 oz)  02/17/14 72.576 kg (160 lb)    General:  Appears anxious. Does not appear to be in pain  at the present time. Eyes: PERRL, normal lids, irises & conjunctiva ENT: grossly normal hearing, lips & tongue Neck: no LAD, masses or thyromegaly Cardiovascular: RRR, no m/r/g. No LE edema. Telemetry: SR, no arrhythmias  Respiratory: CTA bilaterally, no w/r/r. Normal respiratory effort. Abdomen: soft, ntnd Skin: no rash or induration seen on limited exam Musculoskeletal: grossly normal tone BUE/BLE Psychiatric: Anxious. Neurologic: grossly non-focal.          Labs on Admission:  Basic Metabolic Panel:  Recent Labs Lab 02/17/14 1550 02/22/14 1056  NA 139 139  K 3.6* 3.8  CL 100 101  CO2 25 25  GLUCOSE 120* 108*  BUN 12 9  CREATININE 0.62 0.75  CALCIUM 9.8 9.7   Liver Function Tests:  Recent Labs Lab 02/17/14 1550  AST 25  ALT 15  ALKPHOS 98  BILITOT 0.2*  PROT 7.5  ALBUMIN 3.9    Recent Labs Lab 02/17/14 1550  LIPASE 55   No results found for this basename: AMMONIA,  in the last 168 hours CBC:  Recent Labs Lab 02/17/14 1550 02/22/14 1056  WBC 5.5 5.2  NEUTROABS 3.1  --   HGB 12.0 13.2  HCT 35.7* 39.2  MCV 97.5 97.0  PLT 235 248   Cardiac Enzymes:  Recent Labs Lab 02/17/14 1550 02/22/14 1056  TROPONINI <0.30 <0.30    BNP (last 3 results) No results found for this basename: PROBNP,  in the last 8760  hours CBG: No results found for this basename: GLUCAP,  in the last 168 hours  Radiological Exams on Admission: Dg Chest Port 1 View  02/22/2014   CLINICAL DATA:  Chest pain.  EXAM: PORTABLE CHEST - 1 VIEW  COMPARISON:  Chest CT 02/17/2014.  FINDINGS: Cardiopericardial silhouette within normal limits. Mediastinal contours normal. Trachea midline. No airspace disease or effusion. Monitoring leads project over the chest.  IMPRESSION: No active disease.   Electronically Signed   By: Dereck Ligas M.D.   On: 02/22/2014 10:49    EKG: Independently reviewed. Normal sinus rhythm without any acute ST-T wave changes.  Assessment/Plan Active Problems:   Chest pain   Hypertension   GERD (gastroesophageal reflux disease)   Anxiety disorder   Chronic pain syndrome   Dysphagia, unspecified(787.20)   Chest pain at rest   1. Chest pain, atypical of unclear etiology. Cardiac pain is unlikely based on the history but she does have some risk factors. 2. Chronic pain syndrome. 3. Fibromyalgia. 4. History of dysphagia and history of esophageal.patient.  Plan: 1. Admit to telemetry. 2. Serial cardiac enzymes. 3. Cardiology consultation appreciated. 4. Consider stress test.  Further recommendations will depend on patient's hospital progress.   Code Status: Full code.  DVT Prophylaxis: Heparin.  Family Communication: I discussed the plan with the patient at the bedside.   Disposition Plan: Home when medically stable.   Time spent: 60 minutes.  Doree Albee [facility] Pager [phone].  **Disclaimer: This note may have been dictated with voice recognition software. Similar sounding words can inadvertently be transcribed and this note may contain transcription errors which may not have been corrected upon publication of note.**

## 2014-02-23 DIAGNOSIS — R1310 Dysphagia, unspecified: Secondary | ICD-10-CM

## 2014-02-23 DIAGNOSIS — J45901 Unspecified asthma with (acute) exacerbation: Secondary | ICD-10-CM | POA: Diagnosis not present

## 2014-02-23 DIAGNOSIS — K219 Gastro-esophageal reflux disease without esophagitis: Secondary | ICD-10-CM | POA: Diagnosis not present

## 2014-02-23 DIAGNOSIS — R072 Precordial pain: Secondary | ICD-10-CM

## 2014-02-23 DIAGNOSIS — J449 Chronic obstructive pulmonary disease, unspecified: Secondary | ICD-10-CM

## 2014-02-23 DIAGNOSIS — I10 Essential (primary) hypertension: Secondary | ICD-10-CM | POA: Diagnosis not present

## 2014-02-23 DIAGNOSIS — R079 Chest pain, unspecified: Secondary | ICD-10-CM | POA: Diagnosis not present

## 2014-02-23 LAB — COMPREHENSIVE METABOLIC PANEL
ALBUMIN: 3.9 g/dL (ref 3.5–5.2)
ALT: 19 U/L (ref 0–35)
AST: 21 U/L (ref 0–37)
Alkaline Phosphatase: 80 U/L (ref 39–117)
Anion gap: 12 (ref 5–15)
BUN: 11 mg/dL (ref 6–23)
CHLORIDE: 102 meq/L (ref 96–112)
CO2: 27 mEq/L (ref 19–32)
Calcium: 9.3 mg/dL (ref 8.4–10.5)
Creatinine, Ser: 0.74 mg/dL (ref 0.50–1.10)
GFR calc Af Amer: >90 mL/min (ref >90)
GFR calc non Af Amer: >90 mL/min (ref >90)
Glucose, Bld: 86 mg/dL (ref 70–99)
POTASSIUM: 3.8 meq/L (ref 3.7–5.3)
SODIUM: 141 meq/L (ref 137–147)
Total Bilirubin: 0.3 mg/dL (ref 0.3–1.2)
Total Protein: 7.4 g/dL (ref 6.0–8.3)

## 2014-02-23 LAB — CBC
HEMATOCRIT: 38.7 % (ref 36.0–46.0)
Hemoglobin: 12.9 g/dL (ref 12.0–15.0)
MCH: 32.8 pg (ref 26.0–34.0)
MCHC: 33.3 g/dL (ref 30.0–36.0)
MCV: 98.5 fL (ref 78.0–100.0)
PLATELETS: 241 10*3/uL (ref 150–400)
RBC: 3.93 MIL/uL (ref 3.87–5.11)
RDW: 12.4 % (ref 11.5–15.5)
WBC: 6.3 10*3/uL (ref 4.0–10.5)

## 2014-02-23 LAB — TSH: TSH: 5.06 u[IU]/mL — ABNORMAL HIGH (ref 0.350–4.500)

## 2014-02-23 LAB — TROPONIN I

## 2014-02-23 MED ORDER — IPRATROPIUM-ALBUTEROL 0.5-2.5 (3) MG/3ML IN SOLN
3.0000 mL | Freq: Four times a day (QID) | RESPIRATORY_TRACT | Status: DC
  Administered 2014-02-23: 3 mL via RESPIRATORY_TRACT
  Filled 2014-02-23: qty 3

## 2014-02-23 MED ORDER — OXYCODONE-ACETAMINOPHEN 5-325 MG PO TABS
1.0000 | ORAL_TABLET | Freq: Four times a day (QID) | ORAL | Status: DC | PRN
  Administered 2014-02-23 – 2014-02-24 (×3): 1 via ORAL
  Filled 2014-02-23 (×3): qty 1

## 2014-02-23 MED ORDER — IPRATROPIUM-ALBUTEROL 0.5-2.5 (3) MG/3ML IN SOLN
3.0000 mL | Freq: Three times a day (TID) | RESPIRATORY_TRACT | Status: DC
  Administered 2014-02-24 (×2): 3 mL via RESPIRATORY_TRACT
  Filled 2014-02-23 (×2): qty 3

## 2014-02-23 MED ORDER — AMLODIPINE BESYLATE 5 MG PO TABS
10.0000 mg | ORAL_TABLET | Freq: Every day | ORAL | Status: DC
  Administered 2014-02-24: 10 mg via ORAL
  Filled 2014-02-23: qty 2

## 2014-02-23 MED ORDER — OMEGA-3-ACID ETHYL ESTERS 1 G PO CAPS
2.0000 g | ORAL_CAPSULE | Freq: Every day | ORAL | Status: DC
  Administered 2014-02-24: 2 g via ORAL
  Filled 2014-02-23: qty 2

## 2014-02-23 MED ORDER — OXYCODONE HCL 5 MG PO TABS
5.0000 mg | ORAL_TABLET | Freq: Four times a day (QID) | ORAL | Status: DC | PRN
  Administered 2014-02-23 – 2014-02-24 (×3): 5 mg via ORAL
  Filled 2014-02-23 (×3): qty 1

## 2014-02-23 MED ORDER — NITROGLYCERIN 2 % TD OINT
1.0000 [in_us] | TOPICAL_OINTMENT | Freq: Four times a day (QID) | TRANSDERMAL | Status: DC
  Administered 2014-02-23 – 2014-02-24 (×4): 1 [in_us] via TOPICAL
  Filled 2014-02-23 (×4): qty 1

## 2014-02-23 MED ORDER — ASPIRIN EC 81 MG PO TBEC
81.0000 mg | DELAYED_RELEASE_TABLET | Freq: Every day | ORAL | Status: DC
  Administered 2014-02-23 – 2014-02-24 (×2): 81 mg via ORAL
  Filled 2014-02-23 (×2): qty 1

## 2014-02-23 NOTE — Progress Notes (Signed)
[facility] PROGRESS NOTE  [name] RXV:[id number] DOB: 04/26/1966 DOA: 02/22/2014 PCP: Glo Herring., MD  Assessment/Plan: 1. Chest pain.  Appears atypical.  Appreciate cardiology assistance. She is ruled out for ACS with negative cardiac markers. Echocardiogram appears to be unremarkable. She's recently had esophageal dilatation by GI. She continues to have difficulty with swallowing and chest pain. We'll start the patient on nitroglycerin paste to see if this helps with any esophageal spasm. 2. COPD. Patient has only mild wheezing on exam at this time. Continue bronchodilators. If wheezing continues to get worse/does not improve, then consider steroids. 3. Chronic pain syndrome. Patient has chronic back pain and is on Percocet. Continue the same.  Code Status: Full code Family Communication: discharge home once improved Disposition Plan: discharge home once improved   Consultants:  Cardiology  Procedures: Echo:- Left ventricle: The cavity size was normal. Wall thickness was normal. Systolic function was normal. The estimated ejection fraction was in the range of 55% to 60%. Doppler parameters are consistent with abnormal left ventricular relaxation (grade 1 diastolic dysfunction).    Antibiotics:    HPI/Subjective: Has continued substernal chest pain. Feels that it may be worse with food, but says it is present regardless. Feels that wheezing is improving  Objective: Filed Vitals:   02/23/14 1500  BP: 155/85  Pulse: 70  Temp: 98 F (36.7 C)  Resp: 20    Intake/Output Summary (Last 24 hours) at 02/23/14 1852 Last data filed at 02/23/14 1749  Gross per 24 hour  Intake    720 ml  Output   1100 ml  Net   -380 ml   Filed Weights   02/22/14 1022 02/22/14 1842  Weight: 70.308 kg (155 lb) 70.6 kg (155 lb 10.3 oz)    Exam:   General:  NAD  Cardiovascular: s1, s2, rrr  Respiratory: diminished breath sounds with mild wheeze  bilaterally  Abdomen: soft, nt, nd, bs+  Musculoskeletal: no edema b/l   Data Reviewed: Basic Metabolic Panel:  Recent Labs Lab 02/17/14 1550 02/22/14 1056 02/23/14 0534  NA 139 139 141  K 3.6* 3.8 3.8  CL 100 101 102  CO2 25 25 27   GLUCOSE 120* 108* 86  BUN 12 9 11   CREATININE 0.62 0.75 0.74  CALCIUM 9.8 9.7 9.3   Liver Function Tests:  Recent Labs Lab 02/17/14 1550 02/23/14 0534  AST 25 21  ALT 15 19  ALKPHOS 98 80  BILITOT 0.2* 0.3  PROT 7.5 7.4  ALBUMIN 3.9 3.9    Recent Labs Lab 02/17/14 1550  LIPASE 55   No results found for this basename: AMMONIA,  in the last 168 hours CBC:  Recent Labs Lab 02/17/14 1550 02/22/14 1056 02/23/14 0534  WBC 5.5 5.2 6.3  NEUTROABS 3.1  --   --   HGB 12.0 13.2 12.9  HCT 35.7* 39.2 38.7  MCV 97.5 97.0 98.5  PLT 235 248 241   Cardiac Enzymes:  Recent Labs Lab 02/17/14 1550 02/22/14 1056 02/22/14 1806 02/22/14 2321 02/23/14 0534  TROPONINI <0.30 <0.30 <0.30 <0.30 <0.30   BNP (last 3 results) No results found for this basename: PROBNP,  in the last 8760 hours CBG: No results found for this basename: GLUCAP,  in the last 168 hours  No results found for this or any previous visit (from the past 240 hour(s)).   Studies: Dg Chest Port 1 View  02/22/2014   CLINICAL DATA:  Chest pain.  EXAM: PORTABLE CHEST - 1 VIEW  COMPARISON:  Chest CT 02/17/2014.  FINDINGS: Cardiopericardial silhouette within normal limits. Mediastinal contours normal. Trachea midline. No airspace disease or effusion. Monitoring leads project over the chest.  IMPRESSION: No active disease.   Electronically Signed   By: Dereck Ligas M.D.   On: 02/22/2014 10:49    Scheduled Meds: . albuterol  4 mg Oral TID  . [START ON 02/24/2014] amLODipine  10 mg Oral Daily  . aspirin EC  81 mg Oral Daily  . buPROPion  300 mg Oral Daily  . citalopram  20 mg Oral QHS  . heparin  5,000 Units Subcutaneous 3 times per day  . ipratropium-albuterol  3 mL  Nebulization Q6H  . labetalol  200 mg Oral BID  . lisinopril  20 mg Oral Daily  . mirtazapine  15 mg Oral QHS  . nitroGLYCERIN  1 inch Topical 4 times per day  . [START ON 02/24/2014] omega-3 acid ethyl esters  2 g Oral Daily  . pantoprazole  40 mg Oral BID AC  . sodium chloride  3 mL Intravenous Q12H   Continuous Infusions:   Active Problems:   Hypertension   GERD (gastroesophageal reflux disease)   Anxiety disorder   Chronic pain syndrome   Dysphagia, unspecified(787.20)   Chest pain   Chest pain at rest    Time spent: 71mins    [name]  [facility] Pager [phone]. If 7PM-7AM, please contact night-coverage at [email or web address], password [email or web address] 02/23/2014, 6:52 PM  LOS: 1 day

## 2014-02-23 NOTE — Progress Notes (Signed)
  Echocardiogram 2D Echocardiogram has been performed.  [name], [name] 02/23/2014, 11:43 AM

## 2014-02-23 NOTE — Progress Notes (Signed)
Subjective:  Continues to have pressure/knot in her chest-constant. Mild relief from Valium last night. Also wheezing, and recent esophageal stretch. Trouble swallowing.  Objective:  Vital Signs in the last 24 hours: Temp:  [97.8 F (36.6 C)-98.1 F (36.7 C)] 97.8 F (36.6 C) (08/12 0500) Pulse Rate:  [55-79] 68 (08/12 0500) Resp:  [9-20] 20 (08/12 0500) BP: (102-172)/(69-105) 172/90 mmHg (08/12 0500) SpO2:  [98 %-100 %] 100 % (08/12 0500) Weight:  [155 lb (70.308 kg)-155 lb 10.3 oz (70.6 kg)] 155 lb 10.3 oz (70.6 kg) (08/11 1842)  Intake/Output from previous day: 08/11 0701 - 08/12 0700 In: 240 [P.O.:240] Out: -  Intake/Output from this shift:    Physical Exam: NECK: Without JVD, HJR, or bruit LUNGS: Decreased breath sounds throughout with diffuse inspiratory/expiratory wheezing. HEART: Regular rate and rhythm, no murmur, gallop, rub, bruit, thrill, or heave EXTREMITIES: Without cyanosis, clubbing, or edema   Lab Results:  Recent Labs  02/22/14 1056 02/23/14 0534  WBC 5.2 6.3  HGB 13.2 12.9  PLT 248 241    Recent Labs  02/22/14 1056 02/23/14 0534  NA 139 141  K 3.8 3.8  CL 101 102  CO2 25 27  GLUCOSE 108* 86  BUN 9 11  CREATININE 0.75 0.74    Recent Labs  02/22/14 2321 02/23/14 0534  TROPONINI <0.30 <0.30   Hepatic Function Panel  Recent Labs  02/23/14 0534  PROT 7.4  ALBUMIN 3.9  AST 21  ALT 19  ALKPHOS 80  BILITOT 0.3   No results found for this basename: CHOL,  in the last 72 hours No results found for this basename: PROTIME,  in the last 72 hours  Imaging:   Cardiac Studies:  Assessment/Plan:  1. Chest pain - fairly atypical chest pain in characteristics and duration, reports pain can last for several hours at a time. She has a complex GI history, unclear what role that may be playing.   - no evidence of ACS at this time, negative enzymes and stone cold normal EKG - CT PE negative from 02/17/14  Cycle enzymes negative and EKGs,  Can't perform stress testing with her COPD exacerbation. Can do GXT as outpatient. Repeat EKG today.  2. COPD/SOB - bilateral wheezing on exam, suspect bronchospasm. No evidence of volume overload - management per primary team   3. HTN: BP elevated on Norvasc 5mg , lisinopril 20mg , labetolol 200 mg BID  Dysphagia: Esophageal dilatation in June. ? GI to see  LOS: 1 day    Ermalinda Barrios 02/23/2014, 8:06 AM  Patient seen and discussed with NP Lenze, admitted with atypical chest pain. Trop neg x 3 in setting of hours of ongoing pain, EKG without acute changes. Prior CT PE negative just a few days ago. This appears to likely not be cardiac chest pain. She has a long GI history and history of fibromyalgia/chronic pain. Will obtain echo today, defer stress testing today due to ongoing wheezing and SOB. Decision on inpatient testing pending echo results and improvement of her COPD exacerbation. BP's up and down, could be related to her pain. Increase norvasc to 10mg  today, follow vitals. Would continue ASA 81mg  for the time being.    Carlyle Dolly MD

## 2014-02-23 NOTE — Progress Notes (Signed)
UR Completed Derrin Currey Graves-Bigelow, RN,BSN [phone]  

## 2014-02-24 ENCOUNTER — Other Ambulatory Visit: Payer: Self-pay

## 2014-02-24 ENCOUNTER — Telehealth: Payer: Self-pay

## 2014-02-24 DIAGNOSIS — R079 Chest pain, unspecified: Secondary | ICD-10-CM

## 2014-02-24 MED ORDER — DILTIAZEM HCL 60 MG PO TABS: 60.0000 mg | ORAL_TABLET | Freq: Three times a day (TID) | ORAL | Status: DC

## 2014-02-24 NOTE — Progress Notes (Signed)
Patient ID: [name], female   DOB: 1966/03/11, 48 y.o.   MRN: [id number]    Primary cardiologist:  Subjective:      Objective:   Temp:  [98 F (36.7 C)-98.2 F (36.8 C)] 98.1 F (36.7 C) (08/13 0500) Pulse Rate:  [61-70] 61 (08/13 0500) Resp:  [20] 20 (08/13 0500) BP: (113-155)/(73-85) 113/73 mmHg (08/13 0500) SpO2:  [97 %-100 %] 98 % (08/13 0747) Last BM Date: 02/22/14  Filed Weights   02/22/14 1022 02/22/14 1842  Weight: 155 lb (70.308 kg) 155 lb 10.3 oz (70.6 kg)    Intake/Output Summary (Last 24 hours) at 02/24/14 0930 Last data filed at 02/24/14 0900  Gross per 24 hour  Intake    600 ml  Output   2200 ml  Net  -1600 ml    Telemetry:  Exam:  General:  HEENT:  Resp:  Cardiac:  GI:  MSK:  Neuro:   Psych  Lab Results:  Basic Metabolic Panel:  Recent Labs Lab 02/17/14 1550 02/22/14 1056 02/23/14 0534  NA 139 139 141  K 3.6* 3.8 3.8  CL 100 101 102  CO2 25 25 27   GLUCOSE 120* 108* 86  BUN 12 9 11   CREATININE 0.62 0.75 0.74  CALCIUM 9.8 9.7 9.3    Liver Function Tests:  Recent Labs Lab 02/17/14 1550 02/23/14 0534  AST 25 21  ALT 15 19  ALKPHOS 98 80  BILITOT 0.2* 0.3  PROT 7.5 7.4  ALBUMIN 3.9 3.9    CBC:  Recent Labs Lab 02/17/14 1550 02/22/14 1056 02/23/14 0534  WBC 5.5 5.2 6.3  HGB 12.0 13.2 12.9  HCT 35.7* 39.2 38.7  MCV 97.5 97.0 98.5  PLT 235 248 241    Cardiac Enzymes:  Recent Labs Lab 02/22/14 1806 02/22/14 2321 02/23/14 0534  TROPONINI <0.30 <0.30 <0.30    BNP: No results found for this basename: PROBNP,  in the last 8760 hours  Coagulation: No results found for this basename: INR,  in the last 168 hours  ECG:   Medications:   Scheduled Medications: . albuterol  4 mg Oral TID  . amLODipine  10 mg Oral Daily  . aspirin EC  81 mg Oral Daily  . buPROPion  300 mg Oral Daily  . citalopram  20 mg Oral QHS  . heparin  5,000 Units Subcutaneous 3 times per day  . ipratropium-albuterol   3 mL Nebulization TID  . labetalol  200 mg Oral BID  . lisinopril  20 mg Oral Daily  . mirtazapine  15 mg Oral QHS  . nitroGLYCERIN  1 inch Topical 4 times per day  . omega-3 acid ethyl esters  2 g Oral Daily  . pantoprazole  40 mg Oral BID AC  . sodium chloride  3 mL Intravenous Q12H     Infusions:     PRN Medications:  albuterol, diazepam, neomycin-bacitracin-polymyxin, ondansetron (ZOFRAN) IV, ondansetron, oxyCODONE, oxyCODONE-acetaminophen     Assessment/Plan    1. Chest pain  - fairly atypical chest pain in characteristics and duration, reports pain can last for several hours at a time. She has a complex GI history, unclear what role that may be playing. Reports some ongoing dysphagia after recent esophagus dilatation - no evidence of ACS at this time, negative enzymes and stone cold normal EKG. Normal echo - CT PE negative from 02/17/14  - no evidence to support cardiac cause of her atypical symptoms. She has complex history of GI problems as well as chronic  pain/fibromyalgia - no further inpatient cardiac testing, we will arrange outpatient GXT and outpatient f/u with out NP in 2 weeks. Will signoff of inpatient care.   2. COPD/SOB  - per primary team   3. HTN: at goal, continue current meds        Carlyle Dolly, M.D., F.A.C.C.

## 2014-02-24 NOTE — Progress Notes (Signed)
Pt IV removed intact without any complaint.  Pt took a shower prior to discharge.  Discharge instructions reviewed with pt and family, all questions answered.  Pt stated she would follow up with all appts.  Pt walked to main entrance with family and nurse escort.

## 2014-02-24 NOTE — Care Management Note (Signed)
    Page 1 of 1   02/24/2014     1:41:56 PM CARE MANAGEMENT NOTE 02/24/2014  Patient:  [name], [name]   Account Number:  [id number]  Date Initiated:  02/24/2014  Documentation initiated by:  Jolene Provost  Subjective/Objective Assessment:   Patient admitted for r/o chest pain. Patient from home with self care. Patient has neb machine at home from [employer]. Patient has no DME, HH or med needs.     Action/Plan:   Patient will discharge today. Patient plans to discharge home with self care. No CM needs at this time.   Anticipated DC Date:  02/24/2014   Anticipated DC Plan:  [place]  CM consult      Choice offered to / List presented to:             Status of service:  Completed, signed off Medicare Important Message given?   (If response is "NO", the following Medicare IM given date fields will be blank) Date Medicare IM given:   Medicare IM given by:   Date Additional Medicare IM given:   Additional Medicare IM given by:    Discharge Disposition:  HOME/SELF CARE  Per UR Regulation:    If discussed at Long Length of Stay Meetings, dates discussed:    Comments:  02/24/2014 [place], RN, MSN, [facility]

## 2014-02-24 NOTE — Discharge Instructions (Signed)
Esophageal Spasm °Esophageal spasm is an uncoordinated contraction of the muscles of the esophagus (the tube which carries food from your mouth to your stomach). Normally, the muscles of the esophagus alternate between contraction and relaxation starting from the top of the esophagus and working down to the bottom. This moves the food from the mouth to the stomach. In esophageal spasm, all the muscles contract at once. This causes pain and fails to move the food along. As a result, you may have trouble swallowing.  °Women are more likely than men to have esophageal spasm. The cause of the spasms is not known. Sometimes eating hot or cold foods triggers the condition and this may be due to an overly sensitive esophagus. This is not an infectious disease and cannot be passed to others. °SYMPTOMS  °Symptoms of esophageal spasm may include: chest pain, burning or pain with swallowing, and difficulty swallowing.  °DIAGNOSIS  °Esophageal spasm can be diagnosed by a test called manometry (pressure studies of the esophagus). In this test, a special tube is inserted down the esophagus. The tube measures the muscle activity of the esophagus. Abnormal contractions mixed with normal movement helps confirm the diagnosis.  °A person with a hypersensitive esophagus may be diagnosed by inflating a long balloon in the person's esophagus. If this causes the same symptoms, preventive methods may work. °PREVENTION  °Avoid hot or cold foods if that seems to be a trigger. °PROGNOSIS  °This condition does not go away, nor is treatment entirely satisfactory. Patients need to be careful of what they eat. They need to continue on medication if a useful one is found. Fortunately, the condition does not get progressively worse as time passes. °Esophageal spasm does not usually lead to more serious problems but sometimes the pain can be disabling. If a person becomes afraid to eat they may become malnourished and lose weight.  °TREATMENT  °· A  procedure in which instruments of increasing size are inserted through the esophagus to enlarge (dilate) it are used. °· Medications that decrease acid-production of the stomach may be used such as proton-pump inhibitors or H2-blockers. °· Medications of several types can be used to relax the muscles of the esophagus. °· An individual with a hypersensitive esophagus sometimes improves with low doses of medications normally used for depression. °· No treatment for esophageal spasm is effective for everyone. Often several approaches will be tried before one works. In many cases, the symptoms will improve, but will not go away completely. °· For severe cases, relief is obtained two-thirds of the time by cutting the muscles along the entire length of the esophagus. This is a major surgical procedure. °· Your symptoms are usually the best guide to how well the treatment for esophageal spasm works. °SIDE EFFECTS OF TREATMENTS °· Nitrates can cause headaches and low blood pressure. °· Calcium channel blockers can cause: °¨ Feeling sick to your stomach (nausea). °¨ Constipation and other side effects. °· Antidepressants can cause side effects that depend on the medication used. °HOME CARE INSTRUCTIONS  °· Let your caregiver know if problems are getting worse, or if you get food stuck in your esophagus for longer than 1 hour or as directed and are unable to swallow liquid. °· Take medications as directed and with permission of your caregiver. Ask about what to do if a medication seems to get stuck in your esophagus. Only take over-the-counter or prescription medicines for pain, discomfort, or fever as directed by your caregiver. °· Soft and liquid foods   pass more easily than solid pieces. °SEEK IMMEDIATE MEDICAL CARE IF:  °· You develop severe chest pain, especially if the pain is crushing or pressure-like and spreads to the arms, back, neck, or jaw, or if you have sweating, nausea, or shortness of breath. THIS COULD BE AN  EMERGENCY. Do not wait to see if the pain will go away. Get medical help at once. Call 911 or 0 (operator). DO NOT drive yourself to the hospital. °· Your chest pain gets worse and does not go away with rest. °· You have an attack of chest pain lasting longer than usual despite rest and treatment with the medications your physician has prescribed. °· You wake from sleep with chest pain or shortness of breath. °· You feel dizzy or faint. °· You have chest pain, not typical of your usual pain, caused by your esophagus for which you originally saw your caregiver. °MAKE SURE YOU:  °· Understand these instructions. °· Will watch your condition. °· Will get help right away if you are not doing well or get worse. °Document Released: 09/21/2002 Document Revised: 09/23/2011 Document Reviewed: 09/24/2013 °ExitCare® Patient Information ©2015 [employer]. This information is not intended to replace advice given to you by your health care provider. Make sure you discuss any questions you have with your health care provider. ° °

## 2014-02-24 NOTE — Telephone Encounter (Signed)
Message copied by Bernita Raisin on Thu Feb 24, 2014 10:12 AM ------      Message from: [place] F      Created: Thu Feb 24, 2014  9:40 AM       Inpatient being discharged today. Needs f/u [facility] in 2 weeks, GXT as outpatient. Needs to hold labetlol the day of the test. Bring inhalers to stress test.                  Zandra Abts MD ------

## 2014-02-24 NOTE — Discharge Summary (Signed)
Physician Discharge Summary  [name] MVE:[id number] DOB: 01-29-1966 DOA: 02/22/2014  PCP: Glo Herring., MD  Admit date: 02/22/2014 Discharge date: 02/24/2014  Time spent: 40 minutes  Recommendations for Outpatient Follow-up:  1. Patient will follow up with GI in the next one to 2 weeks to further address her esophageal issues 2. She's been set up with cardiology as an outpatient to undergo stress test.  Discharge Diagnoses:  Active Problems:   Hypertension   GERD (gastroesophageal reflux disease)   Anxiety disorder   Chronic pain syndrome   Dysphagia, unspecified(787.20)   Chest pain   Chest pain at rest, possibly related to esophageal spasm   Discharge Condition: Improving  Diet recommendation: Low salt  Filed Weights   02/22/14 1022 02/22/14 1842  Weight: 70.308 kg (155 lb) 70.6 kg (155 lb 10.3 oz)    History of present illness and hospital course:  This patient was admitted to the hospital with ongoing substernal chest discomfort. She ruled out for ACS with negative cardiac markers. He was felt that her pain was somewhat atypical for cardiac origin. She was seen by cardiology who recommended outpatient stress test. The patient recently had a CT angio of her chest which was negative for pulmonary embolus. She has a long history of GI complaints and has had multiple esophageal dilatations in the past, most recently on 12/31/2013. At that time, EGD did not show any esophagitis/gastritis. It was felt that she may have some esophageal spasm and therefore she was started on nitroglycerin paste. The patient did feel somewhat better with this measure. She's been transitioned to oral diltiazem 3 times a day for esophageal spasm and her labetalol has been discontinued. She will need to follow up with gastroenterology for further testing if necessary and possible repeat endoscopy with esophageal dilatation. Patient is feeling significantly improved, she is tolerating a diet without  difficulty and is ready for discharge home.  Procedures: Echo:- Left ventricle: The cavity size was normal. Wall thickness was normal. Systolic function was normal. The estimated ejection fraction was in the range of 55% to 60%. Doppler parameters are consistent with abnormal left ventricular relaxation (grade 1 diastolic dysfunction).    Consultations:  Cardiology  Discharge Exam: Filed Vitals:   02/24/14 1420  BP: 120/70  Pulse: 73  Temp: 98.1 F (36.7 C)  Resp: 20    General: NAD Cardiovascular: S1, S2 RRR Respiratory: CTA B. Very minimal wheeze  Discharge Instructions You were cared for by a hospitalist during your hospital stay. If you have any questions about your discharge medications or the care you received while you were in the hospital after you are discharged, you can call the unit and asked to speak with the hospitalist on call if the hospitalist that took care of you is not available. Once you are discharged, your primary care physician will handle any further medical issues. Please note that NO REFILLS for any discharge medications will be authorized once you are discharged, as it is imperative that you return to your primary care physician (or establish a relationship with a primary care physician if you do not have one) for your aftercare needs so that they can reassess your need for medications and monitor your lab values.  Discharge Instructions   Call MD for:  difficulty breathing, headache or visual disturbances    Complete by:  As directed      Call MD for:  severe uncontrolled pain    Complete by:  As directed  Diet - low sodium heart healthy    Complete by:  As directed      Increase activity slowly    Complete by:  As directed             Medication List    STOP taking these medications       amLODipine 5 MG tablet  Commonly known as:  NORVASC     labetalol 100 MG tablet  Commonly known as:  NORMODYNE      TAKE these medications        albuterol (2.5 MG/3ML) 0.083% nebulizer solution  Commonly known as:  PROVENTIL  Take 2.5 mg by nebulization every 6 (six) hours as needed. For shortness of breath     albuterol 108 (90 BASE) MCG/ACT inhaler  Commonly known as:  PROVENTIL HFA;VENTOLIN HFA  Inhale 2 puffs into the lungs every 6 (six) hours as needed for wheezing or shortness of breath. For shortness of breath     albuterol 4 MG tablet  Commonly known as:  PROVENTIL  Take 4 mg by mouth 3 (three) times daily.     buPROPion 300 MG 24 hr tablet  Commonly known as:  WELLBUTRIN XL  Take 300 mg by mouth every morning.     citalopram 20 MG tablet  Commonly known as:  CELEXA  Take 20 mg by mouth at bedtime.     diazepam 10 MG tablet  Commonly known as:  VALIUM  Take 10 mg by mouth every 6 (six) hours as needed for anxiety.     diltiazem 60 MG tablet  Commonly known as:  CARDIZEM  Take 1 tablet (60 mg total) by mouth 3 (three) times daily.     fish oil-omega-3 fatty acids 1000 MG capsule  Take 2 g by mouth daily.     lisinopril 20 MG tablet  Commonly known as:  PRINIVIL,ZESTRIL  Take 1 tablet (20 mg total) by mouth daily.     mirtazapine 15 MG tablet  Commonly known as:  REMERON  Take 15 mg by mouth at bedtime.     pantoprazole 40 MG tablet  Commonly known as:  PROTONIX  Take 1 tablet (40 mg total) by mouth 2 (two) times daily before a meal.       No Known Allergies     Follow-up Information   Follow up with [facility] On 03/02/2014. (Register at  the main entrance at 10:00 am for a GRX)    Contact information:   [address] [place] [phone]      Follow up with Jory Sims, NP On 03/10/2014. (at 1:10 pm)    Specialty:  Nurse Practitioner   Contact information:   [place] [address] [phone]       Follow up with Rogene Houston, MD. Schedule an appointment as soon as possible for a visit in 2 weeks.   Specialty:  Gastroenterology    Contact information:   [address], [address] [place] [address] [phone]        The results of significant diagnostics from this hospitalization (including imaging, microbiology, ancillary and laboratory) are listed below for reference.    Significant Diagnostic Studies: Dg Chest 2 View  02/17/2014   CLINICAL DATA:  Chest pain  EXAM: CHEST  2 VIEW  COMPARISON:  10/20/2012  FINDINGS: The heart size and mediastinal contours are within normal limits. Both lungs are clear. The visualized skeletal structures are unremarkable.  IMPRESSION:  No active cardiopulmonary disease.   Electronically Signed   By: Franchot Gallo M.D.   On: 02/17/2014 18:46   Ct Angio Chest Pe W/cm &/or Wo Cm  02/17/2014   CLINICAL DATA:  Chest pain.  EXAM: CT ANGIOGRAPHY CHEST WITH CONTRAST  TECHNIQUE: Multidetector CT imaging of the chest was performed using the standard protocol during bolus administration of intravenous contrast. Multiplanar CT image reconstructions and MIPs were obtained to evaluate the vascular anatomy.  CONTRAST:  160mL OMNIPAQUE IOHEXOL 350 MG/ML SOLN  COMPARISON:  Chest x-ray 02/17/2014.  FINDINGS: Thoracic aorta is unremarkable. No aneurysm or dissection. Aberrant right subclavian artery noted. This is a normal variant. Great vessels are patent. Pulmonary arteries are normal. Heart size normal.  No mediastinal or hilar adenopathy. Thoracic esophagus is unremarkable.  Large airways are patent. Lungs are clear. No infiltrates. No pleural effusion or pneumothorax.  Visualized upper abdominal viscera unremarkable.  Cholecystectomy.  Thyroid is unremarkable. No significant axillary adenopathy. No acute bony abnormality. Degenerative changes thoracic spine.  Review of the MIP images confirms the above findings.  IMPRESSION: 1. No acute abnormality. No evidence of dissection or pulmonary embolus. 2. Aberrant right subclavian artery.  This is a normal variant.   Electronically Signed   By: [place]    On: 02/17/2014 20:15   Dg Chest Port 1 View  02/22/2014   CLINICAL DATA:  Chest pain.  EXAM: PORTABLE CHEST - 1 VIEW  COMPARISON:  Chest CT 02/17/2014.  FINDINGS: Cardiopericardial silhouette within normal limits. Mediastinal contours normal. Trachea midline. No airspace disease or effusion. Monitoring leads project over the chest.  IMPRESSION: No active disease.   Electronically Signed   By: Dereck Ligas M.D.   On: 02/22/2014 10:49    Microbiology: No results found for this or any previous visit (from the past 240 hour(s)).   Labs: Basic Metabolic Panel:  Recent Labs Lab 02/22/14 1056 02/23/14 0534  NA 139 141  K 3.8 3.8  CL 101 102  CO2 25 27  GLUCOSE 108* 86  BUN 9 11  CREATININE 0.75 0.74  CALCIUM 9.7 9.3   Liver Function Tests:  Recent Labs Lab 02/23/14 0534  AST 21  ALT 19  ALKPHOS 80  BILITOT 0.3  PROT 7.4  ALBUMIN 3.9   No results found for this basename: LIPASE, AMYLASE,  in the last 168 hours No results found for this basename: AMMONIA,  in the last 168 hours CBC:  Recent Labs Lab 02/22/14 1056 02/23/14 0534  WBC 5.2 6.3  HGB 13.2 12.9  HCT 39.2 38.7  MCV 97.0 98.5  PLT 248 241   Cardiac Enzymes:  Recent Labs Lab 02/22/14 1056 02/22/14 1806 02/22/14 2321 02/23/14 0534  TROPONINI <0.30 <0.30 <0.30 <0.30   BNP: BNP (last 3 results) No results found for this basename: PROBNP,  in the last 8760 hours CBG: No results found for this basename: GLUCAP,  in the last 168 hours     Signed:  MEMON,JEHANZEB  [facility] 02/24/2014, 7:52 PM

## 2014-02-24 NOTE — Telephone Encounter (Signed)
Mailed letter to pt with MD instructions regarding meds to be held morning of test (labetolol) and to bring inhalers and also gxt instruction sheet

## 2014-03-02 ENCOUNTER — Ambulatory Visit (HOSPITAL_COMMUNITY): Admit: 2014-03-02 | Payer: Medicaid Other

## 2014-03-10 ENCOUNTER — Encounter: Payer: Medicaid Other | Admitting: Adult Health

## 2014-03-14 ENCOUNTER — Ambulatory Visit (INDEPENDENT_AMBULATORY_CARE_PROVIDER_SITE_OTHER): Payer: Medicaid Other | Admitting: Internal Medicine

## 2014-03-14 ENCOUNTER — Encounter (INDEPENDENT_AMBULATORY_CARE_PROVIDER_SITE_OTHER): Payer: Self-pay | Admitting: Internal Medicine

## 2014-03-14 VITALS — BP 130/90 | HR 74 | Temp 97.4°F | Resp 18 | Ht 64.0 in | Wt 161.9 lb

## 2014-03-14 DIAGNOSIS — K219 Gastro-esophageal reflux disease without esophagitis: Secondary | ICD-10-CM

## 2014-03-14 DIAGNOSIS — R0789 Other chest pain: Secondary | ICD-10-CM

## 2014-03-14 NOTE — Progress Notes (Signed)
Presenting complaint;  Chest pain.  Subjective:  Patient is 48 year old Caucasian female who has chronic GERD and history of dysphagia. He was last seen on 02/22/2014 by Ms.Setzer, NP for chest pain which was felt to be atypical and possibly unrelated to UGI tract. Her blood pressure was noted to be elevated and she was advised to followup with Dr. Gerarda Fraction. That afternoon her pain got worse and she was admitted to [facility]. She had chest CT and showed was negative for pulmonary embolism. She was seen by Dr. Roderic Palau branch of cardiology service. She ruled out for MI. She had an echocardiography which revealed grade 1 diastolic dysfunction. Blood pressure medications were changed. She followed up with Dr. Gerarda Fraction last week when Cardizem dose was increased and she was begun on Carafate and this visit was arranged. Patient states she is feeling much better. She has had no chest pain whatsoever. She is also having occasional heartburn which occurs with certain foods. Chest pain that she was having was retrosternal described to be sharp and at times like a spasm and not associated with shortness of breath nausea vomiting or diaphoresis. She says she's had this pain off and on for couple of years but was worse over the last 2 months. She also reports improvement in dysphagia. She has some difficulty with meats. She denies abdominal pain melena or rectal bleeding. Her weight is up by 6 pounds since his last visit 3 weeks ago.  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 Esophagus was dilated by passing a 54 [place] Maloney dilator. She had another EGD in March 2014 and esophagus was dilated by passing a 54 [place] Maloney dilator disrupting the web. She had another EGD on 12/31/2013 and esophagus was dilated by passing 56 [place] Maloney dilator and once again esophageal web was disrupted. She continued to have dysphagia  and therefore had barium pill study on 01/21/2014 which revealed slight narrowing at GE junction with transient holdup of barium pill.   Current Medications: Outpatient Encounter Prescriptions as of 03/14/2014  Medication Sig  . albuterol (PROVENTIL HFA;VENTOLIN HFA) 108 (90 BASE) MCG/ACT inhaler Inhale 2 puffs into the lungs every 6 (six) hours as needed for wheezing or shortness of breath. For shortness of breath  . albuterol (PROVENTIL) (2.5 MG/3ML) 0.083% nebulizer solution Take 2.5 mg by nebulization every 6 (six) hours as needed. For shortness of breath  . albuterol (PROVENTIL) 4 MG tablet Take 4 mg by mouth 3 (three) times daily.  Marland Kitchen buPROPion (WELLBUTRIN XL) 300 MG 24 hr tablet Take 300 mg by mouth every morning.    . diazepam (VALIUM) 10 MG tablet Take 10 mg by mouth every 6 (six) hours as needed for anxiety.  Marland Kitchen diltiazem (CARDIZEM CD) 240 MG 24 hr capsule Take 240 mg by mouth daily.  . fish oil-omega-3 fatty acids 1000 MG capsule Take 2 g by mouth daily.   Marland Kitchen lisinopril (PRINIVIL,ZESTRIL) 20 MG tablet Take 1 tablet (20 mg total) by mouth daily.  Marland Kitchen oxyCODONE-acetaminophen (PERCOCET) 10-325 MG per tablet Take 1 tablet by mouth as needed for pain.  . pantoprazole (PROTONIX) 40 MG tablet Take 1 tablet (40 mg total) by mouth 2 (two) times daily before a meal.  . sucralfate (CARAFATE) 1 GM/10ML suspension Take 1 g by mouth 4 (four) times daily -  with meals and at bedtime.  . citalopram (CELEXA) 20 MG tablet Take 20 mg  by mouth at bedtime.   . [DISCONTINUED] diltiazem (CARDIZEM) 60 MG tablet Take 1 tablet (60 mg total) by mouth 3 (three) times daily.  . [DISCONTINUED] mirtazapine (REMERON) 15 MG tablet Take 15 mg by mouth at bedtime.     Objective: Blood pressure 130/90, pulse 74, temperature 97.4 F (36.3 C), temperature source Oral, resp. rate 18, height 5\' 4"  (1.626 m), weight 161 lb 14.4 oz (73.437 kg). Patient is alert and in no acute distress. Conjunctiva is pink. Sclera is  nonicteric Oropharyngeal mucosa is normal. No neck masses or thyromegaly noted. Cardiac exam with regular rhythm normal S1 and S2. No murmur or gallop noted. Lungs are clear to auscultation. Abdomen is symmetrical soft and nontender without organomegaly or masses.  No LE edema or clubbing noted.  Labs/studies Results: CBC and comprehensive chemistry panel from 02/23/2014 reviewed and is normal.   Assessment:  #1. Atypical chest pain either secondary to esophageal spasm or bile reflux. No further workup unless pain recurs on frequent basis. #2. GERD. Symptoms are well-controlled with therapy. She will stay on pantoprazole twice a day schedule for now.  Plan:  Patient advised to keep symptom diary as to timing duration of chest pain and ssociated symptoms. She will drop sucralfate dose to 1 g twice a day after 4 weeks. Office visit in 2 months.

## 2014-03-14 NOTE — Patient Instructions (Signed)
Keep symptom diary as discussed(time and duration of pain; associated symptoms and relationship to meals). Decrease Carafate sucralfate dose to twice daily after 4 weeks(before breakfast and at bedtime)

## 2014-05-16 ENCOUNTER — Encounter (INDEPENDENT_AMBULATORY_CARE_PROVIDER_SITE_OTHER): Payer: Self-pay | Admitting: Internal Medicine

## 2014-05-23 ENCOUNTER — Ambulatory Visit (INDEPENDENT_AMBULATORY_CARE_PROVIDER_SITE_OTHER): Payer: Medicaid Other | Admitting: Internal Medicine

## 2014-06-25 ENCOUNTER — Emergency Department (HOSPITAL_COMMUNITY): Payer: Medicaid Other

## 2014-06-25 ENCOUNTER — Emergency Department (HOSPITAL_COMMUNITY)
Admission: EM | Admit: 2014-06-25 | Discharge: 2014-06-25 | Disposition: A | Discharge status: Home / Self Care | Payer: Medicaid Other | Attending: Emergency Medicine | Admitting: Emergency Medicine

## 2014-06-25 ENCOUNTER — Encounter (HOSPITAL_COMMUNITY): Payer: Self-pay | Admitting: Emergency Medicine

## 2014-06-25 DIAGNOSIS — K219 Gastro-esophageal reflux disease without esophagitis: Secondary | ICD-10-CM | POA: Diagnosis not present

## 2014-06-25 DIAGNOSIS — Y9289 Other specified places as the place of occurrence of the external cause: Secondary | ICD-10-CM | POA: Insufficient documentation

## 2014-06-25 DIAGNOSIS — Y9352 Activity, horseback riding: Secondary | ICD-10-CM | POA: Diagnosis not present

## 2014-06-25 DIAGNOSIS — R079 Chest pain, unspecified: Secondary | ICD-10-CM | POA: Insufficient documentation

## 2014-06-25 DIAGNOSIS — G894 Chronic pain syndrome: Secondary | ICD-10-CM | POA: Diagnosis not present

## 2014-06-25 DIAGNOSIS — S3992XA Unspecified injury of lower back, initial encounter: Secondary | ICD-10-CM | POA: Diagnosis present

## 2014-06-25 DIAGNOSIS — J449 Chronic obstructive pulmonary disease, unspecified: Secondary | ICD-10-CM | POA: Diagnosis not present

## 2014-06-25 DIAGNOSIS — W19XXXA Unspecified fall, initial encounter: Secondary | ICD-10-CM

## 2014-06-25 DIAGNOSIS — Z8659 Personal history of other mental and behavioral disorders: Secondary | ICD-10-CM | POA: Insufficient documentation

## 2014-06-25 DIAGNOSIS — Y998 Other external cause status: Secondary | ICD-10-CM | POA: Insufficient documentation

## 2014-06-25 DIAGNOSIS — Z79899 Other long term (current) drug therapy: Secondary | ICD-10-CM | POA: Insufficient documentation

## 2014-06-25 DIAGNOSIS — S32000A Wedge compression fracture of unspecified lumbar vertebra, initial encounter for closed fracture: Secondary | ICD-10-CM

## 2014-06-25 DIAGNOSIS — I10 Essential (primary) hypertension: Secondary | ICD-10-CM | POA: Insufficient documentation

## 2014-06-25 DIAGNOSIS — S32010A Wedge compression fracture of first lumbar vertebra, initial encounter for closed fracture: Secondary | ICD-10-CM | POA: Diagnosis not present

## 2014-06-25 DIAGNOSIS — S0990XA Unspecified injury of head, initial encounter: Secondary | ICD-10-CM | POA: Diagnosis not present

## 2014-06-25 DIAGNOSIS — S3991XA Unspecified injury of abdomen, initial encounter: Secondary | ICD-10-CM | POA: Insufficient documentation

## 2014-06-25 LAB — URINALYSIS, ROUTINE W REFLEX MICROSCOPIC
Bilirubin Urine: NEGATIVE
Glucose, UA: NEGATIVE mg/dL
Hgb urine dipstick: NEGATIVE
Leukocytes, UA: NEGATIVE
Nitrite: NEGATIVE
Protein, ur: NEGATIVE mg/dL
Specific Gravity, Urine: 1.01 (ref 1.005–1.030)
Urobilinogen, UA: 0.2 mg/dL (ref 0.0–1.0)
pH: 5.5 (ref 5.0–8.0)

## 2014-06-25 LAB — CBC WITH DIFFERENTIAL/PLATELET
Basophils Absolute: 0 10*3/uL (ref 0.0–0.1)
Basophils Relative: 1 % (ref 0–1)
Eosinophils Absolute: 0.1 10*3/uL (ref 0.0–0.7)
Eosinophils Relative: 1 % (ref 0–5)
HCT: 39 % (ref 36.0–46.0)
Hemoglobin: 13 g/dL (ref 12.0–15.0)
Lymphocytes Relative: 28 % (ref 12–46)
Lymphs Abs: 2.5 10*3/uL (ref 0.7–4.0)
MCH: 32 pg (ref 26.0–34.0)
MCHC: 33.3 g/dL (ref 30.0–36.0)
MCV: 96.1 fL (ref 78.0–100.0)
Monocytes Absolute: 0.5 10*3/uL (ref 0.1–1.0)
Monocytes Relative: 6 % (ref 3–12)
Neutro Abs: 5.6 10*3/uL (ref 1.7–7.7)
Neutrophils Relative %: 64 % (ref 43–77)
Platelets: 279 10*3/uL (ref 150–400)
RBC: 4.06 MIL/uL (ref 3.87–5.11)
RDW: 12.7 % (ref 11.5–15.5)
WBC: 8.7 10*3/uL (ref 4.0–10.5)

## 2014-06-25 LAB — COMPREHENSIVE METABOLIC PANEL
ALT: 29 U/L (ref 0–35)
AST: 29 U/L (ref 0–37)
Albumin: 4.1 g/dL (ref 3.5–5.2)
Alkaline Phosphatase: 113 U/L (ref 39–117)
Anion gap: 20 — ABNORMAL HIGH (ref 5–15)
BUN: 14 mg/dL (ref 6–23)
CALCIUM: 9.2 mg/dL (ref 8.4–10.5)
CO2: 20 mEq/L (ref 19–32)
Chloride: 97 mEq/L (ref 96–112)
Creatinine, Ser: 0.96 mg/dL (ref 0.50–1.10)
GFR calc Af Amer: 81 mL/min — ABNORMAL LOW (ref >90)
GFR calc non Af Amer: 70 mL/min — ABNORMAL LOW (ref >90)
Glucose, Bld: 126 mg/dL — ABNORMAL HIGH (ref 70–99)
Potassium: 3.7 mEq/L (ref 3.7–5.3)
SODIUM: 137 meq/L (ref 137–147)
TOTAL PROTEIN: 7.7 g/dL (ref 6.0–8.3)
Total Bilirubin: 0.3 mg/dL (ref 0.3–1.2)

## 2014-06-25 MED ORDER — KETOROLAC TROMETHAMINE 30 MG/ML IJ SOLN
30.0000 mg | Freq: Once | INTRAMUSCULAR | Status: AC
  Administered 2014-06-25: 30 mg via INTRAVENOUS
  Filled 2014-06-25: qty 1

## 2014-06-25 MED ORDER — FENTANYL CITRATE 0.05 MG/ML IJ SOLN
INTRAMUSCULAR | Status: AC
  Filled 2014-06-25: qty 2

## 2014-06-25 MED ORDER — FENTANYL CITRATE 0.05 MG/ML IJ SOLN
100.0000 ug | Freq: Once | INTRAMUSCULAR | Status: AC
  Administered 2014-06-25: 100 ug via INTRAVENOUS

## 2014-06-25 MED ORDER — IOHEXOL 300 MG/ML  SOLN
100.0000 mL | Freq: Once | INTRAMUSCULAR | Status: AC | PRN
  Administered 2014-06-25: 100 mL via INTRAVENOUS

## 2014-06-25 MED ORDER — MORPHINE SULFATE 4 MG/ML IJ SOLN
4.0000 mg | Freq: Once | INTRAMUSCULAR | Status: AC
  Administered 2014-06-25: 4 mg via INTRAVENOUS
  Filled 2014-06-25: qty 1

## 2014-06-25 MED ORDER — ONDANSETRON HCL 4 MG/2ML IJ SOLN
4.0000 mg | Freq: Once | INTRAMUSCULAR | Status: AC
  Administered 2014-06-25: 4 mg via INTRAVENOUS
  Filled 2014-06-25: qty 2

## 2014-06-25 MED ORDER — HYDROMORPHONE HCL 1 MG/ML IJ SOLN
1.0000 mg | Freq: Once | INTRAMUSCULAR | Status: AC
  Administered 2014-06-25: 1 mg via INTRAVENOUS
  Filled 2014-06-25: qty 1

## 2014-06-25 MED ORDER — IBUPROFEN 800 MG PO TABS: 800.0000 mg | ORAL_TABLET | Freq: Three times a day (TID) | ORAL | Status: DC

## 2014-06-25 MED ORDER — OMEPRAZOLE 20 MG PO CPDR: 20.0000 mg | DELAYED_RELEASE_CAPSULE | Freq: Every day | ORAL | Status: DC

## 2014-06-25 MED ORDER — FENTANYL CITRATE 0.05 MG/ML IJ SOLN
100.0000 ug | Freq: Once | INTRAMUSCULAR | Status: DC
  Filled 2014-06-25: qty 2

## 2014-06-25 MED ORDER — FENTANYL CITRATE 0.05 MG/ML IJ SOLN
100.0000 ug | Freq: Once | INTRAMUSCULAR | Status: AC
  Administered 2014-06-25: 100 ug via INTRAVENOUS
  Filled 2014-06-25: qty 2

## 2014-06-25 NOTE — ED Notes (Signed)
PT states she was thrown from a horse today and landed on her left side and felt something pop in her lower back. PT c/o lower back pain with no hx of back pain.

## 2014-06-25 NOTE — ED Notes (Signed)
Randy with [facility] called back to let us know that they could not  Deliver a brace after hours.

## 2014-06-25 NOTE — ED Notes (Addendum)
Pt urinated on bedpan unable to collect for specimen due to contamination, appeared yellow and clear

## 2014-06-25 NOTE — ED Notes (Signed)
Pt waiting on special brace

## 2014-06-25 NOTE — Discharge Instructions (Signed)
Back, Compression Fracture Take your pain medication as prescribed, wear the brace and follow-up with Dr. Ellene Route. Return to the ED if you develop worsening pain, weakness or any other concerns. A compression fracture happens when a force is put upon the length of your spine. Slipping and falling on your bottom are examples of such a force. When this happens, sometimes the force is great enough to compress the building blocks (vertebral bodies) of your spine. Although this causes a lot of pain, this can usually be treated at home, unless your caregiver feels hospitalization is needed for pain control. Your backbone (spinal column) is made up of 24 main vertebral bodies in addition to the sacrum and coccyx (see illustration). These are held together by tough fibrous tissues (ligaments) and by support of your muscles. Nerve roots pass through the openings between the vertebrae. A sudden wrenching move, injury, or a fall may cause a compression fracture of one of the vertebral bodies. This may result in back pain or spread of pain into the belly (abdomen), the buttocks, and down the leg into the foot. Pain may also be created by muscle spasm alone. Large studies have been undertaken to determine the best possible course of action to help your back following injury and also to prevent future problems. The recommendations are as follows. FOLLOWING A COMPRESSION FRACTURE: Do the following only if advised by your caregiver.   If a back brace has been suggested or provided, wear it as directed.  Do not stop wearing the back brace unless instructed by your caregiver.  When allowed to return to regular activities, avoid a sedentary lifestyle. Actively exercise. Sporadic weekend binges of tennis, racquetball, or waterskiing may actually aggravate or create problems, especially if you are not in condition for that activity.  Avoid sports requiring sudden body movements until you are in condition for them. Swimming  and walking are safer activities.  Maintain good posture.  Avoid obesity.  If not already done, you should have a DEXA scan. Based on the results, be treated for osteoporosis. FOLLOWING ACUTE (SUDDEN) INJURY:  Only take over-the-counter or prescription medicines for pain, discomfort, or fever as directed by your caregiver.  Use bed rest for only the most extreme acute episode. Prolonged bed rest may aggravate your condition. Ice used for acute conditions is effective. Use a large plastic bag filled with ice. Wrap it in a towel. This also provides excellent pain relief. This may be continuous. Or use it for 30 minutes every 2 hours during acute phase, then as needed. Heat for 30 minutes prior to activities is helpful.  As soon as the acute phase (the time when your back is too painful for you to do normal activities) is over, it is important to resume normal activities and work [employer] information [employer]. Back injuries can cause potentially marked changes in lifestyle. So it is important to attack these problems aggressively.  See your caregiver for continued problems. He or she can help or refer you for appropriate exercises, physical therapy, and work hardening if needed.  If you are given narcotic medications for your condition, for the next 24 hours do not:  Drive.  Operate machinery or power tools.  Sign legal documents.  Do not drink alcohol, or take sleeping pills or other medications that may interfere with treatment. If your caregiver has given you a follow-up appointment, it is very important to keep that appointment. Not keeping the appointment could result in a chronic or permanent injury, pain, and  disability. If there is any problem keeping the appointment, you must call back to this facility for assistance.  SEEK IMMEDIATE MEDICAL CARE IF:  You develop numbness, tingling, weakness, or problems with the use of your arms or legs.  You develop severe back pain not relieved with  medications.  You have changes in bowel or bladder control.  You have increasing pain in any areas of the body. Document Released: 07/01/2005 Document Revised: 11/15/2013 Document Reviewed: 02/03/2008 [facility] Patient Information 2015 [place], [place]. This information is not intended to replace advice given to you by your health care provider. Make sure you discuss any questions you have with your health care provider.

## 2014-06-25 NOTE — ED Notes (Signed)
Pt is drinking a sprite right now. She is taking her time of drinking the sprite. I asked the patient if she still feels nausea or anything. She stated that she does not. She did state that it does hurt to breath.

## 2014-06-25 NOTE — ED Notes (Signed)
Pt requesting to see Dr. Wyvonnia Dusky

## 2014-06-25 NOTE — ED Notes (Signed)
Pt urinated in bedpan again, post void residual <39mL

## 2014-06-25 NOTE — ED Notes (Signed)
Sidman [phone]) as requested by [facility] to request a TLSO Brace to be delivered to [facility] for this Pt. I was informed, "that they will send the order out and she will see if anyone respond to the request".  Charge nurse informed.

## 2014-06-25 NOTE — ED Provider Notes (Signed)
CSN: [id number]     Arrival date & time 06/25/14  1422 History  This chart was scribe for No att. providers found by Judithann Sauger, ED Scribe. The patient was seen in room APOTF/OTF and the patient's care was started at 3:15 PM.     Chief Complaint  Patient presents with  . Fall   The history is provided by the patient. No language interpreter was used.   HPI Comments: [name] is a 48 y.o. female who presents to the Emergency Department status post fall that occurred about an hour ago. She explains that she was thrown off a horse and landed twisted on her left side and she heard something "pop". She denies hitting her head and LOC. She reports associated side back pain and lower abdominal pain. She denies N/V. She adds that the pain is worse when she breathes in. She denies loss of control over her BM. No weakness, numbness, tingling or incontinence.  Past Medical History  Diagnosis Date  . Asthma   . COPD (chronic obstructive pulmonary disease)   . Hypertension   . GERD (gastroesophageal reflux disease)   . Anxiety disorder   . Chronic pain syndrome   . Anxiety   . Depression   . Fibromyalgia   . Epigastric pain 03/29/2013  . Fatigue 03/29/2013   Past Surgical History  Procedure Laterality Date  . Cholecystectomy    . Cesarean section    . Carpal tunnel release    . Esophagogastroduodenoscopy  07/31/2011    Procedure: ESOPHAGOGASTRODUODENOSCOPY (EGD);  Surgeon: Rogene Houston, MD;  Location: [facility];  Service: Endoscopy;  Laterality: N/A;  Venia Minks dilation  07/31/2011    Procedure: Venia Minks DILATION;  Surgeon: Rogene Houston, MD;  Location: [facility];  Service: Endoscopy;;  . Endometrial ablation    . Mass excision  05/20/2012    Procedure: EXCISION MASS;  Surgeon: Sanjuana Kava, MD;  Location: [facility];  Service: Orthopedics;  Laterality: Left;  Repair of Fascial Defect Left Knee  . Carpal tunnel release  07/23/2012    Procedure: CARPAL TUNNEL RELEASE;   Surgeon: Sanjuana Kava, MD;  Location: [facility];  Service: Orthopedics;  Laterality: Left;  . Esophagogastroduodenoscopy (egd) with esophageal dilation N/A 09/30/2012    Procedure: ESOPHAGOGASTRODUODENOSCOPY (EGD) WITH ESOPHAGEAL DILATION;  Surgeon: Rogene Houston, MD;  Location: [facility];  Service: Endoscopy;  Laterality: N/A;  245  . Knee surgery Left   . Esophagogastroduodenoscopy N/A 12/31/2013    Procedure: ESOPHAGOGASTRODUODENOSCOPY (EGD);  Surgeon: Rogene Houston, MD;  Location: [facility];  Service: Endoscopy;  Laterality: N/A;  120-moved to 925 Ann to notify pt  . Balloon dilation N/A 12/31/2013    Procedure: BALLOON DILATION;  Surgeon: Rogene Houston, MD;  Location: [facility];  Service: Endoscopy;  Laterality: N/A;  Venia Minks dilation N/A 12/31/2013    Procedure: Venia Minks DILATION;  Surgeon: Rogene Houston, MD;  Location: [facility];  Service: Endoscopy;  Laterality: N/A;  . Savory dilation N/A 12/31/2013    Procedure: SAVORY DILATION;  Surgeon: Rogene Houston, MD;  Location: [facility];  Service: Endoscopy;  Laterality: N/A;   Family History  Problem Relation Age of Onset  . Asthma Mother   . Asthma Father   . Cancer Father    History  Substance Use Topics  . Smoking status: Former Smoker    Types: Cigarettes    Quit date: 10/26/2011  . Smokeless tobacco: Never Used  .  Alcohol Use: 1.2 oz/week    2 Cans of beer per week     Comment: social   OB History    Gravida Para Term Preterm AB TAB SAB Ectopic Multiple Living   2 2        1      Review of Systems  Gastrointestinal: Positive for abdominal pain. Negative for nausea and vomiting.  Musculoskeletal: Positive for back pain.   A complete 10 system review of systems was obtained and all systems are negative except as noted in the HPI and PMH.     Allergies  Review of patient's allergies indicates no known allergies.  Home Medications   Prior to Admission medications   Medication Sig Start Date End  Date Taking? Authorizing Provider  albuterol (PROVENTIL HFA;VENTOLIN HFA) 108 (90 BASE) MCG/ACT inhaler Inhale 2 puffs into the lungs every 6 (six) hours as needed for wheezing or shortness of breath. For shortness of breath 07/31/11  Yes Kathie Dike, MD  albuterol (PROVENTIL) (2.5 MG/3ML) 0.083% nebulizer solution Take 2.5 mg by nebulization every 6 (six) hours as needed. For shortness of breath   Yes Historical Provider, MD  albuterol (PROVENTIL) 4 MG tablet Take 4 mg by mouth 3 (three) times daily.   Yes Historical Provider, MD  diazepam (VALIUM) 10 MG tablet Take 10 mg by mouth every 6 (six) hours as needed for anxiety.   Yes Historical Provider, MD  diltiazem (CARDIZEM CD) 240 MG 24 hr capsule Take 240 mg by mouth daily.   Yes Historical Provider, MD  fish oil-omega-3 fatty acids 1000 MG capsule Take 2 g by mouth daily.    Yes Historical Provider, MD  lisinopril (PRINIVIL,ZESTRIL) 20 MG tablet Take 1 tablet (20 mg total) by mouth daily. 07/31/11  Yes Kathie Dike, MD  oxyCODONE-acetaminophen (PERCOCET) 10-325 MG per tablet Take 1 tablet by mouth every 4 (four) hours as needed for pain.    Yes Historical Provider, MD  pantoprazole (PROTONIX) 40 MG tablet Take 1 tablet (40 mg total) by mouth 2 (two) times daily before a meal. 12/31/13  Yes Rogene Houston, MD  sucralfate (CARAFATE) 1 GM/10ML suspension Take 1 g by mouth 4 (four) times daily -  with meals and at bedtime.   Yes Historical Provider, MD  omeprazole (PRILOSEC) 20 MG capsule Take 1 capsule (20 mg total) by mouth daily. 06/25/14   Ezequiel Essex, MD   BP 110/69 mmHg  Pulse 82  Temp(Src) 97.5 F (36.4 C) (Oral)  Resp 20  Ht 5\' 4"  (1.626 m)  Wt 160 lb (72.576 kg)  BMI 27.45 kg/m2  SpO2 98% Physical Exam  Constitutional: She is oriented to person, place, and time. She appears well-developed and well-nourished. No distress.  HENT:  Head: Normocephalic and atraumatic.  Mouth/Throat: Oropharynx is clear and moist. No oropharyngeal  exudate.  Eyes: Conjunctivae and EOM are normal. Pupils are equal, round, and reactive to light.  Neck: Normal range of motion. Neck supple.  No meningismus.  Cardiovascular: Normal rate, regular rhythm, normal heart sounds and intact distal pulses.   No murmur heard. Pulmonary/Chest: Effort normal and breath sounds normal. No respiratory distress.  Equal breath sounds   Abdominal: Soft. There is no tenderness. There is no rebound and no guarding.  Musculoskeletal: Normal range of motion. She exhibits tenderness. She exhibits no edema.  No C- spine tenderness.Tenderness to LUQ and LLQ. Left peri-spinal lumbar tenderness, no midlin tenderness 5/5 strength in bilateral lower extremities. Ankle plantar and dorsiflexion intact. Great toe  extension intact bilaterally. +2 DP and PT pulses. +2 patellar reflexes bilaterally.   Neurological: She is alert and oriented to person, place, and time. No cranial nerve deficit. She exhibits normal muscle tone. Coordination normal.  Anxious and uncomfortable   Skin: Skin is warm.  No ecchymosis   Psychiatric: She has a normal mood and affect. Her behavior is normal.  Nursing note and vitals reviewed.   ED Course  Procedures (including critical care time) DIAGNOSTIC STUDIES: Oxygen Saturation is 100% on RA, normal  by my interpretation.    COORDINATION OF CARE: 3:20 PM- Pt advised of plan for treatment and pt agrees.    Labs Review Labs Reviewed  COMPREHENSIVE METABOLIC PANEL - Abnormal; Notable for the following:    Glucose, Bld 126 (*)    GFR calc non Af Amer 70 (*)    GFR calc Af Amer 81 (*)    Anion gap 20 (*)    All other components within normal limits  URINALYSIS, ROUTINE W REFLEX MICROSCOPIC - Abnormal; Notable for the following:    Ketones, ur TRACE (*)    All other components within normal limits  CBC WITH DIFFERENTIAL    Imaging Review Ct Head Wo Contrast  06/25/2014   CLINICAL DATA:  48 year old female fell from horse with  severe lower back pain and headache  EXAM: CT HEAD WITHOUT CONTRAST  CT CERVICAL SPINE WITHOUT CONTRAST  TECHNIQUE: Multidetector CT imaging of the head and cervical spine was performed following the standard protocol without intravenous contrast. Multiplanar CT image reconstructions of the cervical spine were also generated.  COMPARISON:  Prior MRI of the cervical spine 01/01/2010; CT of the head and cervical spine 10/25/2008  FINDINGS: CT HEAD FINDINGS  Negative for acute intracranial hemorrhage, acute infarction, mass, mass effect, hydrocephalus or midline shift. Gray-white differentiation is preserved throughout. Globes and orbits are intact and unremarkable. No focal calvarial or soft tissue abnormality. Normal aeration of the mastoid air cells and visualized paranasal sinuses.  CT CERVICAL SPINE FINDINGS  No acute fracture, malalignment or prevertebral soft tissue swelling. Cervical spondylosis with posterior disc osteophyte complex at C5-C6 which results in some narrowing of the central canal. This is been seen on prior imaging. Unremarkable CT appearance of the thyroid gland. No acute soft tissue abnormality. The lung apices are unremarkable.  IMPRESSION: CT HEAD  1. Negative CT CSPINE  1. No acute fracture or malalignment. 2. C5-C6 degenerative disc disease.   Electronically Signed   By: Jacqulynn Cadet M.D.   On: 06/25/2014 16:19   Ct Chest Wo Contrast  06/25/2014   CLINICAL DATA:  Fall from horse today with low back pain and question fractured ribs.  EXAM: CT CHEST WITHOUT CONTRAST  TECHNIQUE: Multidetector CT imaging of the chest was performed following the standard protocol without IV contrast.  COMPARISON:  02/17/2014 and 10/25/2008  FINDINGS: Lungs are adequately inflated without consolidation or effusion. There is no evidence of pneumothorax. Airways are normal. Heart and remaining mediastinal structures are within normal.  Images through the upper abdomen demonstrate evidence of prior  cholecystectomy and are otherwise unremarkable. There is an acute moderate L1 compression fracture. Mild retropulsion of the posterior border of the fracture.  IMPRESSION: No acute findings within the thorax.  Again seen is patient's acute moderate L1 compression fracture with mild retropulsion of the posterior border of the fracture.   Electronically Signed   By: Marin Olp M.D.   On: 06/25/2014 18:11   Ct Cervical Spine Wo Contrast  06/25/2014   CLINICAL DATA:  48 year old female fell from horse with severe lower back pain and headache  EXAM: CT HEAD WITHOUT CONTRAST  CT CERVICAL SPINE WITHOUT CONTRAST  TECHNIQUE: Multidetector CT imaging of the head and cervical spine was performed following the standard protocol without intravenous contrast. Multiplanar CT image reconstructions of the cervical spine were also generated.  COMPARISON:  Prior MRI of the cervical spine 01/01/2010; CT of the head and cervical spine 10/25/2008  FINDINGS: CT HEAD FINDINGS  Negative for acute intracranial hemorrhage, acute infarction, mass, mass effect, hydrocephalus or midline shift. Gray-white differentiation is preserved throughout. Globes and orbits are intact and unremarkable. No focal calvarial or soft tissue abnormality. Normal aeration of the mastoid air cells and visualized paranasal sinuses.  CT CERVICAL SPINE FINDINGS  No acute fracture, malalignment or prevertebral soft tissue swelling. Cervical spondylosis with posterior disc osteophyte complex at C5-C6 which results in some narrowing of the central canal. This is been seen on prior imaging. Unremarkable CT appearance of the thyroid gland. No acute soft tissue abnormality. The lung apices are unremarkable.  IMPRESSION: CT HEAD  1. Negative CT CSPINE  1. No acute fracture or malalignment. 2. C5-C6 degenerative disc disease.   Electronically Signed   By: Jacqulynn Cadet M.D.   On: 06/25/2014 16:19   Ct Abdomen Pelvis W Contrast  06/25/2014   CLINICAL DATA:  Fall  from horse today with low back pain and headaches, initial encounter  EXAM: CT ABDOMEN AND PELVIS WITH CONTRAST  TECHNIQUE: Multidetector CT imaging of the abdomen and pelvis was performed using the standard protocol following bolus administration of intravenous contrast.  CONTRAST:  157mL OMNIPAQUE IOHEXOL 300 MG/ML  SOLN  COMPARISON:  04/27/2013  FINDINGS: The lung bases are well aerated without focal infiltrate or sizable effusion. No pneumothorax is noted.  The liver is diffusely decreased in attenuation consistent with fatty infiltration. The gallbladder has been surgically removed. The spleen, adrenal glands and pancreas are within normal limits. Kidneys are well visualized and show no enhancement abnormality. Delayed images demonstrate normal excretion of contrast material. No visceral injury is seen.  The appendix is within normal limits. No free air is seen. The bladder is well distended. Delayed images through the bladder demonstrate normal contrast opacification. Fluid is noted within the endometrial canal. The previously seen ovarian cysts have resolved in the interval. No free pelvic fluid is seen. The bony structures show changes consistent with an L1 compression deformity.Approximately 20% vertebral body height loss is noted. Mild retropulsion is seen.  IMPRESSION: L1 compression deformity.  No other focal abnormality is seen.   Electronically Signed   By: Inez Catalina M.D.   On: 06/25/2014 16:24   Dg Chest Portable 1 View  06/25/2014   CLINICAL DATA:  Patient complaining of left-sided chest pain as well as back pain after being thrown from a horse today. History of asthma and COPD. Former smoker.  EXAM: PORTABLE CHEST - 1 VIEW  COMPARISON:  02/22/2014  FINDINGS: The heart size and mediastinal contours are within normal limits. Both lungs are clear. The bony thorax is grossly intact.  IMPRESSION: No active disease.   Electronically Signed   By: Lajean Manes M.D.   On: 06/25/2014 15:42     EKG  Interpretation None      MDM   Final diagnoses:  Fall  Lumbar compression fracture, closed, initial encounter   patient thrown from horse landing on left side. Complaining of left paraspinal back pain and pain with  breathing. Denies hitting head or losing consciousness. Breath sounds equal bilaterally. GCS 15, ABCs intact. Neurologically intact.  500 cc on bladder scan. Patient able to urinate on own.  L1 compression fracture d/w Dr. Ellene Route.  He feels this compression fracture is not responsible for urinary retention. Agrees with TLSO, pain control.   TLSO brace fitted to patient. Difficulty controlling pain.  Dr Nehemiah Settle of hospitalists has seen patient and she is now able to ambulate with a walker and wants to go home.  Dr Nehemiah Settle recommends oxycodone 10 mg q4h.  Patient has oxycodone/APAP at home and states she has pain contract and does not want prescriptions.  Avoid NSAIDs in setting of PUD. Follow up with PCP and neurosurgery this week.  I personally performed the services described in this documentation, which was scribed in my presence. The recorded information has been reviewed and is accurate.    Ezequiel Essex, MD 06/26/14 [phone]

## 2014-06-25 NOTE — ED Notes (Addendum)
This nurse was instructed to walk pt in the hallway. Dr. Nehemiah Settle watched pt ambulate and went back in to reassess pt. Pt ambulated with a walker and 2 person stand by assist.

## 2014-06-25 NOTE — ED Notes (Signed)
Pt reports bilateral rib pain, bilateral hip pain and pain across lumbar area. Pt reports pain with breath, breath sounds are clear and equal bilaterally. Pt denies any complaints other than pain. Pt denies any upper or mid back pain pt denies any neck pain or head injury.

## 2014-06-25 NOTE — ED Notes (Signed)
Pt requesting pain meds

## 2014-06-25 NOTE — ED Notes (Signed)
Unable to get TLSO brace at this point, Ac reaching out to [facility] to try to have brace sent here, MD aware pt maintained in bed at this point due to not having a brace.

## 2014-06-25 NOTE — ED Notes (Signed)
Bladder scan performed 526mL in bladder. Pt on bedpan repositioned to try to help her urinate

## 2014-06-25 NOTE — Consult Note (Signed)
Consult Note  [name] KDT:[id number] DOB: 25-Jun-1966 DOA: 06/25/2014  Referring physician: Dr. Wyvonnia Dusky, ED physician PCP: Glo Herring., MD   Chief Complaint: Back pain, L1 compression fracture, pain control  HPI: [name] is a 48 y.o. female  With history of fibromyalgia on Percocet 10/325 mg 3-4 times a day, COPD, hypertension, anxiety, depression, GERD.  She was seen in the emergency department after falling off a horse earlier today. She landed on her left side in a contorted position and felt a "pop".  She called her husband who came and assisted her to a standing position and helped her walk to the truck. She was then driven to the hospital for evaluation. She has focal back pain that started after the injury, which has continued. She has no radiation to the pain.  She denies weakness, numbness, difficulty urinating. Abdominal CT shows L1 compression fracture of about 20%. Movement makes her pain worse. Lying still improves her pain. Initially, the ED doctor had difficulty controlling the patient's pain and asked me to consult in attempts to control her pain.   Review of Systems:   Pt complains of severe back pain.  Pt denies any fevers, chills, nausea, vomiting, diarrhea, constipation, chest pain, shortness of breath, numbness, weakness..  Review of systems are otherwise negative  Past Medical History  Diagnosis Date  . Asthma   . COPD (chronic obstructive pulmonary disease)   . Hypertension   . GERD (gastroesophageal reflux disease)   . Anxiety disorder   . Chronic pain syndrome   . Anxiety   . Depression   . Fibromyalgia   . Epigastric pain 03/29/2013  . Fatigue 03/29/2013   Past Surgical History  Procedure Laterality Date  . Cholecystectomy    . Cesarean section    . Carpal tunnel release    . Esophagogastroduodenoscopy  07/31/2011    Procedure: ESOPHAGOGASTRODUODENOSCOPY (EGD);  Surgeon: Rogene Houston, MD;  [facility]: [facility];  Service:  Endoscopy;  Laterality: N/A;  Venia Minks dilation  07/31/2011    Procedure: Venia Minks DILATION;  Surgeon: Rogene Houston, MD;  Location: [facility];  Service: Endoscopy;;  . Endometrial ablation    . Mass excision  05/20/2012    Procedure: EXCISION MASS;  Surgeon: Sanjuana Kava, MD;  Location: [facility];  Service: Orthopedics;  Laterality: Left;  Repair of Fascial Defect Left Knee  . Carpal tunnel release  07/23/2012    Procedure: CARPAL TUNNEL RELEASE;  Surgeon: Sanjuana Kava, MD;  Location: [facility];  Service: Orthopedics;  Laterality: Left;  . Esophagogastroduodenoscopy (egd) with esophageal dilation N/A 09/30/2012    Procedure: ESOPHAGOGASTRODUODENOSCOPY (EGD) WITH ESOPHAGEAL DILATION;  Surgeon: Rogene Houston, MD;  [facility];  Service: Endoscopy;  Laterality: N/A;  245  . Knee surgery Left   . Esophagogastroduodenoscopy N/A 12/31/2013    Procedure: ESOPHAGOGASTRODUODENOSCOPY (EGD);  Surgeon: Rogene Houston, MD;  [facility];  Service: Endoscopy;  Laterality: N/A;  120-moved to 925 Ann to notify pt  . Balloon dilation N/A 12/31/2013    Procedure: BALLOON DILATION;  Surgeon: Rogene Houston, MD;  [facility];  Service: Endoscopy;  Laterality: N/A;  Venia Minks dilation N/A 12/31/2013    Procedure: Venia Minks DILATION;  Surgeon: Rogene Houston, MD;  [facility]: [facility];  Service: Endoscopy;  Laterality: N/A;  . Savory dilation N/A 12/31/2013    Procedure: SAVORY DILATION;  Surgeon: Rogene Houston, MD;  [facility];  Service: Endoscopy;  Laterality: N/A;   Social History:  reports that she quit smoking about 2 years ago. Her smoking use included Cigarettes. She smoked 0.00 packs per day. She has never used smokeless tobacco. She reports that she drinks about 1.2 oz of alcohol per week. She reports that she does not use illicit drugs. Patient lives at home & is able to participate in activities of daily living   No Known Allergies  Family History    Problem Relation Age of Onset  . Asthma Mother   . Asthma Father   . Cancer Father       Prior to Admission medications   Medication Sig Start Date End Date Taking? Authorizing Provider  albuterol (PROVENTIL HFA;VENTOLIN HFA) 108 (90 BASE) MCG/ACT inhaler Inhale 2 puffs into the lungs every 6 (six) hours as needed for wheezing or shortness of breath. For shortness of breath 07/31/11  Yes Kathie Dike, MD  albuterol (PROVENTIL) (2.5 MG/3ML) 0.083% nebulizer solution Take 2.5 mg by nebulization every 6 (six) hours as needed. For shortness of breath   Yes Historical Provider, MD  albuterol (PROVENTIL) 4 MG tablet Take 4 mg by mouth 3 (three) times daily.   Yes Historical Provider, MD  diazepam (VALIUM) 10 MG tablet Take 10 mg by mouth every 6 (six) hours as needed for anxiety.   Yes Historical Provider, MD  diltiazem (CARDIZEM CD) 240 MG 24 hr capsule Take 240 mg by mouth daily.   Yes Historical Provider, MD  fish oil-omega-3 fatty acids 1000 MG capsule Take 2 g by mouth daily.    Yes Historical Provider, MD  lisinopril (PRINIVIL,ZESTRIL) 20 MG tablet Take 1 tablet (20 mg total) by mouth daily. 07/31/11  Yes Kathie Dike, MD  oxyCODONE-acetaminophen (PERCOCET) 10-325 MG per tablet Take 1 tablet by mouth every 4 (four) hours as needed for pain.    Yes Historical Provider, MD  pantoprazole (PROTONIX) 40 MG tablet Take 1 tablet (40 mg total) by mouth 2 (two) times daily before a meal. 12/31/13  Yes Rogene Houston, MD  sucralfate (CARAFATE) 1 GM/10ML suspension Take 1 g by mouth 4 (four) times daily -  with meals and at bedtime.   Yes Historical Provider, MD    Physical Exam: BP 148/86 mmHg  Pulse 82  Temp(Src) 97.5 F (36.4 C) (Oral)  Resp 24  Ht 5\' 4"  (1.626 m)  Wt 72.576 kg (160 lb)  BMI 27.45 kg/m2  SpO2 99%  General: Middle-aged Caucasian female. Awake and alert and oriented x3. No acute cardiopulmonary distress.  Eyes: Pupils equal, round, reactive to light. Extraocular muscles  are intact. Sclerae anicteric and noninjected.  ENT: Moist mucosal membranes. No mucosal lesions.   Neck: Neck supple without lymphadenopathy. No carotid bruits. No masses palpated.  Cardiovascular: Regular rate with normal S1-S2 sounds. No murmurs, rubs, gallops auscultated. No JVD.  Respiratory: Good respiratory effort with no wheezes, rales, rhonchi. Lungs clear to auscultation bilaterally.  Abdomen: Soft, nontender, nondistended. Active bowel sounds. No masses or hepatosplenomegaly  Skin: Dry, warm to touch. 2+ dorsalis pedis and radial pulses. Musculoskeletal: No calf or leg pain. All major joints not erythematous nontender.  Psychiatric: Intact judgment and insight.  Neurologic: No focal neurological deficits. Cranial nerves II through XII are grossly intact. Slow purposeful gait           Labs on Admission:  Basic Metabolic Panel:  Recent Labs Lab 06/25/14 1520  NA 137  K 3.7  CL 97  CO2 20  GLUCOSE 126*  BUN  14  CREATININE 0.96  CALCIUM 9.2   Liver Function Tests:  Recent Labs Lab 06/25/14 1520  AST 29  ALT 29  ALKPHOS 113  BILITOT 0.3  PROT 7.7  ALBUMIN 4.1   No results for input(s): LIPASE, AMYLASE in the last 168 hours. No results for input(s): AMMONIA in the last 168 hours. CBC:  Recent Labs Lab 06/25/14 1520  WBC 8.7  NEUTROABS 5.6  HGB 13.0  HCT 39.0  MCV 96.1  PLT 279   Cardiac Enzymes: No results for input(s): CKTOTAL, CKMB, CKMBINDEX, TROPONINI in the last 168 hours.  BNP (last 3 results) No results for input(s): PROBNP in the last 8760 hours. CBG: No results for input(s): GLUCAP in the last 168 hours.  Radiological Exams on Admission: Ct Head Wo Contrast  06/25/2014   CLINICAL DATA:  48 year old female fell from horse with severe lower back pain and headache  EXAM: CT HEAD WITHOUT CONTRAST  CT CERVICAL SPINE WITHOUT CONTRAST  TECHNIQUE: Multidetector CT imaging of the head and cervical spine was performed following the standard  protocol without intravenous contrast. Multiplanar CT image reconstructions of the cervical spine were also generated.  COMPARISON:  Prior MRI of the cervical spine 01/01/2010; CT of the head and cervical spine 10/25/2008  FINDINGS: CT HEAD FINDINGS  Negative for acute intracranial hemorrhage, acute infarction, mass, mass effect, hydrocephalus or midline shift. Gray-white differentiation is preserved throughout. Globes and orbits are intact and unremarkable. No focal calvarial or soft tissue abnormality. Normal aeration of the mastoid air cells and visualized paranasal sinuses.  CT CERVICAL SPINE FINDINGS  No acute fracture, malalignment or prevertebral soft tissue swelling. Cervical spondylosis with posterior disc osteophyte complex at C5-C6 which results in some narrowing of the central canal. This is been seen on prior imaging. Unremarkable CT appearance of the thyroid gland. No acute soft tissue abnormality. The lung apices are unremarkable.  IMPRESSION: CT HEAD  1. Negative CT CSPINE  1. No acute fracture or malalignment. 2. C5-C6 degenerative disc disease.   Electronically Signed   By: Jacqulynn Cadet M.D.   On: 06/25/2014 16:19   Ct Chest Wo Contrast  06/25/2014   CLINICAL DATA:  Fall from horse today with low back pain and question fractured ribs.  EXAM: CT CHEST WITHOUT CONTRAST  TECHNIQUE: Multidetector CT imaging of the chest was performed following the standard protocol without IV contrast.  COMPARISON:  02/17/2014 and 10/25/2008  FINDINGS: Lungs are adequately inflated without consolidation or effusion. There is no evidence of pneumothorax. Airways are normal. Heart and remaining mediastinal structures are within normal.  Images through the upper abdomen demonstrate evidence of prior cholecystectomy and are otherwise unremarkable. There is an acute moderate L1 compression fracture. Mild retropulsion of the posterior border of the fracture.  IMPRESSION: No acute findings within the thorax.  Again  seen is patient's acute moderate L1 compression fracture with mild retropulsion of the posterior border of the fracture.   Electronically Signed   By: Marin Olp M.D.   On: 06/25/2014 18:11   Ct Cervical Spine Wo Contrast  06/25/2014   CLINICAL DATA:  48 year old female fell from horse with severe lower back pain and headache  EXAM: CT HEAD WITHOUT CONTRAST  CT CERVICAL SPINE WITHOUT CONTRAST  TECHNIQUE: Multidetector CT imaging of the head and cervical spine was performed following the standard protocol without intravenous contrast. Multiplanar CT image reconstructions of the cervical spine were also generated.  COMPARISON:  Prior MRI of the cervical spine 01/01/2010; CT of  the head and cervical spine 10/25/2008  FINDINGS: CT HEAD FINDINGS  Negative for acute intracranial hemorrhage, acute infarction, mass, mass effect, hydrocephalus or midline shift. Gray-white differentiation is preserved throughout. Globes and orbits are intact and unremarkable. No focal calvarial or soft tissue abnormality. Normal aeration of the mastoid air cells and visualized paranasal sinuses.  CT CERVICAL SPINE FINDINGS  No acute fracture, malalignment or prevertebral soft tissue swelling. Cervical spondylosis with posterior disc osteophyte complex at C5-C6 which results in some narrowing of the central canal. This is been seen on prior imaging. Unremarkable CT appearance of the thyroid gland. No acute soft tissue abnormality. The lung apices are unremarkable.  IMPRESSION: CT HEAD  1. Negative CT CSPINE  1. No acute fracture or malalignment. 2. C5-C6 degenerative disc disease.   Electronically Signed   By: Jacqulynn Cadet M.D.   On: 06/25/2014 16:19   Ct Abdomen Pelvis W Contrast  06/25/2014   CLINICAL DATA:  Fall from horse today with low back pain and headaches, initial encounter  EXAM: CT ABDOMEN AND PELVIS WITH CONTRAST  TECHNIQUE: Multidetector CT imaging of the abdomen and pelvis was performed using the standard protocol  following bolus administration of intravenous contrast.  CONTRAST:  156mL OMNIPAQUE IOHEXOL 300 MG/ML  SOLN  COMPARISON:  04/27/2013  FINDINGS: The lung bases are well aerated without focal infiltrate or sizable effusion. No pneumothorax is noted.  The liver is diffusely decreased in attenuation consistent with fatty infiltration. The gallbladder has been surgically removed. The spleen, adrenal glands and pancreas are within normal limits. Kidneys are well visualized and show no enhancement abnormality. Delayed images demonstrate normal excretion of contrast material. No visceral injury is seen.  The appendix is within normal limits. No free air is seen. The bladder is well distended. Delayed images through the bladder demonstrate normal contrast opacification. Fluid is noted within the endometrial canal. The previously seen ovarian cysts have resolved in the interval. No free pelvic fluid is seen. The bony structures show changes consistent with an L1 compression deformity.Approximately 20% vertebral body height loss is noted. Mild retropulsion is seen.  IMPRESSION: L1 compression deformity.  No other focal abnormality is seen.   Electronically Signed   By: Inez Catalina M.D.   On: 06/25/2014 16:24   Dg Chest Portable 1 View  06/25/2014   CLINICAL DATA:  Patient complaining of left-sided chest pain as well as back pain after being thrown from a horse today. History of asthma and COPD. Former smoker.  EXAM: PORTABLE CHEST - 1 VIEW  COMPARISON:  02/22/2014  FINDINGS: The heart size and mediastinal contours are within normal limits. Both lungs are clear. The bony thorax is grossly intact.  IMPRESSION: No active disease.   Electronically Signed   By: Lajean Manes M.D.   On: 06/25/2014 15:42     Assessment/Plan #1 L1 compression fracture status post fall  #2 opiate dependence/tolerance  Due to the patient's opiate tolerance, her pain control will likely be difficult however the patient is able to ambulate  with a walker.  I believe that the patient's pain can be controlled with 10mg  of oxycodone every 3-4 hours. On top of this, I would recommend giving her ibuprofen 600 mg every 6 hours along with a PPI for gastric protection. Additionally, she is still to be fit with a TLSO brace, which should provide additional comfort. After discussing this with the patient, the patient is amenable to going home. I would encourage her to follow-up with her primary care  physician at the soonest available.  Thank you for allowing me to participate in the care of this patient.  Time spent: 60 min  [place]oma [place], [place] [facility] Pager [phone]

## 2014-06-25 NOTE — ED Notes (Signed)
Technician here to fit pt with her TLSO brace.

## 2014-07-01 ENCOUNTER — Emergency Department (HOSPITAL_COMMUNITY): Payer: Medicaid Other

## 2014-07-01 ENCOUNTER — Emergency Department (HOSPITAL_COMMUNITY)
Admission: EM | Admit: 2014-07-01 | Discharge: 2014-07-01 | Disposition: A | Discharge status: Home / Self Care | Payer: Medicaid Other | Attending: Emergency Medicine | Admitting: Emergency Medicine

## 2014-07-01 ENCOUNTER — Encounter (HOSPITAL_COMMUNITY): Payer: Self-pay | Admitting: Cardiology

## 2014-07-01 DIAGNOSIS — J441 Chronic obstructive pulmonary disease with (acute) exacerbation: Secondary | ICD-10-CM | POA: Diagnosis not present

## 2014-07-01 DIAGNOSIS — W19XXXA Unspecified fall, initial encounter: Secondary | ICD-10-CM | POA: Insufficient documentation

## 2014-07-01 DIAGNOSIS — F329 Major depressive disorder, single episode, unspecified: Secondary | ICD-10-CM | POA: Diagnosis not present

## 2014-07-01 DIAGNOSIS — M797 Fibromyalgia: Secondary | ICD-10-CM | POA: Insufficient documentation

## 2014-07-01 DIAGNOSIS — Z87891 Personal history of nicotine dependence: Secondary | ICD-10-CM | POA: Insufficient documentation

## 2014-07-01 DIAGNOSIS — G894 Chronic pain syndrome: Secondary | ICD-10-CM | POA: Diagnosis not present

## 2014-07-01 DIAGNOSIS — F419 Anxiety disorder, unspecified: Secondary | ICD-10-CM | POA: Diagnosis not present

## 2014-07-01 DIAGNOSIS — I10 Essential (primary) hypertension: Secondary | ICD-10-CM | POA: Diagnosis not present

## 2014-07-01 DIAGNOSIS — R062 Wheezing: Secondary | ICD-10-CM

## 2014-07-01 DIAGNOSIS — K5900 Constipation, unspecified: Secondary | ICD-10-CM | POA: Diagnosis present

## 2014-07-01 DIAGNOSIS — K219 Gastro-esophageal reflux disease without esophagitis: Secondary | ICD-10-CM | POA: Insufficient documentation

## 2014-07-01 DIAGNOSIS — Z79899 Other long term (current) drug therapy: Secondary | ICD-10-CM | POA: Insufficient documentation

## 2014-07-01 MED ORDER — KETOROLAC TROMETHAMINE 30 MG/ML IJ SOLN
30.0000 mg | Freq: Once | INTRAMUSCULAR | Status: AC
  Administered 2014-07-01: 30 mg via INTRAVENOUS
  Filled 2014-07-01: qty 1

## 2014-07-01 MED ORDER — SODIUM CHLORIDE 0.9 % IV BOLUS (SEPSIS)
1000.0000 mL | Freq: Once | INTRAVENOUS | Status: AC
  Administered 2014-07-01: 1000 mL via INTRAVENOUS

## 2014-07-01 MED ORDER — PEG 3350-KCL-NABCB-NACL-NASULF 236 G PO SOLR: 4000.0000 mL | Freq: Once | ORAL | Status: DC

## 2014-07-01 MED ORDER — MAGNESIUM CITRATE PO SOLN
1.0000 | Freq: Once | ORAL | Status: AC
  Administered 2014-07-01: 1 via ORAL
  Filled 2014-07-01: qty 296

## 2014-07-01 MED ORDER — SORBITOL 70 % SOLN: 960.0000 mL | TOPICAL_OIL | Freq: Once | ORAL | Status: DC

## 2014-07-01 MED ORDER — MINERAL OIL RE ENEM
1.0000 | ENEMA | Freq: Once | RECTAL | Status: AC
  Administered 2014-07-01: 1 via RECTAL
  Filled 2014-07-01: qty 1

## 2014-07-01 NOTE — ED Notes (Addendum)
No BM times 7 days.  Seen here last week after being thrown from a horse. Fractured her back.    Taking pain medicine.

## 2014-07-01 NOTE — Discharge Instructions (Signed)
You may use MiraLAX with a large glass of water twice a day, Colace tablets 100 mg twice a day, Metamucil twice a day. You may also use Fleet enemas, magnesium citrate and glycerin suppositories as needed. All of these medications are found over-the-counter. We're also getting a prescription for GoLYTELY that she may use if you do not have a bowel movement in the next 48 hours. She'll be in having worsening pain, abdominal distention, cannot pass gas, begin vomiting or have fever, please return to the hospital.   Constipation Constipation is when a person has fewer than three bowel movements a week, has difficulty having a bowel movement, or has stools that are dry, hard, or larger than normal. As people grow older, constipation is more common. If you try to fix constipation with medicines that make you have a bowel movement (laxatives), the problem may get worse. Long-term laxative use may cause the muscles of the colon to become weak. A low-fiber diet, not taking in enough fluids, and taking certain medicines may make constipation worse.  CAUSES   Certain medicines, such as antidepressants, pain medicine, iron supplements, antacids, and water pills.   Certain diseases, such as diabetes, irritable bowel syndrome (IBS), thyroid disease, or depression.   Not drinking enough water.   Not eating enough fiber-rich foods.   Stress or travel.   Lack of physical activity or exercise.   Ignoring the urge to have a bowel movement.   Using laxatives too much.  SIGNS AND SYMPTOMS   Having fewer than three bowel movements a week.   Straining to have a bowel movement.   Having stools that are hard, dry, or larger than normal.   Feeling full or bloated.   Pain in the lower abdomen.   Not feeling relief after having a bowel movement.  DIAGNOSIS  Your health care provider will take a medical history and perform a physical exam. Further testing may be done for severe constipation.  Some tests may include:  A barium enema X-ray to examine your rectum, colon, and, sometimes, your small intestine.   A sigmoidoscopy to examine your lower colon.   A colonoscopy to examine your entire colon. TREATMENT  Treatment will depend on the severity of your constipation and what is causing it. Some dietary treatments include drinking more fluids and eating more fiber-rich foods. Lifestyle treatments may include regular exercise. If these diet and lifestyle recommendations do not help, your health care provider may recommend taking over-the-counter laxative medicines to help you have bowel movements. Prescription medicines may be prescribed if over-the-counter medicines do not work.  HOME CARE INSTRUCTIONS   Eat foods that have a lot of fiber, such as fruits, vegetables, whole grains, and beans.  Limit foods high in fat and processed sugars, such as french fries, hamburgers, cookies, candies, and soda.   A fiber supplement may be added to your diet if you cannot get enough fiber from foods.   Drink enough fluids to keep your urine clear or pale yellow.   Exercise regularly or as directed by your health care provider.   Go to the restroom when you have the urge to go. Do not hold it.   Only take over-the-counter or prescription medicines as directed by your health care provider. Do not take other medicines for constipation without talking to your health care provider first.  [place] IF:   You have bright red blood in your stool.   Your constipation lasts for more than 4  days or gets worse.   You have abdominal or rectal pain.   You have thin, pencil-like stools.   You have unexplained weight loss. MAKE SURE YOU:   Understand these instructions.  Will watch your condition.  Will get help right away if you are not doing well or get worse. Document Released: 03/29/2004 Document Revised: 07/06/2013 Document Reviewed: 04/12/2013 [facility]  Patient Information 2015 [place], [place]. This information is not intended to replace advice given to you by your health care provider. Make sure you discuss any questions you have with your health care provider. High-Fiber Diet Fiber is found in fruits, vegetables, and grains. A high-fiber diet encourages the addition of more whole grains, legumes, fruits, and vegetables in your diet. The recommended amount of fiber for adult males is 38 g per day. For adult females, it is 25 g per day. Pregnant and lactating women should get 28 g of fiber per day. If you have a digestive or bowel problem, ask your caregiver for advice before adding high-fiber foods to your diet. Eat a variety of high-fiber foods instead of only a select few type of foods.  PURPOSE  To increase stool bulk.  To make bowel movements more regular to prevent constipation.  To lower cholesterol.  To prevent overeating. WHEN IS THIS DIET USED?  It may be used if you have constipation and hemorrhoids.  It may be used if you have uncomplicated diverticulosis (intestine condition) and irritable bowel syndrome.  It may be used if you need help with weight management.  It may be used if you want to add it to your diet as a protective measure against atherosclerosis, diabetes, and cancer. SOURCES OF FIBER  Whole-grain breads and cereals.  Fruits, such as apples, oranges, bananas, berries, prunes, and pears.  Vegetables, such as green peas, carrots, sweet potatoes, beets, broccoli, cabbage, spinach, and artichokes.  Legumes, such split peas, soy, lentils.  Almonds. FIBER CONTENT IN FOODS Starches and Grains / Dietary Fiber (g)  Cheerios, 1 cup / 3 g  Corn Flakes cereal, 1 cup / 0.7 g  Rice crispy treat cereal, 1 cup / 0.3 g  Instant oatmeal (cooked),  cup / 2 g  Frosted wheat cereal, 1 cup / 5.1 g  Brown, long-grain rice (cooked), 1 cup / 3.5 g  White, long-grain rice (cooked), 1 cup / 0.6 g  Enriched macaroni  (cooked), 1 cup / 2.5 g Legumes / Dietary Fiber (g)  Baked beans (canned, plain, or vegetarian),  cup / 5.2 g  Kidney beans (canned),  cup / 6.8 g  Pinto beans (cooked),  cup / 5.5 g Breads and Crackers / Dietary Fiber (g)  Plain or honey graham crackers, 2 squares / 0.7 g  Saltine crackers, 3 squares / 0.3 g  Plain, salted pretzels, 10 pieces / 1.8 g  Whole-wheat bread, 1 slice / 1.9 g  White bread, 1 slice / 0.7 g  Raisin bread, 1 slice / 1.2 g  Plain bagel, 3 oz / 2 g  Flour tortilla, 1 oz / 0.9 g  Corn tortilla, 1 small / 1.5 g  Hamburger or hotdog bun, 1 small / 0.9 g Fruits / Dietary Fiber (g)  Apple with skin, 1 medium / 4.4 g  Sweetened applesauce,  cup / 1.5 g  Banana,  medium / 1.5 g  Grapes, 10 grapes / 0.4 g  Orange, 1 small / 2.3 g  Raisin, 1.5 oz / 1.6 g  Melon, 1 cup / 1.4 g  Vegetables / Dietary Fiber (g)  Green beans (canned),  cup / 1.3 g  Carrots (cooked),  cup / 2.3 g  Broccoli (cooked),  cup / 2.8 g  Peas (cooked),  cup / 4.4 g  Mashed potatoes,  cup / 1.6 g  Lettuce, 1 cup / 0.5 g  Corn (canned),  cup / 1.6 g  Tomato,  cup / 1.1 g Document Released: 07/01/2005 Document Revised: 12/31/2011 Document Reviewed: 10/03/2011 [employer] Patient Information 2015 [place], [place]. This information is not intended to replace advice given to you by your health care provider. Make sure you discuss any questions you have with your health care provider.

## 2014-07-01 NOTE — ED Provider Notes (Signed)
This chart was scribed for [place], DO by Jeanell Sparrow, ED Scribe. This patient was seen in room [facility] and the patient's care was started at 5:55 PM.  TIME SEEN: 5:55 PM  CHIEF COMPLAINT: Constipation  HPI:  HPI Comments: [name] is a 48 y.o. female with history of COPD, hypertension, fibromyalgia who presents to the Emergency Department complaining of constant moderate constipation that has been going on for about 7 days. She was seen in the ED on 06/25/14 after she was thrown off a horse and was found to have an L1 compression fracture. At that time she had CT of her head, cervical spine, chest, abdomen and pelvis. No other injury noted. She has been on narcotics. She states that she has had no BMs for about 7 days. She states that she used laxatives, stool softeners, suppositories, and enema without any relief. She states that she is still able to pass gas. She reports associated cramps. She denies any nausea or emesis.   ROS: See HPI Constitutional: no fever  Eyes: no drainage  ENT: no runny nose   Cardiovascular:  no chest pain  Resp: no SOB  GI: abdominal cramps, no vomiting, no nausea GU: constipation, no dysuria Integumentary: no rash  Allergy: no hives  Musculoskeletal: no leg swelling  Neurological: no slurred speech ROS otherwise negative  PAST MEDICAL HISTORY/PAST SURGICAL HISTORY:  Past Medical History  Diagnosis Date  . Asthma   . COPD (chronic obstructive pulmonary disease)   . Hypertension   . GERD (gastroesophageal reflux disease)   . Anxiety disorder   . Chronic pain syndrome   . Anxiety   . Depression   . Fibromyalgia   . Epigastric pain 03/29/2013  . Fatigue 03/29/2013    MEDICATIONS:  Prior to Admission medications   Medication Sig Start Date End Date Taking? Authorizing Provider  albuterol (PROVENTIL HFA;VENTOLIN HFA) 108 (90 BASE) MCG/ACT inhaler Inhale 2 puffs into the lungs every 6 (six) hours as needed for wheezing or shortness  of breath. For shortness of breath 07/31/11   Kathie Dike, MD  albuterol (PROVENTIL) (2.5 MG/3ML) 0.083% nebulizer solution Take 2.5 mg by nebulization every 6 (six) hours as needed. For shortness of breath    Historical Provider, MD  albuterol (PROVENTIL) 4 MG tablet Take 4 mg by mouth 3 (three) times daily.    Historical Provider, MD  diazepam (VALIUM) 10 MG tablet Take 10 mg by mouth every 6 (six) hours as needed for anxiety.    Historical Provider, MD  diltiazem (CARDIZEM CD) 240 MG 24 hr capsule Take 240 mg by mouth daily.    Historical Provider, MD  fish oil-omega-3 fatty acids 1000 MG capsule Take 2 g by mouth daily.     Historical Provider, MD  lisinopril (PRINIVIL,ZESTRIL) 20 MG tablet Take 1 tablet (20 mg total) by mouth daily. 07/31/11   Kathie Dike, MD  omeprazole (PRILOSEC) 20 MG capsule Take 1 capsule (20 mg total) by mouth daily. 06/25/14   Ezequiel Essex, MD  oxyCODONE-acetaminophen (PERCOCET) 10-325 MG per tablet Take 1 tablet by mouth every 4 (four) hours as needed for pain.     Historical Provider, MD  pantoprazole (PROTONIX) 40 MG tablet Take 1 tablet (40 mg total) by mouth 2 (two) times daily before a meal. 12/31/13   Rogene Houston, MD  sucralfate (CARAFATE) 1 GM/10ML suspension Take 1 g by mouth 4 (four) times daily -  with meals and at bedtime.    Historical Provider,  MD    ALLERGIES:  No Known Allergies  SOCIAL HISTORY:  History  Substance Use Topics  . Smoking status: Former Smoker    Types: Cigarettes    Quit date: 10/26/2011  . Smokeless tobacco: Never Used  . Alcohol Use: 1.2 oz/week    2 Cans of beer per week     Comment: social    FAMILY HISTORY: Family History  Problem Relation Age of Onset  . Asthma Mother   . Asthma Father   . Cancer Father     EXAM: BP 115/69 mmHg  Pulse 75  Temp(Src) 97.6 F (36.4 C) (Oral)  Resp 16  SpO2 98% CONSTITUTIONAL: Alert and oriented and responds appropriately to questions. Well-appearing;  well-nourished HEAD: Normocephalic EYES: Conjunctivae clear, PERRL ENT: normal nose; no rhinorrhea; moist mucous membranes; pharynx without lesions noted NECK: Supple, no meningismus, no LAD  CARD: RRR; S1 and S2 appreciated; no murmurs, no clicks, no rubs, no gallops RESP: Normal chest excursion without splinting or tachypnea; breath sounds equal bilaterally;  no rhonchi, no rales, diffuse expiratory wheezing  ABD/GI: Normal bowel sounds; non-distended; soft, mildly TTP diffusely, no rebound, no guarding, no peritoneal signs RECTAL: normal rectal tone with no gross blood or melana. Small amount of hard stool in rectal vault, but no fecal impaction  BACK:  The back appears normal and is non-tender to palpation, there is no CVA tenderness EXT: Normal ROM in all joints; non-tender to palpation; no edema; normal capillary refill; no cyanosis    SKIN: Normal color for age and race; warm NEURO: Moves all extremities equally, sensation to light touch intact diffusely  PSYCH: The patient's mood and manner are appropriate. Grooming and personal hygiene are appropriate.  MEDICAL DECISION MAKING: Patient here with constipation likely secondary from narcotic use. She is diffusely tender but has a nonsurgical abdomen. Minimal amount of stool in the rectal vault but no fecal impaction. We'll give IV fluids, magnesium citrate and Fleet enema. Will obtain acute abdominal series that I have lower suspicion for small bowel obstruction. We'll give Toradol for pain in the ED she does appear very uncomfortable. Have advised her that we should avoid narcotics if possible as this will likely worsen her symptoms and she agrees with this plan.  ED PROGRESS: Patient's x-ray shows moderate amount of stool in the ascending colon but no sign of free air or small bowel obstruction. Patient was able to have a small bowel movement in the ED reports some improvement in pain. She still reports feeling uncomfortable but is fine with  plan for discharge home. Have advised her to begin using MiraLAX, Colace and Metamucil every day and that she can use suppositories, enemas, magnesium citrate as needed. Will also give prescription for GoLYTELY if symptoms do not improve in 48 hours. Have discussed return precautions. She verbalized understanding and is comfortable with plan.     I personally performed the services described in this documentation, which was scribed in my presence. The recorded information has been reviewed and is accurate.     [place], DO 07/01/14 2004

## 2014-07-01 NOTE — ED Notes (Signed)
Pt alert & oriented x4, stable gait. Patient given discharge instructions, paperwork & prescription(s). Patient  instructed to stop at the registration desk to finish any additional paperwork. Patient verbalized understanding. Pt left department w/ no further questions. 

## 2014-08-03 ENCOUNTER — Other Ambulatory Visit (INDEPENDENT_AMBULATORY_CARE_PROVIDER_SITE_OTHER): Payer: Self-pay | Admitting: Internal Medicine

## 2014-08-14 ENCOUNTER — Emergency Department (HOSPITAL_COMMUNITY): Payer: Medicaid Other

## 2014-08-14 ENCOUNTER — Encounter (HOSPITAL_COMMUNITY): Payer: Self-pay | Admitting: Cardiology

## 2014-08-14 ENCOUNTER — Emergency Department (HOSPITAL_COMMUNITY)
Admission: EM | Admit: 2014-08-14 | Discharge: 2014-08-14 | Disposition: A | Discharge status: Home / Self Care | Payer: Medicaid Other | Attending: Emergency Medicine | Admitting: Emergency Medicine

## 2014-08-14 DIAGNOSIS — I10 Essential (primary) hypertension: Secondary | ICD-10-CM | POA: Diagnosis not present

## 2014-08-14 DIAGNOSIS — K219 Gastro-esophageal reflux disease without esophagitis: Secondary | ICD-10-CM | POA: Diagnosis not present

## 2014-08-14 DIAGNOSIS — Z87891 Personal history of nicotine dependence: Secondary | ICD-10-CM | POA: Insufficient documentation

## 2014-08-14 DIAGNOSIS — J441 Chronic obstructive pulmonary disease with (acute) exacerbation: Secondary | ICD-10-CM | POA: Diagnosis not present

## 2014-08-14 DIAGNOSIS — S32010G Wedge compression fracture of first lumbar vertebra, subsequent encounter for fracture with delayed healing: Secondary | ICD-10-CM | POA: Diagnosis not present

## 2014-08-14 DIAGNOSIS — F419 Anxiety disorder, unspecified: Secondary | ICD-10-CM | POA: Insufficient documentation

## 2014-08-14 DIAGNOSIS — G894 Chronic pain syndrome: Secondary | ICD-10-CM | POA: Insufficient documentation

## 2014-08-14 DIAGNOSIS — M797 Fibromyalgia: Secondary | ICD-10-CM | POA: Diagnosis not present

## 2014-08-14 DIAGNOSIS — M545 Low back pain: Secondary | ICD-10-CM | POA: Diagnosis present

## 2014-08-14 DIAGNOSIS — Z79899 Other long term (current) drug therapy: Secondary | ICD-10-CM | POA: Diagnosis not present

## 2014-08-14 LAB — URINALYSIS, ROUTINE W REFLEX MICROSCOPIC
Bilirubin Urine: NEGATIVE
Glucose, UA: NEGATIVE mg/dL
Hgb urine dipstick: NEGATIVE
KETONES UR: 15 mg/dL — AB
Leukocytes, UA: NEGATIVE
NITRITE: NEGATIVE
Protein, ur: NEGATIVE mg/dL
SPECIFIC GRAVITY, URINE: 1.022 (ref 1.005–1.030)
Urobilinogen, UA: 0.2 mg/dL (ref 0.0–1.0)
pH: 5.5 (ref 5.0–8.0)

## 2014-08-14 LAB — COMPREHENSIVE METABOLIC PANEL
ALT: 27 U/L (ref 0–35)
AST: 26 U/L (ref 0–37)
Albumin: 3.8 g/dL (ref 3.5–5.2)
Alkaline Phosphatase: 101 U/L (ref 39–117)
Anion gap: 10 (ref 5–15)
BUN: 8 mg/dL (ref 6–23)
CO2: 26 mmol/L (ref 19–32)
Calcium: 9.4 mg/dL (ref 8.4–10.5)
Chloride: 102 mmol/L (ref 96–112)
Creatinine, Ser: 0.72 mg/dL (ref 0.50–1.10)
GFR calc Af Amer: >90 mL/min (ref >90)
GFR calc non Af Amer: >90 mL/min (ref >90)
GLUCOSE: 157 mg/dL — AB (ref 70–99)
Potassium: 3.6 mmol/L (ref 3.5–5.1)
Sodium: 138 mmol/L (ref 135–145)
Total Bilirubin: 0.4 mg/dL (ref 0.3–1.2)
Total Protein: 7.2 g/dL (ref 6.0–8.3)

## 2014-08-14 LAB — CBC WITH DIFFERENTIAL/PLATELET
Basophils Absolute: 0.1 10*3/uL (ref 0.0–0.1)
Basophils Relative: 1 % (ref 0–1)
EOS PCT: 3 % (ref 0–5)
Eosinophils Absolute: 0.2 10*3/uL (ref 0.0–0.7)
HEMATOCRIT: 40.1 % (ref 36.0–46.0)
Hemoglobin: 13.2 g/dL (ref 12.0–15.0)
LYMPHS ABS: 1.7 10*3/uL (ref 0.7–4.0)
Lymphocytes Relative: 33 % (ref 12–46)
MCH: 31.7 pg (ref 26.0–34.0)
MCHC: 32.9 g/dL (ref 30.0–36.0)
MCV: 96.2 fL (ref 78.0–100.0)
MONO ABS: 0.4 10*3/uL (ref 0.1–1.0)
Monocytes Relative: 8 % (ref 3–12)
NEUTROS ABS: 2.9 10*3/uL (ref 1.7–7.7)
Neutrophils Relative %: 55 % (ref 43–77)
Platelets: 240 10*3/uL (ref 150–400)
RBC: 4.17 MIL/uL (ref 3.87–5.11)
RDW: 12.2 % (ref 11.5–15.5)
WBC: 5.2 10*3/uL (ref 4.0–10.5)

## 2014-08-14 MED ORDER — ONDANSETRON HCL 4 MG/2ML IJ SOLN
4.0000 mg | Freq: Once | INTRAMUSCULAR | Status: AC
  Administered 2014-08-14: 4 mg via INTRAVENOUS
  Filled 2014-08-14: qty 2

## 2014-08-14 MED ORDER — HYDROMORPHONE HCL 1 MG/ML IJ SOLN
1.0000 mg | Freq: Once | INTRAMUSCULAR | Status: AC
  Administered 2014-08-14: 1 mg via INTRAVENOUS
  Filled 2014-08-14: qty 1

## 2014-08-14 MED ORDER — CHLORZOXAZONE 500 MG PO TABS: 500.0000 mg | ORAL_TABLET | Freq: Three times a day (TID) | ORAL | Status: DC | PRN

## 2014-08-14 MED ORDER — CYCLOBENZAPRINE HCL 10 MG PO TABS: 10.0000 mg | ORAL_TABLET | Freq: Three times a day (TID) | ORAL | Status: DC | PRN

## 2014-08-14 NOTE — Discharge Instructions (Signed)
Back, Compression Fracture °A compression fracture happens when a force is put upon the length of your spine. Slipping and falling on your bottom are examples of such a force. When this happens, sometimes the force is great enough to compress the building blocks (vertebral bodies) of your spine. Although this causes a lot of pain, this can usually be treated at home, unless your caregiver feels hospitalization is needed for pain control. °Your backbone (spinal column) is made up of 24 main vertebral bodies in addition to the sacrum and coccyx (see illustration). These are held together by tough fibrous tissues (ligaments) and by support of your muscles. Nerve roots pass through the openings between the vertebrae. A sudden wrenching move, injury, or a fall may cause a compression fracture of one of the vertebral bodies. This may result in back pain or spread of pain into the belly (abdomen), the buttocks, and down the leg into the foot. Pain may also be created by muscle spasm alone. °Large studies have been undertaken to determine the best possible course of action to help your back following injury and also to prevent future problems. The recommendations are as follows. °FOLLOWING A COMPRESSION FRACTURE: °Do the following only if advised by your caregiver.  °· If a back brace has been suggested or provided, wear it as directed. °· Do not stop wearing the back brace unless instructed by your caregiver. °· When allowed to return to regular activities, avoid a sedentary lifestyle. Actively exercise. Sporadic weekend binges of tennis, racquetball, or waterskiing may actually aggravate or create problems, especially if you are not in condition for that activity. °· Avoid sports requiring sudden body movements until you are in condition for them. Swimming and walking are safer activities. °· Maintain good posture. °· Avoid obesity. °· If not already done, you should have a DEXA scan. Based on the results, be treated for  osteoporosis. °FOLLOWING ACUTE (SUDDEN) INJURY: °· Only take over-the-counter or prescription medicines for pain, discomfort, or fever as directed by your caregiver. °· Use bed rest for only the most extreme acute episode. Prolonged bed rest may aggravate your condition. Ice used for acute conditions is effective. Use a large plastic bag filled with ice. Wrap it in a towel. This also provides excellent pain relief. This may be continuous. Or use it for 30 minutes every 2 hours during acute phase, then as needed. Heat for 30 minutes prior to activities is helpful. °· As soon as the acute phase (the time when your back is too painful for you to do normal activities) is over, it is important to resume normal activities and work hardening programs. Back injuries can cause potentially marked changes in lifestyle. So it is important to attack these problems aggressively. °· See your caregiver for continued problems. He or she can help or refer you for appropriate exercises, physical therapy, and work hardening if needed. °· If you are given narcotic medications for your condition, for the next 24 hours do not: °¨ Drive. °¨ Operate machinery or power tools. °¨ Sign legal documents. °· Do not drink alcohol, or take sleeping pills or other medications that may interfere with treatment. °If your caregiver has given you a follow-up appointment, it is very important to keep that appointment. Not keeping the appointment could result in a chronic or permanent injury, pain, and disability. If there is any problem keeping the appointment, you must call back to this facility for assistance.  °SEEK IMMEDIATE MEDICAL CARE IF: °· You develop numbness,   tingling, weakness, or problems with the use of your arms or legs. °· You develop severe back pain not relieved with medications. °· You have changes in bowel or bladder control. °· You have increasing pain in any areas of the body. °Document Released: 07/01/2005 Document Revised:  11/15/2013 Document Reviewed: 02/03/2008 °Exit[employer]® Patient Information ©2015 [employer]. This information is not intended to replace advice given to you by your health care provider. Make sure you discuss any questions you have with your health care provider. ° °

## 2014-08-14 NOTE — ED Notes (Signed)
Pt reports she was bucked off a horse before christmas and cracked her spine. States she has been continuing to have lower back pain and her abd seems to be swelling. Denies any abd pain or n/v.

## 2014-08-14 NOTE — ED Notes (Signed)
Pt A&OX4, ambulatory at d/c with steady gait, NAD and states her sister will be driving her home.

## 2014-08-18 NOTE — ED Provider Notes (Signed)
CSN: [id number]     Arrival date & time 08/14/14  1309 History   First MD Initiated Contact with Patient 08/14/14 1601     Chief Complaint  Patient presents with  . Back Pain  . Flank Pain      HPI  Expand All Collapse All   Pt reports she was bucked off a horse before christmas and cracked her spine. States she has been continuing to have lower back pain and her abd seems to be swelling. Denies any abd pain or n/v.       Past Medical History  Diagnosis Date  . Asthma   . COPD (chronic obstructive pulmonary disease)   . Hypertension   . GERD (gastroesophageal reflux disease)   . Anxiety disorder   . Chronic pain syndrome   . Anxiety   . Depression   . Fibromyalgia   . Epigastric pain 03/29/2013  . Fatigue 03/29/2013   Past Surgical History  Procedure Laterality Date  . Cholecystectomy    . Cesarean section    . Carpal tunnel release    . Esophagogastroduodenoscopy  07/31/2011    Procedure: ESOPHAGOGASTRODUODENOSCOPY (EGD);  Surgeon: Rogene Houston, MD;  Location: [facility];  Service: Endoscopy;  Laterality: N/A;  Venia Minks dilation  07/31/2011    Procedure: Venia Minks DILATION;  Surgeon: Rogene Houston, MD;  Location: [facility];  Service: Endoscopy;;  . Endometrial ablation    . Mass excision  05/20/2012    Procedure: EXCISION MASS;  Surgeon: Sanjuana Kava, MD;  Location: [facility];  Service: Orthopedics;  Laterality: Left;  Repair of Fascial Defect Left Knee  . Carpal tunnel release  07/23/2012    Procedure: CARPAL TUNNEL RELEASE;  Surgeon: Sanjuana Kava, MD;  Location: [facility];  Service: Orthopedics;  Laterality: Left;  . Esophagogastroduodenoscopy (egd) with esophageal dilation N/A 09/30/2012    Procedure: ESOPHAGOGASTRODUODENOSCOPY (EGD) WITH ESOPHAGEAL DILATION;  Surgeon: Rogene Houston, MD;  Location: [facility];  Service: Endoscopy;  Laterality: N/A;  245  . Knee surgery Left   . Esophagogastroduodenoscopy N/A 12/31/2013    Procedure: ESOPHAGOGASTRODUODENOSCOPY  (EGD);  Surgeon: Rogene Houston, MD;  Location: [facility];  Service: Endoscopy;  Laterality: N/A;  120-moved to 925 Ann to notify pt  . Balloon dilation N/A 12/31/2013    Procedure: BALLOON DILATION;  Surgeon: Rogene Houston, MD;  Location: [facility];  Service: Endoscopy;  Laterality: N/A;  Venia Minks dilation N/A 12/31/2013    Procedure: Venia Minks DILATION;  Surgeon: Rogene Houston, MD;  Location: [facility];  Service: Endoscopy;  Laterality: N/A;  . Savory dilation N/A 12/31/2013    Procedure: SAVORY DILATION;  Surgeon: Rogene Houston, MD;  Location: [facility];  Service: Endoscopy;  Laterality: N/A;   Family History  Problem Relation Age of Onset  . Asthma Mother   . Asthma Father   . Cancer Father    History  Substance Use Topics  . Smoking status: Former Smoker    Types: Cigarettes    Quit date: 10/26/2011  . Smokeless tobacco: Never Used  . Alcohol Use: 1.2 oz/week    2 Cans of beer per week     Comment: social   OB History    Gravida Para Term Preterm AB TAB SAB Ectopic Multiple Living   2 2        1      Review of Systems  No urinary or fecal incontinence .All other systems reviewed and are  negative  Allergies  Review of patient's allergies indicates no known allergies.  Home Medications   Prior to Admission medications   Medication Sig Start Date End Date Taking? Authorizing Provider  albuterol (PROVENTIL HFA;VENTOLIN HFA) 108 (90 BASE) MCG/ACT inhaler Inhale 2 puffs into the lungs every 6 (six) hours as needed for wheezing or shortness of breath. For shortness of breath Patient taking differently: Inhale 2 puffs into the lungs every 6 (six) hours as needed for wheezing or shortness of breath.  07/31/11  Yes Kathie Dike, MD  albuterol (PROVENTIL) (2.5 MG/3ML) 0.083% nebulizer solution Take 2.5 mg by nebulization every 6 (six) hours as needed for wheezing or shortness of breath.    Yes Historical Provider, MD  diazepam (VALIUM) 10 MG tablet Take 10 mg  by mouth 2 (two) times daily as needed for anxiety.    Yes Historical Provider, MD  diltiazem (CARDIZEM CD) 240 MG 24 hr capsule Take 240 mg by mouth daily.   Yes Historical Provider, MD  lisinopril (PRINIVIL,ZESTRIL) 20 MG tablet Take 1 tablet (20 mg total) by mouth daily. 07/31/11  Yes Kathie Dike, MD  neomycin-bacitracin-polymyxin (NEOSPORIN) OINT Apply 1 application topically 3 (three) times daily as needed (eczema).   Yes Historical Provider, MD  Omega-3 Fatty Acids (FISH OIL) 1200 MG CAPS Take 1,200-2,400 mg by mouth daily.   Yes Historical Provider, MD  oxyCODONE-acetaminophen (PERCOCET) 10-325 MG per tablet Take 1 tablet by mouth every 4 (four) hours as needed for pain.    Yes Historical Provider, MD  pantoprazole (PROTONIX) 40 MG tablet TAKE 1 TABLET BY MOUTH TWICE DAILY BEFORE A MEAL. 08/03/14  Yes Rogene Houston, MD  PRESCRIPTION MEDICATION Apply 1 application topically 3 (three) times daily as needed (eczmea). Medicated cream   Yes Historical Provider, MD  sucralfate (CARAFATE) 1 GM/10ML suspension Take 1.5 g by mouth at bedtime as needed (acid reflux).    Yes Historical Provider, MD  chlorzoxazone (PARAFON FORTE DSC) 500 MG tablet Take 1 tablet (500 mg total) by mouth 3 (three) times daily as needed for muscle spasms. 08/14/14   Dot Lanes, MD  cyclobenzaprine (FLEXERIL) 10 MG tablet Take 1 tablet (10 mg total) by mouth 3 (three) times daily as needed for muscle spasms. 08/14/14   Dot Lanes, MD  omeprazole (PRILOSEC) 20 MG capsule Take 1 capsule (20 mg total) by mouth daily. Patient not taking: Reported on 08/14/2014 06/25/14   Ezequiel Essex, MD  polyethylene glycol (GOLYTELY) 236 G solution Take 4,000 mLs by mouth once. Patient not taking: Reported on 08/14/2014 07/01/14   Kristen N Ward, DO   BP 157/94 mmHg  Pulse 70  Temp(Src) 97.8 F (36.6 C)  Resp 16  SpO2 98% Physical Exam  Constitutional: She is oriented to person, place, and time. She appears well-developed and  well-nourished. No distress.  HENT:  Head: Normocephalic and atraumatic.  Eyes: Pupils are equal, round, and reactive to light.  Neck: Normal range of motion.  Cardiovascular: Normal rate and intact distal pulses.   Pulmonary/Chest: No respiratory distress.  Abdominal: Normal appearance. She exhibits no distension.  Musculoskeletal:       Back:  Neurological: She is alert and oriented to person, place, and time. She has normal strength. She displays no tremor. No cranial nerve deficit or sensory deficit. She exhibits normal muscle tone. GCS eye subscore is 4. GCS verbal subscore is 5. GCS motor subscore is 6.  Skin: Skin is warm and dry. No rash noted.  Psychiatric: She  has a normal mood and affect. Her behavior is normal.  Nursing note and vitals reviewed.   ED Course  Procedures (including critical care time) Labs Review   Imaging Review Results for orders placed or performed during the hospital encounter of 08/14/14  Comprehensive metabolic panel  Result Value Ref Range   Sodium 138 135 - 145 mmol/L   Potassium 3.6 3.5 - 5.1 mmol/L   Chloride 102 96 - 112 mmol/L   CO2 26 19 - 32 mmol/L   Glucose, Bld 157 (H) 70 - 99 mg/dL   BUN 8 6 - 23 mg/dL   Creatinine, Ser 0.72 0.50 - 1.10 mg/dL   Calcium 9.4 8.4 - 10.5 mg/dL   Total Protein 7.2 6.0 - 8.3 g/dL   Albumin 3.8 3.5 - 5.2 g/dL   AST 26 0 - 37 U/L   ALT 27 0 - 35 U/L   Alkaline Phosphatase 101 39 - 117 U/L   Total Bilirubin 0.4 0.3 - 1.2 mg/dL   GFR calc non Af Amer >90 >90 mL/min   GFR calc Af Amer >90 >90 mL/min   Anion gap 10 5 - 15  Urinalysis, Routine w reflex microscopic  Result Value Ref Range   Color, Urine YELLOW YELLOW   APPearance CLEAR CLEAR   Specific Gravity, Urine 1.022 1.005 - 1.030   pH 5.5 5.0 - 8.0   Glucose, UA NEGATIVE NEGATIVE mg/dL   Hgb urine dipstick NEGATIVE NEGATIVE   Bilirubin Urine NEGATIVE NEGATIVE   Ketones, ur 15 (A) NEGATIVE mg/dL   Protein, ur NEGATIVE NEGATIVE mg/dL    Urobilinogen, UA 0.2 0.0 - 1.0 mg/dL   Nitrite NEGATIVE NEGATIVE   Leukocytes, UA NEGATIVE NEGATIVE  CBC with Differential/Platelet  Result Value Ref Range   WBC 5.2 4.0 - 10.5 K/uL   RBC 4.17 3.87 - 5.11 MIL/uL   Hemoglobin 13.2 12.0 - 15.0 g/dL   HCT 40.1 36.0 - 46.0 %   MCV 96.2 78.0 - 100.0 fL   MCH 31.7 26.0 - 34.0 pg   MCHC 32.9 30.0 - 36.0 g/dL   RDW 12.2 11.5 - 15.5 %   Platelets 240 150 - 400 K/uL   Neutrophils Relative % 55 43 - 77 %   Neutro Abs 2.9 1.7 - 7.7 K/uL   Lymphocytes Relative 33 12 - 46 %   Lymphs Abs 1.7 0.7 - 4.0 K/uL   Monocytes Relative 8 3 - 12 %   Monocytes Absolute 0.4 0.1 - 1.0 K/uL   Eosinophils Relative 3 0 - 5 %   Eosinophils Absolute 0.2 0.0 - 0.7 K/uL   Basophils Relative 1 0 - 1 %   Basophils Absolute 0.1 0.0 - 0.1 K/uL   Ct Lumbar Spine Wo Contrast  08/14/2014   CLINICAL DATA:  Thrown from horse before Christmas. Back pain. Continued back and abdominal pain. Subsequent encounter. Persistent symptoms.  EXAM: CT LUMBAR SPINE WITHOUT CONTRAST  TECHNIQUE: Multidetector CT imaging of the lumbar spine was performed without intravenous contrast administration. Multiplanar CT image reconstructions were also generated.  COMPARISON:  06/25/2014 CT.  FINDINGS: Further collapse of the L1 superior endplate compression fracture. Maximal loss of height anteriorly is now about 50%. Retropulsion is similar, measuring 6 mm. There is some sclerosis along the superior endplate suggesting healing. Fracture cleavage plane remains visible.  Mild central stenosis results from the retropulsion dorsal to the L1 vertebra. Other vertebral bodies show preserved height. Severe bilateral sacroiliac joint osteoarthritis is present. No sacral fracture. Cholecystectomy clips  are present in the right upper quadrant. No new fractures or aggressive osseous lesions.  IMPRESSION: Further collapse of the chronic L1 compression fracture, now with 50% loss of vertebral body height. This can be  seen in normal healing with resorbtion around the fracture planes but can also be associated with posttraumatic osteonecrosis of the vertebral body (Kummel disease).   Electronically Signed   By: Dereck Ligas M.D.   On: 08/14/2014 18:09      I discussed the case with neurosurgery(Dr. Cyndy Freeze), who requested patient be discharged and seen in office in the next 24-48 hours.  MDM   Final diagnoses:  Compression fracture of L1 lumbar vertebra, with delayed healing, subsequent encounter        Dot Lanes, MD 08/18/14 [phone]

## 2014-09-20 ENCOUNTER — Encounter (HOSPITAL_COMMUNITY): Payer: Self-pay

## 2014-09-20 ENCOUNTER — Encounter (HOSPITAL_COMMUNITY)
Admission: RE | Admit: 2014-09-20 | Discharge: 2014-09-20 | Disposition: A | Discharge status: Home / Self Care | Payer: Medicaid Other | Source: Ambulatory Visit | Attending: Orthopedic Surgery | Admitting: Orthopedic Surgery

## 2014-09-20 HISTORY — DX: Reserved for inherently not codable concepts without codable children: IMO0001

## 2014-09-20 LAB — BASIC METABOLIC PANEL
ANION GAP: 3 — AB (ref 5–15)
BUN: 10 mg/dL (ref 6–23)
CALCIUM: 8.9 mg/dL (ref 8.4–10.5)
CHLORIDE: 105 mmol/L (ref 96–112)
CO2: 30 mmol/L (ref 19–32)
Creatinine, Ser: 0.66 mg/dL (ref 0.50–1.10)
GFR calc Af Amer: >90 mL/min (ref >90)
GFR calc non Af Amer: >90 mL/min (ref >90)
GLUCOSE: 116 mg/dL — AB (ref 70–99)
POTASSIUM: 3.7 mmol/L (ref 3.5–5.1)
SODIUM: 138 mmol/L (ref 135–145)

## 2014-09-20 LAB — SURGICAL PCR SCREEN
MRSA, PCR: NEGATIVE
Staphylococcus aureus: NEGATIVE

## 2014-09-20 LAB — CBC
HCT: 39.5 % (ref 36.0–46.0)
Hemoglobin: 13 g/dL (ref 12.0–15.0)
MCH: 31.3 pg (ref 26.0–34.0)
MCHC: 32.9 g/dL (ref 30.0–36.0)
MCV: 95 fL (ref 78.0–100.0)
PLATELETS: 272 10*3/uL (ref 150–400)
RBC: 4.16 MIL/uL (ref 3.87–5.11)
RDW: 13.1 % (ref 11.5–15.5)
WBC: 6.3 10*3/uL (ref 4.0–10.5)

## 2014-09-20 LAB — TYPE AND SCREEN
ABO/RH(D): O POS
Antibody Screen: NEGATIVE

## 2014-09-20 LAB — ABO/RH: ABO/RH(D): O POS

## 2014-09-20 NOTE — Progress Notes (Signed)
PCP is Dr Redmond School Denies seeing a Cardiologist. Echo noted in epic from 2015 Stress test noted in epic 2013.

## 2014-09-20 NOTE — Pre-Procedure Instructions (Signed)
[name]  09/20/2014   Your procedure is scheduled on:  Thurs, Mar 10 @ 11:00 AM  Report to [facility]  at 9:00  AM.  Call this number if you have problems the morning of surgery: [phone]   Remember:   Do not eat food or drink liquids after midnight.   Take these medicines the morning of surgery with A SIP OF WATER: Albuterol<Bring Your Inhaler With You>,Buspar(Buspirone),Valium(Diazepam),Diltiazem(Cardizem),Dulera(Mometasone),Omeprazole(Prilosec),Pain Pill(if needed),and Pantoprazole(Protonix)               Stop taking your Phentermine. No Goody's,BC's,Aleve,Aspirin,Ibuprofen,Fish Oil,or any Herbal Medications.                 Do not wear jewelry, make-up or nail polish.  Do not wear lotions, powders, or perfumes. You may wear deodorant.  Do not shave 48 hours prior to surgery.   Do not bring valuables to the hospital.  [facility] is not responsible                  for any belongings or valuables.               Contacts, dentures or bridgework may not be worn into surgery.  Leave suitcase in the car. After surgery it may be brought to your room.  For patients admitted to the hospital, discharge time is determined by your                treatment team.             Special Instructions:  [name] - Preparing for Surgery  Before surgery, you can play an important role.  Because skin is not sterile, your skin needs to be as free of germs as possible.  You can reduce the number of germs on you skin by washing with CHG (chlorahexidine gluconate) soap before surgery.  CHG is an antiseptic cleaner which kills germs and bonds with the skin to continue killing germs even after washing.  Please DO NOT use if you have an allergy to CHG or antibacterial soaps.  If your skin becomes reddened/irritated stop using the CHG and inform your nurse when you arrive at Short Stay.  Do not shave (including legs and underarms) for at least 48 hours prior to the first CHG shower.   You may shave your face.  Please follow these instructions carefully:   1.  Shower with CHG Soap the night before surgery and the                                morning of Surgery.  2.  If you choose to wash your hair, wash your hair first as usual with your       normal shampoo.  3.  After you shampoo, rinse your hair and body thoroughly to remove the                      Shampoo.  4.  Use CHG as you would any other liquid soap.  You can apply chg directly       to the skin and wash gently with scrungie or a clean washcloth.  5.  Apply the CHG Soap to your body ONLY FROM THE NECK DOWN.        Do not use on open wounds or open sores.  Avoid contact with your eyes,  ears, mouth and genitals (private parts).  Wash genitals (private parts)       with your normal soap.  6.  Wash thoroughly, paying special attention to the area where your surgery        will be performed.  7.  Thoroughly rinse your body with warm water from the neck down.  8.  DO NOT shower/wash with your normal soap after using and rinsing off       the CHG Soap.  9.  Pat yourself dry with a clean towel.            10.  Wear clean pajamas.            11.  Place clean sheets on your bed the night of your first shower and do not        sleep with pets.  Day of Surgery  Do not apply any lotions/deoderants the morning of surgery.  Please wear clean clothes to the hospital/surgery center.     Please read over the following fact sheets that you were given: Pain Booklet, Coughing and Deep Breathing, Blood Transfusion Information, MRSA Information and Surgical Site Infection Prevention

## 2014-09-21 MED ORDER — VANCOMYCIN HCL IN DEXTROSE 1-5 GM/200ML-% IV SOLN
1000.0000 mg | INTRAVENOUS | Status: AC
  Administered 2014-09-22: 1000 mg via INTRAVENOUS
  Filled 2014-09-21: qty 200

## 2014-09-21 MED ORDER — DEXAMETHASONE SODIUM PHOSPHATE 4 MG/ML IJ SOLN
10.0000 mg | Freq: Once | INTRAMUSCULAR | Status: DC
  Filled 2014-09-21: qty 3

## 2014-09-21 NOTE — Progress Notes (Signed)
[name] in the main lab called to say that they did not have the serum pregnancy sample for this patient, so we will have to redraw the lab or do a urine pregnancy test.

## 2014-09-22 ENCOUNTER — Inpatient Hospital Stay (HOSPITAL_COMMUNITY): Payer: Medicaid Other | Admitting: Certified Registered Nurse Anesthetist

## 2014-09-22 ENCOUNTER — Inpatient Hospital Stay (HOSPITAL_COMMUNITY): Payer: Medicaid Other

## 2014-09-22 ENCOUNTER — Encounter (HOSPITAL_COMMUNITY): Payer: Self-pay | Admitting: *Deleted

## 2014-09-22 ENCOUNTER — Inpatient Hospital Stay (HOSPITAL_COMMUNITY)
Admission: RE | Admit: 2014-09-22 | Discharge: 2014-09-26 | DRG: 460 | Disposition: A | Discharge status: Home / Self Care | Payer: Medicaid Other | Source: Ambulatory Visit | Attending: Orthopedic Surgery | Admitting: Orthopedic Surgery

## 2014-09-22 ENCOUNTER — Encounter (HOSPITAL_COMMUNITY)
Admission: RE | Disposition: A | Discharge status: Home / Self Care | Payer: Self-pay | Source: Ambulatory Visit | Attending: Orthopedic Surgery

## 2014-09-22 DIAGNOSIS — J9572 Accidental puncture and laceration of a respiratory system organ or structure during other procedure: Secondary | ICD-10-CM | POA: Diagnosis not present

## 2014-09-22 DIAGNOSIS — S32011A Stable burst fracture of first lumbar vertebra, initial encounter for closed fracture: Secondary | ICD-10-CM | POA: Diagnosis present

## 2014-09-22 DIAGNOSIS — K219 Gastro-esophageal reflux disease without esophagitis: Secondary | ICD-10-CM | POA: Diagnosis present

## 2014-09-22 DIAGNOSIS — K5900 Constipation, unspecified: Secondary | ICD-10-CM | POA: Diagnosis not present

## 2014-09-22 DIAGNOSIS — M797 Fibromyalgia: Secondary | ICD-10-CM | POA: Diagnosis present

## 2014-09-22 DIAGNOSIS — F329 Major depressive disorder, single episode, unspecified: Secondary | ICD-10-CM | POA: Diagnosis present

## 2014-09-22 DIAGNOSIS — G894 Chronic pain syndrome: Secondary | ICD-10-CM | POA: Diagnosis present

## 2014-09-22 DIAGNOSIS — I10 Essential (primary) hypertension: Secondary | ICD-10-CM | POA: Diagnosis present

## 2014-09-22 DIAGNOSIS — Y838 Other surgical procedures as the cause of abnormal reaction of the patient, or of later complication, without mention of misadventure at the time of the procedure: Secondary | ICD-10-CM | POA: Diagnosis not present

## 2014-09-22 DIAGNOSIS — S32001A Stable burst fracture of unspecified lumbar vertebra, initial encounter for closed fracture: Secondary | ICD-10-CM

## 2014-09-22 DIAGNOSIS — J45909 Unspecified asthma, uncomplicated: Secondary | ICD-10-CM | POA: Diagnosis present

## 2014-09-22 DIAGNOSIS — F419 Anxiety disorder, unspecified: Secondary | ICD-10-CM | POA: Diagnosis present

## 2014-09-22 DIAGNOSIS — J939 Pneumothorax, unspecified: Secondary | ICD-10-CM

## 2014-09-22 DIAGNOSIS — J449 Chronic obstructive pulmonary disease, unspecified: Secondary | ICD-10-CM | POA: Diagnosis present

## 2014-09-22 DIAGNOSIS — Z419 Encounter for procedure for purposes other than remedying health state, unspecified: Secondary | ICD-10-CM

## 2014-09-22 DIAGNOSIS — M549 Dorsalgia, unspecified: Secondary | ICD-10-CM | POA: Diagnosis present

## 2014-09-22 DIAGNOSIS — R079 Chest pain, unspecified: Secondary | ICD-10-CM

## 2014-09-22 LAB — PREGNANCY, URINE: PREG TEST UR: NEGATIVE

## 2014-09-22 SURGERY — POSTERIOR LUMBAR FUSION 2 LEVEL
Anesthesia: General | Site: Back

## 2014-09-22 MED ORDER — DIAZEPAM 5 MG PO TABS
5.0000 mg | ORAL_TABLET | ORAL | Status: DC | PRN
  Administered 2014-09-22: 5 mg via ORAL
  Filled 2014-09-22: qty 1

## 2014-09-22 MED ORDER — MOMETASONE FURO-FORMOTEROL FUM 200-5 MCG/ACT IN AERO
2.0000 | INHALATION_SPRAY | Freq: Two times a day (BID) | RESPIRATORY_TRACT | Status: DC
  Administered 2014-09-23 – 2014-09-26 (×7): 2 via RESPIRATORY_TRACT
  Filled 2014-09-22: qty 8.8

## 2014-09-22 MED ORDER — MIDAZOLAM HCL 2 MG/2ML IJ SOLN
INTRAMUSCULAR | Status: AC
  Filled 2014-09-22: qty 2

## 2014-09-22 MED ORDER — FENTANYL CITRATE 0.05 MG/ML IJ SOLN
INTRAMUSCULAR | Status: AC
  Filled 2014-09-22: qty 2

## 2014-09-22 MED ORDER — ACETAMINOPHEN 10 MG/ML IV SOLN
1000.0000 mg | Freq: Four times a day (QID) | INTRAVENOUS | Status: DC
  Administered 2014-09-22 (×2): 1000 mg via INTRAVENOUS
  Filled 2014-09-22: qty 100

## 2014-09-22 MED ORDER — MENTHOL 3 MG MT LOZG: 1.0000 | LOZENGE | OROMUCOSAL | Status: DC | PRN

## 2014-09-22 MED ORDER — BUPIVACAINE-EPINEPHRINE 0.25% -1:200000 IJ SOLN
INTRAMUSCULAR | Status: DC | PRN
  Administered 2014-09-22: 10 mL

## 2014-09-22 MED ORDER — PROPOFOL 10 MG/ML IV BOLUS
INTRAVENOUS | Status: AC
  Filled 2014-09-22: qty 20

## 2014-09-22 MED ORDER — PHENOL 1.4 % MT LIQD: 1.0000 | OROMUCOSAL | Status: DC | PRN

## 2014-09-22 MED ORDER — THROMBIN 20000 UNITS EX SOLR
CUTANEOUS | Status: DC | PRN
  Administered 2014-09-22: 20 mL via TOPICAL

## 2014-09-22 MED ORDER — OXYCODONE HCL 5 MG PO TABS
5.0000 mg | ORAL_TABLET | Freq: Once | ORAL | Status: AC | PRN
  Administered 2014-09-22: 5 mg via ORAL

## 2014-09-22 MED ORDER — DOCUSATE SODIUM 100 MG PO CAPS
100.0000 mg | ORAL_CAPSULE | Freq: Two times a day (BID) | ORAL | Status: DC
  Administered 2014-09-23 – 2014-09-26 (×7): 100 mg via ORAL
  Filled 2014-09-22 (×8): qty 1

## 2014-09-22 MED ORDER — DEXAMETHASONE SODIUM PHOSPHATE 4 MG/ML IJ SOLN
INTRAMUSCULAR | Status: AC
  Filled 2014-09-22: qty 1

## 2014-09-22 MED ORDER — METHOCARBAMOL 1000 MG/10ML IJ SOLN
500.0000 mg | Freq: Four times a day (QID) | INTRAVENOUS | Status: DC | PRN
  Filled 2014-09-22: qty 5

## 2014-09-22 MED ORDER — LACTATED RINGERS IV SOLN
INTRAVENOUS | Status: DC | PRN
  Administered 2014-09-22 (×5): via INTRAVENOUS

## 2014-09-22 MED ORDER — HYDROMORPHONE HCL 1 MG/ML IJ SOLN
0.5000 mg | INTRAMUSCULAR | Status: DC | PRN
  Administered 2014-09-22: 1 mg via INTRAVENOUS
  Filled 2014-09-22: qty 1

## 2014-09-22 MED ORDER — DEXAMETHASONE SODIUM PHOSPHATE 10 MG/ML IJ SOLN
INTRAMUSCULAR | Status: AC
  Filled 2014-09-22: qty 1

## 2014-09-22 MED ORDER — OXYCODONE HCL 5 MG/5ML PO SOLN: 5.0000 mg | Freq: Once | ORAL | Status: AC | PRN

## 2014-09-22 MED ORDER — ALBUTEROL SULFATE (2.5 MG/3ML) 0.083% IN NEBU
2.5000 mg | INHALATION_SOLUTION | Freq: Four times a day (QID) | RESPIRATORY_TRACT | Status: DC | PRN
  Administered 2014-09-23 – 2014-09-26 (×6): 2.5 mg via RESPIRATORY_TRACT
  Filled 2014-09-22 (×6): qty 3

## 2014-09-22 MED ORDER — FENTANYL CITRATE 0.05 MG/ML IJ SOLN
INTRAMUSCULAR | Status: AC
  Filled 2014-09-22: qty 5

## 2014-09-22 MED ORDER — MIDAZOLAM HCL 5 MG/5ML IJ SOLN
INTRAMUSCULAR | Status: DC | PRN
  Administered 2014-09-22: 2 mg via INTRAVENOUS

## 2014-09-22 MED ORDER — SODIUM CHLORIDE 0.9 % IJ SOLN
3.0000 mL | Freq: Two times a day (BID) | INTRAMUSCULAR | Status: DC
  Administered 2014-09-24 (×2): 3 mL via INTRAVENOUS

## 2014-09-22 MED ORDER — ARTIFICIAL TEARS OP OINT
TOPICAL_OINTMENT | OPHTHALMIC | Status: DC | PRN
  Administered 2014-09-22: 1 via OPHTHALMIC

## 2014-09-22 MED ORDER — SUFENTANIL CITRATE 50 MCG/ML IV SOLN
INTRAVENOUS | Status: AC
  Filled 2014-09-22: qty 1

## 2014-09-22 MED ORDER — HYDROMORPHONE HCL 1 MG/ML IJ SOLN
INTRAMUSCULAR | Status: AC
  Filled 2014-09-22: qty 1

## 2014-09-22 MED ORDER — PHENYLEPHRINE HCL 10 MG/ML IJ SOLN
10.0000 mg | INTRAVENOUS | Status: DC | PRN
  Administered 2014-09-22: 20 ug/min via INTRAVENOUS

## 2014-09-22 MED ORDER — THROMBIN 20000 UNITS EX SOLR
CUTANEOUS | Status: AC
  Filled 2014-09-22: qty 40000

## 2014-09-22 MED ORDER — METHOCARBAMOL 500 MG PO TABS
500.0000 mg | ORAL_TABLET | Freq: Four times a day (QID) | ORAL | Status: DC | PRN
  Administered 2014-09-23: 500 mg via ORAL
  Filled 2014-09-22 (×2): qty 1

## 2014-09-22 MED ORDER — PROPOFOL 10 MG/ML IV BOLUS
INTRAVENOUS | Status: DC | PRN
  Administered 2014-09-22: 150 mg via INTRAVENOUS

## 2014-09-22 MED ORDER — FLEET ENEMA 7-19 GM/118ML RE ENEM: 1.0000 | ENEMA | Freq: Once | RECTAL | Status: AC | PRN

## 2014-09-22 MED ORDER — ONDANSETRON HCL 4 MG/2ML IJ SOLN
INTRAMUSCULAR | Status: AC
  Filled 2014-09-22: qty 2

## 2014-09-22 MED ORDER — SUFENTANIL CITRATE 50 MCG/ML IV SOLN
INTRAVENOUS | Status: DC | PRN
  Administered 2014-09-22 (×3): 5 ug via INTRAVENOUS
  Administered 2014-09-22 (×2): 10 ug via INTRAVENOUS
  Administered 2014-09-22: 5 ug via INTRAVENOUS
  Administered 2014-09-22 (×3): 10 ug via INTRAVENOUS
  Administered 2014-09-22: 5 ug via INTRAVENOUS
  Administered 2014-09-22 (×2): 10 ug via INTRAVENOUS
  Administered 2014-09-22: 5 ug via INTRAVENOUS

## 2014-09-22 MED ORDER — ACETAMINOPHEN 10 MG/ML IV SOLN
1000.0000 mg | Freq: Four times a day (QID) | INTRAVENOUS | Status: AC
  Administered 2014-09-22 – 2014-09-23 (×4): 1000 mg via INTRAVENOUS
  Filled 2014-09-22 (×4): qty 100

## 2014-09-22 MED ORDER — LISINOPRIL 20 MG PO TABS
20.0000 mg | ORAL_TABLET | Freq: Every day | ORAL | Status: DC
  Administered 2014-09-23 – 2014-09-26 (×4): 20 mg via ORAL
  Filled 2014-09-22 (×4): qty 1

## 2014-09-22 MED ORDER — PROPOFOL 10 MG/ML IV EMUL
INTRAVENOUS | Status: AC
  Filled 2014-09-22: qty 50

## 2014-09-22 MED ORDER — ONDANSETRON HCL 4 MG/2ML IJ SOLN
4.0000 mg | INTRAMUSCULAR | Status: DC | PRN
  Administered 2014-09-22 – 2014-09-23 (×3): 4 mg via INTRAVENOUS
  Filled 2014-09-22 (×3): qty 2

## 2014-09-22 MED ORDER — PHENYLEPHRINE HCL 10 MG/ML IJ SOLN
INTRAMUSCULAR | Status: DC | PRN
  Administered 2014-09-22 (×2): 80 ug via INTRAVENOUS

## 2014-09-22 MED ORDER — SUCCINYLCHOLINE CHLORIDE 20 MG/ML IJ SOLN
INTRAMUSCULAR | Status: DC | PRN
Start: 2014-09-22 — End: 2014-09-22
  Administered 2014-09-22: 100 mg via INTRAVENOUS

## 2014-09-22 MED ORDER — LIDOCAINE HCL (CARDIAC) 20 MG/ML IV SOLN
INTRAVENOUS | Status: AC
  Filled 2014-09-22: qty 5

## 2014-09-22 MED ORDER — SODIUM CHLORIDE 0.9 % IV SOLN: 250.0000 mL | INTRAVENOUS | Status: DC

## 2014-09-22 MED ORDER — ONDANSETRON HCL 4 MG/2ML IJ SOLN: 4.0000 mg | Freq: Once | INTRAMUSCULAR | Status: DC | PRN

## 2014-09-22 MED ORDER — HEMOSTATIC AGENTS (NO CHARGE) OPTIME
TOPICAL | Status: DC | PRN
  Administered 2014-09-22: 1 via TOPICAL

## 2014-09-22 MED ORDER — HYDROMORPHONE HCL 1 MG/ML IJ SOLN
0.5000 mg | INTRAMUSCULAR | Status: DC | PRN
  Administered 2014-09-22 (×6): 0.5 mg via INTRAVENOUS

## 2014-09-22 MED ORDER — ALBUMIN HUMAN 5 % IV SOLN
INTRAVENOUS | Status: DC | PRN
  Administered 2014-09-22: 15:00:00 via INTRAVENOUS

## 2014-09-22 MED ORDER — DEXAMETHASONE SODIUM PHOSPHATE 4 MG/ML IJ SOLN
INTRAMUSCULAR | Status: DC | PRN
  Administered 2014-09-22: 4 mg via INTRAVENOUS

## 2014-09-22 MED ORDER — KETOROLAC TROMETHAMINE 30 MG/ML IJ SOLN
30.0000 mg | Freq: Once | INTRAMUSCULAR | Status: AC | PRN
  Administered 2014-09-22: 30 mg via INTRAVENOUS

## 2014-09-22 MED ORDER — KETOROLAC TROMETHAMINE 30 MG/ML IJ SOLN
INTRAMUSCULAR | Status: AC
  Filled 2014-09-22: qty 1

## 2014-09-22 MED ORDER — DEXAMETHASONE 4 MG PO TABS
4.0000 mg | ORAL_TABLET | Freq: Four times a day (QID) | ORAL | Status: DC
  Administered 2014-09-23 – 2014-09-26 (×12): 4 mg via ORAL
  Filled 2014-09-22 (×12): qty 1

## 2014-09-22 MED ORDER — ONDANSETRON HCL 4 MG/2ML IJ SOLN
INTRAMUSCULAR | Status: DC | PRN
  Administered 2014-09-22: 4 mg via INTRAVENOUS

## 2014-09-22 MED ORDER — OXYCODONE HCL 5 MG PO TABS
ORAL_TABLET | ORAL | Status: AC
  Filled 2014-09-22: qty 1

## 2014-09-22 MED ORDER — OXYCODONE HCL 5 MG PO TABS
10.0000 mg | ORAL_TABLET | ORAL | Status: DC | PRN
  Administered 2014-09-23: 10 mg via ORAL
  Filled 2014-09-22: qty 2

## 2014-09-22 MED ORDER — DEXAMETHASONE SODIUM PHOSPHATE 4 MG/ML IJ SOLN
4.0000 mg | Freq: Four times a day (QID) | INTRAMUSCULAR | Status: DC
  Administered 2014-09-22 – 2014-09-23 (×3): 4 mg via INTRAVENOUS
  Filled 2014-09-22 (×4): qty 1

## 2014-09-22 MED ORDER — ALBUTEROL SULFATE HFA 108 (90 BASE) MCG/ACT IN AERS: 2.0000 | INHALATION_SPRAY | Freq: Four times a day (QID) | RESPIRATORY_TRACT | Status: DC | PRN

## 2014-09-22 MED ORDER — LIDOCAINE HCL (CARDIAC) 20 MG/ML IV SOLN
INTRAVENOUS | Status: DC | PRN
  Administered 2014-09-22: 80 mg via INTRAVENOUS

## 2014-09-22 MED ORDER — VANCOMYCIN HCL IN DEXTROSE 1-5 GM/200ML-% IV SOLN
1000.0000 mg | Freq: Once | INTRAVENOUS | Status: AC
  Administered 2014-09-23: 1000 mg via INTRAVENOUS
  Filled 2014-09-22: qty 200

## 2014-09-22 MED ORDER — FENTANYL CITRATE 0.05 MG/ML IJ SOLN
INTRAMUSCULAR | Status: DC | PRN
Start: 2014-09-22 — End: 2014-09-22
  Administered 2014-09-22: 100 ug via INTRAVENOUS
  Administered 2014-09-22: 50 ug via INTRAVENOUS
  Administered 2014-09-22: 100 ug via INTRAVENOUS
  Administered 2014-09-22: 50 ug via INTRAVENOUS
  Administered 2014-09-22 (×2): 100 ug via INTRAVENOUS

## 2014-09-22 MED ORDER — PROPOFOL INFUSION 10 MG/ML OPTIME
INTRAVENOUS | Status: DC | PRN
Start: 2014-09-22 — End: 2014-09-22
  Administered 2014-09-22 (×2): 50 ug/kg/min via INTRAVENOUS

## 2014-09-22 MED ORDER — ACETAMINOPHEN 10 MG/ML IV SOLN
INTRAVENOUS | Status: AC
  Filled 2014-09-22: qty 100

## 2014-09-22 MED ORDER — FENTANYL CITRATE 0.05 MG/ML IJ SOLN
25.0000 ug | INTRAMUSCULAR | Status: DC | PRN
  Administered 2014-09-22 (×4): 50 ug via INTRAVENOUS

## 2014-09-22 MED ORDER — MIDAZOLAM HCL 2 MG/2ML IJ SOLN
1.0000 mg | INTRAMUSCULAR | Status: DC | PRN
Start: 2014-09-22 — End: 2014-09-22
  Administered 2014-09-22 (×3): 2 mg via INTRAVENOUS

## 2014-09-22 MED ORDER — LACTATED RINGERS IV SOLN
INTRAVENOUS | Status: DC
  Administered 2014-09-23: 07:00:00 via INTRAVENOUS

## 2014-09-22 MED ORDER — DILTIAZEM HCL ER COATED BEADS 240 MG PO CP24
240.0000 mg | ORAL_CAPSULE | Freq: Every day | ORAL | Status: DC
  Administered 2014-09-23 – 2014-09-26 (×4): 240 mg via ORAL
  Filled 2014-09-22 (×5): qty 1

## 2014-09-22 MED ORDER — SODIUM CHLORIDE 0.9 % IJ SOLN: 3.0000 mL | INTRAMUSCULAR | Status: DC | PRN

## 2014-09-22 MED ORDER — 0.9 % SODIUM CHLORIDE (POUR BTL) OPTIME
TOPICAL | Status: DC | PRN
  Administered 2014-09-22: 2000 mL

## 2014-09-22 MED ORDER — LACTATED RINGERS IV SOLN
INTRAVENOUS | Status: DC
  Administered 2014-09-22: 10:00:00 via INTRAVENOUS

## 2014-09-22 SURGICAL SUPPLY — 117 items
ADH SKN CLS APL DERMABOND .7 (GAUZE/BANDAGES/DRESSINGS)
APPLIER CLIP 11 MED OPEN (CLIP)
APR CLP MED 11 20 MLT OPN (CLIP)
ATTRACTOMAT 16X20 MAGNETIC DRP (DRAPES) ×1 IMPLANT
BLADE SURG 10 STRL SS (BLADE) ×7 IMPLANT
BLADE SURG 15 STRL LF DISP TIS (BLADE) ×1 IMPLANT
BLADE SURG 15 STRL SS (BLADE) ×4
BLADE SURG ROTATE 9660 (MISCELLANEOUS) IMPLANT
BUR EGG ELITE 4.0 (BURR) ×3 IMPLANT
BUR EGG ELITE 4.0MM (BURR) ×1
CAGE XCORE 16X23-35 (Cage) ×2 IMPLANT
CAGE XCORE 16X23-35MM (Cage) ×1 IMPLANT
CAP END 2 TI LOCK SCREW X-CORE (Screw) ×4 IMPLANT
CLIP APPLIE 11 MED OPEN (CLIP) IMPLANT
CLIP NEUROVISION LG (CLIP) ×3 IMPLANT
CLOSURE STERI-STRIP 1/2X4 (GAUZE/BANDAGES/DRESSINGS) ×1
CLOSURE WOUND 1/2 X4 (GAUZE/BANDAGES/DRESSINGS) ×1
CLSR STERI-STRIP ANTIMIC 1/2X4 (GAUZE/BANDAGES/DRESSINGS) ×3 IMPLANT
CORDS BIPOLAR (ELECTRODE) ×4 IMPLANT
CORE X-CORE 2 TI 16X18-25MM (Neuro Prosthesis/Implant) ×3 IMPLANT
COUNTER NEEDLE 20 DBL MAG RED (NEEDLE) ×3 IMPLANT
COVER MAYO STAND STRL (DRAPES) ×3 IMPLANT
COVER SURGICAL LIGHT HANDLE (MISCELLANEOUS) ×4 IMPLANT
DERMABOND ADVANCED (GAUZE/BANDAGES/DRESSINGS)
DERMABOND ADVANCED .7 DNX12 (GAUZE/BANDAGES/DRESSINGS) ×1 IMPLANT
DRAPE C-ARM 42X72 X-RAY (DRAPES) ×8 IMPLANT
DRAPE C-ARMOR (DRAPES) ×7 IMPLANT
DRAPE INCISE IOBAN 66X45 STRL (DRAPES) ×3 IMPLANT
DRAPE ORTHO SPLIT 77X108 STRL (DRAPES) ×4
DRAPE POUCH INSTRU U-SHP 10X18 (DRAPES) ×7 IMPLANT
DRAPE SURG 17X23 STRL (DRAPES) ×4 IMPLANT
DRAPE SURG ORHT 6 SPLT 77X108 (DRAPES) ×2 IMPLANT
DRAPE U-SHAPE 47X51 STRL (DRAPES) ×4 IMPLANT
DRSG MEPILEX BORDER 4X12 (GAUZE/BANDAGES/DRESSINGS) ×3 IMPLANT
DRSG MEPILEX BORDER 4X8 (GAUZE/BANDAGES/DRESSINGS) ×10 IMPLANT
DURAPREP 26ML APPLICATOR (WOUND CARE) ×7 IMPLANT
ELECT BLADE 4.0 EZ CLEAN MEGAD (MISCELLANEOUS) ×4
ELECT BLADE 6.5 EXT (BLADE) ×7 IMPLANT
ELECT CAUTERY BLADE 6.4 (BLADE) ×4 IMPLANT
ELECT PENCIL ROCKER SW 15FT (MISCELLANEOUS) ×7 IMPLANT
ELECT REM PT RETURN 9FT ADLT (ELECTROSURGICAL) ×8
ELECTRODE BLDE 4.0 EZ CLN MEGD (MISCELLANEOUS) ×2 IMPLANT
ELECTRODE REM PT RTRN 9FT ADLT (ELECTROSURGICAL) ×3 IMPLANT
ENDCAP X-CORE TI 16 RND -0 (Screw) IMPLANT
ENDCAP X-CORE TI 16MM RND -0 (Screw) ×4 IMPLANT
GAUZE SPONGE 4X4 16PLY XRAY LF (GAUZE/BANDAGES/DRESSINGS) ×7 IMPLANT
GLOVE BIOGEL PI IND STRL 8 (GLOVE) ×2 IMPLANT
GLOVE BIOGEL PI IND STRL 8.5 (GLOVE) ×2 IMPLANT
GLOVE BIOGEL PI INDICATOR 8 (GLOVE) ×2
GLOVE BIOGEL PI INDICATOR 8.5 (GLOVE) ×2
GLOVE ECLIPSE 8.5 STRL (GLOVE) ×4 IMPLANT
GLOVE ORTHO TXT STRL SZ7.5 (GLOVE) ×4 IMPLANT
GLOVE SS BIOGEL STRL SZ 6.5 (GLOVE) ×2 IMPLANT
GLOVE SUPERSENSE BIOGEL SZ 6.5 (GLOVE) ×4
GOWN STRL REUS W/ TWL LRG LVL3 (GOWN DISPOSABLE) ×2 IMPLANT
GOWN STRL REUS W/TWL 2XL LVL3 (GOWN DISPOSABLE) ×8 IMPLANT
GOWN STRL REUS W/TWL LRG LVL3 (GOWN DISPOSABLE) ×4
GUIDEWIRE NITINOL BEVEL TIP (WIRE) ×24 IMPLANT
KIT BASIN OR (CUSTOM PROCEDURE TRAY) ×4 IMPLANT
KIT DILATOR XLIF 5 (KITS) ×1 IMPLANT
KIT NDL NVM5 EMG ELECT (KITS) IMPLANT
KIT NEEDLE NVM5 EMG ELECT (KITS) ×2 IMPLANT
KIT NEEDLE NVM5 EMG ELECTRODE (KITS) ×2
KIT POSITION SURG JACKSON T1 (MISCELLANEOUS) ×4 IMPLANT
KIT ROOM TURNOVER OR (KITS) ×4 IMPLANT
KIT SURGICAL ACCESS MAXCESS 4 (KITS) ×3 IMPLANT
KIT XLIF (KITS) ×2
MIX DBX 10CC 35% BONE (Bone Implant) ×3 IMPLANT
NDL I-PASS III (NEEDLE) IMPLANT
NDL SPNL 18GX3.5 QUINCKE PK (NEEDLE) ×1 IMPLANT
NEEDLE 22X1 1/2 (OR ONLY) (NEEDLE) ×4 IMPLANT
NEEDLE I-PASS III (NEEDLE) IMPLANT
NEEDLE SPNL 18GX3.5 QUINCKE PK (NEEDLE) ×4 IMPLANT
NS IRRIG 1000ML POUR BTL (IV SOLUTION) ×4 IMPLANT
PACK LAMINECTOMY ORTHO (CUSTOM PROCEDURE TRAY) ×4 IMPLANT
PACK UNIVERSAL I (CUSTOM PROCEDURE TRAY) ×7 IMPLANT
PAD ARMBOARD 7.5X6 YLW CONV (MISCELLANEOUS) ×11 IMPLANT
PATTIES SURGICAL .5 X.5 (GAUZE/BANDAGES/DRESSINGS) IMPLANT
PATTIES SURGICAL .5 X1 (DISPOSABLE) ×4 IMPLANT
POSITIONER HEAD PRONE TRACH (MISCELLANEOUS) ×4 IMPLANT
PROBE BALL TIP NVM5 SNG USE (BALLOONS) ×3 IMPLANT
ROD RELINE MAS ST 5.5X300MM (Rod) ×6 IMPLANT
SCREW LOCK RELINE 5.5 TULIP (Screw) ×24 IMPLANT
SCREW MAS RELINE POLY 6.5X40 (Screw) ×12 IMPLANT
SCREW RELINE MAS POLY 5.5X40 (Screw) ×12 IMPLANT
SCREW SET ENDCAP (Screw) ×8 IMPLANT
SPONGE INTESTINAL PEANUT (DISPOSABLE) ×5 IMPLANT
SPONGE LAP 18X18 X RAY DECT (DISPOSABLE) ×3 IMPLANT
SPONGE LAP 4X18 X RAY DECT (DISPOSABLE) ×11 IMPLANT
SPONGE SURGIFOAM ABS GEL 100 (HEMOSTASIS) ×4 IMPLANT
STAPLER VISISTAT 35W (STAPLE) ×3 IMPLANT
STRIP CLOSURE SKIN 1/2X4 (GAUZE/BANDAGES/DRESSINGS) ×2 IMPLANT
SURGIFLO TRUKIT (HEMOSTASIS) IMPLANT
SUT BONE WAX W31G (SUTURE) ×7 IMPLANT
SUT MNCRL AB 3-0 PS2 18 (SUTURE) ×17 IMPLANT
SUT MON AB 3-0 SH 27 (SUTURE)
SUT MON AB 3-0 SH27 (SUTURE) ×1 IMPLANT
SUT PROLENE 5 0 C 1 24 (SUTURE) IMPLANT
SUT SILK 2 0 TIES 10X30 (SUTURE) ×1 IMPLANT
SUT SILK 3 0 TIES 10X30 (SUTURE) ×1 IMPLANT
SUT VIC AB 1 CT1 18XCR BRD 8 (SUTURE) ×4 IMPLANT
SUT VIC AB 1 CT1 27 (SUTURE) ×4
SUT VIC AB 1 CT1 27XBRD ANBCTR (SUTURE) ×3 IMPLANT
SUT VIC AB 1 CT1 8-18 (SUTURE) ×12
SUT VIC AB 1 CTX 36 (SUTURE) ×4
SUT VIC AB 1 CTX36XBRD ANBCTR (SUTURE) ×3 IMPLANT
SUT VIC AB 2-0 CT1 18 (SUTURE) ×13 IMPLANT
SUT VICRYL 0 UR6 27IN ABS (SUTURE) ×3 IMPLANT
SYR BULB IRRIGATION 50ML (SYRINGE) ×4 IMPLANT
SYR CONTROL 10ML LL (SYRINGE) ×4 IMPLANT
TOWEL NATURAL 10PK STERILE (DISPOSABLE) ×3 IMPLANT
TOWEL OR 17X24 6PK STRL BLUE (TOWEL DISPOSABLE) ×7 IMPLANT
TOWEL OR 17X26 10 PK STRL BLUE (TOWEL DISPOSABLE) ×4 IMPLANT
TRAY FOLEY CATH 16FR SILVER (SET/KITS/TRAYS/PACK) ×4 IMPLANT
TRAY FOLEY CATH 16FRSI W/METER (SET/KITS/TRAYS/PACK) ×1 IMPLANT
WATER STERILE IRR 1000ML POUR (IV SOLUTION) ×1 IMPLANT
YANKAUER SUCT BULB TIP NO VENT (SUCTIONS) ×4 IMPLANT

## 2014-09-22 NOTE — OR Nursing (Signed)
XRay report read by Radiologist.  No retained instruments found in chest cavity.

## 2014-09-22 NOTE — H&P (Signed)
History of Present Illness The patient is a 49 year old female who comes in today for a preoperative History and Physical. The patient is scheduled for a lateral corpectomy with fusion T12-L2 to be performed by Dr. Duane Lope D. Rolena Infante, MD at [facility] on 09-22-14 . Please see the hospital record for complete dictated history and physical.  Allergies  No Known Drug Allergies02/10/2014  Family History Heart disease in female family member before age 38 Heart Disease mother Hypertension mother, father and sister Heart disease in female family member before age 89 Chronic Obstructive Lung Disease mother Cancer father Diabetes Mellitus mother Depression mother  Social History Number of flights of stairs before winded 2-3 Marital status single Living situation live with partner Tobacco use former smoker; smoke(d) 1/2 pack(s) per day Tobacco / smoke exposure no Pain Contract yes Illicit drug use no Current work status unemployed Children 1 Alcohol use current drinker; drinks beer; 5-7 per week Exercise Exercises weekly; does running / walking Drug/Alcohol Rehab (Previously) no Drug/Alcohol Rehab (Currently) no  Medication History Cyclobenzaprine HCl (10MG  Tablet, Oral) Active. Diltiazem CD (240MG  Capsule ER 24HR, Oral) Active. Lisinopril (20MG  Tablet, Oral) Active. Percocet (10-325MG  Tablet, Oral) Active. Diazepam (10MG  Tablet, Oral) Active. Pantoprazole Sodium (40MG  Tablet DR, Oral) Active. Phentermine HCl (37.5MG  Capsule, Oral) Active. Albuterol Sulfate (Inhalation) Specific dose unknown - Active. Fish Oil Concentrate (Oral) Specific dose unknown - Active. Medications Reconciled  Past Surgical History Carpal Tunnel Repair bilateral Gallbladder Surgery laporoscopic  Other Problems  Anxiety Disorder Asthma Chronic Obstructive Lung Disease Chronic Pain Depression Fibromyalgia High blood pressure Other disease, cancer,  significant illness  Vitals 09/20/2014 9:48 AM Weight: 160 lb Height: 64in Body Surface Area: 1.78 m Body Mass Index: 27.46 kg/m  Temp.: 98.85F(Oral)  Pulse: 75 (Regular)  BP: 173/112 (Sitting, Right Arm, Standard)  Physical Exam  General Mental Status -Alert and cooperative.  Chest and Lung Exam Chest and lung exam reveals -normal excursion with symmetric chest walls.  Cardiovascular Examination of related systems reveals -well-developed, well-nourished and in no acute distress; alert and oriented x 3. Cardiovascular examination reveals -on palpation PMI is normal in location and amplitude, no palpable S3 or S4. Normal cardiac borders. and normal heart sounds, regular rate and rhythm with no murmurs. Palpation/Percussion Examination by palpation and percussion reveals - No Thrills.  Peripheral Vascular Lower Extremity Palpation - Pulses - Bilateral - pulses intact.  Neurologic Motor Strength - 4/5 reduced muscle strength - All Muscles. Lower Extremity - Bilateral - Motor function is diminished in the lower extremity. Reflexes 1/2 Decreased - All. Testing Hoffman's Sign - No Hoffman's sign present.  Musculoskeletal Spine/Ribs/Pelvis Lumbosacral Spine - Evaluation of related systems reveals - alert and oriented X3. Assessment of pain reveals the following findings - The pain is characterized as - severe, constant ache, pain on movement and shooting. Location - pain refers to lower back bilaterally. Lumbosacral Spine - ROM - painful. Waddell's Signs - no Waddell's signs present.  Plans Transcription At this point in time I have gone back over her imaging studies including a recent CT scan. That CT scan does show retropulsion of about 6 mm. There is some sclerosis along the superior end plate suggestive of healing but there is greater compromise compared to 06/25/14. My concern about doing just a straight forward kyphoplasty would be posterior cement because  of the posterior vertebral wall compromise.   At this point I think in order to repair instability to this area especially since it is at the  junction would be to do an L1 corpectomy with a cage reconstruction and then posterior pedicle screw fixation from T12 to L2. This will allow me to restore the normal lordosis to this area and stabilize it. I have explained the risks to the patient and her sister, these include infection, bleeding, nerve damage, death, stroke, paralysis, ongoing or worse pain, need for further surgery, it does not fuse, and the screws and rods of the cage could migrate or break. All of their questions were addressed. We will plan on surgery for the very near future.

## 2014-09-22 NOTE — Brief Op Note (Signed)
09/22/2014  6:27 PM  PATIENT:  [name]  49 y.o. female  PRE-OPERATIVE DIAGNOSIS:  L1 burst fracture  POST-OPERATIVE DIAGNOSIS:  L1 burst fracture  PROCEDURE:  Procedure(s): LATERAL L1 CORPECTOMY WITH T-11 - L3 FUSION AND POSTERIOR SPINAL FUSION INTERBODY T12 - L 2 2 LEVEL (N/A)  SURGEON:  Surgeon(s) and Role:    * Melina Schools, MD - Primary  PHYSICIAN ASSISTANT:   ASSISTANTS: none   ANESTHESIA:   general  EBL:  Total I/O In: 1252 [I.V.:4400; IV Piggyback:250] Out: 1800 [Urine:1200; Blood:600]  BLOOD ADMINISTERED:none  DRAINS: none   LOCAL MEDICATIONS USED:  MARCAINE     SPECIMEN:  No Specimen  DISPOSITION OF SPECIMEN:  N/A  COUNTS:  YES  TOURNIQUET:  * No tourniquets in log *  DICTATION: .Other Dictation: Dictation Number [id number]  PLAN OF CARE: Admit to inpatient   PATIENT DISPOSITION:  PACU - hemodynamically stable.  CXR - no ptx at completion of case

## 2014-09-22 NOTE — Anesthesia Preprocedure Evaluation (Signed)
Anesthesia Evaluation  Patient identified by MRN, date of birth, ID band Patient awake    Reviewed: Allergy & Precautions, NPO status , Patient's Chart, lab work & pertinent test results, Unable to perform ROS - Chart review only  Airway Mallampati: II  TM Distance: >3 FB Neck ROM: Full    Dental  (+) Teeth Intact, Dental Advisory Given   Pulmonary former smoker,  breath sounds clear to auscultation        Cardiovascular hypertension, Rhythm:Regular Rate:Normal     Neuro/Psych    GI/Hepatic   Endo/Other    Renal/GU      Musculoskeletal   Abdominal   Peds  Hematology   Anesthesia Other Findings   Reproductive/Obstetrics                             Anesthesia Physical Anesthesia Plan  ASA: II  Anesthesia Plan: General   Post-op Pain Management:    Induction: Intravenous  Airway Management Planned: Oral ETT  Additional Equipment:   Intra-op Plan:   Post-operative Plan: Extubation in OR  Informed Consent: I have reviewed the patients History and Physical, chart, labs and discussed the procedure including the risks, benefits and alternatives for the proposed anesthesia with the patient or authorized representative who has indicated his/her understanding and acceptance.   Dental advisory given  Plan Discussed with: CRNA and Anesthesiologist  Anesthesia Plan Comments: (L1 compression fracture H/O Esophageal stricture Hypertension Fibromyalgia Anxiety  Plan GA with oral ETT  Roberts Gaudy)        Anesthesia Quick Evaluation

## 2014-09-22 NOTE — Brief Op Note (Signed)
09/22/2014  6:11 PM  PATIENT:  [name]  49 y.o. female  PRE-OPERATIVE DIAGNOSIS:  L1 burst fracture  POST-OPERATIVE DIAGNOSIS:  L1 burst fracture  PROCEDURE:  Procedure(s): LATERAL L1 CORPECTOMY WITH T-12 - L2 FUSION AND POSTERIOR SPINAL FUSION INTERBODY T12 - L 2 2 LEVEL (N/A)  SURGEON:  Surgeon(s) and Role:    * Melina Schools, MD - Primary  PHYSICIAN ASSISTANT:   ASSISTANTS: none   ANESTHESIA:   general  EBL:  Total I/O In: 2297 [I.V.:4400; IV Piggyback:250] Out: 1800 [Urine:1200; Blood:600]  BLOOD ADMINISTERED:none  DRAINS: none   LOCAL MEDICATIONS USED:  MARCAINE     SPECIMEN:  No Specimen  DISPOSITION OF SPECIMEN:  N/A  COUNTS:  YES  TOURNIQUET:  * No tourniquets in log *  DICTATION: .Other Dictation: Dictation Number [id number]  PLAN OF CARE: Admit to inpatient   PATIENT DISPOSITION:  PACU - hemodynamically stable.

## 2014-09-22 NOTE — Progress Notes (Signed)
Patient having severe pain and anxiety post-operatively.  Orders received from Dr Kalman Shan for IV Versed 1-2 mg given as needed for anxiety and sedation.  Patient received total of 6 mg IV over course of an hour with transient improvement. Patient would not stay still  moving herself or asking for frequent positioning. Assisted in multiple position changes. Patient continued to have severe pain after receiving standard IV Fentanyl of  150 mcg in divided doses. . Patient had also received IV Toradol 30 mg, IV Tylenol 1 gm, IV Robaxin 500 mg, and PO Oxy IR 5 mg.  Patient continued to report severe pain. Updated Dr Kalman Shan and received orders to change narcotic to Dilaudid to be given in divided doses. Patient received total 3 mg IV Dilaudid.  Patient reported minor improvement after last dose of Dilaudid and verbalized readiness to go to room. Upon arrival to room @ 2130 patient resumed severe restlessness and inability  to lay comfortably in any position.  Report given to receiving RN.  Dr Rolena Infante paged per receiving RN to give update and request orders for pain management.

## 2014-09-22 NOTE — Anesthesia Postprocedure Evaluation (Signed)
  Anesthesia Post-op Note  Patient: [name]  Procedure(s) Performed: Procedure(s) (LRB): LATERAL L1 CORPECTOMY WITH T-11 - L3 FUSION AND POSTERIOR SPINAL FUSION INTERBODY T12 - L 2 2 LEVEL (N/A)  Patient Location: PACU  Anesthesia Type: General  Level of Consciousness: awake and alert   Airway and Oxygen Therapy: Patient Spontanous Breathing  Post-op Pain: mild  Post-op Assessment: Post-op Vital signs reviewed, Patient's Cardiovascular Status Stable, Respiratory Function Stable, Patent Airway and No signs of Nausea or vomiting  Last Vitals:  Filed Vitals:   09/22/14 1900  BP: 110/54  Pulse: 95  Temp:   Resp: 23    Post-op Vital Signs: stable   Complications: No apparent anesthesia complications

## 2014-09-22 NOTE — OR Nursing (Signed)
Chest X-ray done at completion of procedure and read by Dr. Ilda Foil radiologist. No pneumothorax was noted at that time.

## 2014-09-22 NOTE — Transfer of Care (Signed)
Immediate Anesthesia Transfer of Care Note  Patient: [name]  Procedure(s) Performed: Procedure(s): LATERAL L1 CORPECTOMY WITH T-11 - L3 FUSION AND POSTERIOR SPINAL FUSION INTERBODY T12 - L 2 2 LEVEL (N/A)  Patient Location: PACU  Anesthesia Type:General  Level of Consciousness: awake, alert  and oriented  Airway & Oxygen Therapy: Patient Spontanous Breathing and Patient connected to nasal cannula oxygen  Post-op Assessment: Report given to RN, Post -op Vital signs reviewed and stable and Patient moving all extremities X 4  Post vital signs: Reviewed and stable  Last Vitals:  Filed Vitals:   09/22/14 0909  BP: 170/110  Pulse: 81  Temp: 37.1 C  Resp: 18    Complications: No apparent anesthesia complications

## 2014-09-22 NOTE — Anesthesia Procedure Notes (Signed)
Procedure Name: Intubation Date/Time: 09/22/2014 10:07 AM Performed by: Ollen Bowl Pre-anesthesia Checklist: Patient identified, Emergency Drugs available, Suction available, Patient being monitored and Timeout performed Patient Re-evaluated:Patient Re-evaluated prior to inductionOxygen Delivery Method: Circle system utilized and Simple face mask Preoxygenation: Pre-oxygenation with 100% oxygen Intubation Type: IV induction Ventilation: Mask ventilation without difficulty Laryngoscope Size: Mac and 4 Grade View: Grade II Tube type: Oral Tube size: 7.5 mm Number of attempts: 1 Airway Equipment and Method: Patient positioned with wedge pillow and Stylet Placement Confirmation: ETT inserted through vocal cords under direct vision,  positive ETCO2 and breath sounds checked- equal and bilateral Secured at: 21 cm Tube secured with: Tape Dental Injury: Teeth and Oropharynx as per pre-operative assessment

## 2014-09-23 ENCOUNTER — Inpatient Hospital Stay (HOSPITAL_COMMUNITY): Payer: Medicaid Other

## 2014-09-23 MED ORDER — OXYCODONE HCL 5 MG PO TABS
15.0000 mg | ORAL_TABLET | ORAL | Status: DC | PRN
  Administered 2014-09-23 – 2014-09-26 (×15): 15 mg via ORAL
  Filled 2014-09-23 (×16): qty 3

## 2014-09-23 MED ORDER — DIAZEPAM 5 MG PO TABS
10.0000 mg | ORAL_TABLET | ORAL | Status: DC | PRN
Start: 2014-09-23 — End: 2014-09-26
  Administered 2014-09-23 – 2014-09-26 (×12): 10 mg via ORAL
  Filled 2014-09-23 (×12): qty 2

## 2014-09-23 MED ORDER — HYDROMORPHONE HCL 1 MG/ML IJ SOLN
1.0000 mg | INTRAMUSCULAR | Status: DC | PRN
  Administered 2014-09-23 (×3): 2 mg via INTRAVENOUS
  Administered 2014-09-23: 1 mg via INTRAVENOUS
  Administered 2014-09-23 – 2014-09-24 (×6): 2 mg via INTRAVENOUS
  Administered 2014-09-24: 1 mg via INTRAVENOUS
  Administered 2014-09-24 – 2014-09-25 (×5): 2 mg via INTRAVENOUS
  Filled 2014-09-23: qty 2
  Filled 2014-09-23: qty 1
  Filled 2014-09-23 (×14): qty 2

## 2014-09-23 MED ORDER — METOCLOPRAMIDE HCL 10 MG PO TABS
5.0000 mg | ORAL_TABLET | Freq: Three times a day (TID) | ORAL | Status: DC | PRN
  Administered 2014-09-23 (×2): 5 mg via ORAL
  Filled 2014-09-23 (×2): qty 1

## 2014-09-23 NOTE — Progress Notes (Signed)
Utilization Review Completed.Donne Anon T3/05/2015

## 2014-09-23 NOTE — Evaluation (Signed)
Occupational Therapy Evaluation Patient Details Name: [name] MRN: [id number] DOB: 1966/03/21 Today's Date: 09/23/2014    History of Present Illness Pt. admitted with history of L1 burst fracture (she reports she was bucked from a horse).  Pt underwent L1 lateral corpectomy and T12-L2 fusion.  Pt. with h/o anxiety, COPD , HTN    Clinical Impression   PTA pt lived at home and was independent with use of her back brace (lumbar corset). Pt has new TLSO (no leg component). Pt is limited by pain and decreased ROM and requires assistance for LB ADLs and Min A for functional mobility. Pt will benefit from acute OT to promote independence and facilitate return home with Supervision from family.     Follow Up Recommendations  No OT follow up;Supervision/Assistance - 24 hour    Equipment Recommendations  Tub/shower bench    Recommendations for Other Services       Precautions / Restrictions Precautions Precautions: Back Precaution Booklet Issued: Yes (comment) Precaution Comments: Educated pt and mother on back precautions and incorporating into ADLs.  Required Braces or Orthoses: Spinal Brace Spinal Brace: Thoracolumbosacral orthotic Spinal Brace Comments: new brace in room (TLSO).  Restrictions Weight Bearing Restrictions: No Other Position/Activity Restrictions: pt. reports she has not been able to lie on her back since surgery due to pain      Mobility Bed Mobility               General bed mobility comments: Pt sitting EOB with her mother when OT arrived, preparing to transfer to 3N1.   Transfers Overall transfer level: Needs assistance Equipment used: Rolling walker (2 wheeled) Transfers: Sit to/from [facility] Sit to Stand: Min assist Stand pivot transfers: Min assist       General transfer comment: VC's for hand placement. Min A to steady when powering up.          ADL Overall ADL's : Needs assistance/impaired Eating/Feeding:  Independent;Sitting   Grooming: Set up;Sitting   Upper Body Bathing: Set up;Sitting   Lower Body Bathing: Sit to/from stand;Moderate assistance   Upper Body Dressing : Maximal assistance;Sitting;Standing Upper Body Dressing Details (indicate cue type and reason): including back brace TLSO (No leg component) Lower Body Dressing: Maximal assistance;Sit to/from stand   Toilet Transfer: Minimal assistance;Ambulation;BSC;RW   Toileting- Clothing Manipulation and Hygiene: Minimal assistance;Sit to/from stand Toileting - Clothing Manipulation Details (indicate cue type and reason): Min A to stand. Pt able to wipe with sitting/lateral leans and pull up her underwear in standing.      Functional mobility during ADLs: Min guard;Rolling walker General ADL Comments: Educated pt on use of baby wipes and tongs for toilet hygiene. Discussed AE and will plan to educate during next session.                Pertinent Vitals/Pain Pain Assessment: 0-10 Pain Score: 8  Pain Location: low back Pain Descriptors / Indicators: Aching Pain Intervention(s): Limited activity within patient's tolerance;Monitored during session;Repositioned;Patient requesting pain meds-RN notified     Hand Dominance     Extremity/Trunk Assessment Upper Extremity Assessment Upper Extremity Assessment: Overall WFL for tasks assessed   Lower Extremity Assessment Lower Extremity Assessment: Overall WFL for tasks assessed   Cervical / Trunk Assessment Cervical / Trunk Assessment: Normal   Communication Communication Communication: No difficulties   Cognition Arousal/Alertness: Awake/alert Behavior During Therapy: Anxious;WFL for tasks assessed/performed Overall Cognitive Status: Within Functional Limits for tasks assessed  Home Living Family/patient expects to be discharged to:: Private residence Living Arrangements: Children;Spouse/significant other Available Help at  Discharge: Family;Available 24 hours/day Type of Home: House Home Access: Level entry     Home Layout: One level     Bathroom Shower/Tub: Tub/shower unit Shower/tub characteristics: Architectural technologist: Standard     Home Equipment: None          Prior Functioning/Environment Level of Independence: Independent (with use of brace (lumbar corset))             OT Diagnosis: Generalized weakness;Acute pain   OT Problem List: Decreased strength;Decreased range of motion;Decreased activity tolerance;Impaired balance (sitting and/or standing);Decreased safety awareness;Decreased knowledge of use of DME or AE;Decreased knowledge of precautions;Pain   OT Treatment/Interventions: Self-care/ADL training;Therapeutic exercise;Energy conservation;DME and/or AE instruction;Therapeutic activities;Patient/family education;Balance training    OT Goals(Current goals can be found in the care plan section) Acute Rehab OT Goals Patient Stated Goal: home after pain is controlled OT Goal Formulation: With patient Time For Goal Achievement: 10/07/14 Potential to Achieve Goals: Good  OT Frequency: Min 2X/week              End of Session Equipment Utilized During Treatment: Gait belt;Rolling walker;Back brace Nurse Communication: Patient requests pain meds  Activity Tolerance: Patient tolerated treatment well Patient left: in chair;with call bell/phone within reach;with family/visitor present   Time: 6213-0865 OT Time Calculation (min): 48 min Charges:  OT General Charges $OT Visit: 1 Procedure OT Evaluation $Initial OT Evaluation Tier I: 1 Procedure OT Treatments $Self Care/Home Management : 8-22 mins $Therapeutic Activity: 8-22 mins G-Codes:    Villa Herb M 09/26/14, 5:57 PM  Secundino Ginger Lynetta Mare, OTR/L Occupational Therapist [phone] (pager)

## 2014-09-23 NOTE — Evaluation (Signed)
Physical Therapy Evaluation Patient Details Name: [name] MRN: [id number] DOB: [birth year] Today's Date: 09/23/2014   History of Present Illness  Pt. admitted with histroy of L1 burst fracture (she reports she was bucked from a horse).  Pt. underwentL1 lateral corpectomy and T12-L2 fusion.  Pt. with h/o anxiety, COPD , HTN   Clinical Impression  Pt. Presents to PT s/p back fusion surgery for L1 burst fx.  Pt. Is quite anxious but responds well to slow,  step by step directions.  Pt. Had expected post op decreased in functional mobility and gait and will benefit from acute PT to address these and below issues.  As long as she progresses as I anticipate, I do not believe she will need HHPT but will update recommendations as indicated.    Follow Up Recommendations No PT follow up;Supervision/Assistance - 24 hour (as long as she progresses adequately, no PT)    Equipment Recommendations  Rolling walker with 5" wheels;3in1 (PT);Other (comment) (defer bathroom equipment to ot)    Recommendations for Other Services       Precautions / Restrictions Precautions Precautions: Back Precaution Booklet Issued: Yes (comment) Precaution Comments: Educated pt. and her sister on back precautions and log rolling technique and provided handout Required Braces or Orthoses: Spinal Brace Spinal Brace: Thoracolumbosacral orthotic;Other (comment) (not specified, presumed in sitting) Spinal Brace Comments: Pt. has an [employer] she received in December according to her husband.  Dr. Rolena Infante has ordered her a new TLSO which has not yet arrived .  She use her current TLSO during mobility today until new brace arrives Restrictions Weight Bearing Restrictions: No Other Position/Activity Restrictions: pt. reports she has not been able to lie on her back since surgery due to pain      Mobility  Bed Mobility Overal bed mobility: Needs Assistance;+2 for physical assistance Bed Mobility: Sidelying to Sit    Sidelying to sit: Mod assist;+2 for physical assistance       General bed mobility comments: Educated pt. on use of lower arm to assist in elevating her trunk.  Needed mod assist of 2 for elevation to sitting.  Instructed in log rolling technique for subsequent mobility with staff  Transfers Overall transfer level: Needs assistance Equipment used: Rolling walker (2 wheeled) Transfers: Sit to/from Stand Sit to Stand: Min assist;+2 physical assistance;+2 safety/equipment         General transfer comment: cues for maintaining erect trunk during transitions from sit<>stand; min assist to power up and to control descent  Ambulation/Gait Ambulation/Gait assistance: Min assist;+2 safety/equipment Ambulation Distance (Feet): 12 Feet Assistive device: Rolling walker (2 wheeled) Gait Pattern/deviations: Step-to pattern;Decreased step length - right;Decreased step length - left;Decreased stride length Gait velocity: decreased   General Gait Details: Pt. moves slowly and in guarded fashion but able to tolerate 12' with min assist for safety and stability; second person for equipment/recliner  Stairs            Wheelchair Mobility    Modified Rankin (Stroke Patients Only)       Balance Overall balance assessment: Needs assistance Sitting-balance support: No upper extremity supported;Feet supported Sitting balance-Leahy Scale: Good     Standing balance support: Bilateral upper extremity supported;During functional activity Standing balance-Leahy Scale: Poor Standing balance comment: needs support of RW for stability                             Pertinent Vitals/Pain Pain Assessment: 0-10 Pain Score:  7  Pain Location: low back Pain Descriptors / Indicators: Aching;Burning Pain Intervention(s): Limited activity within patient's tolerance;Monitored during session;Premedicated before session;Repositioned;Relaxation    Home Living Family/patient expects to be  discharged to:: Private residence Living Arrangements: Children;Spouse/significant other Available Help at Discharge: Family;Available 24 hours/day Type of Home: House Home Access: Level entry     Home Layout: One level Home Equipment: None      Prior Function Level of Independence: Independent with assistive device(s) (brace)               Hand Dominance        Extremity/Trunk Assessment   Upper Extremity Assessment: Overall WFL for tasks assessed           Lower Extremity Assessment: Overall WFL for tasks assessed      Cervical / Trunk Assessment: Normal  Communication   Communication: No difficulties  Cognition Arousal/Alertness: Awake/alert Behavior During Therapy: Anxious Overall Cognitive Status: Within Functional Limits for tasks assessed                      General Comments      Exercises        Assessment/Plan    PT Assessment Patient needs continued PT services  PT Diagnosis Difficulty walking;Acute pain   PT Problem List Decreased activity tolerance;Decreased balance;Decreased mobility;Decreased knowledge of use of DME;Decreased knowledge of precautions;Pain  PT Treatment Interventions DME instruction;Gait training;Functional mobility training;Therapeutic exercise;Balance training;Patient/family education   PT Goals (Current goals can be found in the Care Plan section) Acute Rehab PT Goals Patient Stated Goal:  home with assist of husband and pt' sister PT Goal Formulation: With patient Time For Goal Achievement: 09/30/14 Potential to Achieve Goals: Good    Frequency Min 6X/week   Barriers to discharge        Co-evaluation               End of Session Equipment Utilized During Treatment: Gait belt;Back brace Activity Tolerance: Patient tolerated treatment well Patient left: in chair;with call bell/phone within reach;with family/visitor present (pt's daughter and sister are with patient) Nurse Communication:  Mobility status         Time: [phone] PT Time Calculation (min) (ACUTE ONLY): 35 min   Charges:   PT Evaluation $Initial PT Evaluation Tier I: 1 Procedure PT Treatments $Gait Training: 8-22 mins   PT G CodesLadona Ridgel 09/23/2014, 10:08 AM Gerlean Ren PT Acute Rehab Services [phone] [place] [phone]

## 2014-09-23 NOTE — Progress Notes (Signed)
Orthopedic Tech Progress Note Patient Details:  [name] [birth year] [id number]  Patient ID: [name], female   DOB: [birth year], 49 y.o.   MRN: [id number] Called in bio-tech brace order; spoke with [name], [name] 09/23/2014, 9:07 AM

## 2014-09-23 NOTE — Progress Notes (Signed)
    Subjective: Procedure(s) (LRB): LATERAL L1 CORPECTOMY WITH T-11 - L3 FUSION AND POSTERIOR SPINAL FUSION INTERBODY T12 - L 2 2 LEVEL (N/A) 1 Day Post-Op  Patient reports pain as 8 on 0-10 scale.  Reports none leg pain reports incisional back pain   Foley in place Negative bowel movement Negative flatus Negative chest pain or shortness of breath - episode of chest pain overnight - resolved. No EKG changes  Objective: Vital signs in last 24 hours: Temp:  [97.6 F (36.4 C)-98.7 F (37.1 C)] 97.9 F (36.6 C) (03/11 0600) Pulse Rate:  [78-97] 94 (03/11 0600) Resp:  [14-27] 20 (03/11 0600) BP: (110-170)/(54-110) 152/81 mmHg (03/11 0600) SpO2:  [94 %-100 %] 100 % (03/11 0600) Weight:  [74.39 kg (164 lb)] 74.39 kg (164 lb) (03/10 0909)  Intake/Output from previous day: 03/10 0701 - 03/11 0700 In: 4650 [I.V.:4400; IV Piggyback:250] Out: 5000 [Urine:4400; Blood:600]  Labs:  Recent Labs  09/20/14 1258  WBC 6.3  RBC 4.16  HCT 39.5  PLT 272    Recent Labs  09/20/14 1258  NA 138  K 3.7  CL 105  CO2 30  BUN 10  CREATININE 0.66  GLUCOSE 116*  CALCIUM 8.9   No results for input(s): LABPT, INR in the last 72 hours.  Physical Exam: Neurologically intact Intact pulses distally Incision: dressing C/D/I Compartment soft  Assessment/Plan: Patient stable  xrays CXR - no change. No PTX Continue mobilization with physical therapy Continue care  Up with therapy  Expect slow progress due to pain Will sit up at bedside and possibly transfer to chair Remove foley Will order TLSO as her brace is in poor condition.  Melina Schools, MD [place] [facility] [phone]

## 2014-09-23 NOTE — Progress Notes (Signed)
Patient complains of chest pain; on call Merla Riches, PA contacted.  Orders for EKG and portable chest x-ray.  Will continue to monitor patient.

## 2014-09-23 NOTE — Progress Notes (Signed)
CARE MANAGEMENT NOTE 09/23/2014  Patient:  [name], [name]   Account Number:  [id number]  Date Initiated:  09/23/2014  Documentation initiated by:  [facility]  Subjective/Objective Assessment:   s/p L1 corpectomy, T11-L3 pedicle screw fixation, ORIF of burst fx     Action/Plan:   Pt/OT evals- recommended rolling walker, 3N1 and tub bench   Anticipated DC Date:  09/23/2013   Anticipated DC Plan:  [place]  CM consult      PAC Choice  DURABLE MEDICAL EQUIPMENT   Choice offered to / List presented to:     DME arranged  3-N-1  [place]      DME agency  [place].        Status of service:  Completed, signed off  Discharge Disposition:  HOME/SELF CARE  Per UR Regulation:  Reviewed for med. necessity/level of care/duration of stay  If discussed at [place] of Stay Meetings, dates discussed:    Comments:  09/22/14 Spoke with patient then contacted Pilar Plate with [employer] and requested rolling walker, 3N1 and tub bench be delivered to patient's room.

## 2014-09-23 NOTE — Progress Notes (Signed)
Patient refuses to have foley removed at this time, states she "can't roll onto a bedpan, she can barely roll in the bed without help."

## 2014-09-23 NOTE — Op Note (Signed)
[name], MARCEL NO.:  [id number]  MEDICAL RECORD NO.:  [id number]  LOCATION:  5N12C                        FACILITY:  [facility]  PHYSICIAN:  Dahlia Bailiff, MD    DATE OF BIRTH:  08-29-1965  DATE OF PROCEDURE:  09/22/2014 DATE OF DISCHARGE:                              OPERATIVE REPORT   PREOPERATIVE DIAGNOSIS:  L1 burst fracture.  POSTOPERATIVE DIAGNOSIS:  L1 burst fracture.  OPERATIVE PROCEDURE: 1. Lateral L1 corpectomy with cage reconstruction from T12-L2. 2. Posterior segmental instrumentation from T11-L3 with pedicle screw     fixation. 3. Open reduction and internal fixation of burst fracture.  COMPLICATIONS:  None.  INTRAOPERATIVE FINDINGS:  During the course of the lateral corpectomy, there was a tear to the pleura.  I spoke with Dr. Prescott Gum, Cardiothoracic surgeon, he advised to get a chest x-ray in between the anterior and posterior part and if there was a pneumothorax, he would place a chest tube.  The x-ray did not show any pneumothorax.  I did discuss the entire case with the radiologist.  Intraoperatively, I elected to proceed with the T11-L3 in stead of the plane T12-L2 instrumented fusion to provide greater fixation and stability.  The lumbar pedicles were hypoplastic and I could only put a 5.5 pedicle screw and I want greater stability to support the cage as well as provide fixation points.  As a result, I elected intraoperatively to extend the fusion one level up and one level down.  Instrumentation system used was the NuVasive Lateral Corpectomy expandable cage with NuVasive MIS pedicle screw fixation posteriorly.  COMPLICATIONS:  None.  HISTORY:  This is a very pleasant 49 year old woman who fell off her horse back in December and suffered an L1 burst fracture.  She was seen in an outside institution, placed in [place] and TLSO brace.  During the course of her healing, she continued to have horrific pain.  CT scan imaging  demonstrated further compromise and kyphosis at the fracture. Because of the progressive structural instability and pain, we elected to proceed with surgery.  All appropriate risks, benefits, and alternatives were discussed with the patient and consent was obtained.  OPERATIVE NOTE:  The patient was brought to the operating room, placed supine on the operating table.  After successful induction of general anesthesia and endotracheal intubation, TEDs, SCDs, and a Foley were inserted.  EMG needles were placed for intraoperative neuromonitoring. The patient was turned into the left lateral decubitus position with the right side down.  Axillary roll was placed.  The knees were padded, ankles were padded, all bony prominences were well padded.  Arms were placed overhead.  She was then secured with tape to the table.  The lateral aspect of the chest was then prepped and draped in a standard fashion.  Time-out was taken to confirm patient, procedure, and all other pertinent important data.  Once this was done, I identified the anterior and posterior margins of the L1 vertebral body and planned out our incision site.  I made a lateral incision after infiltrating it with 0.25% Marcaine, sharp dissection was carried out down to the rib.  I identified the rib, stripped the periosteum and gently  released the periosteum from the undersurface.  While I was releasing the undersurface of the pleura, there was a tear in the pleura.  Once I removed the rib, I was able to suture close the pleural defect.  I then made a counter incision about 1 fingerbreadth posteriorly which allowed me to get into the retropleural space and began my dissection.  Through the main incision, I palpated along the posterior peritoneum sweeping everything anterior.  I then identified the iliopsoas muscle. Retractors were placed and now I could directly visualize the fractured bone.  I placed my dilating tubes stimulating in a  circumferential fashion to confirm that there was no irritation or compression to the lumbar plexus.  I then secured my retractor in place and then confirmed its position in the AP and lateral planes.  I then placed the trocar into the disc space of T12-L1 to lock it in place.  An annulotomy was performed using a #10 blade scalpel.  Then using a combination of pituitary rongeurs, curettes, and Kerrison rongeurs, I removed all of the remaining disc material.  There was significant compromise at this level because of the fracture.  There was also destruction injury to the endplate of A19.  Once I had removed all this, I then repositioned my retractors to expose the L1-L2 level.  Using the same technique, I performed an annulotomy and resected all of the disc material from the lateral aspect down to the contralateral side.  Once I had the deep channels created, I then began my channel corpectomy using a large osteotome, I made 2 parallel cuts, 1 anterior and 1 posterior. This allowed me to piecemeal removed the fractured L1 vertebral body. Once I had this removed, I then irrigated the wound copiously with normal saline.  I made sure I had hemostasis.  I then trialed the corpectomy cage.  First, I placed the standard corpectomy cage that had 2 plates on either end; however, once it was placed, I noticed on the lateral view that it had tilted.  I then removed it and repositioned it. I then elected to remove the top portion and replace it with just a cap. This allowed me to position it into the vertebral body and got good fixation.  I then expanded it to the appropriate width.  I then back filled the cage with the remaining portion of autograft bone as well as DBX mix.  I then irrigated the wound copiously with normal saline and removed my retractors.  At this point, the x-rays were satisfactory. Given the pleural defect, I then went back and closed the area with interrupted #1 Vicryl sutures,  repairing the intercostal musculature. Once this was done, I then with interrupted #1 Vicryl suture then closed superficial 2-0 Vicryl sutures and 3-0 Monocryl for the skin.  I then irrigated out the second stab incision and closed this in a layered fashion with interrupted #1 Vicryl sutures, 2-0 Vicryl sutures, and 3-0 Monocryl.  At this point, I took final intraoperative x-rays which were satisfactory.  The drapes were removed, and she was placed supine on the surgical table.  Intraoperative x-ray demonstrated no pneumothorax.  As such, we proceeded with the surgery.  The patient was transferred to a prone position on a Jackson spine frame.  All bony prominences were well padded and the back was now prepped and draped in a standard fashion. Time-out was taken out again to confirm the patient, procedure, and operative plan.  Once this was done, I then  proceeded with the MIS pedicle screw fixation for supplementation.  My original plan was to do T12-L2.  However on inspecting the L2 pedicle, I noted they were slightly hypoplastic and so I had considered moving 1 level up and down for greater stability.  I also noted that there was some migration of the cage.  At this point, I did not think re-injuring the chest cavity, low lateral retroperitoneal approach.  I did not think going back after the cage in the lateral position was in her best interest given the duration of the case.  I felt if there would be additional pedicle screw fixation points, I could have greater stability.  As such, I identified the lateral border of the T11, T12, L2, and L3 pedicles.  Once I identified, I made stab incisions and then advanced a Jamshidi needle down to the lateral aspect of pedicle.  Using fluoro and EMG monitoring, I advanced the Jamshidi needle through the pedicle and into the vertebral body.  I confirmed trajectory in position in both the AP and lateral planes.  Once all 8 pedicles were cannulated, I  then tapped each 150.  T11 and T12 pedicles were quite normally developed and so I tapped these with 5.5 tap to place a 6.5 screw.  At L2 and L3 because of the hypoplastic nature of the pedicle, I tapped with a 4.5 tap for placing 5.5 screws.  Once all the pedicles were tapped, I then placed a 40 mm length screws 6.5 diameter at T11 and T12 and then 5.5 mm diameter 40 mm length at L2 and L3.  All screws were then directly stimulated and there was no electrodiagnostic evidence of breach.  All screws had excellent purchase.  I then contoured 2 rods, both proximally 130 mm in length and then advanced them percutaneously across the pedicle screws.  I then locked them proximally and then performed a gentle compression maneuver. Once this was done, I locked them distally.  All locking caps were then torqued off according to manufacturers standards.  At this point, final x-rays showed that the cage was stable, the posterior pedicle screws were stable.  I was pleased with the fixation.  I then irrigated all of the MIS incision sites with normal saline, and then closed each in a layered fashion with interrupted #1 Vicryl suture, 2-0 Vicryl sutures, and 3-0 Monocryl.  Steri-Strips and dry dressing were applied and fresh dry dressings were applied to all wounds.  A second x-ray was taken and read by the radiologist, results are pending.  If there is no pneumothorax, then the plan will be to extubate and transfer to PACU. If there is a pneumothorax, I will contact Dr. Prescott Gum for placement of a chest tube.  At the end of the case, all needle and sponge counts were correct.     Dahlia Bailiff, MD     DDB/MEDQ  D:  09/22/2014  T:  09/23/2014  Job:  [id number]

## 2014-09-24 MED ORDER — POLYETHYLENE GLYCOL 3350 17 G PO PACK
17.0000 g | PACK | Freq: Every day | ORAL | Status: DC
  Administered 2014-09-24 – 2014-09-26 (×3): 17 g via ORAL
  Filled 2014-09-24 (×3): qty 1

## 2014-09-24 MED ORDER — FLEET ENEMA 7-19 GM/118ML RE ENEM
1.0000 | ENEMA | Freq: Every day | RECTAL | Status: DC | PRN
  Filled 2014-09-24: qty 1

## 2014-09-24 MED ORDER — BISACODYL 10 MG RE SUPP
10.0000 mg | Freq: Every day | RECTAL | Status: DC | PRN
Start: 2014-09-24 — End: 2014-09-26
  Administered 2014-09-24: 10 mg via RECTAL
  Filled 2014-09-24: qty 1

## 2014-09-24 NOTE — Progress Notes (Signed)
OT Cancellation Note  Patient Details Name: [name] MRN: [id number] DOB: [birth year]   Cancelled Treatment:    Reason Eval/Treat Not Completed: Other (comment). Pt sleeping soundly and mother requesting OT to come back.  Benito Mccreedy OTR/L [phone]  09/24/2014, 2:19 PM

## 2014-09-24 NOTE — Progress Notes (Signed)
Patient crying in pain.Rated pain as 10/10.  PRN dilaudid 2mg  given. Patient helped getting out of the bed and use BSC. Patient did ambulate on the hallway for awhile with this nurse supervising her. Tolerated well and verbalized relief from pain rated as 6/10. Placed patient back on bed sitting with spinal brace on. Will continue to monitor.

## 2014-09-24 NOTE — Progress Notes (Signed)
Patient woke up in severe pain, anxious and crying requesting for her breathing treatment. No respiratory distress noted.  OxyIR 15mg , valium10mg  and breathing treatment given. Will continue to monitor.

## 2014-09-24 NOTE — Progress Notes (Addendum)
As Roland Rack, RN was giving me beginning of shift report, patient walked into hallway by herself with walker. Patient asked for assistance getting into bed. Constance and I walked with patient back to her bed and helped patient to safely sit on side of bed. Then patient said she needed her back brace off so I un-velcroed one side of the back brace. I did this gently and safely. Then, [facility] and I assisted patient onto the bed. Roland Rack helped with patient's upper body and, per patient's request, I helped lift patient's knees. (At first, I touched patient's ankle's because I was going to help lift her legs onto the bed by the bottom of her legs. Then patient requested that I lift her knees, not her ankles, so I put my legs under her knees instead. Then we asked patient to help [place] get her into bed by helping lift her legs as much as she can. I stated that I can help her lift her knees but that I cannot lift them both by myself without her help. Then patient helped lift some of her leg weight and Constance and I helped patient into the bed while I helped lift her legs by holding under her knees.) Then, I asked patient how she would like to be positioned and she said that she didn't know but was in pain. So, I suggested we center patient in bed. Patient did not object, but agreed to this so [facility] and I used draw sheet to slide patient towards center of bed. Then, patient said she was really hungry and wanted to eat. Constance and I told her she needed to be pulled up in bed so that she can sit up to eat. Patient said that was fine. So, Constance lowered the head of the bed to flatten the bed. Then, we each took a side of the draw sheet and gently pulled the patient up in bed. Afterwards, we told patient we needed to raise the head of the bed so she could eat. Patient said "ok." Then, as head was raising, patient complained of back pain from the movement. We stopped raising the head of the bed and asked patient  how she wanted to be positioned. Patient said she didn't know and that she was in pain but still wanted to eat. We suggested raising the head a little more and educated patient that head of bed needed to be raised so that she would not choke as she ate her food. She verbalized understanding and said that was fine so we started to raise the head of the bed some more. Then patient asked [place] to stop again due to pain. We stopped raising the head of the bed any further (bed was raised about 50 degrees). Then we made sure the call-light was close by and in the bed. I told patient that I needed to finish getting report from [place] and that I would bring her pain medicine as soon as I was finished. I kept my word and brought her dilaudid and oxycodone soon after. When I brought patient pain medication, she told me that the reason she was in so much pain was because we "threw her around." I apologized to patient that she was having so much pain and told her that we did not intend to hurt her but were merely trying to help her get comfortable in bed and in a position where she could eat safely. Patient said that my "apologies don't matter." I notified Kenney Houseman, charge nurse  that patient was unhappy with her care and she spoke with patient and patient's family (who had just arrived).  Patient then made lots of false accusations about Roland Rack and me. Patient stated that I "ripped the brace" off of her, was rough, and that she did not ask me to take the brace off.  Patient also stated that I did not do as she asked, but that I lifted her legs by her ankles instead of her knees. Patient stated that she asked me to lift her legs by her knees and that I told her I could not do that, which is not the case. Patient stated that she thinks I " took staples out of her back" by "jerking her around." Patient also stated that we did not leave her call bell where she could reach it, which is not true. Patient also stated to [facility],  charge nurse and me that [place] and I should not have raised the head of her bed and that she cannot sit up straight. While patient is telling this to [facility] and me, she is sitting straight up independently on the side of the bed. Tonya attempted to resolve the issue by educating patient on realistic pain expectations and mobility techniques and by transferring nursing care to [place], [place].

## 2014-09-24 NOTE — Clinical Social Work Note (Signed)
CSW received consult for skilled nursing placement. CSW reviewed patient's chart and PT recommendation for no PT follow-up and OT recommendation for no OT follow-up. No further needs. CSW signing off.  [place], [place] Weekend Clinical Social Worker [phone]

## 2014-09-24 NOTE — Progress Notes (Signed)
   Subjective: 2 Days Post-Op Procedure(s) (LRB): LATERAL L1 CORPECTOMY WITH T-11 - L3 FUSION AND POSTERIOR SPINAL FUSION INTERBODY T12 - L 2 2 LEVEL (N/A) Patient reports pain as severe.   Patient seen in rounds with Dr. Gladstone Lighter. Patient is having some issues with constipation. She reports that she is voiding well. Positive flatus. She reports that she is having a lot of pain in her back but has been up walking. Denies chest pain and SOB currently.   Objective: Vital signs in last 24 hours: Temp:  [97.7 F (36.5 C)-98.4 F (36.9 C)] 97.7 F (36.5 C) (03/12 0530) Pulse Rate:  [99-115] 99 (03/12 0530) Resp:  [18-20] 18 (03/12 0530) BP: (116-136)/(63-78) 118/77 mmHg (03/12 0530) SpO2:  [97 %-100 %] 97 % (03/12 0530)  Intake/Output from previous day:  Intake/Output Summary (Last 24 hours) at 09/24/14 0802 Last data filed at 09/23/14 1700  Gross per 24 hour  Intake    480 ml  Output   2600 ml  Net  -2120 ml      EXAM General - Patient is Alert and Oriented Extremity - Neurologically intact Intact pulses distally Dorsiflexion/Plantar flexion intact Dressing/Incision - clean, dry, no drainage Motor Function - intact, moving foot and toes well on exam.   Past Medical History  Diagnosis Date  . Asthma   . COPD (chronic obstructive pulmonary disease)   . Hypertension   . GERD (gastroesophageal reflux disease)   . Anxiety disorder   . Chronic pain syndrome   . Anxiety   . Depression   . Fibromyalgia   . Epigastric pain 03/29/2013  . Fatigue 03/29/2013  . Shortness of breath dyspnea     Assessment/Plan: 2 Days Post-Op Procedure(s) (LRB): LATERAL L1 CORPECTOMY WITH T-11 - L3 FUSION AND POSTERIOR SPINAL FUSION INTERBODY T12 - L 2 2 LEVEL (N/A) Active Problems:   Burst fracture of lumbar vertebra  Estimated body mass index is 28.14 kg/(m^2) as calculated from the following:   Height as of 09/20/14: 5\' 4"  (1.626 m).   Weight as of this encounter: 74.39 kg (164  lb).   WBAT. Walk with walker. Change to clear liquid diet until BM. Add bowel management including miralax, dulcolax, and enema. Proceed with these as needed. Continue with plan for DC Monday.  Ardeen Jourdain, PA-C Orthopaedic Surgery 09/24/2014, 8:02 AM

## 2014-09-24 NOTE — Progress Notes (Signed)
Occupational Therapy Treatment Patient Details Name: [name] MRN: [id number] DOB: September 08, 1965 Today's Date: 09/24/2014    History of present illness Pt. admitted with history of L1 burst fracture (she reports she was bucked from a horse).  Pt underwent L1 lateral corpectomy and T12-L2 fusion.  Pt. with h/o anxiety, COPD , HTN    OT comments  Pt progressing. Plan to practice with AE next session and perform tub transfer.  Follow Up Recommendations  No OT follow up;Supervision/Assistance - 24 hour    Equipment Recommendations  Tub/shower bench    Recommendations for Other Services      Precautions / Restrictions Precautions Precautions: Back Precaution Booklet Issued: No Precaution Comments: reviewed back precautions Required Braces or Orthoses: Spinal Brace Spinal Brace: Thoracolumbosacral orthotic;Applied in sitting position (adjusted in standing) Restrictions Weight Bearing Restrictions: No       Mobility Bed Mobility Overal bed mobility: Needs Assistance Bed Mobility: Sidelying to Sit   Sidelying to sit: Min assist       General bed mobility comments: assist to scoot hips out to edge, but then able to go from sidelying to sitting position.   Transfers Overall transfer level: Needs assistance Equipment used: Rolling walker (2 wheeled) Transfers: Sit to/from Stand Sit to Stand: Min guard         General transfer comment: Min guard for safety/cues for technique.    Balance  Min guard-Min A for ambulation with RW.                                 ADL Overall ADL's : Needs assistance/impaired     Grooming: Applying deodorant;Oral care;Wash/dry face;Set up;Supervision/safety;Standing                   Toilet Transfer: Minimal assistance;RW;Ambulation;Min guard (3 in 1 over commode)   Toileting- Clothing Manipulation and Hygiene: Minimal assistance;Min guard;Sit to/from stand (performed twice and did better second time)        Functional mobility during ADLs: Min guard;Minimal assistance;Rolling walker General ADL Comments: Explained benefit of moving. Educated on use of cup for oral care and placement of grooming items to avoid breaking precautions. Discussed incorporating back precautions into functional activities. Educated on AE and gave pt kit. Educated on safety such as safe shoewear, use of bag on walker, rugs/items on floor. Educated on back brace. Pt requiring cues to stay on task as she was perseverating on situation with staff earlier jerking her around.   Educated on 3 in 1.      Vision                     Perception     Praxis      Cognition  Awake/Alert Behavior During Therapy: WFL for tasks assessed/performed Overall Cognitive Status: unsure of baseline-  (pt upset/perseverating on incident with nursing this morning requiring cues to stay on task and to keep positive/encouragement given)                       Extremity/Trunk Assessment               Exercises     Shoulder Instructions       General Comments      Pertinent Vitals/ Pain       Pain Assessment: 0-10 Pain Score: 10-Worst pain ever Pain Location: back Pain Intervention(s): Monitored during session;Repositioned;Limited activity within patient's  tolerance  Home Living                                          Prior Functioning/Environment              Frequency Min 2X/week     Progress Toward Goals  OT Goals(current goals can now be found in the care plan section)  Progress towards OT goals: Progressing toward goals  Acute Rehab OT Goals Patient Stated Goal: go home  OT Goal Formulation: With patient Time For Goal Achievement: 10/07/14 Potential to Achieve Goals: Good ADL Goals Pt Will Perform Grooming: with modified independence;standing Pt Will Perform Lower Body Dressing: with modified independence;with adaptive equipment;sit to/from stand Pt Will Transfer to  Toilet:  (3 in 1 over commode) Pt Will Perform Toileting - Clothing Manipulation and hygiene: with modified independence;sit to/from stand Pt Will Perform Tub/Shower Transfer: Tub transfer;with supervision;ambulating;tub bench;rolling walker Additional ADL Goal #1: Pt/caregiver will independently don/doff back brace.  Plan Discharge plan remains appropriate    Co-evaluation                 End of Session Equipment Utilized During Treatment: Gait belt;Rolling walker;Back brace   Activity Tolerance Patient limited by pain   Patient Left in chair;with call bell/phone within reach;with family/visitor present   Nurse Communication          Time: 0298-4730 OT Time Calculation (min): 35 min  Charges: OT General Charges $OT Visit: 1 Procedure OT Treatments $Self Care/Home Management : 23-37 mins  Benito Mccreedy OTR/L [phone] 09/24/2014, 5:42 PM

## 2014-09-24 NOTE — Progress Notes (Signed)
Physical Therapy Treatment Patient Details Name: [name] MRN: [id number] DOB: 11/09/1965 Today's Date: 09/24/2014    History of Present Illness Pt. admitted with history of L1 burst fracture (she reports she was bucked from a horse).  Pt underwent L1 lateral corpectomy and T12-L2 fusion.  Pt. with h/o anxiety, COPD , HTN     PT Comments    Pt. Upset this am at what she perceived at nursing  staff "jerking her around".  t I "empathized" with patient on her earlier experiences  And apologized for what she had experienced.  He mood and demeanor changed positively and she was smiling and stated all staff had been "tremendous" except this am nursing staff.  Pt. Is progressing beautifully in PT and was able to ambulate 180' at min guard level of assist.  Will continue with PT POC tomorrow.  Encouraged pt. To be up for all her meals with rest breaks in between.   Pt. Plans to use a hospital bed at home.  I have educated her to spend adequate time out of bed multiple times per day and not to lie in bed all day.  She is agreeable.   Follow Up Recommendations  No PT follow up;Supervision/Assistance - 24 hour     Equipment Recommendations  Rolling walker with 5" wheels;3in1 (PT);Other (comment)    Recommendations for Other Services       Precautions / Restrictions Precautions Precautions: Back Precaution Comments: reviewed back precautions; good carry over with no twisting precaution Required Braces or Orthoses: Spinal Brace Spinal Brace: Thoracolumbosacral orthotic;Applied in sitting position Spinal Brace Comments: new brace in room (TLSO).  Restrictions Weight Bearing Restrictions: No    Mobility  Bed Mobility Overal bed mobility: Needs Assistance Bed Mobility: Sidelying to Sit   Sidelying to sit: Min guard       General bed mobility comments: pt. able to demonstrate safe and correct sidelying to sit technique with only min guard assist, no physical assist needed, and with  heavy use of bedrail (pt. reports she has access to hospital bed from a family member and that her husband is picking it up today for her to use)  Transfers Overall transfer level: Needs assistance Equipment used: Rolling walker (2 wheeled) Transfers: Sit to/from Stand Sit to Stand: Min guard         General transfer comment: min guard assist for safety ; no physical assist needed to power up; cues to straighten knees and stand erect  Ambulation/Gait Ambulation/Gait assistance: Min guard;+2 safety/equipment Ambulation Distance (Feet): 180 Feet Assistive device: Rolling walker (2 wheeled) Gait Pattern/deviations: Step-through pattern;Decreased stride length Gait velocity: decreased   General Gait Details: Pt. with improved sequencing and fluidity of gait; needs increased time due to slow pace.   Stairs            Wheelchair Mobility    Modified Rankin (Stroke Patients Only)       Balance                                    Cognition Arousal/Alertness: Awake/alert Behavior During Therapy: WFL for tasks assessed/performed (frustrated at earlier staff "jerking " her around) Overall Cognitive Status: Within Functional Limits for tasks assessed                      Exercises      General Comments  Pertinent Vitals/Pain Pain Assessment: 0-10 Pain Score: 3  Pain Location: back and ribs Pain Descriptors / Indicators: Sore Pain Intervention(s): Monitored during session;Limited activity within patient's tolerance;Repositioned;Relaxation    Home Living                      Prior Function            PT Goals (current goals can now be found in the care plan section) Progress towards PT goals: Progressing toward goals    Frequency  Min 6X/week    PT Plan Current plan remains appropriate    Co-evaluation             End of Session Equipment Utilized During Treatment: Gait belt;Back brace Activity Tolerance:  Patient tolerated treatment well Patient left: in chair;with call bell/phone within reach;with family/visitor present     Time: 1005-1039 PT Time Calculation (min) (ACUTE ONLY): 34 min  Charges:  $Gait Training: 23-37 mins                    G CodesLadona Ridgel 09/24/2014, 10:56 AM Gerlean Ren PT Acute Rehab Services [phone] Bee[place] [phone]

## 2014-09-25 MED ORDER — MAGNESIUM CITRATE PO SOLN
1.0000 | Freq: Once | ORAL | Status: AC
  Administered 2014-09-25: 1 via ORAL
  Filled 2014-09-25: qty 296

## 2014-09-25 MED ORDER — OXYCODONE HCL ER 10 MG PO T12A
10.0000 mg | EXTENDED_RELEASE_TABLET | Freq: Two times a day (BID) | ORAL | Status: DC
  Administered 2014-09-25 – 2014-09-26 (×3): 10 mg via ORAL
  Filled 2014-09-25 (×3): qty 1

## 2014-09-25 NOTE — Progress Notes (Signed)
Physical Therapy Treatment Patient Details Name: [name] MRN: [id number] DOB: 1965-07-22 Today's Date: 09/25/2014    History of Present Illness Pt. admitted with history of L1 burst fracture (she reports she was bucked from a horse).  Pt underwent L1 lateral corpectomy and T12-L2 fusion.  Pt. with h/o anxiety, COPD , HTN     PT Comments    Continuing progress, though very anxious entire session; very much wants to move her bowels; REquires cues to attend to task at hand  Follow Up Recommendations  No PT follow up;Supervision/Assistance - 24 hour     Equipment Recommendations  Rolling walker with 5" wheels;3in1 (PT);Other (comment)    Recommendations for Other Services       Precautions / Restrictions Precautions Precautions: Back Precaution Booklet Issued: No Precaution Comments: reviewed back precautions Required Braces or Orthoses: Spinal Brace Spinal Brace: Thoracolumbosacral orthotic;Applied in sitting position (adjusted in standing) Restrictions Weight Bearing Restrictions: No    Mobility  Bed Mobility Overal bed mobility: Needs Assistance Bed Mobility: Rolling;Sidelying to Sit Rolling: Min assist Sidelying to sit: Min guard       General bed mobility comments: cues for technique.  Transfers Overall transfer level: Needs assistance Equipment used: Rolling walker (2 wheeled) Transfers: Sit to/from Stand Sit to Stand: Min guard         General transfer comment: cues for technique.  Ambulation/Gait Ambulation/Gait assistance: Min guard Ambulation Distance (Feet): 220 Feet Assistive device: Rolling walker (2 wheeled) Gait Pattern/deviations: Step-through pattern Gait velocity: decreased   General Gait Details: Pt. with improved sequencing and fluidity of gait; needs increased time due to slow pace.   Stairs Stairs: Yes Stairs assistance: Min guard Stair Management: No rails;With walker;Forwards;Step to pattern Number of Stairs: 1 General  stair comments: step-by-step cues for technique  Wheelchair Mobility    Modified Rankin (Stroke Patients Only)       Balance                                    Cognition Arousal/Alertness: Awake/alert Behavior During Therapy: Anxious Overall Cognitive Status: Within Functional Limits for tasks assessed                      Exercises      General Comments        Pertinent Vitals/Pain Pain Assessment: 0-10 Pain Score: 6  Pain Location: back Pain Descriptors / Indicators: Aching;Sore Pain Intervention(s): Limited activity within patient's tolerance;Monitored during session;Repositioned (instructed pt to give more support by pushing down into RW)    Home Living                      Prior Function            PT Goals (current goals can now be found in the care plan section) Acute Rehab PT Goals Patient Stated Goal: not stated PT Goal Formulation: With patient Time For Goal Achievement: 09/30/14 Potential to Achieve Goals: Good Progress towards PT goals: Progressing toward goals    Frequency  Min 6X/week    PT Plan Current plan remains appropriate    Co-evaluation             End of Session Equipment Utilized During Treatment: Back brace Activity Tolerance: Patient tolerated treatment well Patient left: in chair;with call bell/phone within reach     Time: 0762-2633 PT Time Calculation (min) (ACUTE ONLY):  51 min  Charges:  $Gait Training: 23-37 mins $Therapeutic Activity: 8-22 mins                    G Codes:      Quin Hoop 09/25/2014, 5:24 PM  Roney Marion, [place] Pager [phone] Office [phone]

## 2014-09-25 NOTE — Progress Notes (Signed)
Subjective: 3 Days Post-Op Procedure(s) (LRB): LATERAL L1 CORPECTOMY WITH T-11 - L3 FUSION AND POSTERIOR SPINAL FUSION INTERBODY T12 - L 2 2 LEVEL (N/A) Patient reports pain as 5 on 0-10 scale.  Complain of back pain,appropriate.  Objective: Vital signs in last 24 hours: Temp:  [98.5 F (36.9 C)-98.7 F (37.1 C)] 98.5 F (36.9 C) (03/13 0531) Pulse Rate:  [76-96] 76 (03/13 0531) Resp:  [18] 18 (03/13 0531) BP: (110-147)/(58-86) 147/78 mmHg (03/13 1056) SpO2:  [96 %-100 %] 98 % (03/13 0950)  Intake/Output from previous day: 03/12 0701 - 03/13 0700 In: 360 [P.O.:360] Out: -  Intake/Output this shift: Total I/O In: 240 [P.O.:240] Out: -   No results for input(s): HGB in the last 72 hours. No results for input(s): WBC, RBC, HCT, PLT in the last 72 hours. No results for input(s): NA, K, CL, CO2, BUN, CREATININE, GLUCOSE, CALCIUM in the last 72 hours. No results for input(s): LABPT, INR in the last 72 hours.  Incision: no drainage dressings changed  Looks excellent. Ambulating well with brace and walker. Getting up and down is the hard part for her. Voiding well.  Assessment/Plan: 3 Days Post-Op Procedure(s) (LRB): LATERAL L1 CORPECTOMY WITH T-11 - L3 FUSION AND POSTERIOR SPINAL FUSION INTERBODY T12 - L 2 2 LEVEL (N/A) Up with therapy Continue current treatment.  [name] [name] 09/25/2014, 11:39 AM

## 2014-09-25 NOTE — Progress Notes (Addendum)
Occupational Therapy Treatment Patient Details Name: [name] MRN: [id number] DOB: February 06, 1966 Today's Date: 09/25/2014    History of present illness Pt. admitted with history of L1 burst fracture (she reports she was bucked from a horse).  Pt underwent L1 lateral corpectomy and T12-L2 fusion.  Pt. with h/o anxiety, COPD , HTN    OT comments  Pt progressing. Plan to practice tub transfer with tub bench next session and answer any questions she or family may have.   Follow Up Recommendations  No OT follow up;Supervision/Assistance - 24 hour    Equipment Recommendations  Tub/shower bench    Recommendations for Other Services      Precautions / Restrictions Precautions Precautions: Back Precaution Booklet Issued: No Precaution Comments: reviewed back precautions Required Braces or Orthoses: Spinal Brace Spinal Brace: Thoracolumbosacral orthotic;Applied in sitting position (adjusted in standing) Restrictions Weight Bearing Restrictions: No       Mobility Bed Mobility Overal bed mobility: Needs Assistance Bed Mobility: Rolling;Sidelying to Sit Rolling: Mod assist Sidelying to sit: Min guard       General bed mobility comments: cues for technique.  Transfers Overall transfer level: Needs assistance Equipment used: Rolling walker (2 wheeled) Transfers: Sit to/from Stand Sit to Stand: Min guard         General transfer comment: cues for technique.    Balance    Min guard for ambulation. Cues to relax shoulders and push walker further in front of her/bigger steps.                               ADL Overall ADL's : Needs assistance/impaired     Grooming: Wash/dry hands;Oral care;Supervision/safety;Standing;Set up       Lower Body Bathing: With adaptive equipment;Moderate assistance (washed peri area/buttocks)   Upper Body Dressing : Sitting;Standing;Minimal assistance (mom practiced with back brace-OT adjusted)   Lower Body Dressing: Min  guard;Sit to/from stand;With adaptive equipment (panties and socks)   Toilet Transfer: Min guard;RW;Ambulation (3 in 1 over commode)   Toileting- Clothing Manipulation and Hygiene: Sit to/from stand;Minimal assistance (cleaned after urination; simulated wiping buttocks and unable to fully reach buttocks for hygiene)       Functional mobility during ADLs: Min guard;Rolling walker (ambulated in hallway as well) General ADL Comments: Educated on AE including toilet aide and gave pt toilet aide. Pt practiced with AE. Reviewed to use cup for oral care and placement of grooming items to avoid breaking precautions. Educated on safety such as use of bag on walker, rug/items on floor, and recommended someone be with her for tub transfer. Educated on tub transfer/car transfer technique and plan to practice next session. Educated on back brace information. Educated on positioning of pillows. Explained benefits of moving.       Vision                     Perception     Praxis      Cognition  Awake/Alert Behavior During Therapy: Anxious Overall Cognitive Status: Within Functional Limits for tasks assessed                       Extremity/Trunk Assessment               Exercises     Shoulder Instructions       General Comments      Pertinent Vitals/ Pain       Pain  Assessment: 0-10 Pain Score: 10-Worst pain ever Pain Location: back Pain Descriptors / Indicators: Sore (stiffness) Pain Intervention(s): Monitored during session;Repositioned; Relaxation  Home Living                                          Prior Functioning/Environment              Frequency Min 2X/week     Progress Toward Goals  OT Goals(current goals can now be found in the care plan section)  Progress towards OT goals: Progressing toward goals  Acute Rehab OT Goals Patient Stated Goal: not stated OT Goal Formulation: With patient Time For Goal Achievement:  10/07/14 Potential to Achieve Goals: Good ADL Goals Pt Will Perform Grooming: with modified independence;standing Pt Will Perform Lower Body Dressing: with modified independence;with adaptive equipment;sit to/from stand Pt Will Transfer to Toilet: with modified independence;ambulating (3 in 1 over commode) Pt Will Perform Toileting - Clothing Manipulation and hygiene: with modified independence;sit to/from stand Pt Will Perform Tub/Shower Transfer: Tub transfer;with supervision;ambulating;tub bench;rolling walker Additional ADL Goal #1: Pt/caregiver will independently don/doff back brace.  Plan Discharge plan remains appropriate    Co-evaluation                 End of Session Equipment Utilized During Treatment: Gait belt;Rolling walker;Back brace   Activity Tolerance Patient tolerated treatment well   Patient Left in chair;with call bell/phone within reach;with family/visitor present   Nurse Communication          Time: 1000-1050 OT Time Calculation (min): 50 min  Charges: OT General Charges $OT Visit: 1 Procedure OT Treatments $Self Care/Home Management : 23-37 mins $Therapeutic Activity: 8-22 mins  Benito Mccreedy OTR/L [phone] 09/25/2014, 12:11 PM

## 2014-09-26 MED ORDER — OXYCODONE HCL ER 10 MG PO T12A: 10.0000 mg | EXTENDED_RELEASE_TABLET | Freq: Two times a day (BID) | ORAL | Status: DC

## 2014-09-26 MED ORDER — DIAZEPAM 10 MG PO TABS: 10.0000 mg | ORAL_TABLET | Freq: Three times a day (TID) | ORAL | Status: DC | PRN

## 2014-09-26 MED ORDER — MAGNESIUM CITRATE PO SOLN: 1.0000 | Freq: Once | ORAL | Status: DC

## 2014-09-26 NOTE — Progress Notes (Signed)
    Subjective: Procedure(s) (LRB): LATERAL L1 CORPECTOMY WITH T-11 - L3 FUSION AND POSTERIOR SPINAL FUSION INTERBODY T12 - L 2 2 LEVEL (N/A) 4 Days Post-Op  Patient reports pain as 4 on 0-10 scale.  Reports decreased leg pain reports incisional back pain   Positive void Positive bowel movement Positive flatus Negative chest pain or shortness of breath  Objective: Vital signs in last 24 hours: Temp:  [97.8 F (36.6 C)-98.9 F (37.2 C)] 97.8 F (36.6 C) (03/14 0432) Pulse Rate:  [77-81] 79 (03/14 0432) Resp:  [16] 16 (03/14 0432) BP: (111-148)/(67-78) 148/69 mmHg (03/14 0432) SpO2:  [97 %-100 %] 97 % (03/14 0432)  Intake/Output from previous day: 03/13 0701 - 03/14 0700 In: 840 [P.O.:840] Out: -   Labs: No results for input(s): WBC, RBC, HCT, PLT in the last 72 hours. No results for input(s): NA, K, CL, CO2, BUN, CREATININE, GLUCOSE, CALCIUM in the last 72 hours. No results for input(s): LABPT, INR in the last 72 hours.  Physical Exam: Neurologically intact ABD soft Neurovascular intact Incision: dressing C/D/I and no drainage Compartment soft No rebound tenderness or distention of abdomen    Assessment/Plan: Patient stable  xrays n/a Continue mobilization with physical therapy Continue care  Up with therapy  Doing well Improved pain with large BM No incontinence Ambulating well Ok for d/c to home  Melina Schools, MD [place] [phone]

## 2014-09-26 NOTE — Progress Notes (Addendum)
Occupational Therapy Treatment Patient Details Name: [name] MRN: [id number] DOB: 19-May-1966 Today's Date: 09/26/2014    History of present illness Pt. admitted with history of L1 burst fracture (she reports she was bucked from a horse).  Pt underwent L1 lateral corpectomy and T12-L2 fusion.  Pt. with h/o anxiety, COPD , HTN    OT comments  Pt. Progressing well with acute OT goals.  Has strong family support and all DME needed for d/c and is clear from our standpoint to d/c from OT and home when able.    Follow Up Recommendations  No OT follow up;Supervision/Assistance - 24 hour    Equipment Recommendations  Tub/shower bench    Recommendations for Other Services      Precautions / Restrictions Precautions Precautions: Back Precaution Comments: reviewed back precautions Required Braces or Orthoses: Spinal Brace Spinal Brace: Thoracolumbosacral orthotic;Applied in sitting position Restrictions Other Position/Activity Restrictions: pt. reports she has not been able to lie on her back since surgery due to pain       Mobility Bed Mobility Overal bed mobility: Needs Assistance Bed Mobility: Rolling;Sidelying to Sit Rolling: Min assist Sidelying to sit: Min assist       General bed mobility comments: cues for technique.  Transfers Overall transfer level: Needs assistance Equipment used: Rolling walker (2 wheeled) Transfers: Sit to/from [facility] Sit to Stand: Min guard Stand pivot transfers: Min guard       General transfer comment: cues for technique.    Balance                                   ADL Overall ADL's : Needs assistance/impaired     Grooming: Wash/dry hands;Standing;Supervision/safety       Lower Body Bathing: With adaptive equipment;Moderate assistance Lower Body Bathing Details (indicate cue type and reason): pt. will have assistance from multiple family members     Lower Body Dressing: Min guard;Sit  to/from stand;With adaptive equipment Lower Body Dressing Details (indicate cue type and reason): pt. will also have assistance from multiple family members Toilet Transfer: Min Marine scientist Details (indicate cue type and reason): cues for hand placement  during sit/stand, stand/sit Toileting- Clothing Manipulation and Hygiene: Sit to/from stand;Min Child psychotherapist Details (indicate cue type and reason): pt. and sister (who will be one of her main caregivers) declined need for review or practice stating they have already reviewed it during previous session and sister is a Teacher, adult education as her main job so she is "skilled" in how to complete all of the necessary transfers ect. Functional mobility during ADLs: Min guard;Rolling walker General ADL Comments: sister able to don brace without assistance or cues.  active during session and properly guided pt. with walker and transfer safety during in-room ambulation.        Vision                     Perception     Praxis      Cognition   Behavior During Therapy: Anxious                         Extremity/Trunk Assessment               Exercises     Shoulder Instructions       General Comments      Pertinent Vitals/  Pain       Pain Assessment:  (did not rate but continued to reference she had not had her pain meds yet) Pain Intervention(s): Monitored during session;Repositioned  Home Living                                          Prior Functioning/Environment              Frequency Min 2X/week     Progress Toward Goals  OT Goals(current goals can now be found in the care plan section)  Progress towards OT goals: Goals met/education completed, patient discharged from [facility] Discharge plan remains appropriate    Co-evaluation                 End of Session Equipment Utilized During Treatment: Gait belt;Rolling walker;Back  brace   Activity Tolerance Patient tolerated treatment well   Patient Left in chair;with call bell/phone within reach;with family/visitor present   Nurse Communication          Time: 2217-9810 OT Time Calculation (min): 15 min  Charges: OT General Charges $OT Visit: 1 Procedure OT Treatments $Self Care/Home Management : 8-22 mins  Janice Coffin, COTA/L 09/26/2014, 8:05 AM

## 2014-09-26 NOTE — Discharge Summary (Addendum)
Patient ID: ALIJAH HYDE MRN: [id number] DOB/AGE: [birth year] 49 y.o.  Admit date: 09/22/2014 Discharge date: 09/26/2014  Admission Diagnoses:  Active Problems:   Burst fracture of lumbar vertebra   Discharge Diagnoses:  Active Problems:   Burst fracture of lumbar vertebra  status post Procedure(s): LATERAL L1 CORPECTOMY WITH T-11 - L3 FUSION AND POSTERIOR SPINAL FUSION INTERBODY T12 - L 2 2 LEVEL  Past Medical History  Diagnosis Date  . Asthma   . COPD (chronic obstructive pulmonary disease)   . Hypertension   . GERD (gastroesophageal reflux disease)   . Anxiety disorder   . Chronic pain syndrome   . Anxiety   . Depression   . Fibromyalgia   . Epigastric pain 03/29/2013  . Fatigue 03/29/2013  . Shortness of breath dyspnea     Surgeries: Procedure(s): LATERAL L1 CORPECTOMY WITH T-11 - L3 FUSION AND POSTERIOR SPINAL FUSION INTERBODY T12 - L 2 2 LEVEL on 09/22/2014   Consultants:    Discharged Condition: Improved  Hospital Course: ZAMIRAH DENNY is an 49 y.o. female who was admitted 09/22/2014 for operative treatment of <principal problem not specified>. Patient failed conservative treatments (please see the history and physical for the specifics) and had severe unremitting pain that affects sleep, daily activities and work/hobbies. After pre-op clearance, the patient was taken to the operating room on 09/22/2014 and underwent  Procedure(s): LATERAL L1 CORPECTOMY WITH T-11 - L3 FUSION AND POSTERIOR SPINAL FUSION INTERBODY T12 - L 2 2 LEVEL.    Patient was given perioperative antibiotics: Anti-infectives    Start     Dose/Rate Route Frequency Ordered Stop   09/22/14 2130  vancomycin (VANCOCIN) IVPB 1000 mg/200 mL premix     1,000 mg 200 mL/hr over 60 Minutes Intravenous  Once 09/22/14 2125 09/23/14 0403   09/21/14 1318  vancomycin (VANCOCIN) IVPB 1000 mg/200 mL premix     1,000 mg 200 mL/hr over 60 Minutes Intravenous 60 min pre-op 09/21/14 1318 09/22/14 1040         Patient was given sequential compression devices and early ambulation to prevent DVT.   Patient benefited maximally from hospital stay and there were no complications. At the time of discharge, the patient was urinating/moving their bowels without difficulty, tolerating a regular diet, pain is controlled with oral pain medications and they have been cleared by PT/OT.   Recent vital signs: Patient Vitals for the past 24 hrs:  BP Temp Temp src Pulse Resp SpO2  09/26/14 0432 (!) 148/69 mmHg 97.8 F (36.6 C) Oral 79 16 97 %  09/25/14 2048 111/67 mmHg 98.7 F (37.1 C) Oral 77 16 100 %  09/25/14 1400 128/70 mmHg 98.9 F (37.2 C) - 81 16 97 %  09/25/14 1056 (!) 147/78 mmHg - - - - -  09/25/14 0950 - - - - - 98 %  09/25/14 0939 - - - - - 98 %     Recent laboratory studies: No results for input(s): WBC, HGB, HCT, PLT, NA, K, CL, CO2, BUN, CREATININE, GLUCOSE, INR, CALCIUM in the last 72 hours.  Invalid input(s): PT, 2   Discharge Medications:     Medication List    STOP taking these medications        cyclobenzaprine 10 MG tablet  Commonly known as:  FLEXERIL     PRESCRIPTION MEDICATION      TAKE these medications        albuterol (2.5 MG/3ML) 0.083% nebulizer solution  Commonly known as:  PROVENTIL  Take 2.5 mg by nebulization every 6 (six) hours as needed for wheezing or shortness of breath.     albuterol 108 (90 BASE) MCG/ACT inhaler  Commonly known as:  PROVENTIL HFA;VENTOLIN HFA  Inhale 2 puffs into the lungs every 6 (six) hours as needed for wheezing or shortness of breath. For shortness of breath     busPIRone 5 MG tablet  Commonly known as:  BUSPAR  Take 5 mg by mouth 2 (two) times daily.     chlorzoxazone 500 MG tablet  Commonly known as:  PARAFON FORTE DSC  Take 1 tablet (500 mg total) by mouth 3 (three) times daily as needed for muscle spasms.     diazepam 10 MG tablet  Commonly known as:  VALIUM  Take 1 tablet (10 mg total) by mouth every 8 (eight)  hours as needed for anxiety (also for muscle spasms).     diltiazem 240 MG 24 hr capsule  Commonly known as:  CARDIZEM CD  Take 240 mg by mouth daily.     Fish Oil 1200 MG Caps  Take 1,200 mg by mouth daily.     lisinopril 20 MG tablet  Commonly known as:  PRINIVIL,ZESTRIL  Take 1 tablet (20 mg total) by mouth daily.     magnesium citrate Soln  Take 296 mLs (1 Bottle total) by mouth once.     mometasone-formoterol 200-5 MCG/ACT Aero  Commonly known as:  DULERA  Inhale 2 puffs into the lungs 2 (two) times daily.     neomycin-bacitracin-polymyxin Oint  Commonly known as:  NEOSPORIN  Apply 1 application topically 3 (three) times daily as needed (eczema).     omeprazole 20 MG capsule  Commonly known as:  PRILOSEC  Take 1 capsule (20 mg total) by mouth daily.     OxyCODONE 10 mg T12a 12 hr tablet  Commonly known as:  OXYCONTIN  Take 1 tablet (10 mg total) by mouth every 12 (twelve) hours.     oxyCODONE-acetaminophen 10-325 MG per tablet  Commonly known as:  PERCOCET  Take 1-2 tablets by mouth every 4 (four) hours as needed for pain.     pantoprazole 40 MG tablet  Commonly known as:  PROTONIX  TAKE 1 TABLET BY MOUTH TWICE DAILY BEFORE A MEAL.     phentermine 37.5 MG capsule  Take 37.5 mg by mouth daily.     polyethylene glycol 236 G solution  Commonly known as:  GOLYTELY  Take 4,000 mLs by mouth once.     sucralfate 1 GM/10ML suspension  Commonly known as:  CARAFATE  Take 1.5 g by mouth at bedtime as needed (acid reflux).        Diagnostic Studies: Dg Lumbar Spine Complete  09/22/2014   CLINICAL DATA:  L1 corpectomy with T11/L3 fusion.  EXAM: DG C-ARM GT 120 MIN; LUMBAR SPINE - COMPLETE 4+ VIEW  FLUOROSCOPY TIME:  Radiation Exposure Index (as provided by the fluoroscopic device): Not available  If the device does not provide the exposure index:  Fluoroscopy Time (in minutes and seconds):  6 min 45 seconds  Number of Acquired Images:  5  COMPARISON:  CT lumbar spine  08/14/2014  FINDINGS: Intraoperative fluoroscopy is utilized for surgical control purposes. Spot fluoroscopic imaging demonstrates placement of a spacer at the L1 vertebra with posterior rod and screw fixation from T11 through L3. Normal alignment is demonstrated. Compression fracture of L1 as noted on prior study. An enteric tube is visualized in the left upper quadrant.  IMPRESSION: Intraoperative fluoroscopy utilized for surgical ventral purposes, demonstrating posterior fixation from T11 through L3 with spacer at the anteriorly compressed L1.   Electronically Signed   By: Lucienne Capers M.D.   On: 09/22/2014 20:05   Dg Chest Port 1 View  09/23/2014   CLINICAL DATA:  Chest pain.  Lumbar surgery yesterday.  EXAM: PORTABLE CHEST - 1 VIEW  COMPARISON:  Chest radiographs obtained yesterday at 1804 hour  FINDINGS: Endotracheal and enteric tubes have been removed. Unchanged heart size and mediastinal contours. There is subcutaneous emphysema in the left lateral chest wall and left supraclavicular soft tissues, no definite pneumothorax. The subtle lucency along the left hemidiaphragm is unchanged. Pleural thickening on the right is unchanged. No new airspace disease.  IMPRESSION: Subcutaneous emphysema in the left chest wall and supraclavicular soft tissues, may be related to recent surgery. No definite pneumothorax. The subtle lucency overlying the left hemidiaphragm is unchanged.   Electronically Signed   By: Jeb Levering M.D.   On: 09/23/2014 05:00   Dg Chest Port 1 View  09/22/2014   CLINICAL DATA:  Intraoperative evaluation.  Lumbar surgery  EXAM: PORTABLE CHEST - 1 VIEW  COMPARISON:  Radiograph 09/22/2014  FINDINGS: Endotracheal tube and NG tube are unchanged. Stable cardiac silhouette. There is no clear pneumothorax identified. Subtle lucency along the left hemidiaphragm is again demonstrated. Mild pleural thickening on the right.  IMPRESSION: No significant pneumothorax identified.   Electronically  Signed   By: Suzy Bouchard M.D.   On: 09/22/2014 18:24   Dg Chest Port 1 View  09/22/2014   CLINICAL DATA:  Evaluate for left pneumothorax. Left rib removed in order to perform a left 1 corpectomy.  EXAM: PORTABLE CHEST - 1 VIEW  COMPARISON:  07/01/2014 acute abdomen series.  FINDINGS: Endotracheal tube terminates 2.3 cm above carina. Nasogastric tube extends beyond the inferior aspect of the film. Two radiopaque wires project over the upper right chest presumably external to the patient.  Normal heart size. No apical pneumothorax. Left hemidiaphragm is well visualized, and trace inferior left pleural air cannot be entirely excluded. Question mild right-sided pleural thickening or trace fluid. Adjacent volume loss in the lateral right lower lobe. Clear left lung.  IMPRESSION: No significant pneumothorax. Cannot exclude trace inferior left pleural air adjacent the left hemidiaphragm. Findings discussed with Dr. Rolena Infante at 3:43 p.m.  Suspect trace right pleural fluid or thickening with adjacent lower lobe volume loss.   Electronically Signed   By: Abigail Miyamoto M.D.   On: 09/22/2014 15:44   Dg C-arm Gt 120 Min  09/22/2014   CLINICAL DATA:  L1 corpectomy with T11/L3 fusion.  EXAM: DG C-ARM GT 120 MIN; LUMBAR SPINE - COMPLETE 4+ VIEW  FLUOROSCOPY TIME:  Radiation Exposure Index (as provided by the fluoroscopic device): Not available  If the device does not provide the exposure index:  Fluoroscopy Time (in minutes and seconds):  6 min 45 seconds  Number of Acquired Images:  5  COMPARISON:  CT lumbar spine 08/14/2014  FINDINGS: Intraoperative fluoroscopy is utilized for surgical control purposes. Spot fluoroscopic imaging demonstrates placement of a spacer at the L1 vertebra with posterior rod and screw fixation from T11 through L3. Normal alignment is demonstrated. Compression fracture of L1 as noted on prior study. An enteric tube is visualized in the left upper quadrant.  IMPRESSION: Intraoperative fluoroscopy  utilized for surgical ventral purposes, demonstrating posterior fixation from T11 through L3 with spacer at the anteriorly compressed L1.   Electronically Signed  By: Lucienne Capers M.D.   On: 09/22/2014 [facility]   Hospital course: POD#1 patient with significant pain.  Medications altered.  Noted to be neruologically intact.  New TLSO ordered for support.  CXR showed no PTX. POD#2/3 - pain medications adjusted again - better results.  Ambulating well with therapy.  Positive BM POD#4 - cleared by OT/PT   Will d/c to home with appropriate f/u        Follow-up Information    Follow up with Dahlia Bailiff, MD. Schedule an appointment as soon as possible for a visit in 10 days.   Specialty:  Orthopedic Surgery   Why:  For suture removal, For wound re-check   Contact information:   [address] [place] [address] [phone]       Discharge Plan:  discharge to home  Disposition: stable    Signed: Gola Bribiesca D for Dr. Melina Schools [facility] [phone] 09/26/2014, 8:19 AM      Pain medications:  Patient has percocet at home, will add valium for spasms and stop flexeril, also use Oxycontin for long acting pain control.

## 2014-09-28 ENCOUNTER — Encounter (HOSPITAL_COMMUNITY): Payer: Self-pay | Admitting: Orthopedic Surgery

## 2014-10-03 ENCOUNTER — Encounter (HOSPITAL_COMMUNITY): Payer: Self-pay | Admitting: Orthopedic Surgery

## 2014-12-30 ENCOUNTER — Other Ambulatory Visit (HOSPITAL_COMMUNITY): Payer: Self-pay | Admitting: Internal Medicine

## 2014-12-30 DIAGNOSIS — Z1231 Encounter for screening mammogram for malignant neoplasm of breast: Secondary | ICD-10-CM

## 2015-01-09 ENCOUNTER — Ambulatory Visit (HOSPITAL_COMMUNITY): Payer: Medicaid Other

## 2015-01-20 ENCOUNTER — Ambulatory Visit (INDEPENDENT_AMBULATORY_CARE_PROVIDER_SITE_OTHER): Payer: Medicaid Other | Admitting: Obstetrics and Gynecology

## 2015-01-20 ENCOUNTER — Encounter: Payer: Self-pay | Admitting: Obstetrics and Gynecology

## 2015-01-20 VITALS — BP 142/90 | Ht 64.0 in | Wt 154.5 lb

## 2015-01-20 DIAGNOSIS — Z01419 Encounter for gynecological examination (general) (routine) without abnormal findings: Secondary | ICD-10-CM | POA: Diagnosis not present

## 2015-01-20 DIAGNOSIS — Z0000 Encounter for general adult medical examination without abnormal findings: Secondary | ICD-10-CM | POA: Diagnosis not present

## 2015-01-20 NOTE — Progress Notes (Signed)
Patient ID: [name], female   DOB: 1965/07/23, 49 y.o.   MRN: [id number]   Assessment:  Annual Gyn Exam   Plan:  1. pap smear done, next pap due in 06/2016   2. return annually or prn 3    Annual mammogram advised Subjective:  [name] is a 49 y.o. female G2P2 who presents for annual exam. No LMP recorded. Patient has had an ablation. The patient has no complaints today.   The following portions of the patient's history were reviewed and updated as appropriate: allergies, current medications, past family history, past medical history, past social history, past surgical history and problem list. Past Medical History  Diagnosis Date  . Asthma   . COPD (chronic obstructive pulmonary disease)   . Hypertension   . GERD (gastroesophageal reflux disease)   . Anxiety disorder   . Chronic pain syndrome   . Anxiety   . Depression   . Fibromyalgia   . Epigastric pain 03/29/2013  . Fatigue 03/29/2013  . Shortness of breath dyspnea     Past Surgical History  Procedure Laterality Date  . Cholecystectomy    . Cesarean section    . Carpal tunnel release    . Esophagogastroduodenoscopy  07/31/2011    Procedure: ESOPHAGOGASTRODUODENOSCOPY (EGD);  Surgeon: Rogene Houston, MD;  [facility];  Service: Endoscopy;  Laterality: N/A;  Venia Minks dilation  07/31/2011    Procedure: Venia Minks DILATION;  Surgeon: Rogene Houston, MD;  [facility];  Service: Endoscopy;;  . Endometrial ablation    . Mass excision  05/20/2012    Procedure: EXCISION MASS;  Surgeon: Sanjuana Kava, MD;  Location: [facility];  Service: Orthopedics;  Laterality: Left;  Repair of Fascial Defect Left Knee  . Carpal tunnel release  07/23/2012    Procedure: CARPAL TUNNEL RELEASE;  Surgeon: Sanjuana Kava, MD;  Location: [facility];  Service: Orthopedics;  Laterality: Left;  . Esophagogastroduodenoscopy (egd) with esophageal dilation N/A 09/30/2012    Procedure: ESOPHAGOGASTRODUODENOSCOPY (EGD) WITH  ESOPHAGEAL DILATION;  Surgeon: Rogene Houston, MD;  [facility];  Service: Endoscopy;  Laterality: N/A;  245  . Knee surgery Left   . Esophagogastroduodenoscopy N/A 12/31/2013    Procedure: ESOPHAGOGASTRODUODENOSCOPY (EGD);  Surgeon: Rogene Houston, MD;  [facility];  Service: Endoscopy;  Laterality: N/A;  120-moved to 925 Ann to notify pt  . Balloon dilation N/A 12/31/2013    Procedure: BALLOON DILATION;  Surgeon: Rogene Houston, MD;  [facility];  Service: Endoscopy;  Laterality: N/A;  Venia Minks dilation N/A 12/31/2013    Procedure: Venia Minks DILATION;  Surgeon: Rogene Houston, MD;  [facility];  Service: Endoscopy;  Laterality: N/A;  . Savory dilation N/A 12/31/2013    Procedure: SAVORY DILATION;  Surgeon: Rogene Houston, MD;  [facility];  Service: Endoscopy;  Laterality: N/A;  . Cyst removed Left     had surgery to removed cyst  . Spine surgery       Current outpatient prescriptions:  .  albuterol (PROVENTIL HFA;VENTOLIN HFA) 108 (90 BASE) MCG/ACT inhaler, Inhale 2 puffs into the lungs every 6 (six) hours as needed for wheezing or shortness of breath. For shortness of breath (Patient taking differently: Inhale 2 puffs into the lungs every 6 (six) hours as needed for wheezing or shortness of breath. ), Disp: 1 Inhaler, Rfl: 1 .  albuterol (PROVENTIL) (2.5 MG/3ML) 0.083% nebulizer solution, Take 2.5 mg by  nebulization every 6 (six) hours as needed for wheezing or shortness of breath. , Disp: , Rfl:  .  citalopram (CELEXA) 20 MG tablet, Take 20 mg by mouth daily., Disp: , Rfl:  .  diazepam (VALIUM) 10 MG tablet, Take 1 tablet (10 mg total) by mouth every 8 (eight) hours as needed for anxiety (also for muscle spasms)., Disp: 45 tablet, Rfl: 0 .  diltiazem (CARDIZEM CD) 240 MG 24 hr capsule, Take 240 mg by mouth daily., Disp: , Rfl:  .  lisinopril (PRINIVIL,ZESTRIL) 20 MG tablet, Take 1 tablet (20 mg total) by mouth daily., Disp: 30  tablet, Rfl: 0 .  Omega-3 Fatty Acids (FISH OIL) 1200 MG CAPS, Take 1,200 mg by mouth daily. , Disp: , Rfl:  .  omeprazole (PRILOSEC) 20 MG capsule, Take 1 capsule (20 mg total) by mouth daily., Disp: 30 capsule, Rfl: 0 .  oxyCODONE-acetaminophen (PERCOCET) 10-325 MG per tablet, Take 1-2 tablets by mouth every 4 (four) hours as needed for pain. , Disp: , Rfl:  .  pantoprazole (PROTONIX) 40 MG tablet, TAKE 1 TABLET BY MOUTH TWICE DAILY BEFORE A MEAL., Disp: 60 tablet, Rfl: 5 .  phentermine 37.5 MG capsule, Take 37.5 mg by mouth daily., Disp: , Rfl:  .  magnesium citrate SOLN, Take 296 mLs (1 Bottle total) by mouth once., Disp: 195 mL, Rfl: 2 .  mometasone-formoterol (DULERA) 200-5 MCG/ACT AERO, Inhale 2 puffs into the lungs 2 (two) times daily., Disp: , Rfl:   Review of Systems Constitutional: negative Gastrointestinal: negative Genitourinary: negative  Objective:  BP 142/90 mmHg  Ht 5\' 4"  (1.626 m)  Wt 154 lb 8 oz (70.081 kg)  BMI 26.51 kg/m2   BMI: Body mass index is 26.51 kg/(m^2).  General Appearance: Alert, appropriate appearance for age. No acute distress HEENT: Grossly normal Neck / Thyroid:  Cardiovascular: RRR; normal S1, S2, no murmur Lungs: CTA bilaterally Back: No CVAT Breast Exam: Not indicated Gastrointestinal: Soft, non-tender, no masses or organomegaly Pelvic Exam: Vulva and vagina appear normal. Bimanual exam reveals normal uterus and adnexa. External genitalia: normal general appearance Vaginal: normal mucosa without prolapse or lesions and normal without tenderness, induration or masses Cervix: normal appearance Adnexa: normal bimanual exam Uterus: normal single, nontender Rectovaginal: not indicated Lymphatic Exam: Non-palpable nodes in neck, clavicular, axillary, or inguinal regions  Skin: no rash or abnormalities Neurologic: Normal gait and speech, no tremor  Psychiatric: Alert and oriented, appropriate affect.  Urinalysis:Not done  Mallory Shirk.  MD Pgr [phone] 12:50 PM   This chart was scribed for Jonnie Kind, MD by Tula Nakayama, ED Scribe. This patient was seen in room 3 and the patient's care was started at 12:50 PM.   I personally performed the services described in this documentation, which was SCRIBED in my presence. The recorded information has been reviewed and considered accurate. It has been edited as necessary during review. Jonnie Kind, MD

## 2015-01-20 NOTE — Progress Notes (Signed)
Patient ID: [name], female   DOB: 08-Nov-1965, 49 y.o.   MRN: [id number] Pt here today for annual exam. Pt denies any problems or concerns at this time.

## 2015-02-09 ENCOUNTER — Encounter (HOSPITAL_COMMUNITY): Payer: Self-pay | Admitting: *Deleted

## 2015-02-09 ENCOUNTER — Emergency Department (HOSPITAL_COMMUNITY)
Admission: EM | Admit: 2015-02-09 | Discharge: 2015-02-09 | Disposition: A | Discharge status: Home / Self Care | Payer: Medicaid Other | Source: Home / Self Care | Attending: Emergency Medicine | Admitting: Emergency Medicine

## 2015-02-09 ENCOUNTER — Emergency Department (HOSPITAL_COMMUNITY): Payer: Medicaid Other

## 2015-02-09 ENCOUNTER — Inpatient Hospital Stay (HOSPITAL_COMMUNITY)
Admission: EM | Admit: 2015-02-09 | Discharge: 2015-02-10 | DRG: 392 | Disposition: A | Discharge status: Home / Self Care | Payer: Medicaid Other | Attending: Internal Medicine | Admitting: Internal Medicine

## 2015-02-09 ENCOUNTER — Encounter (HOSPITAL_COMMUNITY): Payer: Self-pay | Admitting: Emergency Medicine

## 2015-02-09 DIAGNOSIS — F419 Anxiety disorder, unspecified: Secondary | ICD-10-CM

## 2015-02-09 DIAGNOSIS — J441 Chronic obstructive pulmonary disease with (acute) exacerbation: Secondary | ICD-10-CM

## 2015-02-09 DIAGNOSIS — K219 Gastro-esophageal reflux disease without esophagitis: Secondary | ICD-10-CM

## 2015-02-09 DIAGNOSIS — Z87891 Personal history of nicotine dependence: Secondary | ICD-10-CM | POA: Insufficient documentation

## 2015-02-09 DIAGNOSIS — J449 Chronic obstructive pulmonary disease, unspecified: Secondary | ICD-10-CM | POA: Diagnosis present

## 2015-02-09 DIAGNOSIS — R200 Anesthesia of skin: Secondary | ICD-10-CM

## 2015-02-09 DIAGNOSIS — R1013 Epigastric pain: Secondary | ICD-10-CM | POA: Diagnosis present

## 2015-02-09 DIAGNOSIS — F329 Major depressive disorder, single episode, unspecified: Secondary | ICD-10-CM | POA: Insufficient documentation

## 2015-02-09 DIAGNOSIS — R112 Nausea with vomiting, unspecified: Principal | ICD-10-CM | POA: Diagnosis present

## 2015-02-09 DIAGNOSIS — G43A1 Cyclical vomiting, intractable: Secondary | ICD-10-CM

## 2015-02-09 DIAGNOSIS — M797 Fibromyalgia: Secondary | ICD-10-CM | POA: Diagnosis present

## 2015-02-09 DIAGNOSIS — G894 Chronic pain syndrome: Secondary | ICD-10-CM | POA: Diagnosis present

## 2015-02-09 DIAGNOSIS — J45909 Unspecified asthma, uncomplicated: Secondary | ICD-10-CM | POA: Diagnosis present

## 2015-02-09 DIAGNOSIS — R1110 Vomiting, unspecified: Secondary | ICD-10-CM | POA: Diagnosis present

## 2015-02-09 DIAGNOSIS — I10 Essential (primary) hypertension: Secondary | ICD-10-CM

## 2015-02-09 DIAGNOSIS — Z825 Family history of asthma and other chronic lower respiratory diseases: Secondary | ICD-10-CM | POA: Diagnosis not present

## 2015-02-09 DIAGNOSIS — Z7951 Long term (current) use of inhaled steroids: Secondary | ICD-10-CM | POA: Insufficient documentation

## 2015-02-09 DIAGNOSIS — R079 Chest pain, unspecified: Secondary | ICD-10-CM

## 2015-02-09 DIAGNOSIS — Z79899 Other long term (current) drug therapy: Secondary | ICD-10-CM

## 2015-02-09 LAB — CBC WITH DIFFERENTIAL/PLATELET
Basophils Absolute: 0 10*3/uL (ref 0.0–0.1)
Basophils Relative: 1 % (ref 0–1)
EOS PCT: 1 % (ref 0–5)
Eosinophils Absolute: 0.1 10*3/uL (ref 0.0–0.7)
HCT: 42.3 % (ref 36.0–46.0)
Hemoglobin: 14.2 g/dL (ref 12.0–15.0)
LYMPHS ABS: 1 10*3/uL (ref 0.7–4.0)
Lymphocytes Relative: 20 % (ref 12–46)
MCH: 31.1 pg (ref 26.0–34.0)
MCHC: 33.6 g/dL (ref 30.0–36.0)
MCV: 92.6 fL (ref 78.0–100.0)
Monocytes Absolute: 0.3 10*3/uL (ref 0.1–1.0)
Monocytes Relative: 7 % (ref 3–12)
NEUTROS ABS: 3.5 10*3/uL (ref 1.7–7.7)
NEUTROS PCT: 72 % (ref 43–77)
Platelets: 257 10*3/uL (ref 150–400)
RBC: 4.57 MIL/uL (ref 3.87–5.11)
RDW: 15 % (ref 11.5–15.5)
WBC: 4.9 10*3/uL (ref 4.0–10.5)

## 2015-02-09 LAB — COMPREHENSIVE METABOLIC PANEL
ALT: 18 U/L (ref 14–54)
ANION GAP: 15 (ref 5–15)
AST: 28 U/L (ref 15–41)
Albumin: 4.6 g/dL (ref 3.5–5.0)
Alkaline Phosphatase: 103 U/L (ref 38–126)
BUN: 13 mg/dL (ref 6–20)
CHLORIDE: 104 mmol/L (ref 101–111)
CO2: 20 mmol/L — ABNORMAL LOW (ref 22–32)
Calcium: 9.6 mg/dL (ref 8.9–10.3)
Creatinine, Ser: 0.75 mg/dL (ref 0.44–1.00)
GFR calc Af Amer: >60 mL/min (ref >60)
Glucose, Bld: 141 mg/dL — ABNORMAL HIGH (ref 65–99)
Potassium: 3.3 mmol/L — ABNORMAL LOW (ref 3.5–5.1)
Sodium: 139 mmol/L (ref 135–145)
TOTAL PROTEIN: 8 g/dL (ref 6.5–8.1)
Total Bilirubin: 1.2 mg/dL (ref 0.3–1.2)

## 2015-02-09 LAB — TROPONIN I
Troponin I: <0.03 ng/mL (ref <0.031)
Troponin I: 0.03 ng/mL (ref <0.031)

## 2015-02-09 LAB — LIPASE, BLOOD: Lipase: 18 U/L — ABNORMAL LOW (ref 22–51)

## 2015-02-09 MED ORDER — ONDANSETRON HCL 4 MG/2ML IJ SOLN
4.0000 mg | Freq: Once | INTRAMUSCULAR | Status: AC
  Administered 2015-02-09: 4 mg via INTRAVENOUS
  Filled 2015-02-09: qty 2

## 2015-02-09 MED ORDER — CITALOPRAM HYDROBROMIDE 20 MG PO TABS
20.0000 mg | ORAL_TABLET | Freq: Every day | ORAL | Status: DC
  Filled 2015-02-09: qty 1

## 2015-02-09 MED ORDER — SODIUM CHLORIDE 0.9 % IV BOLUS (SEPSIS)
500.0000 mL | Freq: Once | INTRAVENOUS | Status: AC
  Administered 2015-02-09: 500 mL via INTRAVENOUS

## 2015-02-09 MED ORDER — MOMETASONE FURO-FORMOTEROL FUM 200-5 MCG/ACT IN AERO
2.0000 | INHALATION_SPRAY | Freq: Two times a day (BID) | RESPIRATORY_TRACT | Status: DC
  Administered 2015-02-10: 2 via RESPIRATORY_TRACT
  Filled 2015-02-09: qty 8.8

## 2015-02-09 MED ORDER — ACETAMINOPHEN 325 MG PO TABS: 650.0000 mg | ORAL_TABLET | Freq: Four times a day (QID) | ORAL | Status: DC | PRN

## 2015-02-09 MED ORDER — LORAZEPAM 2 MG/ML IJ SOLN
INTRAMUSCULAR | Status: AC
  Filled 2015-02-09: qty 1

## 2015-02-09 MED ORDER — HYDRALAZINE HCL 25 MG PO TABS
25.0000 mg | ORAL_TABLET | Freq: Four times a day (QID) | ORAL | Status: DC | PRN
Start: 2015-02-09 — End: 2015-02-10
  Filled 2015-02-09: qty 1

## 2015-02-09 MED ORDER — ALBUTEROL SULFATE (2.5 MG/3ML) 0.083% IN NEBU
2.5000 mg | INHALATION_SOLUTION | Freq: Four times a day (QID) | RESPIRATORY_TRACT | Status: DC | PRN
  Administered 2015-02-10: 2.5 mg via RESPIRATORY_TRACT
  Filled 2015-02-09: qty 3

## 2015-02-09 MED ORDER — ONDANSETRON HCL 4 MG PO TABS: 4.0000 mg | ORAL_TABLET | Freq: Four times a day (QID) | ORAL | Status: DC | PRN

## 2015-02-09 MED ORDER — PANTOPRAZOLE SODIUM 40 MG PO TBEC
DELAYED_RELEASE_TABLET | ORAL | Status: AC
  Administered 2015-02-09: 40 mg via ORAL
  Filled 2015-02-09: qty 1

## 2015-02-09 MED ORDER — KETOROLAC TROMETHAMINE 30 MG/ML IJ SOLN
30.0000 mg | Freq: Once | INTRAMUSCULAR | Status: AC
  Administered 2015-02-09: 30 mg via INTRAVENOUS
  Filled 2015-02-09: qty 1

## 2015-02-09 MED ORDER — OXYCODONE-ACETAMINOPHEN 5-325 MG PO TABS
ORAL_TABLET | ORAL | Status: AC
  Administered 2015-02-09: 1 via ORAL
  Filled 2015-02-09: qty 1

## 2015-02-09 MED ORDER — ASPIRIN 81 MG PO CHEW
CHEWABLE_TABLET | ORAL | Status: AC
  Filled 2015-02-09: qty 1

## 2015-02-09 MED ORDER — GI COCKTAIL ~~LOC~~
30.0000 mL | Freq: Once | ORAL | Status: AC
  Administered 2015-02-09: 30 mL via ORAL
  Filled 2015-02-09: qty 30

## 2015-02-09 MED ORDER — LISINOPRIL 10 MG PO TABS
20.0000 mg | ORAL_TABLET | Freq: Every day | ORAL | Status: DC
  Administered 2015-02-10: 20 mg via ORAL
  Filled 2015-02-09: qty 2

## 2015-02-09 MED ORDER — ASPIRIN 81 MG PO CHEW
324.0000 mg | CHEWABLE_TABLET | Freq: Once | ORAL | Status: AC
  Administered 2015-02-09: 324 mg via ORAL
  Filled 2015-02-09: qty 4

## 2015-02-09 MED ORDER — PROMETHAZINE HCL 25 MG/ML IJ SOLN
INTRAMUSCULAR | Status: AC
  Filled 2015-02-09: qty 1

## 2015-02-09 MED ORDER — GI COCKTAIL ~~LOC~~: 10.0000 mL | Freq: Once | ORAL | Status: DC

## 2015-02-09 MED ORDER — ACETAMINOPHEN 650 MG RE SUPP: 650.0000 mg | Freq: Four times a day (QID) | RECTAL | Status: DC | PRN

## 2015-02-09 MED ORDER — PANTOPRAZOLE SODIUM 40 MG IV SOLR
40.0000 mg | INTRAVENOUS | Status: DC
  Administered 2015-02-10: 40 mg via INTRAVENOUS
  Filled 2015-02-09: qty 40

## 2015-02-09 MED ORDER — PANTOPRAZOLE SODIUM 40 MG PO TBEC
40.0000 mg | DELAYED_RELEASE_TABLET | Freq: Once | ORAL | Status: AC
  Administered 2015-02-09: 40 mg via ORAL

## 2015-02-09 MED ORDER — HYDROMORPHONE HCL 1 MG/ML IJ SOLN
1.0000 mg | Freq: Once | INTRAMUSCULAR | Status: AC
  Administered 2015-02-09: 1 mg via INTRAVENOUS
  Filled 2015-02-09: qty 1

## 2015-02-09 MED ORDER — MORPHINE SULFATE 4 MG/ML IJ SOLN
4.0000 mg | Freq: Once | INTRAMUSCULAR | Status: AC
  Administered 2015-02-09: 4 mg via INTRAVENOUS
  Filled 2015-02-09: qty 1

## 2015-02-09 MED ORDER — LORAZEPAM 2 MG/ML IJ SOLN: 1.0000 mg | Freq: Four times a day (QID) | INTRAMUSCULAR | Status: DC | PRN

## 2015-02-09 MED ORDER — DILTIAZEM HCL 30 MG PO TABS
120.0000 mg | ORAL_TABLET | Freq: Once | ORAL | Status: AC
  Administered 2015-02-09: 120 mg via ORAL
  Filled 2015-02-09: qty 4

## 2015-02-09 MED ORDER — PROMETHAZINE HCL 25 MG/ML IJ SOLN
12.5000 mg | Freq: Once | INTRAMUSCULAR | Status: AC
  Administered 2015-02-09: 12.5 mg via INTRAVENOUS
  Filled 2015-02-09: qty 1

## 2015-02-09 MED ORDER — DILTIAZEM HCL ER COATED BEADS 240 MG PO CP24
240.0000 mg | ORAL_CAPSULE | Freq: Every day | ORAL | Status: DC
  Administered 2015-02-10: 240 mg via ORAL
  Filled 2015-02-09: qty 1

## 2015-02-09 MED ORDER — PROMETHAZINE HCL 25 MG PO TABS: 25.0000 mg | ORAL_TABLET | Freq: Four times a day (QID) | ORAL | Status: DC | PRN

## 2015-02-09 MED ORDER — IOHEXOL 350 MG/ML SOLN
100.0000 mL | Freq: Once | INTRAVENOUS | Status: AC | PRN
  Administered 2015-02-09: 100 mL via INTRAVENOUS

## 2015-02-09 MED ORDER — HYDROMORPHONE HCL 1 MG/ML IJ SOLN
1.0000 mg | INTRAMUSCULAR | Status: DC | PRN
  Administered 2015-02-09 – 2015-02-10 (×3): 1 mg via INTRAVENOUS
  Filled 2015-02-09 (×3): qty 1

## 2015-02-09 MED ORDER — LISINOPRIL 10 MG PO TABS
20.0000 mg | ORAL_TABLET | Freq: Once | ORAL | Status: AC
  Administered 2015-02-09: 20 mg via ORAL
  Filled 2015-02-09: qty 2

## 2015-02-09 MED ORDER — PROMETHAZINE HCL 25 MG/ML IJ SOLN
12.5000 mg | Freq: Once | INTRAMUSCULAR | Status: AC
  Administered 2015-02-09: 12.5 mg via INTRAVENOUS

## 2015-02-09 MED ORDER — LORAZEPAM 2 MG/ML IJ SOLN
1.0000 mg | Freq: Once | INTRAMUSCULAR | Status: AC
  Administered 2015-02-09: 1 mg via INTRAVENOUS
  Filled 2015-02-09: qty 1

## 2015-02-09 MED ORDER — ENOXAPARIN SODIUM 40 MG/0.4ML ~~LOC~~ SOLN
40.0000 mg | SUBCUTANEOUS | Status: DC
  Administered 2015-02-10: 40 mg via SUBCUTANEOUS
  Filled 2015-02-09: qty 0.4

## 2015-02-09 MED ORDER — LORAZEPAM 2 MG/ML IJ SOLN
1.0000 mg | Freq: Once | INTRAMUSCULAR | Status: AC
  Administered 2015-02-09: 1 mg via INTRAVENOUS

## 2015-02-09 MED ORDER — ONDANSETRON HCL 4 MG/2ML IJ SOLN: 4.0000 mg | Freq: Four times a day (QID) | INTRAMUSCULAR | Status: DC | PRN

## 2015-02-09 MED ORDER — LORAZEPAM 2 MG/ML IJ SOLN
0.5000 mg | Freq: Once | INTRAMUSCULAR | Status: AC
  Administered 2015-02-09: 0.5 mg via INTRAVENOUS
  Filled 2015-02-09: qty 1

## 2015-02-09 MED ORDER — POTASSIUM CHLORIDE IN NACL 20-0.9 MEQ/L-% IV SOLN
INTRAVENOUS | Status: DC
  Administered 2015-02-09 – 2015-02-10 (×2): via INTRAVENOUS

## 2015-02-09 MED ORDER — PROMETHAZINE HCL 25 MG/ML IJ SOLN
25.0000 mg | Freq: Once | INTRAMUSCULAR | Status: AC
  Administered 2015-02-09: 25 mg via INTRAMUSCULAR
  Filled 2015-02-09: qty 1

## 2015-02-09 MED ORDER — FAMOTIDINE 20 MG PO TABS: 20.0000 mg | ORAL_TABLET | Freq: Two times a day (BID) | ORAL | Status: DC

## 2015-02-09 MED ORDER — OXYCODONE-ACETAMINOPHEN 5-325 MG PO TABS
1.0000 | ORAL_TABLET | Freq: Once | ORAL | Status: AC
  Administered 2015-02-09: 1 via ORAL

## 2015-02-09 NOTE — H&P (Signed)
PCP:   [facility]   Chief Complaint:  Nausea and vomiting  HPI: 49 year old female who   has a past medical history of Asthma; COPD (chronic obstructive pulmonary disease); Hypertension; GERD (gastroesophageal reflux disease); Anxiety disorder; Chronic pain syndrome; Anxiety; Depression; Fibromyalgia; Epigastric pain (03/29/2013); Fatigue (03/29/2013); and Shortness of breath dyspnea. Today came to the hospital with complaints of abdominal pain with nausea vomiting. Patient says that the symptoms started this morning she had some chest discomfort epigastric pain and nausea vomiting. Patient says that she had 4-5 episodes of vomiting this morning patient was seen in the ED earlier today and was discharged home and then she went to get hamburger after she ate about half of it she again developed recurrent epigastric pain as well as vomiting. Patient has had cholecystectomy in the past. She denies any fever. No dysuria. Patient says that she used to get these symptoms when she gets upset. She has been taking Celexa and Valium for anxiety and depression. In the ED chest x-ray showed no pneumonia, CT angiogram of the chest was done which was negative for pulmonary embolism. Lipase 18 Allergies:  No Known Allergies    Past Medical History  Diagnosis Date  . Asthma   . COPD (chronic obstructive pulmonary disease)   . Hypertension   . GERD (gastroesophageal reflux disease)   . Anxiety disorder   . Chronic pain syndrome   . Anxiety   . Depression   . Fibromyalgia   . Epigastric pain 03/29/2013  . Fatigue 03/29/2013  . Shortness of breath dyspnea     Past Surgical History  Procedure Laterality Date  . Cholecystectomy    . Cesarean section    . Carpal tunnel release    . Esophagogastroduodenoscopy  07/31/2011    Procedure: ESOPHAGOGASTRODUODENOSCOPY (EGD);  Surgeon: Rogene Houston, MD;  [facility];  Service: Endoscopy;  Laterality: N/A;  Venia Minks dilation   07/31/2011    Procedure: Venia Minks DILATION;  Surgeon: Rogene Houston, MD;  [facility];  Service: Endoscopy;;  . Endometrial ablation    . Mass excision  05/20/2012    Procedure: EXCISION MASS;  Surgeon: Sanjuana Kava, MD;  Location: [facility];  Service: Orthopedics;  Laterality: Left;  Repair of Fascial Defect Left Knee  . Carpal tunnel release  07/23/2012    Procedure: CARPAL TUNNEL RELEASE;  Surgeon: Sanjuana Kava, MD;  Location: [facility];  Service: Orthopedics;  Laterality: Left;  . Esophagogastroduodenoscopy (egd) with esophageal dilation N/A 09/30/2012    Procedure: ESOPHAGOGASTRODUODENOSCOPY (EGD) WITH ESOPHAGEAL DILATION;  Surgeon: Rogene Houston, MD;  [facility];  Service: Endoscopy;  Laterality: N/A;  245  . Knee surgery Left   . Esophagogastroduodenoscopy N/A 12/31/2013    Procedure: ESOPHAGOGASTRODUODENOSCOPY (EGD);  Surgeon: Rogene Houston, MD;  [facility];  Service: Endoscopy;  Laterality: N/A;  120-moved to 925 Ann to notify pt  . Balloon dilation N/A 12/31/2013    Procedure: BALLOON DILATION;  Surgeon: Rogene Houston, MD;  [facility];  Service: Endoscopy;  Laterality: N/A;  Venia Minks dilation N/A 12/31/2013    Procedure: Venia Minks DILATION;  Surgeon: Rogene Houston, MD;  [facility];  Service: Endoscopy;  Laterality: N/A;  . Savory dilation N/A 12/31/2013    Procedure: SAVORY DILATION;  Surgeon: Rogene Houston, MD;  [facility];  Service: Endoscopy;  Laterality: N/A;  . Cyst removed Left     had  surgery to removed cyst  . Spine surgery      Prior to Admission medications   Medication Sig Start Date End Date Taking? Authorizing Provider  albuterol (PROVENTIL HFA;VENTOLIN HFA) 108 (90 BASE) MCG/ACT inhaler Inhale 2 puffs into the lungs every 6 (six) hours as needed for wheezing or shortness of breath. For shortness of breath Patient taking differently: Inhale 2 puffs into the lungs every 6 (six) hours as needed  for wheezing or shortness of breath.  07/31/11  Yes Kathie Dike, MD  albuterol (PROVENTIL) (2.5 MG/3ML) 0.083% nebulizer solution Take 2.5 mg by nebulization every 6 (six) hours as needed for wheezing or shortness of breath.    Yes Historical Provider, MD  citalopram (CELEXA) 20 MG tablet Take 20 mg by mouth daily.   Yes Historical Provider, MD  diazepam (VALIUM) 10 MG tablet Take 1 tablet (10 mg total) by mouth every 8 (eight) hours as needed for anxiety (also for muscle spasms). 09/26/14  Yes Melina Schools, MD  diltiazem (CARDIZEM CD) 240 MG 24 hr capsule Take 240 mg by mouth daily.   Yes Historical Provider, MD  famotidine (PEPCID) 20 MG tablet Take 1 tablet (20 mg total) by mouth 2 (two) times daily. 02/09/15  Yes Almyra Free Idol, PA-C  lisinopril (PRINIVIL,ZESTRIL) 20 MG tablet Take 1 tablet (20 mg total) by mouth daily. 07/31/11  Yes Kathie Dike, MD  mometasone-formoterol (DULERA) 200-5 MCG/ACT AERO Inhale 2 puffs into the lungs 2 (two) times daily.   Yes Historical Provider, MD  Omega-3 Fatty Acids (FISH OIL) 1200 MG CAPS Take 1,200 mg by mouth daily.    Yes Historical Provider, MD  omeprazole (PRILOSEC) 20 MG capsule Take 1 capsule (20 mg total) by mouth daily. 06/25/14  Yes Ezequiel Essex, MD  oxyCODONE-acetaminophen (PERCOCET) 10-325 MG per tablet Take 1-2 tablets by mouth every 4 (four) hours as needed for pain.    Yes Historical Provider, MD  pantoprazole (PROTONIX) 40 MG tablet TAKE 1 TABLET BY MOUTH TWICE DAILY BEFORE A MEAL. 08/03/14  Yes Rogene Houston, MD  phentermine 37.5 MG capsule Take 37.5 mg by mouth daily.   Yes Historical Provider, MD  promethazine (PHENERGAN) 25 MG tablet Take 1 tablet (25 mg total) by mouth every 6 (six) hours as needed for nausea or vomiting. 02/09/15   Evalee Jefferson, PA-C    Social History:  reports that she quit smoking about 3 years ago. Her smoking use included Cigarettes. She has never used smokeless tobacco. She reports that she drinks about 1.2 oz of  alcohol per week. She reports that she does not use illicit drugs.  Family History  Problem Relation Age of Onset  . Asthma Mother   . Asthma Father   . Cancer Father     Danley Danker Weights   02/09/15 1920  Weight: 70.308 kg (155 lb)    All the positives are listed in BOLD  Review of Systems:  HEENT: Headache, blurred vision, runny nose, sore throat Neck: Hypothyroidism, hyperthyroidism,,lymphadenopathy Chest : Shortness of breath, history of COPD, Asthma Heart : Chest pain, history of coronary arterey disease GI:  Nausea, vomiting, diarrhea, constipation, GERD GU: Dysuria, urgency, frequency of urination, hematuria Neuro: Stroke, seizures, syncope Psych: Depression, anxiety, hallucinations   Physical Exam: Blood pressure 146/99, pulse 73, temperature 97.5 F (36.4 C), temperature source Oral, resp. rate 18, height 5\' 4"  (1.626 m), weight 70.308 kg (155 lb), last menstrual period 07/16/2007, SpO2 100 %. Constitutional:   Patient is a well-developed and well-nourished female in  no acute distress and cooperative with exam. Head: Normocephalic and atraumatic Mouth: Mucus membranes moist Eyes: PERRL, EOMI, conjunctivae normal Neck: Supple, No Thyromegaly Cardiovascular: RRR, S1 normal, S2 normal Pulmonary/Chest: CTAB, no wheezes, rales, or rhonchi Abdominal: Soft. Tender to palpation, non-distended, bowel sounds are normal, no masses, organomegaly, or guarding present.  Neurological: A&O x3, Strength is normal and symmetric bilaterally, cranial nerve II-XII are grossly intact, no focal motor deficit, sensory intact to light touch bilaterally.  Extremities : No Cyanosis, Clubbing or Edema  Labs on Admission:  Basic Metabolic Panel:  Recent Labs Lab 02/09/15 1120  NA 139  K 3.3*  CL 104  CO2 20*  GLUCOSE 141*  BUN 13  CREATININE 0.75  CALCIUM 9.6   Liver Function Tests:  Recent Labs Lab 02/09/15 1120  AST 28  ALT 18  ALKPHOS 103  BILITOT 1.2  PROT 8.0  ALBUMIN  4.6    Recent Labs Lab 02/09/15 1120  LIPASE 18*   No results for input(s): AMMONIA in the last 168 hours. CBC:  Recent Labs Lab 02/09/15 1120  WBC 4.9  NEUTROABS 3.5  HGB 14.2  HCT 42.3  MCV 92.6  PLT 257   Cardiac Enzymes:  Recent Labs Lab 02/09/15 1120 02/09/15 1408  TROPONINI <0.03 0.03    BNP (last 3 results) No results for input(s): BNP in the last 8760 hours.  ProBNP (last 3 results) No results for input(s): PROBNP in the last 8760 hours.  CBG: No results for input(s): GLUCAP in the last 168 hours.  Radiological Exams on Admission: Dg Chest 2 View  02/09/2015   CLINICAL DATA:  Epigastric pain and vomiting  EXAM: CHEST - 2 VIEW  COMPARISON:  09/23/2014  FINDINGS: Cardiac shadow is within normal limits. The lungs are well aerated bilaterally. Bilateral nipple shadows are noted. No focal infiltrate is seen. Postsurgical changes in the thoracolumbar spine are noted.  IMPRESSION: No acute abnormality noted.  Bilateral nipple shadows seen.   Electronically Signed   By: Inez Catalina M.D.   On: 02/09/2015 11:53   Ct Angio Chest Pe W/cm &/or Wo Cm  02/09/2015   CLINICAL DATA:  Shortness of breath with mid sternal chest pain since this morning.  EXAM: CT ANGIOGRAPHY CHEST WITH CONTRAST  TECHNIQUE: Multidetector CT imaging of the chest was performed using the standard protocol during bolus administration of intravenous contrast. Multiplanar CT image reconstructions and MIPs were obtained to evaluate the vascular anatomy.  CONTRAST:  171mL OMNIPAQUE IOHEXOL 350 MG/ML SOLN  COMPARISON:  June 25, 2014  FINDINGS: There is no pulmonary embolus. The heart size is normal. There is no pericardial effusion. The aorta is normal. No mediastinal or hilar lymphadenopathy is identified. There is no pulmonary consolidation or pleural effusion. No pulmonary mass is identified. Minimal dependent atelectasis of the posterior right lung base is identified. The visualized upper abdominal  structures are unremarkable. The patient is status post prior fixation of the lower thoracic spine.  Review of the MIP images confirms the above findings.  IMPRESSION: No pulmonary embolus.  No acute cardiopulmonary disease identified.   Electronically Signed   By: Abelardo Diesel M.D.   On: 02/09/2015 18:05   Dg Abd Acute W/chest  02/09/2015   CLINICAL DATA:  Severe upper abdomen pain and nausea.  EXAM: DG ABDOMEN ACUTE W/ 1V CHEST  COMPARISON:  February 09, 2015 chest x-ray  FINDINGS: There is no evidence of dilated bowel loops or free intraperitoneal air. Prior cholecystectomy clips are noted. Contrast is  noted bladder from recent CT. No radiopaque calculi or other significant radiographic abnormality is seen. Heart size and mediastinal contours are within normal limits. Both lungs are clear.  IMPRESSION: No bowel obstruction or free air.  No acute cardiopulmonary disease.   Electronically Signed   By: Abelardo Diesel M.D.   On: 02/09/2015 21:01    EKG: Independently reviewed. Sinus arrhythmia   Assessment/Plan Active Problems:   Hypertension   GERD (gastroesophageal reflux disease)   Anxiety disorder   Epigastric pain   Vomiting   Nausea & vomiting  Nausea and vomiting Patient has intractable  nausea vomiting, we will keep the patient nothing by mouth Start IV fluids with normal saline +20 of KCl  Zofran when necessary for nausea vomiting Dilaudid when necessary for pain  GERD Start IV Protonix 40 mg daily  Hypertension Blood pressure has been elevated, will continue home medications including lisinopril, Cardizem Hydralazine 25 g by mouth every 6 hours when necessary for BP greater than 160/100.  Code status: Full code  Family discussion: Admission, patients condition and plan of care including tests being ordered have been discussed with the patient and *her mother at bedside* who indicate understanding and agree with the plan and Code Status.   Time Spent on Admission: 60  minutes  [facility] Pager: [phone] 02/09/2015, 10:45 PM  If 7PM-7AM, please contact night-coverage  [email or web address]  Password [email or web address]

## 2015-02-09 NOTE — ED Notes (Signed)
Patient lying quietly w/eyes closed.  Respirations nonlabored and even.

## 2015-02-09 NOTE — ED Notes (Signed)
Patient vomited in emesis bag. Discarded emesis bag and gave patient new bag. Patient complaining of upper abdominal pain radiating into chest area. States she was discharged here this evening and went and ate a cheeseburger. States pain started immediately after eating.

## 2015-02-09 NOTE — ED Notes (Signed)
Gave patient ice water for fluid challenge.

## 2015-02-09 NOTE — ED Notes (Signed)
Pt states she was discharged a few minutes ago with acid reflux, went to eat a burger and started vomiting again

## 2015-02-09 NOTE — ED Notes (Signed)
Patient c/o CP and feeling anxious.  Olga Millers, PA-C made aware.  Patient also requesting something to eat.  Provided meal tray.

## 2015-02-09 NOTE — ED Notes (Signed)
Patient drank ice water with no difficulty or complaints. No vomiting noted since fluid challenge.

## 2015-02-09 NOTE — ED Notes (Signed)
Patient developed SOB, midsternal chest sharp pain, arm tingling and palpitations since 1000 this morning.  Stopped taking her 10mg  Valium daily last week as she felt she no longer needed it.  Has not had any similar symptoms since stopping the Valium until this morning.   Associated lightheadedness and nausea.

## 2015-02-09 NOTE — ED Provider Notes (Signed)
CSN: [id number]     Arrival date & time 02/09/15  1916 History   First MD Initiated Contact with Patient 02/09/15 1929     Chief Complaint  Patient presents with  . Abdominal Pain     Patient is a 49 y.o. female presenting with abdominal pain. The history is provided by the patient. No language interpreter was used.  Abdominal Pain  Ms. Garfinkel presents for evaluation of abdominal pain. She states that when she awoke around 9:30 this morning she had central chest discomfort, shortness of breath, tingling and cramping in bilateral hands. She describes this as a panic attack type  feeling. She also had associated vomiting, nausea, epigastric abdominal pain. She was seen in the emergency department earlier today and discharged home and then went to get a hamburger and ate about half of it and developed recurrent epigastric pain as well as vomiting. She denies any fevers, dysuria, constipation. Her last BM was last night. She states that she feels as if she needs to have a BM but cannot.  Past Medical History  Diagnosis Date  . Asthma   . COPD (chronic obstructive pulmonary disease)   . Hypertension   . GERD (gastroesophageal reflux disease)   . Anxiety disorder   . Chronic pain syndrome   . Anxiety   . Depression   . Fibromyalgia   . Epigastric pain 03/29/2013  . Fatigue 03/29/2013  . Shortness of breath dyspnea    Past Surgical History  Procedure Laterality Date  . Cholecystectomy    . Cesarean section    . Carpal tunnel release    . Esophagogastroduodenoscopy  07/31/2011    Procedure: ESOPHAGOGASTRODUODENOSCOPY (EGD);  Surgeon: Rogene Houston, MD;  [facility]: [facility];  Service: Endoscopy;  Laterality: N/A;  Venia Minks dilation  07/31/2011    Procedure: Venia Minks DILATION;  Surgeon: Rogene Houston, MD;  Location: [facility];  Service: Endoscopy;;  . Endometrial ablation    . Mass excision  05/20/2012    Procedure: EXCISION MASS;  Surgeon: Sanjuana Kava, MD;  Location: [facility];   Service: Orthopedics;  Laterality: Left;  Repair of Fascial Defect Left Knee  . Carpal tunnel release  07/23/2012    Procedure: CARPAL TUNNEL RELEASE;  Surgeon: Sanjuana Kava, MD;  Location: [facility];  Service: Orthopedics;  Laterality: Left;  . Esophagogastroduodenoscopy (egd) with esophageal dilation N/A 09/30/2012    Procedure: ESOPHAGOGASTRODUODENOSCOPY (EGD) WITH ESOPHAGEAL DILATION;  Surgeon: Rogene Houston, MD;  [facility];  Service: Endoscopy;  Laterality: N/A;  245  . Knee surgery Left   . Esophagogastroduodenoscopy N/A 12/31/2013    Procedure: ESOPHAGOGASTRODUODENOSCOPY (EGD);  Surgeon: Rogene Houston, MD;  [facility];  Service: Endoscopy;  Laterality: N/A;  120-moved to 925 Ann to notify pt  . Balloon dilation N/A 12/31/2013    Procedure: BALLOON DILATION;  Surgeon: Rogene Houston, MD;  [facility]: [facility];  Service: Endoscopy;  Laterality: N/A;  Venia Minks dilation N/A 12/31/2013    Procedure: Venia Minks DILATION;  Surgeon: Rogene Houston, MD;  [facility]: [facility];  Service: Endoscopy;  Laterality: N/A;  . Savory dilation N/A 12/31/2013    Procedure: SAVORY DILATION;  Surgeon: Rogene Houston, MD;  [facility];  Service: Endoscopy;  Laterality: N/A;  . Cyst removed Left     had surgery to removed cyst  . Spine surgery     Family History  Problem Relation Age of Onset  . Asthma Mother   .  Asthma Father   . Cancer Father    History  Substance Use Topics  . Smoking status: Former Smoker    Types: Cigarettes    Quit date: 10/26/2011  . Smokeless tobacco: Never Used  . Alcohol Use: 1.2 oz/week    2 Cans of beer per week     Comment: social   OB History    Gravida Para Term Preterm AB TAB SAB Ectopic Multiple Living   2 2        1      Review of Systems  Gastrointestinal: Positive for abdominal pain.  All other systems reviewed and are negative.     Allergies  Review of patient's allergies indicates no known allergies.  Home  Medications   Prior to Admission medications   Medication Sig Start Date End Date Taking? Authorizing Provider  albuterol (PROVENTIL HFA;VENTOLIN HFA) 108 (90 BASE) MCG/ACT inhaler Inhale 2 puffs into the lungs every 6 (six) hours as needed for wheezing or shortness of breath. For shortness of breath Patient taking differently: Inhale 2 puffs into the lungs every 6 (six) hours as needed for wheezing or shortness of breath.  07/31/11  Yes Kathie Dike, MD  albuterol (PROVENTIL) (2.5 MG/3ML) 0.083% nebulizer solution Take 2.5 mg by nebulization every 6 (six) hours as needed for wheezing or shortness of breath.    Yes Historical Provider, MD  citalopram (CELEXA) 20 MG tablet Take 20 mg by mouth daily.   Yes Historical Provider, MD  diazepam (VALIUM) 10 MG tablet Take 1 tablet (10 mg total) by mouth every 8 (eight) hours as needed for anxiety (also for muscle spasms). 09/26/14  Yes Melina Schools, MD  diltiazem (CARDIZEM CD) 240 MG 24 hr capsule Take 240 mg by mouth daily.   Yes Historical Provider, MD  famotidine (PEPCID) 20 MG tablet Take 1 tablet (20 mg total) by mouth 2 (two) times daily. 02/09/15  Yes Almyra Free Idol, PA-C  lisinopril (PRINIVIL,ZESTRIL) 20 MG tablet Take 1 tablet (20 mg total) by mouth daily. 07/31/11  Yes Kathie Dike, MD  mometasone-formoterol (DULERA) 200-5 MCG/ACT AERO Inhale 2 puffs into the lungs 2 (two) times daily.   Yes Historical Provider, MD  Omega-3 Fatty Acids (FISH OIL) 1200 MG CAPS Take 1,200 mg by mouth daily.    Yes Historical Provider, MD  omeprazole (PRILOSEC) 20 MG capsule Take 1 capsule (20 mg total) by mouth daily. 06/25/14  Yes Ezequiel Essex, MD  oxyCODONE-acetaminophen (PERCOCET) 10-325 MG per tablet Take 1-2 tablets by mouth every 4 (four) hours as needed for pain.    Yes Historical Provider, MD  pantoprazole (PROTONIX) 40 MG tablet TAKE 1 TABLET BY MOUTH TWICE DAILY BEFORE A MEAL. 08/03/14  Yes Rogene Houston, MD  phentermine 37.5 MG capsule Take 37.5 mg by  mouth daily.   Yes Historical Provider, MD  promethazine (PHENERGAN) 25 MG tablet Take 1 tablet (25 mg total) by mouth every 6 (six) hours as needed for nausea or vomiting. 02/09/15   Evalee Jefferson, PA-C   BP 196/90 mmHg  Temp(Src) 97.5 F (36.4 C) (Oral)  Resp 20  Ht 5\' 4"  (1.626 m)  Wt 155 lb (70.308 kg)  BMI 26.59 kg/m2  SpO2 100% Physical Exam  Constitutional: She is oriented to person, place, and time. She appears well-developed and well-nourished.  HENT:  Head: Normocephalic and atraumatic.  Cardiovascular: Normal rate and regular rhythm.   No murmur heard. Pulmonary/Chest: Effort normal and breath sounds normal. No respiratory distress.  Abdominal: Soft. Bowel sounds  are normal. There is no rebound and no guarding.  Mild epigastric tenderness  Musculoskeletal: She exhibits no edema or tenderness.  Neurological: She is alert and oriented to person, place, and time.  Skin: Skin is warm and dry.  Psychiatric:  Anxious  Nursing note and vitals reviewed.   ED Course  Procedures (including critical care time) Labs Review Labs Reviewed - No data to display  Imaging Review Dg Chest 2 View  02/09/2015   CLINICAL DATA:  Epigastric pain and vomiting  EXAM: CHEST - 2 VIEW  COMPARISON:  09/23/2014  FINDINGS: Cardiac shadow is within normal limits. The lungs are well aerated bilaterally. Bilateral nipple shadows are noted. No focal infiltrate is seen. Postsurgical changes in the thoracolumbar spine are noted.  IMPRESSION: No acute abnormality noted.  Bilateral nipple shadows seen.   Electronically Signed   By: Inez Catalina M.D.   On: 02/09/2015 11:53   Ct Angio Chest Pe W/cm &/or Wo Cm  02/09/2015   CLINICAL DATA:  Shortness of breath with mid sternal chest pain since this morning.  EXAM: CT ANGIOGRAPHY CHEST WITH CONTRAST  TECHNIQUE: Multidetector CT imaging of the chest was performed using the standard protocol during bolus administration of intravenous contrast. Multiplanar CT image  reconstructions and MIPs were obtained to evaluate the vascular anatomy.  CONTRAST:  154mL OMNIPAQUE IOHEXOL 350 MG/ML SOLN  COMPARISON:  June 25, 2014  FINDINGS: There is no pulmonary embolus. The heart size is normal. There is no pericardial effusion. The aorta is normal. No mediastinal or hilar lymphadenopathy is identified. There is no pulmonary consolidation or pleural effusion. No pulmonary mass is identified. Minimal dependent atelectasis of the posterior right lung base is identified. The visualized upper abdominal structures are unremarkable. The patient is status post prior fixation of the lower thoracic spine.  Review of the MIP images confirms the above findings.  IMPRESSION: No pulmonary embolus.  No acute cardiopulmonary disease identified.   Electronically Signed   By: Abelardo Diesel M.D.   On: 02/09/2015 18:05     EKG Interpretation   Date/Time:  Thursday February 09 2015 20:03:38 EDT Ventricular Rate:  76 PR Interval:  154 QRS Duration: 76 QT Interval:  434 QTC Calculation: 488 R Axis:   51 Text Interpretation:  Sinus arrhythmia Abnormal R-wave progression, early  transition Borderline prolonged QT interval Confirmed by Hazle Coca [phone])  on 02/09/2015 8:11:17 PM      MDM   Final diagnoses:  Intractable vomiting with nausea, vomiting of unspecified type    Patient with history of hypertension here for chest pain and abdominal pain with recurrent vomiting after leaving the department. Reviewed visit from earlier today with prior lab studies. History and presentation is not consistent with acute bowel obstruction, ACS. Patient refused rectal exam.  Patient with recurrent vomiting in the department. Discussed hospitalist regarding admission for uncontrolled vomiting.  Quintella Reichert, MD 02/10/15 0010

## 2015-02-09 NOTE — Discharge Instructions (Signed)
Chest Pain (Nonspecific) °It is often hard to give a specific diagnosis for the cause of chest pain. There is always a chance that your pain could be related to something serious, such as a heart attack or a blood clot in the lungs. You need to follow up with your health care provider for further evaluation. °CAUSES  °· Heartburn. °· Pneumonia or bronchitis. °· Anxiety or stress. °· Inflammation around your heart (pericarditis) or lung (pleuritis or pleurisy). °· A blood clot in the lung. °· A collapsed lung (pneumothorax). It can develop suddenly on its own (spontaneous pneumothorax) or from trauma to the chest. °· Shingles infection (herpes zoster virus). °The chest wall is composed of bones, muscles, and cartilage. Any of these can be the source of the pain. °· The bones can be bruised by injury. °· The muscles or cartilage can be strained by coughing or overwork. °· The cartilage can be affected by inflammation and become sore (costochondritis). °DIAGNOSIS  °Lab tests or other studies may be needed to find the cause of your pain. Your health care provider may have you take a test called an ambulatory electrocardiogram (ECG). An ECG records your heartbeat patterns over a 24-hour period. You may also have other tests, such as: °· Transthoracic echocardiogram (TTE). During echocardiography, sound waves are used to evaluate how blood flows through your heart. °· Transesophageal echocardiogram (TEE). °· Cardiac monitoring. This allows your health care provider to monitor your heart rate and rhythm in real time. °· Holter monitor. This is a portable device that records your heartbeat and can help diagnose heart arrhythmias. It allows your health care provider to track your heart activity for several days, if needed. °· Stress tests by exercise or by giving medicine that makes the heart beat faster. °TREATMENT  °· Treatment depends on what may be causing your chest pain. Treatment may include: °· Acid blockers for  heartburn. °· Anti-inflammatory medicine. °· Pain medicine for inflammatory conditions. °· Antibiotics if an infection is present. °· You may be advised to change lifestyle habits. This includes stopping smoking and avoiding alcohol, caffeine, and chocolate. °· You may be advised to keep your head raised (elevated) when sleeping. This reduces the chance of acid going backward from your stomach into your esophagus. °Most of the time, nonspecific chest pain will improve within 2-3 days with rest and mild pain medicine.  °HOME CARE INSTRUCTIONS  °· If antibiotics were prescribed, take them as directed. Finish them even if you start to feel better. °· For the next few days, avoid physical activities that bring on chest pain. Continue physical activities as directed. °· Do not use any tobacco products, including cigarettes, chewing tobacco, or electronic cigarettes. °· Avoid drinking alcohol. °· Only take medicine as directed by your health care provider. °· Follow your health care provider's suggestions for further testing if your chest pain does not go away. °· Keep any follow-up appointments you made. If you do not go to an appointment, you could develop lasting (chronic) problems with pain. If there is any problem keeping an appointment, call to reschedule. °SEEK MEDICAL CARE IF:  °· Your chest pain does not go away, even after treatment. °· You have a rash with blisters on your chest. °· You have a fever. °SEEK IMMEDIATE MEDICAL CARE IF:  °· You have increased chest pain or pain that spreads to your arm, neck, jaw, back, or abdomen. °· You have shortness of breath. °· You have an increasing cough, or you cough   up blood.  You have severe back or abdominal pain.  You feel nauseous or vomit.  You have severe weakness.  You faint.  You have chills. This is an emergency. Do not wait to see if the pain will go away. Get medical help at once. Call your local emergency services (911 in [place] Do not drive  yourself to the hospital. MAKE SURE YOU:   Understand these instructions.  Will watch your condition.  Will get help right away if you are not doing well or get worse. Document Released: 04/10/2005 Document Revised: 07/06/2013 Document Reviewed: 02/04/2008 [facility] Patient Information 2015 [place], [place]. This information is not intended to replace advice given to you by your health care provider. Make sure you discuss any questions you have with your health care provider.  Nausea and Vomiting Nausea is a sick feeling that often comes before throwing up (vomiting). Vomiting is a reflex where stomach contents come out of your mouth. Vomiting can cause severe loss of body fluids (dehydration). Children and elderly adults can become dehydrated quickly, especially if they also have diarrhea. Nausea and vomiting are symptoms of a condition or disease. It is important to find the cause of your symptoms. CAUSES   Direct irritation of the stomach lining. This irritation can result from increased acid production (gastroesophageal reflux disease), infection, food poisoning, taking certain medicines (such as nonsteroidal anti-inflammatory drugs), alcohol use, or tobacco use.  Signals from the brain.These signals could be caused by a headache, heat exposure, an inner ear disturbance, increased pressure in the brain from injury, infection, a tumor, or a concussion, pain, emotional stimulus, or metabolic problems.  An obstruction in the gastrointestinal tract (bowel obstruction).  Illnesses such as diabetes, hepatitis, gallbladder problems, appendicitis, kidney problems, cancer, sepsis, atypical symptoms of a heart attack, or eating disorders.  Medical treatments such as chemotherapy and radiation.  Receiving medicine that makes you sleep (general anesthetic) during surgery. DIAGNOSIS Your caregiver may ask for tests to be done if the problems do not improve after a few days. Tests may also be done if  symptoms are severe or if the reason for the nausea and vomiting is not clear. Tests may include:  Urine tests.  Blood tests.  Stool tests.  Cultures (to look for evidence of infection).  X-rays or other imaging studies. Test results can help your caregiver make decisions about treatment or the need for additional tests. TREATMENT You need to stay well hydrated. Drink frequently but in small amounts.You may wish to drink water, sports drinks, clear broth, or eat frozen ice pops or gelatin dessert to help stay hydrated.When you eat, eating slowly may help prevent nausea.There are also some antinausea medicines that may help prevent nausea. HOME CARE INSTRUCTIONS   Take all medicine as directed by your caregiver.  If you do not have an appetite, do not force yourself to eat. However, you must continue to drink fluids.  If you have an appetite, eat a normal diet unless your caregiver tells you differently.  Eat a variety of complex carbohydrates (rice, wheat, potatoes, bread), lean meats, yogurt, fruits, and vegetables.  Avoid high-fat foods because they are more difficult to digest.  Drink enough water and fluids to keep your urine clear or pale yellow.  If you are dehydrated, ask your caregiver for specific rehydration instructions. Signs of dehydration may include:  Severe thirst.  Dry lips and mouth.  Dizziness.  Dark urine.  Decreasing urine frequency and amount.  Confusion.  Rapid breathing or  pulse. SEEK IMMEDIATE MEDICAL CARE IF:   You have blood or brown flecks (like coffee grounds) in your vomit.  You have black or bloody stools.  You have a severe headache or stiff neck.  You are confused.  You have severe abdominal pain.  You have chest pain or trouble breathing.  You do not urinate at least once every 8 hours.  You develop cold or clammy skin.  You continue to vomit for longer than 24 to 48 hours.  You have a fever. MAKE SURE YOU:    Understand these instructions.  Will watch your condition.  Will get help right away if you are not doing well or get worse. Document Released: 07/01/2005 Document Revised: 09/23/2011 Document Reviewed: 11/28/2010 [facility] Patient Information 2015 [place], [place]. This information is not intended to replace advice given to you by your health care provider. Make sure you discuss any questions you have with your health care provider.   Your lab tests, ekg, xrays and CT scans are negative today for the source of your pain.  Continue using your home medications including your Protonix and your oxycodone if you need this for pain relief.  Add the pepcid to help eliminate your GERD being the source of your symptoms.  You have also been prescribed phenergan if needed for continued nausea.  Plan to see your primary doctor for a recheck in 24 hours if your symptoms persist.

## 2015-02-09 NOTE — ED Notes (Signed)
Patient stated nausea was relieved, but while in room administering pain med, patient began heaving.

## 2015-02-09 NOTE — ED Notes (Signed)
Ambulated to BR. Patient vomited yellow stomach contents shortly after taking GI cocktail; however, she now reports nausea is relieved.

## 2015-02-09 NOTE — ED Notes (Signed)
Patient with continued c/o pain at time. Respirations even and unlabored. Skin warm/dry. Discharge instructions reviewed with patient at this time. Patient given opportunity to voice concerns/ask questions. IV removed per policy and band-aid applied to site. Patient discharged at this time and left Emergency Department with steady gait.

## 2015-02-09 NOTE — ED Notes (Signed)
Patient was lying quietly on bed, respirations easy and nonlabored.  When awakened by touch, patient states she has been "tossing and turning" and that Ativan has not helped chest discomfort. Was unable to eat meal tray d/t dry heaving. Olga Millers, PA-C notified.

## 2015-02-09 NOTE — ED Provider Notes (Signed)
CSN: [id number]     Arrival date & time 02/09/15  1048 History   First MD Initiated Contact with Patient 02/09/15 1055     Chief Complaint  Patient presents with  . Anxiety  . Chest Pain     (Consider location/radiation/quality/duration/timing/severity/associated sxs/prior Treatment) The history is provided by the patient and the spouse.   [name] is a 49 y.o. female  with a past medical history significant for hypertension, GERD, COPD and anxiety presenting with sudden onset of midsternal sharp chest pain one hour before arrival.  She had just cooked breakfast for her husband and was sitting when this pain started.  She endorses her pain has been constant without radiation and has progressed to include shortness of breath palpitations and tingling in her fingertips and feelings of lightheadedness.    She has been nauseated with multiple episodes of emesis since arriving here.  She denies peripheral edema, leg swelling, abdominal pain, diarrhea.  She reports feeling dehydrated.  She has had no medications prior to arrival.  She does endorse that her symptoms are somewhat similar to prior panic attacks. She stopped taking her Valium which she was using daily at bedtime one week ago as she felt she no longer needed this medication. She denies dysuria, hematuria, diarrhea.    Past Medical History  Diagnosis Date  . Asthma   . COPD (chronic obstructive pulmonary disease)   . Hypertension   . GERD (gastroesophageal reflux disease)   . Anxiety disorder   . Chronic pain syndrome   . Anxiety   . Depression   . Fibromyalgia   . Epigastric pain 03/29/2013  . Fatigue 03/29/2013  . Shortness of breath dyspnea    Past Surgical History  Procedure Laterality Date  . Cholecystectomy    . Cesarean section    . Carpal tunnel release    . Esophagogastroduodenoscopy  07/31/2011    Procedure: ESOPHAGOGASTRODUODENOSCOPY (EGD);  Surgeon: Rogene Houston, MD;  [facility]: [facility];  Service:  Endoscopy;  Laterality: N/A;  Venia Minks dilation  07/31/2011    Procedure: Venia Minks DILATION;  Surgeon: Rogene Houston, MD;  Location: [facility];  Service: Endoscopy;;  . Endometrial ablation    . Mass excision  05/20/2012    Procedure: EXCISION MASS;  Surgeon: Sanjuana Kava, MD;  Location: [facility];  Service: Orthopedics;  Laterality: Left;  Repair of Fascial Defect Left Knee  . Carpal tunnel release  07/23/2012    Procedure: CARPAL TUNNEL RELEASE;  Surgeon: Sanjuana Kava, MD;  Location: [facility];  Service: Orthopedics;  Laterality: Left;  . Esophagogastroduodenoscopy (egd) with esophageal dilation N/A 09/30/2012    Procedure: ESOPHAGOGASTRODUODENOSCOPY (EGD) WITH ESOPHAGEAL DILATION;  Surgeon: Rogene Houston, MD;  [facility]: [facility];  Service: Endoscopy;  Laterality: N/A;  245  . Knee surgery Left   . Esophagogastroduodenoscopy N/A 12/31/2013    Procedure: ESOPHAGOGASTRODUODENOSCOPY (EGD);  Surgeon: Rogene Houston, MD;  [facility]: [facility];  Service: Endoscopy;  Laterality: N/A;  120-moved to 925 Ann to notify pt  . Balloon dilation N/A 12/31/2013    Procedure: BALLOON DILATION;  Surgeon: Rogene Houston, MD;  [facility]: [facility];  Service: Endoscopy;  Laterality: N/A;  Venia Minks dilation N/A 12/31/2013    Procedure: Venia Minks DILATION;  Surgeon: Rogene Houston, MD;  [facility]: [facility];  Service: Endoscopy;  Laterality: N/A;  . Savory dilation N/A 12/31/2013    Procedure: SAVORY DILATION;  Surgeon: Rogene Houston, MD;  [facility]:  [facility];  Service: Endoscopy;  Laterality: N/A;  . Cyst removed Left     had surgery to removed cyst  . Spine surgery     Family History  Problem Relation Age of Onset  . Asthma Mother   . Asthma Father   . Cancer Father    History  Substance Use Topics  . Smoking status: Former Smoker    Types: Cigarettes    Quit date: 10/26/2011  . Smokeless tobacco: Never Used  . Alcohol Use: 1.2 oz/week    2 Cans of beer per week     Comment: social    OB History    Gravida Para Term Preterm AB TAB SAB Ectopic Multiple Living   2 2        1      Review of Systems  Constitutional: Negative for fever.  HENT: Negative for congestion and sore throat.   Eyes: Negative.   Respiratory: Positive for shortness of breath. Negative for chest tightness.   Cardiovascular: Positive for chest pain.  Gastrointestinal: Positive for nausea and vomiting. Negative for abdominal pain.  Genitourinary: Negative.   Musculoskeletal: Negative for joint swelling, arthralgias and neck pain.  Skin: Negative.  Negative for rash and wound.  Neurological: Positive for light-headedness and numbness. Negative for dizziness, weakness and headaches.  Psychiatric/Behavioral: The patient is nervous/anxious.       Allergies  Review of patient's allergies indicates no known allergies.  Home Medications   Prior to Admission medications   Medication Sig Start Date End Date Taking? Authorizing Provider  albuterol (PROVENTIL HFA;VENTOLIN HFA) 108 (90 BASE) MCG/ACT inhaler Inhale 2 puffs into the lungs every 6 (six) hours as needed for wheezing or shortness of breath. For shortness of breath Patient taking differently: Inhale 2 puffs into the lungs every 6 (six) hours as needed for wheezing or shortness of breath.  07/31/11   Kathie Dike, MD  albuterol (PROVENTIL) (2.5 MG/3ML) 0.083% nebulizer solution Take 2.5 mg by nebulization every 6 (six) hours as needed for wheezing or shortness of breath.     Historical Provider, MD  citalopram (CELEXA) 20 MG tablet Take 20 mg by mouth daily.    Historical Provider, MD  diazepam (VALIUM) 10 MG tablet Take 1 tablet (10 mg total) by mouth every 8 (eight) hours as needed for anxiety (also for muscle spasms). 09/26/14   Melina Schools, MD  diltiazem (CARDIZEM CD) 240 MG 24 hr capsule Take 240 mg by mouth daily.    Historical Provider, MD  lisinopril (PRINIVIL,ZESTRIL) 20 MG tablet Take 1 tablet (20 mg total) by mouth daily. 07/31/11    Kathie Dike, MD  magnesium citrate SOLN Take 296 mLs (1 Bottle total) by mouth once. 09/26/14   Melina Schools, MD  mometasone-formoterol (DULERA) 200-5 MCG/ACT AERO Inhale 2 puffs into the lungs 2 (two) times daily.    Historical Provider, MD  Omega-3 Fatty Acids (FISH OIL) 1200 MG CAPS Take 1,200 mg by mouth daily.     Historical Provider, MD  omeprazole (PRILOSEC) 20 MG capsule Take 1 capsule (20 mg total) by mouth daily. 06/25/14   Ezequiel Essex, MD  oxyCODONE-acetaminophen (PERCOCET) 10-325 MG per tablet Take 1-2 tablets by mouth every 4 (four) hours as needed for pain.     Historical Provider, MD  pantoprazole (PROTONIX) 40 MG tablet TAKE 1 TABLET BY MOUTH TWICE DAILY BEFORE A MEAL. 08/03/14   Rogene Houston, MD  phentermine 37.5 MG capsule Take 37.5 mg by mouth daily.  Historical Provider, MD   BP 180/105 mmHg  Pulse 70  Temp(Src) 98.6 F (37 C) (Oral)  Resp 22  Ht 5\' 4"  (1.626 m)  Wt 150 lb (68.04 kg)  BMI 25.73 kg/m2  SpO2 100% Physical Exam  Constitutional: She appears well-developed and well-nourished.  Actively vomiting during initial exam  HENT:  Head: Normocephalic and atraumatic.  Eyes: Conjunctivae are normal.  Cardiovascular: Normal rate, regular rhythm, normal heart sounds and intact distal pulses.   Pulmonary/Chest: Effort normal and breath sounds normal. She has no wheezes. She has no rales.  Rhonchi right mid lung field.  Abdominal: Soft. Bowel sounds are normal. She exhibits no distension. There is tenderness in the right upper quadrant, epigastric area and left upper quadrant. There is no guarding.  Musculoskeletal: Normal range of motion. She exhibits no edema or tenderness.  Neurological: She is alert.  Skin: Skin is warm and dry.  Psychiatric: She has a normal mood and affect.  Nursing note and vitals reviewed.   ED Course  Procedures (including critical care time) Labs Review Labs Reviewed  COMPREHENSIVE METABOLIC PANEL - Abnormal; Notable for  the following:    Potassium 3.3 (*)    CO2 20 (*)    Glucose, Bld 141 (*)    All other components within normal limits  LIPASE, BLOOD - Abnormal; Notable for the following:    Lipase 18 (*)    All other components within normal limits  CBC WITH DIFFERENTIAL/PLATELET  TROPONIN I  TROPONIN I    Imaging Review No results found.   EKG Interpretation None     ED ECG REPORT   Date: 02/09/2015  Rate: 73  Rhythm: normal sinus rhythm  QRS Axis: normal  Intervals: borderline prolonged QT  ST/T Wave abnormalities: normal  Conduction Disutrbances:none  Narrative Interpretation:   Old EKG Reviewed: unchanged except for longer QT interval  I have personally reviewed the EKG tracing and agree with the computerized printout as noted.  MDM   Final diagnoses:  None     Medications  aspirin 81 MG chewable tablet (not administered)  sodium chloride 0.9 % bolus 500 mL (not administered)  ondansetron (ZOFRAN) injection 4 mg (4 mg Intravenous Given 02/09/15 1114)  sodium chloride 0.9 % bolus 500 mL (0 mLs Intravenous Stopped 02/09/15 1152)  aspirin chewable tablet 324 mg (324 mg Oral Given 02/09/15 1124)  LORazepam (ATIVAN) injection 0.5 mg (0.5 mg Intravenous Given 02/09/15 1124)  promethazine (PHENERGAN) injection 12.5 mg (12.5 mg Intravenous Given 02/09/15 1209)  morphine 4 MG/ML injection 4 mg (4 mg Intravenous Given 02/09/15 1241)  LORazepam (ATIVAN) injection 1 mg (1 mg Intravenous Given 02/09/15 1438)  HYDROmorphone (DILAUDID) injection 1 mg (1 mg Intravenous Given 02/09/15 1533)  diltiazem (CARDIZEM) tablet 120 mg (120 mg Oral Given 02/09/15 1537)  lisinopril (PRINIVIL,ZESTRIL) tablet 20 mg (20 mg Oral Given 02/09/15 1537)  gi cocktail (Maalox,Lidocaine,Donnatal) (30 mLs Oral Given 02/09/15 1609)  promethazine (PHENERGAN) injection 12.5 mg (12.5 mg Intravenous Given 02/09/15 1609)    Pt was given multiple doses of pain and anti emetic medications with transient relief.  She was also  given her bp meds which she vomited this am.  Labs and xrays reviewed and unremarkable including delta trops without change and within normal limits.  She reports continued feeling of sob, worsened pain with deep inspiration, she has o2 sats of 100% on room air but had a a transient drop to 88% early during her ed stay. Re-exam abd nontender, no guarding, no  acute abd.  Initial rhonchi appreciated at first exam are gone.  Pt appears comfortable although endorses continued pain despite multiple doses of narcotic pain meds, ativan, toradol.   Plan cxr negative.  Will obtain Ct chest to rule out PE, and to assess for possible subtle new onset pneumonia not picking up on plain films.     6:12 PM Ct negative. Pt dispo'd with phenergan, pepcid, encouraged to continue taking her protonix. F/u with pcp in 24 hours for recheck if sx persist.  Evalee Jefferson, PA-C 02/09/15 1814  Tanna Furry, MD 02/11/15 [id number]

## 2015-02-09 NOTE — ED Notes (Signed)
Patient c/o feeling weak and dizzy when she sits up.

## 2015-02-09 NOTE — ED Notes (Signed)
Patient requesting pain medication. Advised Dr Ralene Bathe.

## 2015-02-10 DIAGNOSIS — K219 Gastro-esophageal reflux disease without esophagitis: Secondary | ICD-10-CM

## 2015-02-10 DIAGNOSIS — R1110 Vomiting, unspecified: Secondary | ICD-10-CM

## 2015-02-10 DIAGNOSIS — R1013 Epigastric pain: Secondary | ICD-10-CM

## 2015-02-10 LAB — COMPREHENSIVE METABOLIC PANEL
ALK PHOS: 84 U/L (ref 38–126)
ALT: 12 U/L — ABNORMAL LOW (ref 14–54)
AST: 18 U/L (ref 15–41)
Albumin: 3.9 g/dL (ref 3.5–5.0)
Anion gap: 7 (ref 5–15)
BUN: 12 mg/dL (ref 6–20)
CHLORIDE: 107 mmol/L (ref 101–111)
CO2: 25 mmol/L (ref 22–32)
CREATININE: 0.59 mg/dL (ref 0.44–1.00)
Calcium: 8.9 mg/dL (ref 8.9–10.3)
Glucose, Bld: 86 mg/dL (ref 65–99)
POTASSIUM: 3.1 mmol/L — AB (ref 3.5–5.1)
SODIUM: 139 mmol/L (ref 135–145)
TOTAL PROTEIN: 6.9 g/dL (ref 6.5–8.1)
Total Bilirubin: 0.7 mg/dL (ref 0.3–1.2)

## 2015-02-10 LAB — CBC
HEMATOCRIT: 37.1 % (ref 36.0–46.0)
Hemoglobin: 12.1 g/dL (ref 12.0–15.0)
MCH: 30.7 pg (ref 26.0–34.0)
MCHC: 32.6 g/dL (ref 30.0–36.0)
MCV: 94.2 fL (ref 78.0–100.0)
PLATELETS: 239 10*3/uL (ref 150–400)
RBC: 3.94 MIL/uL (ref 3.87–5.11)
RDW: 15.5 % (ref 11.5–15.5)
WBC: 6.5 10*3/uL (ref 4.0–10.5)

## 2015-02-10 MED ORDER — POTASSIUM CHLORIDE CRYS ER 20 MEQ PO TBCR
40.0000 meq | EXTENDED_RELEASE_TABLET | Freq: Once | ORAL | Status: AC
  Administered 2015-02-10: 40 meq via ORAL
  Filled 2015-02-10: qty 2

## 2015-02-10 MED ORDER — ONDANSETRON HCL 4 MG PO TABS: 4.0000 mg | ORAL_TABLET | Freq: Four times a day (QID) | ORAL | Status: DC | PRN

## 2015-02-10 MED ORDER — ENOXAPARIN SODIUM 40 MG/0.4ML ~~LOC~~ SOLN
40.0000 mg | SUBCUTANEOUS | Status: DC
  Administered 2015-02-10: 40 mg via SUBCUTANEOUS
  Filled 2015-02-10: qty 0.4

## 2015-02-10 MED ORDER — OMEPRAZOLE 20 MG PO CPDR: 20.0000 mg | DELAYED_RELEASE_CAPSULE | Freq: Every day | ORAL | Status: DC

## 2015-02-10 NOTE — Care Management Note (Signed)
Case Management Note  Patient Details  Name: ADRIEL DESROSIER MRN: [id number] Date of Birth: [birth year]  Subjective/Objective:                  Pt admitted from home with intractable nausea and vomiting. Pt lives with family and will return home at discharge. Pt is independent with ADL's.  Action/Plan: No CM needs noted.  Expected Discharge Date:                  Expected Discharge Plan:  Home/Self Care  In-House Referral:  NA  Discharge planning Services  CM Consult  Post Acute Care Choice:  NA Choice offered to:  NA  DME Arranged:    DME Agency:     HH Arranged:    HH Agency:     Status of Service:  Completed, signed off  Medicare Important Message Given:    Date Medicare IM Given:    Medicare IM give by:    Date Additional Medicare IM Given:    Additional Medicare Important Message give by:     If discussed at [place] of Stay Meetings, dates discussed:    Additional Comments:  Filippa, [place], RN 02/10/2015, 11:23 AM

## 2015-02-10 NOTE — Progress Notes (Signed)
BP 160/103. Hydralazine po ordered. Pt is NPO. Paged Dr Darrick Meigs. Dr Darrick Meigs said to wait until pt receives morning BP meds. Will continue to monitor pt. Bed remains in lowest position and call bell is within reach.[name]

## 2015-02-10 NOTE — Discharge Summary (Signed)
Physician Discharge Summary  [name]:[id number] DOB: 1965/11/03 DOA: 02/09/2015  PCP: [facility] date: 02/09/2015 Discharge date: 02/10/2015  Time spent: 45 minutes  Recommendations for Outpatient Follow-up:  -Will be discharged home today. -Advised to follow-up with primary care provider in 2 weeks.   Discharge Diagnoses:  Active Problems:   Hypertension   GERD (gastroesophageal reflux disease)   Anxiety disorder   Epigastric pain   Vomiting   Nausea & vomiting   Discharge Condition: Stable and improved  Filed Weights   02/09/15 1922 02/09/15 2356  Weight: 70.308 kg (155 lb) 68.312 kg (150 lb 9.6 oz)    History of present illness:  Today came to the hospital with complaints of abdominal pain with nausea vomiting. Patient says that the symptoms started this morning she had some chest discomfort epigastric pain and nausea vomiting. Patient says that she had 4-5 episodes of vomiting this morning patient was seen in the ED earlier today and was discharged home and then she went to get hamburger after she ate about half of it she again developed recurrent epigastric pain as well as vomiting. Patient has had cholecystectomy in the past. She denies any fever. No dysuria. Patient says that she used to get these symptoms when she gets upset. She has been taking Celexa and Valium for anxiety and depression. In the ED chest x-ray showed no pneumonia, CT angiogram of the chest was done which was negative for pulmonary embolism. Lipase 18  Hospital Course:   Nausea and vomiting -Resolved since admission, has tolerated clear liquids and is requesting advanced diet. -I see no indication for keeping her in the hospital as she is now improved.  Chest pain -Has ruled out for acute coronary syndrome with negative troponins and an EKG without acute ischemic abnormalities. -Suspect chest pain is GI in origin as she describes a sensation of burning in  her stomach that goes all the way up into her chest into her mouth. -Have recommended that she use omeprazole twice daily for at least 4-6 weeks and follow-up with her primary care physician.  Rest of chronic conditions have been stable, home medications have not been altered  Procedures:  None   Consultations:  None  Discharge Instructions  Discharge Instructions    Increase activity slowly    Complete by:  As directed             Medication List    STOP taking these medications        famotidine 20 MG tablet  Commonly known as:  PEPCID     pantoprazole 40 MG tablet  Commonly known as:  PROTONIX      TAKE these medications        albuterol (2.5 MG/3ML) 0.083% nebulizer solution  Commonly known as:  PROVENTIL  Take 2.5 mg by nebulization every 6 (six) hours as needed for wheezing or shortness of breath.     albuterol 108 (90 BASE) MCG/ACT inhaler  Commonly known as:  PROVENTIL HFA;VENTOLIN HFA  Inhale 2 puffs into the lungs every 6 (six) hours as needed for wheezing or shortness of breath. For shortness of breath     citalopram 20 MG tablet  Commonly known as:  CELEXA  Take 20 mg by mouth daily.     diazepam 10 MG tablet  Commonly known as:  VALIUM  Take 1 tablet (10 mg total) by mouth every 8 (eight) hours as needed for anxiety (also for muscle  spasms).     diltiazem 240 MG 24 hr capsule  Commonly known as:  CARDIZEM CD  Take 240 mg by mouth daily.     Fish Oil 1200 MG Caps  Take 1,200 mg by mouth daily.     lisinopril 20 MG tablet  Commonly known as:  PRINIVIL,ZESTRIL  Take 1 tablet (20 mg total) by mouth daily.     mometasone-formoterol 200-5 MCG/ACT Aero  Commonly known as:  DULERA  Inhale 2 puffs into the lungs 2 (two) times daily.     omeprazole 20 MG capsule  Commonly known as:  PRILOSEC  Take 1 capsule (20 mg total) by mouth daily.     ondansetron 4 MG tablet  Commonly known as:  ZOFRAN  Take 1 tablet (4 mg total) by mouth every 6 (six)  hours as needed for nausea.     oxyCODONE-acetaminophen 10-325 MG per tablet  Commonly known as:  PERCOCET  Take 1-2 tablets by mouth every 4 (four) hours as needed for pain.     phentermine 37.5 MG capsule  Take 37.5 mg by mouth daily.     promethazine 25 MG tablet  Commonly known as:  PHENERGAN  Take 1 tablet (25 mg total) by mouth every 6 (six) hours as needed for nausea or vomiting.       No Known Allergies     Follow-up Information    Follow up with [facility] In 2 weeks.   Specialty:  Family Medicine   Contact information:   [address] [place] [address] [phone]        The results of significant diagnostics from this hospitalization (including imaging, microbiology, ancillary and laboratory) are listed below for reference.    Significant Diagnostic Studies: Dg Chest 2 View  02/09/2015   CLINICAL DATA:  Epigastric pain and vomiting  EXAM: CHEST - 2 VIEW  COMPARISON:  09/23/2014  FINDINGS: Cardiac shadow is within normal limits. The lungs are well aerated bilaterally. Bilateral nipple shadows are noted. No focal infiltrate is seen. Postsurgical changes in the thoracolumbar spine are noted.  IMPRESSION: No acute abnormality noted.  Bilateral nipple shadows seen.   Electronically Signed   By: Inez Catalina M.D.   On: 02/09/2015 11:53   Ct Angio Chest Pe W/cm &/or Wo Cm  02/09/2015   CLINICAL DATA:  Shortness of breath with mid sternal chest pain since this morning.  EXAM: CT ANGIOGRAPHY CHEST WITH CONTRAST  TECHNIQUE: Multidetector CT imaging of the chest was performed using the standard protocol during bolus administration of intravenous contrast. Multiplanar CT image reconstructions and MIPs were obtained to evaluate the vascular anatomy.  CONTRAST:  144mL OMNIPAQUE IOHEXOL 350 MG/ML SOLN  COMPARISON:  June 25, 2014  FINDINGS: There is no pulmonary embolus. The heart size is normal. There is no pericardial effusion. The aorta is  normal. No mediastinal or hilar lymphadenopathy is identified. There is no pulmonary consolidation or pleural effusion. No pulmonary mass is identified. Minimal dependent atelectasis of the posterior right lung base is identified. The visualized upper abdominal structures are unremarkable. The patient is status post prior fixation of the lower thoracic spine.  Review of the MIP images confirms the above findings.  IMPRESSION: No pulmonary embolus.  No acute cardiopulmonary disease identified.   Electronically Signed   By: Abelardo Diesel M.D.   On: 02/09/2015 18:05   Dg Abd Acute W/chest  02/09/2015   CLINICAL DATA:  Severe upper abdomen pain and nausea.  EXAM: DG ABDOMEN ACUTE W/ 1V CHEST  COMPARISON:  February 09, 2015 chest x-ray  FINDINGS: There is no evidence of dilated bowel loops or free intraperitoneal air. Prior cholecystectomy clips are noted. Contrast is noted bladder from recent CT. No radiopaque calculi or other significant radiographic abnormality is seen. Heart size and mediastinal contours are within normal limits. Both lungs are clear.  IMPRESSION: No bowel obstruction or free air.  No acute cardiopulmonary disease.   Electronically Signed   By: Abelardo Diesel M.D.   On: 02/09/2015 21:01    Microbiology: No results found for this or any previous visit (from the past 240 hour(s)).   Labs: Basic Metabolic Panel:  Recent Labs Lab 02/09/15 1120 02/10/15 0707  NA 139 139  K 3.3* 3.1*  CL 104 107  CO2 20* 25  GLUCOSE 141* 86  BUN 13 12  CREATININE 0.75 0.59  CALCIUM 9.6 8.9   Liver Function Tests:  Recent Labs Lab 02/09/15 1120 02/10/15 0707  AST 28 18  ALT 18 12*  ALKPHOS 103 84  BILITOT 1.2 0.7  PROT 8.0 6.9  ALBUMIN 4.6 3.9    Recent Labs Lab 02/09/15 1120  LIPASE 18*   No results for input(s): AMMONIA in the last 168 hours. CBC:  Recent Labs Lab 02/09/15 1120 02/10/15 0707  WBC 4.9 6.5  NEUTROABS 3.5  --   HGB 14.2 12.1  HCT 42.3 37.1  MCV 92.6 94.2    PLT 257 239   Cardiac Enzymes:  Recent Labs Lab 02/09/15 1120 02/09/15 1408  TROPONINI <0.03 0.03   BNP: BNP (last 3 results) No results for input(s): BNP in the last 8760 hours.  ProBNP (last 3 results) No results for input(s): PROBNP in the last 8760 hours.  CBG: No results for input(s): GLUCAP in the last 168 hours.     SignedLelon Frohlich  [facility] Pager: [phone] 02/10/2015, 1:22 PM

## 2015-02-10 NOTE — Progress Notes (Signed)
NURSING PROGRESS NOTE  [name] [id number] Discharge Data: 02/10/2015 4:21 PM Attending Provider: No att. providers found [id number]   [name] to be D/C'd Home per MD order.    All IV's discontinued and monitored for bleeding.  All belongings returned to patient for patient to take home.  AVS summary and prescriptions reviewed with patient.  Patient left floor via wheelchair, escorted by NT.  Last Documented Vital Signs:  Blood pressure 160/103, pulse 65, temperature 98.2 F (36.8 C), temperature source Oral, resp. rate 18, height 5\' 4"  (1.626 m), weight 68.312 kg (150 lb 9.6 oz), last menstrual period 07/16/2007, SpO2 97 %.  [name]

## 2015-02-11 ENCOUNTER — Emergency Department (HOSPITAL_COMMUNITY)
Admission: EM | Admit: 2015-02-11 | Discharge: 2015-02-11 | Disposition: A | Discharge status: Home / Self Care | Payer: Medicaid Other | Attending: Emergency Medicine | Admitting: Emergency Medicine

## 2015-02-11 ENCOUNTER — Encounter (HOSPITAL_COMMUNITY): Payer: Self-pay | Admitting: *Deleted

## 2015-02-11 DIAGNOSIS — I10 Essential (primary) hypertension: Secondary | ICD-10-CM | POA: Diagnosis not present

## 2015-02-11 DIAGNOSIS — F419 Anxiety disorder, unspecified: Secondary | ICD-10-CM | POA: Insufficient documentation

## 2015-02-11 DIAGNOSIS — R1013 Epigastric pain: Secondary | ICD-10-CM | POA: Diagnosis not present

## 2015-02-11 DIAGNOSIS — Z9049 Acquired absence of other specified parts of digestive tract: Secondary | ICD-10-CM | POA: Insufficient documentation

## 2015-02-11 DIAGNOSIS — J441 Chronic obstructive pulmonary disease with (acute) exacerbation: Secondary | ICD-10-CM | POA: Diagnosis not present

## 2015-02-11 DIAGNOSIS — Z79899 Other long term (current) drug therapy: Secondary | ICD-10-CM | POA: Insufficient documentation

## 2015-02-11 DIAGNOSIS — F329 Major depressive disorder, single episode, unspecified: Secondary | ICD-10-CM | POA: Diagnosis not present

## 2015-02-11 DIAGNOSIS — G8929 Other chronic pain: Secondary | ICD-10-CM | POA: Insufficient documentation

## 2015-02-11 DIAGNOSIS — Z87891 Personal history of nicotine dependence: Secondary | ICD-10-CM | POA: Diagnosis not present

## 2015-02-11 DIAGNOSIS — K219 Gastro-esophageal reflux disease without esophagitis: Secondary | ICD-10-CM | POA: Insufficient documentation

## 2015-02-11 LAB — CBC WITH DIFFERENTIAL/PLATELET
BASOS ABS: 0 10*3/uL (ref 0.0–0.1)
Basophils Relative: 1 % (ref 0–1)
EOS ABS: 0.1 10*3/uL (ref 0.0–0.7)
EOS PCT: 1 % (ref 0–5)
HCT: 43.5 % (ref 36.0–46.0)
Hemoglobin: 14.6 g/dL (ref 12.0–15.0)
Lymphocytes Relative: 21 % (ref 12–46)
Lymphs Abs: 1.5 10*3/uL (ref 0.7–4.0)
MCH: 31.6 pg (ref 26.0–34.0)
MCHC: 33.6 g/dL (ref 30.0–36.0)
MCV: 94.2 fL (ref 78.0–100.0)
MONO ABS: 0.5 10*3/uL (ref 0.1–1.0)
Monocytes Relative: 8 % (ref 3–12)
Neutro Abs: 5 10*3/uL (ref 1.7–7.7)
Neutrophils Relative %: 69 % (ref 43–77)
PLATELETS: 243 10*3/uL (ref 150–400)
RBC: 4.62 MIL/uL (ref 3.87–5.11)
RDW: 14.6 % (ref 11.5–15.5)
WBC: 7.1 10*3/uL (ref 4.0–10.5)

## 2015-02-11 LAB — COMPREHENSIVE METABOLIC PANEL
ALT: 19 U/L (ref 14–54)
AST: 34 U/L (ref 15–41)
Albumin: 4.9 g/dL (ref 3.5–5.0)
Alkaline Phosphatase: 97 U/L (ref 38–126)
Anion gap: 17 — ABNORMAL HIGH (ref 5–15)
BUN: 8 mg/dL (ref 6–20)
CALCIUM: 10 mg/dL (ref 8.9–10.3)
CO2: 22 mmol/L (ref 22–32)
Chloride: 101 mmol/L (ref 101–111)
Creatinine, Ser: 0.77 mg/dL (ref 0.44–1.00)
GFR calc Af Amer: >60 mL/min (ref >60)
GLUCOSE: 120 mg/dL — AB (ref 65–99)
Potassium: 3.3 mmol/L — ABNORMAL LOW (ref 3.5–5.1)
Sodium: 140 mmol/L (ref 135–145)
Total Bilirubin: 1 mg/dL (ref 0.3–1.2)
Total Protein: 8.5 g/dL — ABNORMAL HIGH (ref 6.5–8.1)

## 2015-02-11 LAB — I-STAT TROPONIN, ED: TROPONIN I, POC: 0.05 ng/mL (ref 0.00–0.08)

## 2015-02-11 LAB — LIPASE, BLOOD: LIPASE: 31 U/L (ref 22–51)

## 2015-02-11 MED ORDER — ONDANSETRON HCL 4 MG/2ML IJ SOLN
4.0000 mg | Freq: Once | INTRAMUSCULAR | Status: AC
  Administered 2015-02-11: 4 mg via INTRAVENOUS
  Filled 2015-02-11: qty 2

## 2015-02-11 MED ORDER — DIAZEPAM 5 MG PO TABS
10.0000 mg | ORAL_TABLET | Freq: Once | ORAL | Status: AC
  Administered 2015-02-11: 10 mg via ORAL
  Filled 2015-02-11: qty 2

## 2015-02-11 NOTE — ED Notes (Addendum)
Pt states she was here this past Thursday for same symptoms and was placed in a room for observation. Pt states vomiting, discomfort to chest and feeling anxious. Pt states she last took her valium a week ago, stating that she weaned herself off of them. Pt states same symptoms as last visit, states that she feels like she is having a panic attack. She has not been able to pick up medications from the pharmacy, that were prescribed to her when she left the hospital on Thursday. Pt is heaving, complaining of chest discomfot and epigastric pain. Pts sister states this is the 3rd episode in one week. Pt stated earlier that she has not taken her valium in a week as well.

## 2015-02-11 NOTE — Discharge Instructions (Signed)
Take all of your medications as instructed.   Panic Attacks Panic attacks are sudden, short-livedsurges of severe anxiety, fear, or discomfort. They may occur for no reason when you are relaxed, when you are anxious, or when you are sleeping. Panic attacks may occur for a number of reasons:   Healthy people occasionally have panic attacks in extreme, life-threatening situations, such as war or natural disasters. Normal anxiety is a protective mechanism of the body that helps [place] react to danger (fight or flight response).  Panic attacks are often seen with anxiety disorders, such as panic disorder, social anxiety disorder, generalized anxiety disorder, and phobias. Anxiety disorders cause excessive or uncontrollable anxiety. They may interfere with your relationships or other life activities.  Panic attacks are sometimes seen with other mental illnesses, such as depression and posttraumatic stress disorder.  Certain medical conditions, prescription medicines, and drugs of abuse can cause panic attacks. SYMPTOMS  Panic attacks start suddenly, peak within 20 minutes, and are accompanied by four or more of the following symptoms:  Pounding heart or fast heart rate (palpitations).  Sweating.  Trembling or shaking.  Shortness of breath or feeling smothered.  Feeling choked.  Chest pain or discomfort.  Nausea or strange feeling in your stomach.  Dizziness, light-headedness, or feeling like you will faint.  Chills or hot flushes.  Numbness or tingling in your lips or hands and feet.  Feeling that things are not real or feeling that you are not yourself.  Fear of losing control or going crazy.  Fear of dying. Some of these symptoms can mimic serious medical conditions. For example, you may think you are having a heart attack. Although panic attacks can be very scary, they are not life threatening. DIAGNOSIS  Panic attacks are diagnosed through an assessment by your health care  provider. Your health care provider will ask questions about your symptoms, such as where and when they occurred. Your health care provider will also ask about your medical history and use of alcohol and drugs, including prescription medicines. Your health care provider may order blood tests or other studies to rule out a serious medical condition. Your health care provider may refer you to a mental health professional for further evaluation. TREATMENT   Most healthy people who have one or two panic attacks in an extreme, life-threatening situation will not require treatment.  The treatment for panic attacks associated with anxiety disorders or other mental illness typically involves counseling with a mental health professional, medicine, or a combination of both. Your health care provider will help determine what treatment is best for you.  Panic attacks due to physical illness usually go away with treatment of the illness. If prescription medicine is causing panic attacks, talk with your health care provider about stopping the medicine, decreasing the dose, or substituting another medicine.  Panic attacks due to alcohol or drug abuse go away with abstinence. Some adults need professional help in order to stop drinking or using drugs. HOME CARE INSTRUCTIONS   Take all medicines as directed by your health care provider.   Schedule and attend follow-up visits as directed by your health care provider. It is important to keep all your appointments. SEEK MEDICAL CARE IF:  You are not able to take your medicines as prescribed.  Your symptoms do not improve or get worse. SEEK IMMEDIATE MEDICAL CARE IF:   You experience panic attack symptoms that are different than your usual symptoms.  You have serious thoughts about hurting yourself or others.  You are taking medicine for panic attacks and have a serious side effect. MAKE SURE YOU:  Understand these instructions.  Will watch your  condition.  Will get help right away if you are not doing well or get worse. Document Released: 07/01/2005 Document Revised: 07/06/2013 Document Reviewed: 02/12/2013 [facility] Patient Information 2015 [place], [place]. This information is not intended to replace advice given to you by your health care provider. Make sure you discuss any questions you have with your health care provider.

## 2015-02-11 NOTE — ED Notes (Signed)
Pt states pain to lower center test with tenderness to epigastric area. Pt is heaving. Medicated pt with Zofran 4mg  IV. Pt is very anxious still at this time. Family at bedside.

## 2015-02-11 NOTE — ED Provider Notes (Signed)
CSN: [id number]     Arrival date & time 02/11/15  1559 History   First MD Initiated Contact with Patient 02/11/15 1711     Chief Complaint  Patient presents with  . Anxiety     (Consider location/radiation/quality/duration/timing/severity/associated sxs/prior Treatment) HPI   [name] is a 49 y.o. female who presents for evaluation of crampy pain all over body, and epigastric discomfort. The symptoms are ongoing, and persistent. She was seen and evaluated, admitted, 2 days ago for same, discharged yesterday. Following hospital discharge, she took her prescription, for Valium to the drugstore, but did not pick it up. She lives with her boyfriend with whom she says she is getting along. She denies stress. There are no other known modifying factors.  Past Medical History  Diagnosis Date  . Asthma   . COPD (chronic obstructive pulmonary disease)   . Hypertension   . GERD (gastroesophageal reflux disease)   . Anxiety disorder   . Chronic pain syndrome   . Anxiety   . Depression   . Fibromyalgia   . Epigastric pain 03/29/2013  . Fatigue 03/29/2013  . Shortness of breath dyspnea    Past Surgical History  Procedure Laterality Date  . Cholecystectomy    . Cesarean section    . Carpal tunnel release    . Esophagogastroduodenoscopy  07/31/2011    Procedure: ESOPHAGOGASTRODUODENOSCOPY (EGD);  Surgeon: Rogene Houston, MD;  [facility];  Service: Endoscopy;  Laterality: N/A;  Venia Minks dilation  07/31/2011    Procedure: Venia Minks DILATION;  Surgeon: Rogene Houston, MD;  Location: [facility];  Service: Endoscopy;;  . Endometrial ablation    . Mass excision  05/20/2012    Procedure: EXCISION MASS;  Surgeon: Sanjuana Kava, MD;  Location: [facility];  Service: Orthopedics;  Laterality: Left;  Repair of Fascial Defect Left Knee  . Carpal tunnel release  07/23/2012    Procedure: CARPAL TUNNEL RELEASE;  Surgeon: Sanjuana Kava, MD;  Location: [facility];  Service: Orthopedics;   Laterality: Left;  . Esophagogastroduodenoscopy (egd) with esophageal dilation N/A 09/30/2012    Procedure: ESOPHAGOGASTRODUODENOSCOPY (EGD) WITH ESOPHAGEAL DILATION;  Surgeon: Rogene Houston, MD;  [facility];  Service: Endoscopy;  Laterality: N/A;  245  . Knee surgery Left   . Esophagogastroduodenoscopy N/A 12/31/2013    Procedure: ESOPHAGOGASTRODUODENOSCOPY (EGD);  Surgeon: Rogene Houston, MD;  [facility];  Service: Endoscopy;  Laterality: N/A;  120-moved to 925 Ann to notify pt  . Balloon dilation N/A 12/31/2013    Procedure: BALLOON DILATION;  Surgeon: Rogene Houston, MD;  [facility];  Service: Endoscopy;  Laterality: N/A;  Venia Minks dilation N/A 12/31/2013    Procedure: Venia Minks DILATION;  Surgeon: Rogene Houston, MD;  [facility];  Service: Endoscopy;  Laterality: N/A;  . Savory dilation N/A 12/31/2013    Procedure: SAVORY DILATION;  Surgeon: Rogene Houston, MD;  [facility];  Service: Endoscopy;  Laterality: N/A;  . Cyst removed Left     had surgery to removed cyst  . Spine surgery     Family History  Problem Relation Age of Onset  . Asthma Mother   . Asthma Father   . Cancer Father    History  Substance Use Topics  . Smoking status: Former Smoker    Types: Cigarettes    Quit date: 10/26/2011  . Smokeless tobacco: Never Used  . Alcohol Use: 1.2 oz/week    2  Cans of beer per week     Comment: social   OB History    Gravida Para Term Preterm AB TAB SAB Ectopic Multiple Living   2 2        1      Review of Systems  All other systems reviewed and are negative.     Allergies  Review of patient's allergies indicates no known allergies.  Home Medications   Prior to Admission medications   Medication Sig Start Date End Date Taking? Authorizing Provider  albuterol (PROVENTIL HFA;VENTOLIN HFA) 108 (90 BASE) MCG/ACT inhaler Inhale 2 puffs into the lungs every 6 (six) hours as needed for wheezing or shortness of  breath. For shortness of breath Patient taking differently: Inhale 2 puffs into the lungs every 6 (six) hours as needed for wheezing or shortness of breath.  07/31/11  Yes Kathie Dike, MD  albuterol (PROVENTIL) (2.5 MG/3ML) 0.083% nebulizer solution Take 2.5 mg by nebulization every 6 (six) hours as needed for wheezing or shortness of breath.    Yes Historical Provider, MD  citalopram (CELEXA) 20 MG tablet Take 20 mg by mouth daily.   Yes Historical Provider, MD  diazepam (VALIUM) 10 MG tablet Take 1 tablet (10 mg total) by mouth every 8 (eight) hours as needed for anxiety (also for muscle spasms). 09/26/14  Yes Melina Schools, MD  diltiazem (CARDIZEM CD) 240 MG 24 hr capsule Take 240 mg by mouth daily.   Yes Historical Provider, MD  lisinopril (PRINIVIL,ZESTRIL) 20 MG tablet Take 1 tablet (20 mg total) by mouth daily. 07/31/11  Yes Kathie Dike, MD  mometasone-formoterol (DULERA) 200-5 MCG/ACT AERO Inhale 2 puffs into the lungs 2 (two) times daily as needed for shortness of breath.    Yes Historical Provider, MD  Omega-3 Fatty Acids (FISH OIL) 1200 MG CAPS Take 1,200 mg by mouth daily.    Yes Historical Provider, MD  omeprazole (PRILOSEC) 20 MG capsule Take 1 capsule (20 mg total) by mouth daily. 02/10/15  Yes Estela Leonie Green, MD  ondansetron (ZOFRAN) 4 MG tablet Take 1 tablet (4 mg total) by mouth every 6 (six) hours as needed for nausea. 02/10/15  Yes Estela Leonie Green, MD  oxyCODONE-acetaminophen (PERCOCET) 10-325 MG per tablet Take 1-2 tablets by mouth every 4 (four) hours as needed for pain.    Yes Historical Provider, MD  phentermine 37.5 MG capsule Take 37.5 mg by mouth daily.   Yes Historical Provider, MD  promethazine (PHENERGAN) 25 MG tablet Take 1 tablet (25 mg total) by mouth every 6 (six) hours as needed for nausea or vomiting. 02/09/15  Yes Evalee Jefferson, PA-C   BP 169/72 mmHg  Pulse 98  Temp(Src) 97.6 F (36.4 C) (Oral)  Resp 18  Ht 5\' 4"  (1.626 m)  Wt 150 lb (68.04  kg)  BMI 25.73 kg/m2  SpO2 100%  LMP 07/16/2007 Physical Exam  Constitutional: She is oriented to person, place, and time. She appears well-developed and well-nourished. She appears distressed (She is uncomfortable, anxious).  HENT:  Head: Normocephalic and atraumatic.  Right Ear: External ear normal.  Left Ear: External ear normal.  Eyes: Conjunctivae and EOM are normal. Pupils are equal, round, and reactive to light.  Neck: Normal range of motion and phonation normal. Neck supple.  Cardiovascular: Normal rate, regular rhythm and normal heart sounds.   Pulmonary/Chest: Effort normal and breath sounds normal. She exhibits no bony tenderness.  Abdominal: Soft. There is tenderness (Epigastric).  Musculoskeletal: Normal range of motion.  Neurological: She is alert and oriented to person, place, and time. No cranial nerve deficit or sensory deficit. She exhibits normal muscle tone. Coordination normal.  Skin: Skin is warm, dry and intact.  Psychiatric:  Anxious  Nursing note and vitals reviewed.   ED Course  Procedures (including critical care time)  We contacted the patient's pharmacy, and she actually picked up her medications. 7/27, and 7/28- these were diltiazem, lisinopril, diazepam, oxycodone, promethazine, and famotidine.  Medications  ondansetron (ZOFRAN) injection 4 mg (4 mg Intravenous Given 02/11/15 1700)  diazepam (VALIUM) tablet 10 mg (10 mg Oral Given 02/11/15 1800)    Patient Vitals for the past 24 hrs:  BP Temp Temp src Pulse Resp SpO2 Height Weight  02/11/15 1915 - - - 79 14 100 % - -  02/11/15 1900 (!) 176/108 mmHg - - 87 18 100 % - -  02/11/15 1845 - - - 73 17 100 % - -  02/11/15 1830 - - - 75 19 100 % - -  02/11/15 1815 - - - 76 17 100 % - -  02/11/15 1800 (!) 167/101 mmHg - - 94 18 100 % - -  02/11/15 1745 - - - 101 18 100 % - -  02/11/15 1741 169/72 mmHg - - 98 18 100 % - -  02/11/15 1730 169/92 mmHg - - 90 16 100 % - -  02/11/15 1715 - - - 93 19 100 % - -   02/11/15 1701 (!) 157/106 mmHg 97.6 F (36.4 C) Oral 92 16 100 % - -  02/11/15 1700 (!) 157/106 mmHg - - 83 16 100 % - -  02/11/15 1645 - - - 93 20 100 % - -  02/11/15 1630 - - - 80 18 100 % - -  02/11/15 1615 - - - 83 18 100 % - -  02/11/15 1600 (!) 159/104 mmHg - - 93 17 100 % - -  02/11/15 1553 (!) 174/102 mmHg 97.8 F (36.6 C) Oral 83 19 100 % 5\' 4"  (1.626 m) 150 lb (68.04 kg)    7:24 PM Reevaluation with update and discussion. After initial assessment and treatment, an updated evaluation reveals patient still complains of nausea and upper abdominal pain. The patient's husband is with her now. He actually picked up prescriptions for Phenergan and Pepcid, which were prescribed yesterday; and the patient did not take them.  Findings were discussed with the patient and her husband, they agree with the treatment plan. All questions were answered . Crispin Vogel L    Labs Review Labs Reviewed  COMPREHENSIVE METABOLIC PANEL - Abnormal; Notable for the following:    Potassium 3.3 (*)    Glucose, Bld 120 (*)    Total Protein 8.5 (*)    Anion gap 17 (*)    All other components within normal limits  CBC WITH DIFFERENTIAL/PLATELET  LIPASE, BLOOD  I-STAT TROPOININ, ED    Imaging Review Dg Abd Acute W/chest  02/09/2015   CLINICAL DATA:  Severe upper abdomen pain and nausea.  EXAM: DG ABDOMEN ACUTE W/ 1V CHEST  COMPARISON:  February 09, 2015 chest x-ray  FINDINGS: There is no evidence of dilated bowel loops or free intraperitoneal air. Prior cholecystectomy clips are noted. Contrast is noted bladder from recent CT. No radiopaque calculi or other significant radiographic abnormality is seen. Heart size and mediastinal contours are within normal limits. Both lungs are clear.  IMPRESSION: No bowel obstruction or free air.  No acute cardiopulmonary disease.   Electronically  Signed   By: Abelardo Diesel M.D.   On: 02/09/2015 21:01     EKG Interpretation None        Date: 02/11/15- MUSE hyperlink is  inactive  Rate: 73  Rhythm: normal sinus rhythm  QRS Axis: normal  PR and QT Intervals: normal  ST/T Wave abnormalities: normal  PR and QRS Conduction Disutrbances:none  Narrative Interpretation:   Old EKG Reviewed: unchanged    MDM   Final diagnoses:  Anxiety    Persistent anxiety, with medication noncompliance. Doubt ACS, PE, pneumonia, or metabolic instability.  Nursing Notes Reviewed/ Care Coordinated Applicable Imaging Reviewed Interpretation of Laboratory Data incorporated into ED treatment  The patient appears reasonably screened and/or stabilized for discharge and I doubt any other medical condition or other [facility] requiring further screening, evaluation, or treatment in the ED at this time prior to discharge.  Plan: Home Medications- usual; Home Treatments-gradually advance diet,  rest; return here if the recommended treatment, does not improve the symptoms; Recommended follow up- PCP asap     Daleen Bo, MD 02/11/15 [phone]

## 2015-02-15 ENCOUNTER — Encounter (INDEPENDENT_AMBULATORY_CARE_PROVIDER_SITE_OTHER): Payer: Self-pay | Admitting: *Deleted

## 2015-03-16 ENCOUNTER — Ambulatory Visit (INDEPENDENT_AMBULATORY_CARE_PROVIDER_SITE_OTHER): Payer: Medicaid Other | Admitting: Internal Medicine

## 2015-04-10 ENCOUNTER — Ambulatory Visit (HOSPITAL_COMMUNITY): Payer: Medicaid Other

## 2015-04-19 ENCOUNTER — Ambulatory Visit (HOSPITAL_COMMUNITY)
Admission: RE | Admit: 2015-04-19 | Discharge: 2015-04-19 | Disposition: A | Discharge status: Home / Self Care | Payer: Medicaid Other | Source: Ambulatory Visit | Attending: Internal Medicine | Admitting: Internal Medicine

## 2015-04-19 DIAGNOSIS — Z1231 Encounter for screening mammogram for malignant neoplasm of breast: Secondary | ICD-10-CM

## 2015-04-27 ENCOUNTER — Other Ambulatory Visit: Payer: Self-pay | Admitting: Orthopedic Surgery

## 2015-04-27 DIAGNOSIS — IMO0002 Reserved for concepts with insufficient information to code with codable children: Secondary | ICD-10-CM

## 2015-05-10 ENCOUNTER — Ambulatory Visit (HOSPITAL_COMMUNITY)
Admission: RE | Admit: 2015-05-10 | Discharge: 2015-05-10 | Disposition: A | Discharge status: Home / Self Care | Payer: Medicaid Other | Source: Ambulatory Visit | Attending: Orthopedic Surgery | Admitting: Orthopedic Surgery

## 2015-05-10 DIAGNOSIS — M8958 Osteolysis, other site: Secondary | ICD-10-CM | POA: Insufficient documentation

## 2015-05-10 DIAGNOSIS — M545 Low back pain: Secondary | ICD-10-CM | POA: Insufficient documentation

## 2015-05-10 DIAGNOSIS — Z981 Arthrodesis status: Secondary | ICD-10-CM | POA: Diagnosis not present

## 2015-05-10 DIAGNOSIS — IMO0002 Reserved for concepts with insufficient information to code with codable children: Secondary | ICD-10-CM

## 2015-08-04 ENCOUNTER — Other Ambulatory Visit (INDEPENDENT_AMBULATORY_CARE_PROVIDER_SITE_OTHER): Payer: Self-pay | Admitting: Internal Medicine

## 2015-09-22 ENCOUNTER — Other Ambulatory Visit (HOSPITAL_COMMUNITY): Payer: Self-pay | Admitting: Internal Medicine

## 2015-09-26 ENCOUNTER — Other Ambulatory Visit (HOSPITAL_COMMUNITY): Payer: Self-pay | Admitting: Internal Medicine

## 2015-10-02 ENCOUNTER — Other Ambulatory Visit (HOSPITAL_COMMUNITY): Payer: Self-pay | Admitting: Internal Medicine

## 2015-11-01 ENCOUNTER — Other Ambulatory Visit (INDEPENDENT_AMBULATORY_CARE_PROVIDER_SITE_OTHER): Payer: Self-pay | Admitting: Internal Medicine

## 2015-11-01 NOTE — Telephone Encounter (Signed)
Patient will need to have an appointment prior to further refills. She was last seen 2015. Or patient may get from PCP.

## 2015-11-13 NOTE — Telephone Encounter (Signed)
I called the patient and advised her for any future refills she would need to schedule an appointment with [place] or see her PCP.

## 2016-02-04 IMAGING — CT CT ANGIO CHEST
2 of 6 series · 6 of 36 positions shown · IV contrast (Omnipaque 300)
Comparison: June 25, 2014

CLINICAL DATA: Shortness of breath with mid sternal chest pain
since this morning.

EXAM:
CT ANGIOGRAPHY CHEST WITH CONTRAST
TECHNIQUE: Multidetector CT imaging of the chest was performed using the
standard protocol during bolus administration of intravenous
contrast. Multiplanar CT image reconstructions and MIPs were
obtained to evaluate the vascular anatomy.
CONTRAST:  100mL OMNIPAQUE IOHEXOL 350 MG/ML SOLN

[Series 4: pe 3.0 b40f · axial · 0.69mm/px · z∈[-259,-97]mm · 5 of 82 slices shown]
[im 14/82  lung]
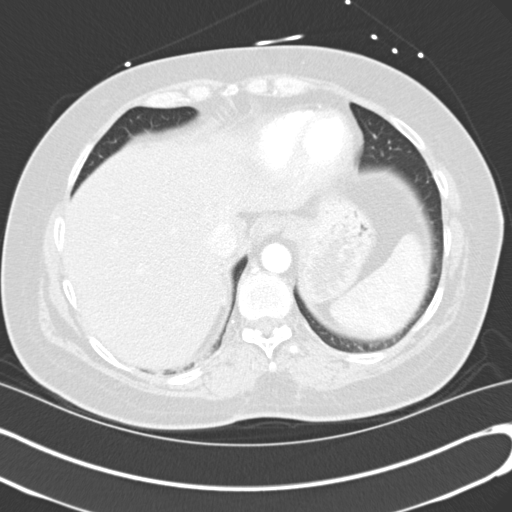
[im 28/82  mediastinal]
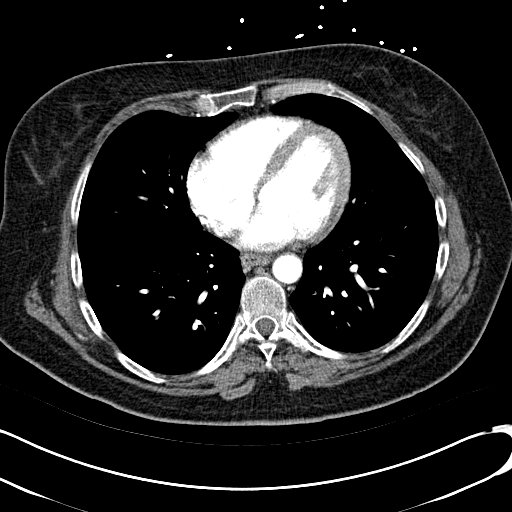
[im 41/82  lung]
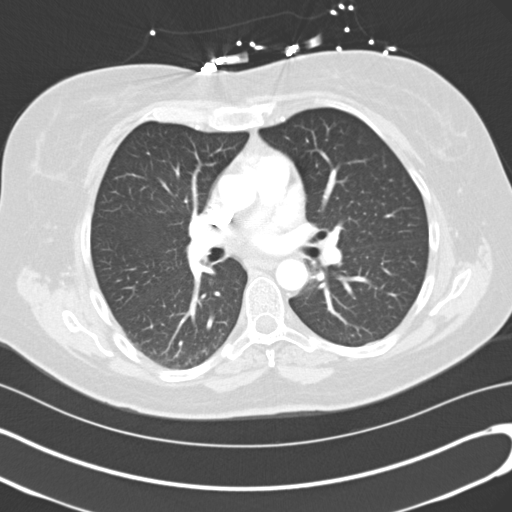
[im 55/82  mediastinal]
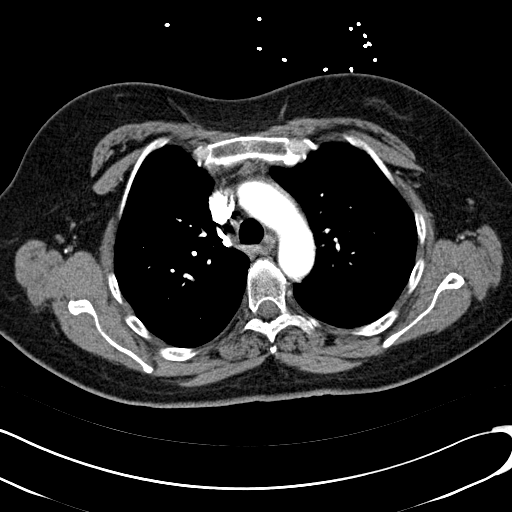
[im 68/82  lung]
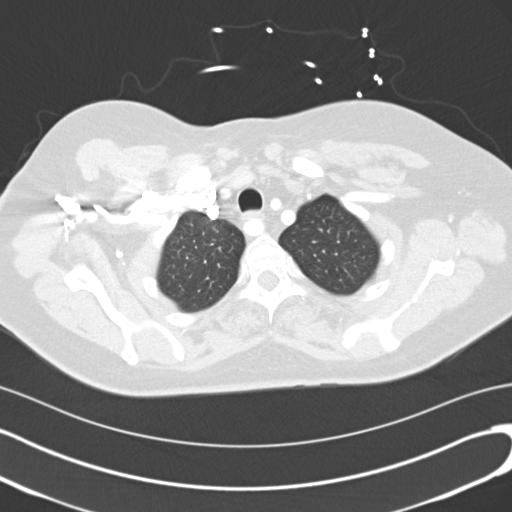

[Series 7: mpr coronal pe 3mm · coronal · 0.48mm/px · 1 of 78 slices shown]
[im 39/78  mediastinal]
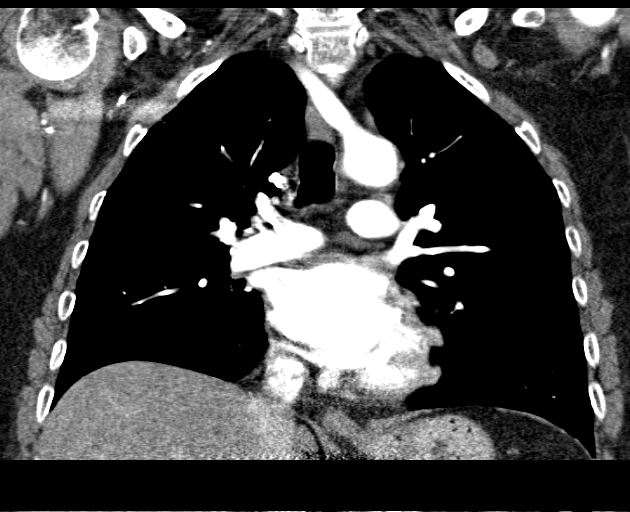

[6 of 36 positions shown; findings below may reference images not displayed]

FINDINGS: There is no pulmonary embolus. The heart size is normal. There is no
pericardial effusion. The aorta is normal. No mediastinal or hilar
lymphadenopathy is identified. There is no pulmonary consolidation
or pleural effusion. No pulmonary mass is identified. Minimal
dependent atelectasis of the posterior right lung base is
identified. The visualized upper abdominal structures are
unremarkable. The patient is status post prior fixation of the lower
thoracic spine.

Review of the MIP images confirms the above findings.
IMPRESSION: No pulmonary embolus.  No acute cardiopulmonary disease identified.

## 2016-02-04 IMAGING — DX DG CHEST 2V
2 series · 2 of 2 positions shown · non-contrast
Comparison: 09/23/2014

CLINICAL DATA: Epigastric pain and vomiting

EXAM:
CHEST - 2 VIEW

[chest pa]
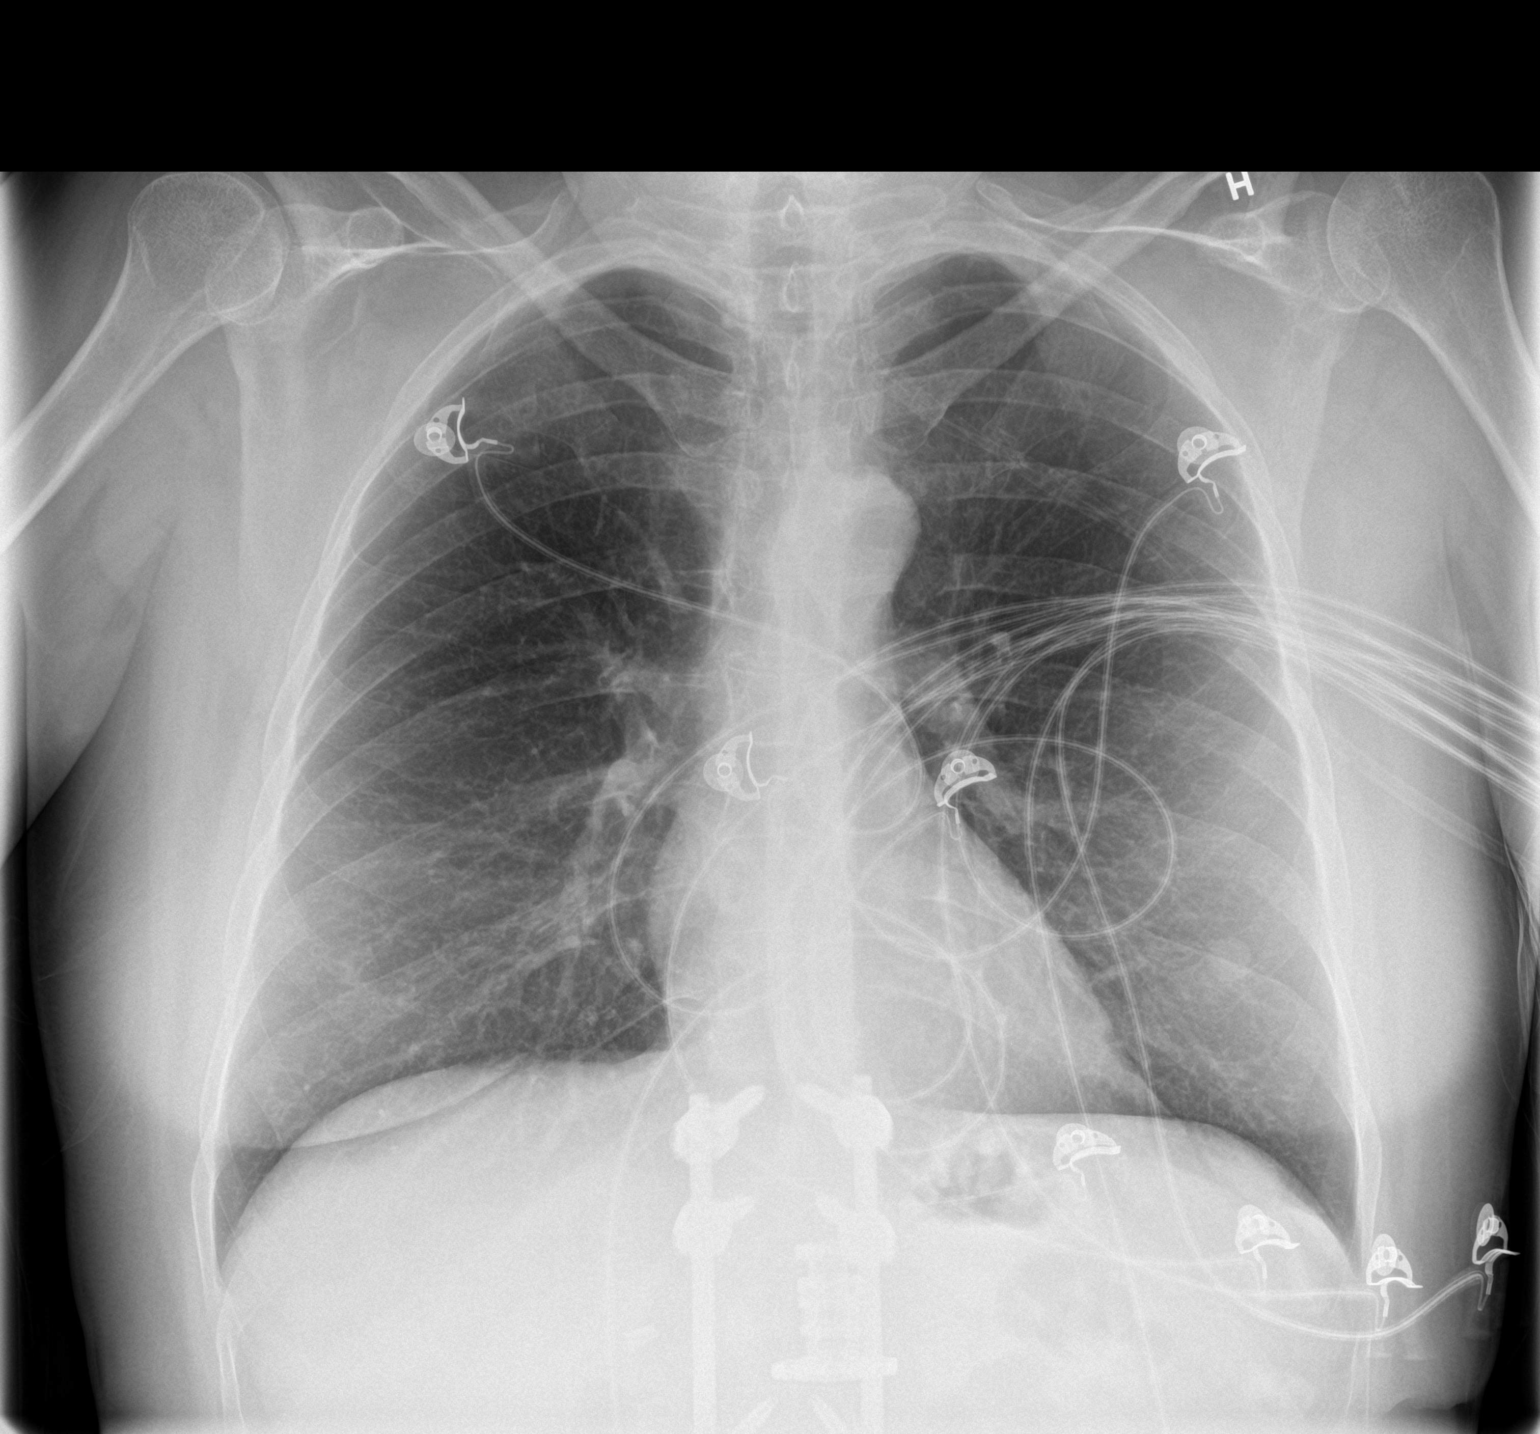

[chest lat]
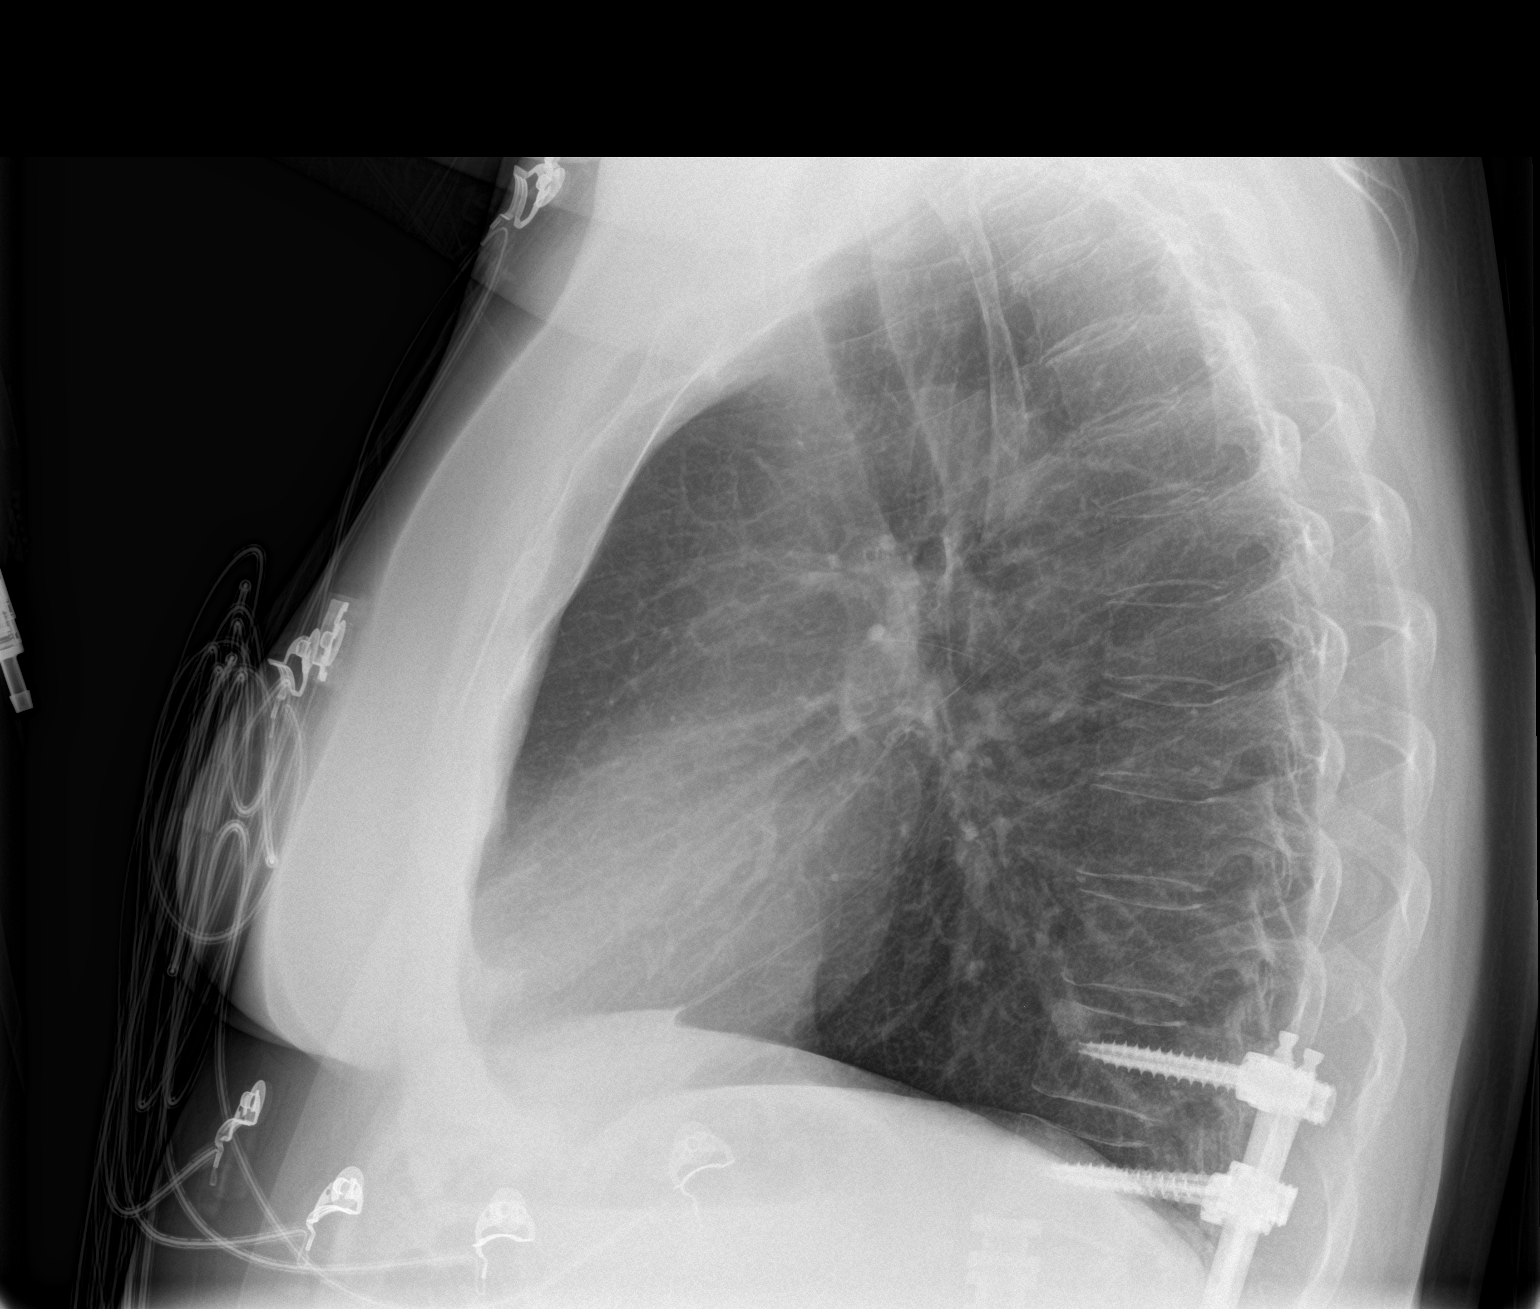

[2 of 2 positions shown; findings below may reference images not displayed]

FINDINGS: Cardiac shadow is within normal limits. The lungs are well aerated
bilaterally. Bilateral nipple shadows are noted. No focal infiltrate
is seen. Postsurgical changes in the thoracolumbar spine are noted.
IMPRESSION: No acute abnormality noted.  Bilateral nipple shadows seen.

## 2016-02-14 ENCOUNTER — Other Ambulatory Visit (INDEPENDENT_AMBULATORY_CARE_PROVIDER_SITE_OTHER): Payer: Self-pay | Admitting: Internal Medicine

## 2016-02-14 NOTE — Telephone Encounter (Signed)
Patient will need to have an appointment prior to further refills. Currently given 6 month of refills. Last seen 03/2015. Patient may also get from her PCP/

## 2016-02-15 ENCOUNTER — Encounter (INDEPENDENT_AMBULATORY_CARE_PROVIDER_SITE_OTHER): Payer: Self-pay | Admitting: Internal Medicine

## 2016-02-15 NOTE — Telephone Encounter (Signed)
Patient has been given an appointment for 02/28/16 at 11:15am with Deberah Castle, NP.  A letter has been mailed.

## 2016-02-28 ENCOUNTER — Ambulatory Visit (INDEPENDENT_AMBULATORY_CARE_PROVIDER_SITE_OTHER): Payer: Medicaid Other | Admitting: Internal Medicine

## 2016-07-23 ENCOUNTER — Institutional Professional Consult (permissible substitution): Payer: Medicaid Other | Admitting: Neurology

## 2016-08-12 ENCOUNTER — Ambulatory Visit (INDEPENDENT_AMBULATORY_CARE_PROVIDER_SITE_OTHER): Payer: Medicaid Other | Admitting: Neurology

## 2016-08-12 ENCOUNTER — Encounter: Payer: Self-pay | Admitting: Neurology

## 2016-08-12 VITALS — BP 110/62 | HR 78 | Resp 16 | Ht 64.0 in | Wt 170.0 lb

## 2016-08-12 DIAGNOSIS — R0683 Snoring: Secondary | ICD-10-CM

## 2016-08-12 DIAGNOSIS — R609 Edema, unspecified: Secondary | ICD-10-CM | POA: Diagnosis not present

## 2016-08-12 DIAGNOSIS — R0681 Apnea, not elsewhere classified: Secondary | ICD-10-CM | POA: Diagnosis not present

## 2016-08-12 DIAGNOSIS — R351 Nocturia: Secondary | ICD-10-CM

## 2016-08-12 DIAGNOSIS — E663 Overweight: Secondary | ICD-10-CM | POA: Diagnosis not present

## 2016-08-12 DIAGNOSIS — R400 Somnolence: Secondary | ICD-10-CM | POA: Diagnosis not present

## 2016-08-12 NOTE — Progress Notes (Signed)
Subjective:    Patient ID: [name] is a 51 y.o. female.  HPI     Star Age, MD, PhD [facility] [address] [place], [place] [address]  Dear Dr. Gerarda Fraction,  I saw your patient, [name], upon your kind request in my neurologic clinic today for initial consultation of her sleep disorder, in particular concern for underlying obstructive sleep apnea. The patient is unaccompanied today. As you know, [name] is a 51 year old right-handed woman with an underlying medical history of asthma, COPD, hypertension, reflux disease, chronic pain syndrome on chronic narcotic pain medication, anxiety, depression, fibromyalgia, and overweight state, who reports snoring and excessive daytime somnolence. Snoring is reportedly loud at times and she has witnessed apneic pauses while asleep. I reviewed your office note from 06/12/2016, which you kindly included. She is on prn Valium. Her Epworth sleepiness score is 17 out of 24, her fatigue score is 29 out of 63. She denies falling asleep at the wheel. She does not like to nap, but may doze off easily when sedentary. She stopped the Celexa and remeron on her own. She is afraid of SEs.  She had back surgery on 09/22/14 and I reviewed the L spine Xray from 09/22/14: IMPRESSION: Intraoperative fluoroscopy utilized for surgical ventral purposes, demonstrating posterior fixation from T11 through L3 with spacer at the anteriorly compressed L1.  She had CT L spine on 08/14/14 and I reviewed the results: IMPRESSION: Further collapse of the chronic L1 compression fracture, now with 50% loss of vertebral body height. This can be seen in normal healing with resorbtion around the fracture planes but can also be associated with posttraumatic osteonecrosis of the vertebral body (Kummel disease).  Her bedtime is between 9 and 10 PM, wakeup time is 6:10 a.m. She lives with her boyfriend and 42-year-old daughter.  They have 1 dog in the household, not in the bedroom typically. She does not watch TV in the bedroom. She has difficulty maintaining sleep and often wakes up around 3 AM and has difficulty going back to sleep. She has significant nocturia, 3-4 times per average night. She denies morning headaches. She has gained weight. Her mother has sleep apnea and uses a CPAP machine. The patient quit smoking 5 years ago, she drinks alcohol a few times a week, typically no caffeine on a daily basis. She has some restlessness in her arms at times. She has some residual weakness in her left leg. 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  Her Past Medical History Is Significant For: Past Medical History:  Diagnosis Date  . Anxiety   . Anxiety disorder   . Asthma   . Chronic pain syndrome   . COPD (chronic obstructive pulmonary disease) ([place])   . Depression   . Epigastric pain 03/29/2013  . Fatigue 03/29/2013  . Fibromyalgia   . GERD (gastroesophageal reflux disease)   . Hypertension   . Shortness of breath dyspnea     Her Past Surgical History Is Significant For: Past Surgical History:  Procedure Laterality Date  . BALLOON DILATION N/A 12/31/2013   Procedure: BALLOON DILATION;  Surgeon: Rogene Houston, MD;  [facility];  Service: Endoscopy;  Laterality: N/A;  . CARPAL TUNNEL RELEASE    . CARPAL TUNNEL RELEASE  07/23/2012   Procedure: CARPAL TUNNEL RELEASE;  Surgeon: Sanjuana Kava, MD;  Location: [facility];  Service: Orthopedics;  Laterality: Left;  . CESAREAN SECTION    .  CHOLECYSTECTOMY    . cyst removed Left    had surgery to removed cyst  . ENDOMETRIAL ABLATION    . ESOPHAGOGASTRODUODENOSCOPY  07/31/2011   Procedure: ESOPHAGOGASTRODUODENOSCOPY (EGD);  Surgeon: Rogene Houston, MD;  [facility];  Service: Endoscopy;  Laterality: N/A;  . ESOPHAGOGASTRODUODENOSCOPY N/A 12/31/2013   Procedure: ESOPHAGOGASTRODUODENOSCOPY (EGD);  Surgeon: Rogene Houston, MD;   [facility];  Service: Endoscopy;  Laterality: N/A;  120-moved to 925 Ann to notify pt  . ESOPHAGOGASTRODUODENOSCOPY (EGD) WITH ESOPHAGEAL DILATION N/A 09/30/2012   Procedure: ESOPHAGOGASTRODUODENOSCOPY (EGD) WITH ESOPHAGEAL DILATION;  Surgeon: Rogene Houston, MD;  [facility];  Service: Endoscopy;  Laterality: N/A;  245  . KNEE SURGERY Left   . MALONEY DILATION  07/31/2011   Procedure: MALONEY DILATION;  Surgeon: Rogene Houston, MD;  [facility];  Service: Endoscopy;;  . Venia Minks DILATION N/A 12/31/2013   Procedure: Venia Minks DILATION;  Surgeon: Rogene Houston, MD;  [facility];  Service: Endoscopy;  Laterality: N/A;  . MASS EXCISION  05/20/2012   Procedure: EXCISION MASS;  Surgeon: Sanjuana Kava, MD;  Location: [facility];  Service: Orthopedics;  Laterality: Left;  Repair of Fascial Defect Left Knee  . SAVORY DILATION N/A 12/31/2013   Procedure: SAVORY DILATION;  Surgeon: Rogene Houston, MD;  [facility];  Service: Endoscopy;  Laterality: N/A;  . SPINE SURGERY      Her Family History Is Significant For: Family History  Problem Relation Age of Onset  . Asthma Mother   . Asthma Father   . Cancer Father     Her Social History Is Significant For: Social History   Social History  . Marital status: Single    Spouse name: N/A  . Number of children: N/A  . Years of education: N/A   Occupational History  . N/A    Social History Main Topics  . Smoking status: Former Smoker    Types: Cigarettes    Quit date: 10/26/2011  . Smokeless tobacco: Never Used  . Alcohol use 1.2 oz/week    2 Cans of beer per week     Comment: social  . Drug use: No  . Sexual activity: Yes    Birth control/ protection: None, Surgical   Other Topics Concern  . None   Social History Narrative   Denies any caffeine use     Her Allergies Are:  No Known Allergies:   Her Current Medications Are:  Outpatient Encounter Prescriptions as of 08/12/2016   Medication Sig  . albuterol (PROVENTIL HFA;VENTOLIN HFA) 108 (90 BASE) MCG/ACT inhaler Inhale 2 puffs into the lungs every 6 (six) hours as needed for wheezing or shortness of breath. For shortness of breath (Patient taking differently: Inhale 2 puffs into the lungs every 6 (six) hours as needed for wheezing or shortness of breath. )  . albuterol (PROVENTIL) (2.5 MG/3ML) 0.083% nebulizer solution Take 2.5 mg by nebulization every 6 (six) hours as needed for wheezing or shortness of breath.   . citalopram (CELEXA) 20 MG tablet Take 20 mg by mouth daily.  . diazepam (VALIUM) 10 MG tablet Take 1 tablet (10 mg total) by mouth every 8 (eight) hours as needed for anxiety (also for muscle spasms).  Marland Kitchen diltiazem (CARDIZEM CD) 240 MG 24 hr capsule Take 240 mg by mouth daily.  Marland Kitchen lisinopril (PRINIVIL,ZESTRIL) 20 MG tablet Take 1 tablet (20 mg total) by mouth daily.  . mometasone-formoterol (DULERA) 200-5 MCG/ACT  AERO Inhale 2 puffs into the lungs 2 (two) times daily as needed for shortness of breath.   . Omega-3 Fatty Acids (FISH OIL) 1200 MG CAPS Take 1,200 mg by mouth daily.   Marland Kitchen oxyCODONE-acetaminophen (PERCOCET) 10-325 MG per tablet Take 1-2 tablets by mouth every 4 (four) hours as needed for pain.   . pantoprazole (PROTONIX) 40 MG tablet TAKE 1 TABLET BY MOUTH TWICE DAILY BEFORE A MEAL.  Marland Kitchen phentermine 37.5 MG capsule Take 37.5 mg by mouth daily.  . [DISCONTINUED] omeprazole (PRILOSEC) 20 MG capsule Take 1 capsule (20 mg total) by mouth daily.  . [DISCONTINUED] ondansetron (ZOFRAN) 4 MG tablet Take 1 tablet (4 mg total) by mouth every 6 (six) hours as needed for nausea.  . [DISCONTINUED] promethazine (PHENERGAN) 25 MG tablet Take 1 tablet (25 mg total) by mouth every 6 (six) hours as needed for nausea or vomiting.   No facility-administered encounter medications on file as of 08/12/2016.   :  Review of Systems:  Out of a complete 14 point review of systems, all are reviewed and negative with the  exception of these symptoms as listed below: Review of Systems  Neurological:       Patient has trouble staying asleep, snores, witnessed apnea, wakes up feeling tired, daytime fatigue, sometimes takes naps.    Epworth Sleepiness Scale 0= would never doze 1= slight chance of dozing 2= moderate chance of dozing 3= high chance of dozing  Sitting and reading:3 Watching TV:3 Sitting inactive in a public place (ex. Theater or meeting):1 As a passenger in a car for an hour without a break:3 Lying down to rest in the afternoon:3 Sitting and talking to someone:1 Sitting quietly after lunch (no alcohol):3 In a car, while stopped in traffic:0 Total: 17  Objective:  Neurologic Exam  Physical Exam Physical Examination:   Vitals:   08/12/16 0850  BP: 110/62  Pulse: 78  Resp: 16   General Examination: The patient is a very pleasant 51 y.o. female in no acute distress. She appears well-developed and well-nourished and well groomed.   HEENT: Normocephalic, atraumatic, pupils are equal, round and reactive to light and accommodation. Funduscopic exam is normal with sharp disc margins noted. Extraocular tracking is good without limitation to gaze excursion or nystagmus noted. Normal smooth pursuit is noted. Hearing is grossly intact. Tympanic membranes are clear bilaterally. Face is symmetric with normal facial animation and normal facial sensation. Speech is clear with no dysarthria noted. There is no hypophonia. There is no lip, neck/head, jaw or voice tremor. Neck is supple with full range of passive and active motion. There are no carotid bruits on auscultation. Oropharynx exam reveals: mild mouth dryness, adequate dental hygiene and moderate airway crowding, due to smaller airway entry, tonsils in place, uvula thicker. Mallampati is class II. Tongue protrudes centrally and palate elevates symmetrically. Tonsils are 1+ bilaterally. Neck circumference is 15-1/2 inches. She has a Mild overbite.     Chest: Clear to auscultation without wheezing, rhonchi or crackles noted.  Heart: S1+S2+0, regular and normal without murmurs, rubs or gallops noted.   Abdomen: Soft, non-tender and non-distended with normal bowel sounds appreciated on auscultation.  Extremities: There is trace pitting edema in the distal lower extremities bilaterally. Pedal pulses are intact.  Skin: Warm and dry without trophic changes noted. There are no varicose veins.  Musculoskeletal: exam reveals no obvious joint deformities, tenderness or joint swelling or erythema.   Neurologically:  Mental status: The patient is awake, alert and oriented  in all 4 spheres. Her immediate and remote memory, attention, language skills and fund of knowledge are appropriate. There is no evidence of aphasia, agnosia, apraxia or anomia. Speech is clear with normal prosody and enunciation. Thought process is linear. Mood is normal and affect is normal.  Cranial nerves II - XII are as described above under HEENT exam. In addition: shoulder shrug is normal with equal shoulder height noted. Motor exam: Normal bulk, strength and tone is noted. There is no drift, tremor or rebound. Romberg is negative. Reflexes are 2+ throughout. Babinski: Toes are flexor bilaterally. Fine motor skills and coordination: intact with normal finger taps, normal hand movements, normal rapid alternating patting, normal foot taps and normal foot agility.  Cerebellar testing: No dysmetria or intention tremor on finger to nose testing. Heel to shin is unremarkable bilaterally. There is no truncal or gait ataxia.  Sensory exam: intact to light touch, pinprick, vibration, temperature sense in the upper and lower extremities.  Gait, station and balance: She stands easily. No veering to one side is noted. No leaning to one side is noted. Posture is age-appropriate and stance is narrow based. Gait shows normal stride length and normal pace. No problems turning are noted. Tandem  walk is initially difficult for her.               Assessment and Plan:  In summary, [name] is a very pleasant 51 y.o.-year old female with an underlying medical history of asthma, COPD, hypertension, reflux disease, chronic pain syndrome on chronic narcotic pain medication, anxiety, depression, fibromyalgia, and overweight state, whose history and physical exam are concerning for obstructive sleep apnea (OSA). She reports significant daytime somnolence, nocturia, sleep disruption, and apneic pauses while asleep as well as weight gain. I had a long chat with the patient about my findings and the diagnosis of OSA, its prognosis and treatment options. We talked about medical treatments, surgical interventions and non-pharmacological approaches. I explained in particular the risks and ramifications of untreated moderate to severe OSA, especially with respect to developing cardiovascular disease down the Road, including congestive heart failure, difficult to treat hypertension, cardiac arrhythmias, or stroke. Even type 2 diabetes has, in part, been linked to untreated OSA. Symptoms of untreated OSA include daytime sleepiness, memory problems, mood irritability and mood disorder such as depression and anxiety, lack of energy, as well as recurrent headaches, especially morning headaches. We talked about trying to maintain a healthy lifestyle in general, as well as the importance of weight control. I encouraged the patient to eat healthy, exercise daily and keep well hydrated, to keep a scheduled bedtime and wake time routine, to not skip any meals and eat healthy snacks in between meals. I advised the patient not to drive when feeling sleepy.  I recommended the following at this time: sleep study with potential positive airway pressure titration. (We will score hypopneas at 4% and split the sleep study into diagnostic and treatment portion, if the estimated. 2 hour AHI is >15/h).   She is advised that  having COPD and being on narcotic pain medication may put her at risk for worse desaturations during sleep if she also has obstructive sleep apnea.  I explained the sleep test procedure to the patient and also outlined possible surgical and non-surgical treatment options of OSA, including the use of a custom-made dental device (which would require a referral to a specialist dentist or oral surgeon), upper airway surgical options, such as pillar implants, radiofrequency surgery, tongue base  surgery, and UPPP (which would involve a referral to an ENT surgeon). Rarely, jaw surgery such as mandibular advancement may be considered.  I also explained the CPAP treatment option to the patient, who indicated that she would be willing to try CPAP if the need arises. I explained the importance of being compliant with PAP treatment, not only for insurance purposes but primarily to improve Her symptoms, and for the patient's long term health benefit, including to reduce Her cardiovascular risks. I answered all her questions today and the patient was in agreement. I would like to see her back after the sleep study is completed and encouraged her to call with any interim questions, concerns, problems or updates.   Thank you very much for allowing me to participate in the care of this nice patient. If I can be of any further assistance to you please do not hesitate to call me at [phone].  Sincerely,   Star Age, MD, PhD

## 2016-08-12 NOTE — Patient Instructions (Signed)

## 2016-09-04 ENCOUNTER — Ambulatory Visit (INDEPENDENT_AMBULATORY_CARE_PROVIDER_SITE_OTHER): Payer: Medicaid Other | Admitting: Neurology

## 2016-09-04 DIAGNOSIS — G4730 Sleep apnea, unspecified: Secondary | ICD-10-CM

## 2016-09-04 DIAGNOSIS — G4733 Obstructive sleep apnea (adult) (pediatric): Secondary | ICD-10-CM

## 2016-09-04 DIAGNOSIS — G4734 Idiopathic sleep related nonobstructive alveolar hypoventilation: Secondary | ICD-10-CM

## 2016-09-06 NOTE — Procedures (Signed)
PATIENT'S NAME:  [name], [name] DOB:      [birth year]      MR#:    [id number]     DATE OF RECORDING: 09/04/2016 REFERRING M.D.:  Margaretha Glassing, MD Study Performed:   Baseline Polysomnogram HISTORY: 51 year old woman with a history of asthma, COPD, hypertension, reflux disease, chronic pain syndrome on chronic narcotic pain medication, anxiety, depression, fibromyalgia, and overweight state, who reports snoring and excessive daytime somnolence. The patient endorsed the Epworth Sleepiness Scale at 17 points. The patient's weight 170 pounds with a height of 64 (inches), resulting in a BMI of 29. Kg/m2. The patient's neck circumference measured 15.5 inches.  CURRENT MEDICATIONS: Albuterol inhaler; Albuterol ned; Celexa; Valium; Cardizem CD; Lisinopril; Dulera; Fish oil; Percocet; Protonix; Phentermine   PROCEDURE:  This is a multichannel digital polysomnogram utilizing the Somnostar 11.2 system.  Electrodes and sensors were applied and monitored per AASM Specifications.   EEG, EOG, Chin and Limb EMG, were sampled at 200 Hz.  ECG, Snore and Nasal Pressure, Thermal Airflow, Respiratory Effort, CPAP Flow and Pressure, Oximetry was sampled at 50 Hz. Digital video and audio were recorded.      BASELINE STUDY  Lights Out was at 22:13 and Lights On at 05:05.  Total recording time (TRT) was 412 minutes, with a total sleep time (TST) of  396 minutes.   The patient's sleep latency was 6.5 minutes.  REM latency was 108.5 minutes, which is normal.  The sleep efficiency was 96.1 %.     SLEEP ARCHITECTURE: WASO (Wake after sleep onset) was 8 minutes.  There were 5.5 minutes in Stage N1, 236.5 minutes Stage N2, 68 minutes Stage N3 and 86 minutes in Stage REM.  The percentage of Stage N1 was 1.4%, Stage N2 was 59.7%, which is mildly increased, Stage N3 was 17.2%, which is normal, and Stage R (REM sleep) was 21.7%, which is normal.   The arousals were noted as: 88 were spontaneous, 0 were associated with PLMs, 48  were associated with respiratory events.    Audio and video analysis did not show any abnormal or unusual movements, behaviors, phonations or vocalizations; she slept exclusively in the right lateral position.  The patient took no bathroom breaks. Mild to moderate snoring was noted.  EKG was in keeping with normal sinus rhythm (NSR).  RESPIRATORY ANALYSIS:  There were a total of 84 respiratory events:  38 obstructive apneas, 0 central apneas and 0 mixed apneas with a total of 38 apneas and an apnea index (AI) of 5.8 /hour. There were 46 hypopneas with a hypopnea index of 7. /hour. The patient also had 0 respiratory event related arousals (RERAs).      The total APNEA/HYPOPNEA INDEX (AHI) was 12.7/hour and the total RESPIRATORY DISTURBANCE INDEX was 12.7 /hour.  28 events occurred in REM sleep and 46 events in NREM. The REM AHI was 19.5 /hour, versus a non-REM AHI of 10.8. The patient spent 0 minutes of total sleep time in the supine position and 396 minutes in non-supine.. The supine AHI was 0.0 versus a non-supine AHI of 12.8.  OXYGEN SATURATION & C02:  The Wake baseline 02 saturation was 96%, with the lowest being 81%. Time spent below 89% saturation equaled 116 minutes.  PERIODIC LIMB MOVEMENTS:   The patient had a total of 0 Periodic Limb Movements.  The Periodic Limb Movement (PLM) index was 0 and the PLM Arousal index was 0/hour.    Post-study, the patient indicated that sleep was better than usual.  IMPRESSION:  1.  Obstructive Sleep Apnea (OSA) 2.  Nocturnal hypoxemia  RECOMMENDATIONS:  1. This overnight polysomnogram demonstrates mild to moderate obstructive sleep with significant desaturations into the 80s with evidence of nocturnal hypoxemia. Please note, the absence of supine sleep and therefore supine REM sleep likely underestimates the REM sleep AHI and O2 nadir. Due to the medical history and use of narcotic pain medication, treatment of her OSA with positive airway pressure (in  the form of CPAP) is recommended and is ideally is achieved during a full night CPAP titration study for proper treatment settings and mask fitting. Other treatment options may include avoidance of the supine sleep position, weight loss, an oral appliance (aka dental device, custom made by a specialized dentist usually), or upper airway or jaw surgery (not usually first line treatments). 2. Please note that untreated obstructive sleep apnea carries additional perioperative morbidity. Patients with significant obstructive sleep apnea should receive perioperative PAP therapy and the surgeons and particularly the anesthesiologist should be informed of the diagnosis and the severity of the sleep disordered breathing. 3. The patient should be cautioned not to drive, work at heights, or operate dangerous or heavy equipment when tired or sleepy. Review and reiteration of good sleep hygiene measures should be pursued with any patient. 4. The patient will be seen in follow-up by Dr. Rexene Alberts at [facility] for discussion of the test results and further management strategies. The referring provider will be notified of the test results.  I certify that I have reviewed the entire raw data recording prior to the issuance of this report in accordance with the Standards of Accreditation of the American Academy of Sleep Medicine (AASM)     Star Age, MD, PhD Diplomat, American Board of Psychiatry and Neurology  (Neurology and Sleep Medicine)

## 2016-09-06 NOTE — Progress Notes (Signed)
Patient referred by Dr. Gerarda Fraction, seen by me on 08/12/16, diagnostic PSG on 09/04/16.    Please call and notify the patient that the recent sleep study did confirm the diagnosis of mild to mod obstructive sleep apnea with repeated desaturations into the 80s and O2 nadir of 81%, and that I recommend treatment for this in the form of CPAP. This will require a repeat sleep study for proper titration and mask fitting. Please explain to patient and arrange for a CPAP titration study. I have placed an order in the chart. Thanks, and please route to [facility] for scheduling next sleep study.  Star Age, MD, PhD [facility])

## 2016-09-06 NOTE — Addendum Note (Signed)
Addended by: Star Age on: 09/06/2016 02:39 PM   Modules accepted: Orders

## 2016-09-09 ENCOUNTER — Telehealth: Payer: Self-pay

## 2016-09-09 NOTE — Telephone Encounter (Signed)
-----   Message from Star Age, MD sent at 09/06/2016  2:39 PM EST ----- Patient referred by Dr. Gerarda Fraction, seen by me on 08/12/16, diagnostic PSG on 09/04/16.    Please call and notify the patient that the recent sleep study did confirm the diagnosis of mild to mod obstructive sleep apnea with repeated desaturations into the 80s and O2 nadir of 81%, and that I recommend treatment for this in the form of CPAP. This will require a repeat sleep study for proper titration and mask fitting. Please explain to patient and arrange for a CPAP titration study. I have placed an order in the chart. Thanks, and please route to [facility] for scheduling next sleep study.  Star Age, MD, PhD [facility])

## 2016-09-09 NOTE — Telephone Encounter (Signed)
I called pt. I advised her of her sleep study results. Pt is agreeable to a cpap titration study. I advised her that our sleep lab will call her to schedule it when they hear a determination from [employer]. Pt verbalized understanding of results. Pt had no questions at this time but was encouraged to call back if questions arise.

## 2016-09-17 ENCOUNTER — Ambulatory Visit (INDEPENDENT_AMBULATORY_CARE_PROVIDER_SITE_OTHER): Payer: Medicaid Other | Admitting: Neurology

## 2016-09-17 DIAGNOSIS — G4720 Circadian rhythm sleep disorder, unspecified type: Secondary | ICD-10-CM

## 2016-09-17 DIAGNOSIS — G4733 Obstructive sleep apnea (adult) (pediatric): Secondary | ICD-10-CM | POA: Diagnosis not present

## 2016-09-17 DIAGNOSIS — Z789 Other specified health status: Secondary | ICD-10-CM

## 2016-09-17 DIAGNOSIS — G4734 Idiopathic sleep related nonobstructive alveolar hypoventilation: Secondary | ICD-10-CM

## 2016-09-17 DIAGNOSIS — Z9989 Dependence on other enabling machines and devices: Secondary | ICD-10-CM

## 2016-09-17 DIAGNOSIS — G4731 Primary central sleep apnea: Secondary | ICD-10-CM

## 2016-09-19 ENCOUNTER — Telehealth: Payer: Self-pay

## 2016-09-19 NOTE — Progress Notes (Signed)
Patient referred by Dr. Gerarda Fraction, seen by me on 08/12/16, diagnostic PSG on 09/04/16, PAP titration on 09/17/16.  Please call and inform patient that I have entered an order for treatment with positive airway pressure (PAP) treatment of obstructive sleep apnea (OSA). She did not do well enough on CPAP or BiPAP, but I suggest we start her on empiric BiPAP at 17/13 cm and would recommend, she return for full night BiPAP titration (may need more sophisticated treatment than that) to optimize therapy. We will, therefore, arrange for a machine for home use through a DME (durable medical equipment) company of Her choice; and I will see the patient back in follow-up in about 8 weeks. I will also order a BiPAP titration.  Plus, please ask patient to see her lung doctor or ask PCP for a referral for a lung specialist, if need be, we can place a referral. She has lower oxygen saturations throughout the night, and as I recall, a Hx of COPD. Please also explain to the patient that I will be looking out for compliance data, which can be downloaded from the machine (stored on an SD card, that is inserted in the machine) or via remote access through a modem, that is built into the machine. At the time of the followup appointment we will discuss sleep study results and how it is going with PAP treatment at home. Please advise patient to bring Her machine at the time of the first FU visit, even though this is cumbersome. Bringing the machine for every visit after that will likely not be needed, but often helps for the first visit to troubleshoot if needed. Please re-enforce the importance of compliance with treatment and the need for [place] to monitor compliance data - often an insurance requirement and actually good feedback for the patient as far as how they are doing.  Also remind patient, that any interim PAP machine or mask issues should be first addressed with the DME company, as they can often help better with technical and mask fit  issues. Please ask if patient has a preference regarding DME company.  Please also make sure, the patient has a follow-up appointment with me in about 8-10 weeks from the setup date, thanks.  Once you have spoken to the patient - and faxed/routed report to PCP and referring MD (if other than PCP), you can close this encounter, thanks,   Star Age, MD, PhD [facility] ([place])

## 2016-09-19 NOTE — Addendum Note (Signed)
Addended by: Star Age on: 09/19/2016 08:40 AM   Modules accepted: Orders

## 2016-09-19 NOTE — Telephone Encounter (Signed)
Mask used for titration study was Res Med P10

## 2016-09-19 NOTE — Procedures (Signed)
PATIENT'S NAME:  [name], [name] DOB:      1966/05/10      MR#:    [id number]     DATE OF RECORDING: 09/17/2016 REFERRING M.D.:  Margaretha Glassing, MD Study Performed:   CPAP  Titration HISTORY: 51 year old woman with a history of asthma, COPD, hypertension, reflux disease, chronic pain syndrome on chronic narcotic pain medication, anxiety, depression, fibromyalgia, and overweight state, who presents for full night PAP titration to treat her OSA and nocturnal hypoxemia. Epworth Sleepiness Scale at 17 points. The patient's weight 170 pounds with a height of 64 in, resulting in a BMI of 29. Kg/m2. The patient's neck circumference measured 15.5 inches. Pt had a baseline sleep study on 09/04/16 with an AHI of 12.7 and desaturation nadir of 81%.   CURRENT MEDICATIONS: Albuterol inhaler; Albuterol ned; Celexa; Valium; Cardizem CD; Lisinopril; Dulera; Fish oil; Percocet; Protonix; Phentermine   PROCEDURE:  This is a multichannel digital polysomnogram utilizing the SomnoStar 11.2 system.  Electrodes and sensors were applied and monitored per AASM Specifications.   EEG, EOG, Chin and Limb EMG, were sampled at 200 Hz.  ECG, Snore and Nasal Pressure, Thermal Airflow, Respiratory Effort, CPAP Flow and Pressure, Oximetry was sampled at 50 Hz. Digital video and audio were recorded.      The patient was started on CPAP at 4 cm and advanced to 8 cm, but she had difficulty tolerating CPAP and was switched to BiPAP of 7/4 and titrated to 16/12, but patient had ongoing obstructive and central events, final AHI of 27.1/hour, O2 nadir of 87%.     Lights Out was at 22:18 and Lights On at 05:31. Total recording time (TRT) was 433 minutes, with a total sleep time (TST) of 410 minutes. The patient's sleep latency was 4 minutes. REM latency was 59.5 minutes.  The sleep efficiency was 94.7 %.    SLEEP ARCHITECTURE: WASO (Wake after sleep onset) was 18.5 minutes.  There were 3.5 minutes in Stage N1, 280 minutes Stage N2, 62.5  minutes Stage N3 and 64 minutes in Stage REM.  The percentage of Stage N1 was .9%, Stage N2 was 68.3%, which is increased, Stage N3 was 15.2%, which is normal,  and Stage R (REM sleep) was 15.6%, which is mildly reduced.   The arousals were noted as: 60 were spontaneous, 0 were associated with PLMs, 86 were associated with respiratory events. Audio and video analysis did not show any abnormal or unusual movements, behaviors, phonations or vocalizations.   The patient took 1 bathroom break. The EKG was in keeping with normal sinus rhythm (NSR).  RESPIRATORY ANALYSIS: There was a total of 213 respiratory events: 122 obstructive apneas, 75 central apneas and 0 mixed apneas with a total of 197 apneas and an apnea index (AI) of 28.8 /hour. There were 16 hypopneas with a hypopnea index of 2.3/hour. The patient also had 0 respiratory event related arousals (RERAs).      The total APNEA/HYPOPNEA INDEX  (AHI) was 31.2 /hour and the total RESPIRATORY DISTURBANCE INDEX was 31.2 .hour  2 events occurred in REM sleep and 211 events in NREM. The REM AHI was 1.9 /hour versus a non-REM AHI of 36.6 /hour.  The patient spent 0 minutes of total sleep time in the supine position and 410 minutes in non-supine. The supine AHI was 0.0, versus a non-supine AHI of 31.1.  OXYGEN SATURATION & C02:  The baseline 02 saturation was 90%, with the lowest being 83%. Time spent below 89% saturation equaled 238  minutes.  PERIODIC LIMB MOVEMENTS:  The patient had a total of 0 Periodic Limb Movements. The Periodic Limb Movement (PLM) index was 0 and the PLM Arousal index was 0 /hour. Post-study, the patient indicated that sleep was the same as usual.   DIAGNOSIS 1. Obstructive Sleep Apnea  2. Insufficient treatment with CPAP   3. CPAP intolerance   4. Dysfunctions associated with sleep stages or arousal from sleep    5. Central sleep apnea 6. Nocturnal hypoxemia  PLANS/RECOMMENDATIONS: 1. This study demonstrates no resolution of  the patient's obstructive sleep apnea with CPAP or BiPAP of up to 16/12 cm and evidence of ongoing OSA and central sleep apnea. The patient will be advised to return for a full night BiPAP titration and possible treatment with BiPAP ST or ASV therapy. I will, in the interim, start the patient on home BiPAP treatment at a pressure of 17/13 cm via mask of her choice with heated humidity. The patient should be reminded to be fully compliant with PAP therapy to improve sleep related symptoms and decrease long term cardiovascular risks. The patient should be reminded, that it may take up to 3 months to get fully used to using PAP with all planned sleep. The earlier full compliance is achieved, the better long term compliance tends to be. Please note that untreated obstructive sleep apnea carries additional perioperative morbidity. Patients with significant obstructive sleep apnea should receive perioperative PAP therapy and the surgeons and particularly the anesthesiologist should be informed of the diagnosis and the severity of the sleep disordered breathing. 2. If not already established, the patient will be advised to see a pulmonologist.  3. This study shows sleep fragmentation and abnormal sleep stage percentages; these are nonspecific findings and per se do not signify an intrinsic sleep disorder or a cause for the patient's sleep-related symptoms. Causes include (but are not limited to) the first night effect of the sleep study, circadian rhythm disturbances, medication effect or an underlying mood disorder or medical problem.  4. The patient should be cautioned not to drive, work at heights, or operate dangerous or heavy equipment when tired or sleepy. Review and reiteration of good sleep hygiene measures should be pursued with any patient. Sedating medications and narcotic pain medications should be avoided as much as possible.  5. The patient will be seen in follow-up by Dr. Rexene Alberts at [facility] for discussion of  the test results and further management strategies. The referring provider will be notified of the test results.  I certify that I have reviewed the entire raw data recording prior to the issuance of this report in accordance with the Standards of Accreditation of the American Academy of Sleep Medicine (AASM)  Star Age, MD, PhD Diplomat, American Board of Psychiatry and Neurology (Neurology and Sleep Medicine)

## 2016-09-20 ENCOUNTER — Ambulatory Visit (INDEPENDENT_AMBULATORY_CARE_PROVIDER_SITE_OTHER): Payer: Medicaid Other | Admitting: Cardiovascular Disease

## 2016-09-20 ENCOUNTER — Encounter: Payer: Self-pay | Admitting: *Deleted

## 2016-09-20 ENCOUNTER — Telehealth: Payer: Self-pay

## 2016-09-20 ENCOUNTER — Encounter: Payer: Self-pay | Admitting: Cardiovascular Disease

## 2016-09-20 VITALS — BP 110/64 | HR 69 | Ht 64.0 in | Wt 172.0 lb

## 2016-09-20 DIAGNOSIS — I10 Essential (primary) hypertension: Secondary | ICD-10-CM

## 2016-09-20 DIAGNOSIS — J449 Chronic obstructive pulmonary disease, unspecified: Secondary | ICD-10-CM

## 2016-09-20 DIAGNOSIS — R0609 Other forms of dyspnea: Secondary | ICD-10-CM

## 2016-09-20 DIAGNOSIS — R600 Localized edema: Secondary | ICD-10-CM

## 2016-09-20 DIAGNOSIS — Z9989 Dependence on other enabling machines and devices: Secondary | ICD-10-CM

## 2016-09-20 DIAGNOSIS — G4733 Obstructive sleep apnea (adult) (pediatric): Secondary | ICD-10-CM

## 2016-09-20 NOTE — Patient Instructions (Addendum)
Your physician recommends that you schedule a follow-up appointment in: [place]  Your physician recommends that you continue on your current medications as directed. Please refer to the Current Medication list given to you today.  Your physician has requested that you have a lexiscan myoview. For further information please visit [email or web address]. Please follow instruction sheet, as given.  Your physician has requested that you have an echocardiogram. Echocardiography is a painless test that uses sound waves to create images of your heart. It provides your doctor with information about the size and shape of your heart and how well your heart's chambers and valves are working. This procedure takes approximately one hour. There are no restrictions for this procedure.  Thank you for choosing [facility]!!

## 2016-09-20 NOTE — Telephone Encounter (Signed)
I spoke to patient she is aware of results and recommendations. She does not have a pulmonologists and asks for a referral.   Orders will be sent to [place] per patient request. I will send report to PCP. Patient will wait for sleep lab to call her back and schedule sleep study.

## 2016-09-20 NOTE — Progress Notes (Signed)
CARDIOLOGY CONSULT NOTE  Patient ID: [name] MRN: [id number] DOB/AGE: [birth year] 51 y.o.  Admit date: (Not on file) Primary Physician: [place] Referring Physician:   Reason for Consultation: DOE, edema  HPI: 51 year old woman with history of hypertension, COPD, and sleep apnea referred for exertional dyspnea and edema.  She has noticed bilateral leg and ankle edema for the past 2 or 3 months. She was prescribed Lasix 20 mg daily by her PCP. She also complains of pain in the front of her shins. She denies calf, thigh, and quadricep claudication pain. She complains of the smallest 2 toes of her right foot occasionally cramping. She denies exertional chest pain. She has chronic exertional dyspnea due to COPD which has not gotten any worse. She quit smoking 7-8 years ago. She does complain of progressive fatigue. She was recently diagnosed with sleep apnea and has yet to start BiPAP. She complains of decreased energy levels over the past 2 months. She does have frequent palpitations.  Stays active by taking care of her 92 yr old son.   CT angiography of the chest 02/09/15 showed no pulmonary embolism and no acute cardiopulmonary disease. There is no mention of coronary artery calcifications.  Echocardiogram 02/23/14: Normal left ventricular systolic function, LVEF 28-00%, grade 1 diastolic dysfunction.   ECG performed in the office today which I personally reviewed demonstrates normal sinus rhythm with no ischemic ST segment or T-wave abnormalities, nor any arrhythmias.  Family history: Sister had an MI at age 36.    No Known Allergies  Current Outpatient Prescriptions  Medication Sig Dispense Refill  . albuterol (PROVENTIL HFA;VENTOLIN HFA) 108 (90 BASE) MCG/ACT inhaler Inhale 2 puffs into the lungs every 6 (six) hours as needed for wheezing or shortness of breath. For shortness of breath (Patient taking differently: Inhale 2 puffs into the lungs  every 6 (six) hours as needed for wheezing or shortness of breath. ) 1 Inhaler 1  . albuterol (PROVENTIL) (2.5 MG/3ML) 0.083% nebulizer solution Take 2.5 mg by nebulization every 6 (six) hours as needed for wheezing or shortness of breath.     Marland Kitchen amLODipine (NORVASC) 10 MG tablet Take 20 mg by mouth daily.    . citalopram (CELEXA) 20 MG tablet Take 20 mg by mouth daily.    . diazepam (VALIUM) 10 MG tablet Take 1 tablet (10 mg total) by mouth every 8 (eight) hours as needed for anxiety (also for muscle spasms). 45 tablet 0  . diltiazem (CARDIZEM CD) 240 MG 24 hr capsule Take 240 mg by mouth daily.    Marland Kitchen lisinopril (PRINIVIL,ZESTRIL) 20 MG tablet Take 1 tablet (20 mg total) by mouth daily. 30 tablet 0  . Omega-3 Fatty Acids (FISH OIL) 1200 MG CAPS Take 1,200 mg by mouth daily.     Marland Kitchen oxyCODONE-acetaminophen (PERCOCET) 10-325 MG per tablet Take 1-2 tablets by mouth every 4 (four) hours as needed for pain.     . pantoprazole (PROTONIX) 40 MG tablet TAKE 1 TABLET BY MOUTH TWICE DAILY BEFORE A MEAL. 60 tablet 5  . tiotropium (SPIRIVA) 18 MCG inhalation capsule Place 18 mcg into inhaler and inhale daily.     No current facility-administered medications for this visit.     Past Medical History:  Diagnosis Date  . Anxiety   . Anxiety disorder   . Asthma   . Chronic pain syndrome   . COPD (chronic obstructive pulmonary disease) ([place])   . Depression   . Epigastric  pain 03/29/2013  . Fatigue 03/29/2013  . Fibromyalgia   . GERD (gastroesophageal reflux disease)   . Hypertension   . Shortness of breath dyspnea     Past Surgical History:  Procedure Laterality Date  . BALLOON DILATION N/A 12/31/2013   Procedure: BALLOON DILATION;  Surgeon: Rogene Houston, MD;  [facility];  Service: Endoscopy;  Laterality: N/A;  . CARPAL TUNNEL RELEASE    . CARPAL TUNNEL RELEASE  07/23/2012   Procedure: CARPAL TUNNEL RELEASE;  Surgeon: Sanjuana Kava, MD;  Location: [facility];  Service: Orthopedics;   Laterality: Left;  . CESAREAN SECTION    . CHOLECYSTECTOMY    . cyst removed Left    had surgery to removed cyst  . ENDOMETRIAL ABLATION    . ESOPHAGOGASTRODUODENOSCOPY  07/31/2011   Procedure: ESOPHAGOGASTRODUODENOSCOPY (EGD);  Surgeon: Rogene Houston, MD;  [facility]: [facility];  Service: Endoscopy;  Laterality: N/A;  . ESOPHAGOGASTRODUODENOSCOPY N/A 12/31/2013   Procedure: ESOPHAGOGASTRODUODENOSCOPY (EGD);  Surgeon: Rogene Houston, MD;  Location: [facility];  Service: Endoscopy;  Laterality: N/A;  120-moved to [address] to notify pt  . ESOPHAGOGASTRODUODENOSCOPY (EGD) WITH ESOPHAGEAL DILATION N/A 09/30/2012   Procedure: ESOPHAGOGASTRODUODENOSCOPY (EGD) WITH ESOPHAGEAL DILATION;  Surgeon: Rogene Houston, MD;  [facility];  Service: Endoscopy;  Laterality: N/A;  245  . KNEE SURGERY Left   . MALONEY DILATION  07/31/2011   Procedure: MALONEY DILATION;  Surgeon: Rogene Houston, MD;  Location: [facility];  Service: Endoscopy;;  . Venia Minks DILATION N/A 12/31/2013   Procedure: Venia Minks DILATION;  Surgeon: Rogene Houston, MD;  Location: [facility];  Service: Endoscopy;  Laterality: N/A;  . MASS EXCISION  05/20/2012   Procedure: EXCISION MASS;  Surgeon: Sanjuana Kava, MD;  Location: [facility];  Service: Orthopedics;  Laterality: Left;  Repair of Fascial Defect Left Knee  . SAVORY DILATION N/A 12/31/2013   Procedure: SAVORY DILATION;  Surgeon: Rogene Houston, MD;  [facility];  Service: Endoscopy;  Laterality: N/A;  . SPINE SURGERY      Social History   Social History  . Marital status: Single    Spouse name: N/A  . Number of children: N/A  . Years of education: N/A   Occupational History  . N/A    Social History Main Topics  . Smoking status: Former Smoker    Types: Cigarettes    Quit date: 10/26/2011  . Smokeless tobacco: Never Used  . Alcohol use 1.2 oz/week    2 Cans of beer per week     Comment: social  . Drug use: No  . Sexual activity: Yes     Birth control/ protection: None, Surgical   Other Topics Concern  . Not on file   Social History Narrative   Denies any caffeine use       Prior to Admission medications   Medication Sig Start Date End Date Taking? Authorizing Provider  albuterol (PROVENTIL HFA;VENTOLIN HFA) 108 (90 BASE) MCG/ACT inhaler Inhale 2 puffs into the lungs every 6 (six) hours as needed for wheezing or shortness of breath. For shortness of breath Patient taking differently: Inhale 2 puffs into the lungs every 6 (six) hours as needed for wheezing or shortness of breath.  07/31/11   Kathie Dike, MD  albuterol (PROVENTIL) (2.5 MG/3ML) 0.083% nebulizer solution Take 2.5 mg by nebulization every 6 (six) hours as needed for wheezing or shortness of breath.     Historical Provider, MD  citalopram (CELEXA) 20 MG tablet Take 20 mg by mouth daily.    Historical Provider, MD  diazepam (VALIUM) 10 MG tablet Take 1 tablet (10 mg total) by mouth every 8 (eight) hours as needed for anxiety (also for muscle spasms). 09/26/14   Melina Schools, MD  diltiazem (CARDIZEM CD) 240 MG 24 hr capsule Take 240 mg by mouth daily.    Historical Provider, MD  lisinopril (PRINIVIL,ZESTRIL) 20 MG tablet Take 1 tablet (20 mg total) by mouth daily. 07/31/11   Kathie Dike, MD  mometasone-formoterol (DULERA) 200-5 MCG/ACT AERO Inhale 2 puffs into the lungs 2 (two) times daily as needed for shortness of breath.     Historical Provider, MD  Omega-3 Fatty Acids (FISH OIL) 1200 MG CAPS Take 1,200 mg by mouth daily.     Historical Provider, MD  oxyCODONE-acetaminophen (PERCOCET) 10-325 MG per tablet Take 1-2 tablets by mouth every 4 (four) hours as needed for pain.     Historical Provider, MD  pantoprazole (PROTONIX) 40 MG tablet TAKE 1 TABLET BY MOUTH TWICE DAILY BEFORE A MEAL. 02/14/16   Rogene Houston, MD  phentermine 37.5 MG capsule Take 37.5 mg by mouth daily.    Historical Provider, MD     Review of systems complete and found to be negative  unless listed above in HPI     Physical exam Blood pressure 110/64, pulse 69, height 5\' 4"  (1.626 m), weight 172 lb (78 kg), SpO2 96 %. General: NAD Neck: No JVD, no thyromegaly or thyroid nodule.  Lungs: Clear to auscultation bilaterally with normal respiratory effort. CV: Nondisplaced PMI. Regular rate and rhythm, normal S1/S2, no S3/S4, no murmur.  No peripheral edema.  No carotid bruit.  Normal pedal pulses.  Abdomen: Soft, nontender, no hepatosplenomegaly, no distention.  Skin: Intact without lesions or rashes.  Neurologic: Alert and oriented x 3.  Psych: Normal affect. Extremities: No clubbing or cyanosis.  HEENT: Normal.   ECG: Most recent ECG reviewed.  Telemetry: Independently reviewed.  Labs:   Lab Results  Component Value Date   WBC 7.1 02/11/2015   HGB 14.6 02/11/2015   HCT 43.5 02/11/2015   MCV 94.2 02/11/2015   PLT 243 02/11/2015   No results for input(s): NA, K, CL, CO2, BUN, CREATININE, CALCIUM, PROT, BILITOT, ALKPHOS, ALT, AST, GLUCOSE in the last 168 hours.  Invalid input(s): LABALBU Lab Results  Component Value Date   CKTOTAL 78 09/20/2011   CKMB 1.2 05/04/2008   TROPONINI 0.03 02/09/2015   No results found for: CHOL No results found for: HDL No results found for: LDLCALC No results found for: TRIG No results found for: CHOLHDL No results found for: LDLDIRECT       Studies: No results found.  ASSESSMENT AND PLAN:  1. Exertional dyspnea: While this may be related to COPD due to years of tobacco abuse, she has a family history of premature coronary artery disease and several members on the mother's side of her family have died of myocardial infarction. ECG is normal. I will proceed with a nuclear myocardial perfusion imaging study to evaluate for ischemic heart disease (Lexiscan Myoview).  2. Bilateral leg edema: Euvolemic on Lasix 20 mg daily. I will order a 2-D echocardiogram with Doppler to evaluate cardiac structure, function, and regional  wall motion.  3. Hypertension: Controlled. No changes.   Dispo: fu 6 weeks.  Signed: Kate Sable, M.D., F.A.C.C.  09/20/2016, 10:18 AM

## 2016-09-20 NOTE — Telephone Encounter (Signed)
-----   Message from Star Age, MD sent at 09/19/2016  8:40 AM EST ----- Patient referred by Dr. Gerarda Fraction, seen by me on 08/12/16, diagnostic PSG on 09/04/16, PAP titration on 09/17/16.  Please call and inform patient that I have entered an order for treatment with positive airway pressure (PAP) treatment of obstructive sleep apnea (OSA). She did not do well enough on CPAP or BiPAP, but I suggest we start her on empiric BiPAP at 17/13 cm and would recommend, she return for full night BiPAP titration (may need more sophisticated treatment than that) to optimize therapy. We will, therefore, arrange for a machine for home use through a DME (durable medical equipment) company of Her choice; and I will see the patient back in follow-up in about 8 weeks. I will also order a BiPAP titration.  Plus, please ask patient to see her lung doctor or ask PCP for a referral for a lung specialist, if need be, we can place a referral. She has lower oxygen saturations throughout the night, and as I recall, a Hx of COPD. Please also explain to the patient that I will be looking out for compliance data, which can be downloaded from the machine (stored on an SD card, that is inserted in the machine) or via remote access through a modem, that is built into the machine. At the time of the followup appointment we will discuss sleep study results and how it is going with PAP treatment at home. Please advise patient to bring Her machine at the time of the first FU visit, even though this is cumbersome. Bringing the machine for every visit after that will likely not be needed, but often helps for the first visit to troubleshoot if needed. Please re-enforce the importance of compliance with treatment and the need for [place] to monitor compliance data - often an insurance requirement and actually good feedback for the patient as far as how they are doing.  Also remind patient, that any interim PAP machine or mask issues should be first addressed with  the DME company, as they can often help better with technical and mask fit issues. Please ask if patient has a preference regarding DME company.  Please also make sure, the patient has a follow-up appointment with me in about 8-10 weeks from the setup date, thanks.  Once you have spoken to the patient - and faxed/routed report to PCP and referring MD (if other than PCP), you can close this encounter, thanks,   Star Age, MD, PhD [facility] ([place])

## 2016-09-23 NOTE — Telephone Encounter (Signed)
Please place pulmonology referral

## 2016-09-24 NOTE — Telephone Encounter (Signed)
Noted Referral has been sent . Pulmonology .

## 2016-09-24 NOTE — Telephone Encounter (Signed)
Placed referral to pulm.  Please process.

## 2016-09-24 NOTE — Addendum Note (Signed)
Addended by: Star Age on: 09/24/2016 03:42 PM   Modules accepted: Orders

## 2016-09-25 ENCOUNTER — Inpatient Hospital Stay (HOSPITAL_COMMUNITY): Admission: RE | Admit: 2016-09-25 | Payer: Medicaid Other | Source: Ambulatory Visit

## 2016-09-25 ENCOUNTER — Encounter (HOSPITAL_COMMUNITY): Payer: Medicaid Other

## 2016-09-26 ENCOUNTER — Other Ambulatory Visit (HOSPITAL_COMMUNITY): Payer: Medicaid Other

## 2016-10-01 ENCOUNTER — Inpatient Hospital Stay (HOSPITAL_COMMUNITY): Admission: RE | Admit: 2016-10-01 | Payer: Medicaid Other | Source: Ambulatory Visit

## 2016-10-01 ENCOUNTER — Ambulatory Visit (HOSPITAL_COMMUNITY): Payer: Medicaid Other

## 2016-10-01 ENCOUNTER — Other Ambulatory Visit (HOSPITAL_COMMUNITY): Payer: Medicaid Other

## 2016-10-03 ENCOUNTER — Encounter (HOSPITAL_COMMUNITY): Payer: Medicaid Other

## 2016-10-03 ENCOUNTER — Ambulatory Visit (HOSPITAL_COMMUNITY)
Admission: RE | Admit: 2016-10-03 | Discharge: 2016-10-03 | Disposition: A | Discharge status: Home / Self Care | Payer: Medicaid Other | Source: Ambulatory Visit | Attending: Cardiovascular Disease | Admitting: Cardiovascular Disease

## 2016-10-03 ENCOUNTER — Encounter (HOSPITAL_COMMUNITY): Payer: Self-pay

## 2016-10-03 ENCOUNTER — Other Ambulatory Visit (HOSPITAL_COMMUNITY): Payer: Medicaid Other

## 2016-10-03 DIAGNOSIS — I10 Essential (primary) hypertension: Secondary | ICD-10-CM | POA: Diagnosis not present

## 2016-10-03 DIAGNOSIS — J449 Chronic obstructive pulmonary disease, unspecified: Secondary | ICD-10-CM | POA: Diagnosis not present

## 2016-10-03 DIAGNOSIS — K219 Gastro-esophageal reflux disease without esophagitis: Secondary | ICD-10-CM | POA: Diagnosis not present

## 2016-10-03 DIAGNOSIS — R0609 Other forms of dyspnea: Secondary | ICD-10-CM | POA: Insufficient documentation

## 2016-10-03 DIAGNOSIS — Z87891 Personal history of nicotine dependence: Secondary | ICD-10-CM | POA: Insufficient documentation

## 2016-10-03 LAB — NM MYOCAR MULTI W/SPECT W/WALL MOTION / EF
CHL CUP NUCLEAR SSS: 3
CSEPPHR: 96 {beats}/min
LHR: 0.28
LV dias vol: 87 mL (ref 46–106)
LVSYSVOL: 38 mL
Rest HR: 70 {beats}/min
SDS: 2
SRS: 1
TID: 1.11

## 2016-10-03 MED ORDER — SODIUM CHLORIDE 0.9% FLUSH
INTRAVENOUS | Status: AC
  Administered 2016-10-03: 10 mL via INTRAVENOUS
  Filled 2016-10-03: qty 10

## 2016-10-03 MED ORDER — TECHNETIUM TC 99M TETROFOSMIN IV KIT
10.0000 | PACK | Freq: Once | INTRAVENOUS | Status: AC | PRN
  Administered 2016-10-03: 11 via INTRAVENOUS

## 2016-10-03 MED ORDER — REGADENOSON 0.4 MG/5ML IV SOLN
INTRAVENOUS | Status: AC
  Administered 2016-10-03: 0.4 mg via INTRAVENOUS
  Filled 2016-10-03: qty 5

## 2016-10-03 MED ORDER — TECHNETIUM TC 99M TETROFOSMIN IV KIT
30.0000 | PACK | Freq: Once | INTRAVENOUS | Status: AC | PRN
  Administered 2016-10-03: 31.8 via INTRAVENOUS

## 2016-10-03 NOTE — Progress Notes (Signed)
*  PRELIMINARY RESULTS* Echocardiogram 2D Echocardiogram has been performed.  Leavy Cella 10/03/2016, 1:25 PM

## 2016-10-07 ENCOUNTER — Telehealth: Payer: Self-pay | Admitting: Neurology

## 2016-10-07 NOTE — Telephone Encounter (Signed)
I called the patient to schedule the BiPap study and the patient wanted to know why she needs another study.  She has been for 2 sleep studies and has a machine now so she is confused.  I don't know how to answer the questions so will you please call her.

## 2016-10-09 NOTE — Telephone Encounter (Signed)
LM for patient to call me back. Dr. Rexene Alberts recommends a titration study so that we can see exactly what pressures she needs. We were not able to reach the optimal pressures during the last study.

## 2016-10-24 NOTE — Telephone Encounter (Signed)
Patient is scheduled for retest on 4/16, she has confirmed appt.

## 2016-11-01 ENCOUNTER — Ambulatory Visit: Payer: Medicaid Other | Admitting: Cardiovascular Disease

## 2016-11-01 ENCOUNTER — Encounter: Payer: Self-pay | Admitting: Cardiovascular Disease

## 2016-11-13 ENCOUNTER — Ambulatory Visit (INDEPENDENT_AMBULATORY_CARE_PROVIDER_SITE_OTHER): Payer: Medicaid Other | Admitting: Neurology

## 2016-11-13 DIAGNOSIS — G4733 Obstructive sleep apnea (adult) (pediatric): Secondary | ICD-10-CM | POA: Diagnosis not present

## 2016-11-13 DIAGNOSIS — Z789 Other specified health status: Secondary | ICD-10-CM

## 2016-11-13 DIAGNOSIS — G4731 Primary central sleep apnea: Secondary | ICD-10-CM

## 2016-11-13 DIAGNOSIS — Z9989 Dependence on other enabling machines and devices: Secondary | ICD-10-CM

## 2016-11-18 NOTE — Progress Notes (Signed)
Patient referred by Dr. Gerarda Fraction, seen by me on 08/12/16, diagnostic PSG on 09/04/16, PAP titration on 09/17/16, BiPAP titration on 11/13/16:  Please call and inform patient that I have entered an order for treatment with positive airway pressure (PAP) treatment of obstructive sleep apnea (OSA). She did reasonably well on BiPAP, and I suggest we use BiPAP of 13/9 cm. She should by now have a DME and BiPAP machine. I will see the patient back in follow-up in about 10 weeks. I will order BiPAP change.   Plus, please ask patient to see her lung doctor or ask PCP for a referral for a lung specialist, if need be, we can place a referral. Please also explain to the patient that I will be looking out for compliance data, which can be downloaded from the machine (stored on an SD card, that is inserted in the machine) or via remote access through a modem, that is built into the machine. At the time of the followup appointment we will discuss sleep study results and how it is going with PAP treatment at home. Please advise patient to bring Her machine at the time of the first FU visit, even though this is cumbersome. Bringing the machine for every visit after that will likely not be needed, but often helps for the first visit to troubleshoot if needed. Please re-enforce the importance of compliance with treatment and the need for [place] to monitor compliance data - often an insurance requirement and actually good feedback for the patient as far as how they are doing.  Also remind patient, that any interim PAP machine or mask issues should be first addressed with the DME company, as they can often help better with technical and mask fit issues. Please ask if patient has a preference regarding DME company.  Please also make sure, the patient has a follow-up appointment with me in about 10 weeks from the setup date, thanks.  Once you have spoken to the patient - and faxed/routed report to PCP and referring MD (if other than PCP), you  can close this encounter, thanks,   Star Age, MD, PhD [facility] ([place])

## 2016-11-18 NOTE — Procedures (Signed)
PATIENT'S NAME:  [name], [name] DOB:      25-Jul-1965      MR#:    [id number]     DATE OF RECORDING: 11/13/2016 REFERRING M.D.:  Margaretha Glassing, MD Study Performed:   CPAP  Titration HISTORY: 51 year old woman with a history of asthma, COPD, hypertension, reflux disease, chronic pain syndrome on chronic narcotic pain medication, anxiety, depression, fibromyalgia, and overweight state, who presents for full night BiPAP titration to treat her OSA and nocturnal hypoxemia. Epworth Sleepiness Scale at 17 points. The patient's weight 170 pounds with a height of 64 in, resulting in a BMI of 29. Kg/m2.The patient's neck circumference measured 15.5 inches. Patient had a baseline sleep study on 09/04/16 with an AHI of 12.7 and desaturation nadir of 81%. t had a baseline sleep study on 09/04/16 with an AHI of 12.7 and  desaturation nadir of 81%. She had a CPAP titration on 09/17/16 with an AHI of 31.2 and desaturation nadir of 83%, insufficient treatment results with CPAP and brief BiPAP and therefore was asked to return for a full night bilevel titration. She was also asked to consult with a pulmonologist.   CURRENT MEDICATIONS: Albuterol inhaler; Albuterol ned; Celexa; Valium; Cardizem CD; Lisinopril; Dulera; Fish oil; Percocet; Protonix; Phentermine  PROCEDURE:  This is a multichannel digital polysomnogram utilizing the SomnoStar 11.2 system.  Electrodes and sensors were applied and monitored per AASM Specifications.   EEG, EOG, Chin and Limb EMG, were sampled at 200 Hz.  ECG, Snore and Nasal Pressure, Thermal Airflow, Respiratory Effort, CPAP Flow and Pressure, Oximetry was sampled at 50 Hz. Digital video and audio were recorded.      The patient was fitted with a medium Airtouch N20 nasal mask. BiPAP was initiated at 9/5 cmH20 with heated humidity per AASM standards and pressure was advanced to 12/8 cmH20 because of hypopneas, apneas and desaturations. At a PAP pressure of 12/8 cmH20, there was a reduction of the  AHI to 4.6/hour with non-supine REM sleep achieved, and O2 nadir of 86%.     Lights Out was at 22:49 and Lights On at 05:27. Total recording time (TRT) was 399 minutes, with a total sleep time (TST) of 370 minutes. The patient's sleep latency was 4. REM latency was 65.5 minutes, which is borderline reduced.  The sleep efficiency was 92.7 %.    SLEEP ARCHITECTURE: WASO (Wake after sleep onset)  was 25 minutes with minimal to mild sleep fragmentation noted.  There were 7.5 minutes in Stage N1, 164.5 minutes Stage N2, 91 minutes Stage N3 and 107 minutes in Stage REM.  The percentage of Stage N1 was 2.%, Stage N2 was 44.5%, which is normal, Stage N3 was 24.6%, which is increased, and Stage R (REM sleep) was 28.9%, which is increased and likely represents REM rebound.  The arousals were noted as: 69 were spontaneous, 0 were associated with PLMs, 18 were associated with respiratory events.  Audio and video analysis did not show any abnormal or unusual movements, behaviors, phonations or vocalizations.  The patient took one bathroom break. The EKG was in keeping with normal sinus rhythm (NSR).   RESPIRATORY ANALYSIS:  There was a total of 28 respiratory events: 6 obstructive apneas, 18 central apneas and 0 mixed apneas with a total of 24 apneas and an apnea index (AI) of 3.9 /hour. There were 4 hypopneas with a hypopnea index of .6/hour. The patient also had 1 respiratory event related arousals (RERAs).      The total APNEA/HYPOPNEA INDEX  (  AHI) was 4.5 /hour and the total RESPIRATORY DISTURBANCE INDEX was 4.7 .hour  4 events occurred in REM sleep and 24 events in NREM. The REM AHI was 2.2 /hour versus a non-REM AHI of 5.5 /hour.  The patient spent 0 minutes of total sleep time in the supine position and 370 minutes in non-supine. The supine AHI was 0.0, versus a non-supine AHI of 4.5.  OXYGEN SATURATION & C02:  The baseline 02 saturation was 92%, with the lowest being 86%. Time spent below 89% saturation  equaled 87 minutes.  PERIODIC LIMB MOVEMENTS:    The patient had a total of 0 Periodic Limb Movements. The Periodic Limb Movement (PLM) index was 0 and the PLM Arousal index was 0 /hour.  Post-study, the patient indicated that sleep was better than usual.   DIAGNOSIS  1. Obstructive Sleep Apnea  2. Insufficient treatment with CPAP   3. CPAP intolerance   4. Central sleep apnea   PLANS/RECOMMENDATIONS:  1. This study demonstrates improvement of the patient's mixed sleep apnea with BiPAP. I will recommend a home BiPAP treatment pressure of 13/9 cm via nasal mask with heated humidity, as her final AHI and O2 nadir were suboptimal during this study. The patient should be reminded to be fully compliant with PAP therapy to improve sleep related symptoms and decrease long term cardiovascular risks. The patient should be reminded, that it may take up to 3 months to get fully used to using PAP with all planned sleep. The earlier full compliance is achieved, the better long term compliance tends to be. Please note that untreated obstructive sleep apnea carries additional perioperative morbidity. Patients with significant obstructive sleep apnea should receive perioperative PAP therapy and the surgeons and particularly the anesthesiologist should be informed of the diagnosis and the severity of the sleep disordered breathing. 2. If not already established, the patient will be advised to see a pulmonologist.  3. The patient should be cautioned not to drive, work at heights, or operate dangerous or heavy equipment when tired or sleepy. Review and reiteration of good sleep hygiene measures should be pursued with any patient. Sedating medications and narcotic pain medications should be avoided as much as possible.  4.        The patient will be seen in follow-up by Dr. Rexene Alberts at [facility] for discussion of the test results and further     management strategies. The referring provider will be notified of the test  results.  I certify that I have reviewed the entire raw data recording prior to the issuance of this report in accordance with the Standards of Accreditation of the American Academy of Sleep Medicine (AASM)       Star Age, MD, PhD Diplomat, American Board of Psychiatry and Neurology (Neurology and Sleep Medicine)

## 2016-11-18 NOTE — Addendum Note (Signed)
Addended by: Star Age on: 11/18/2016 08:18 AM   Modules accepted: Orders

## 2016-11-20 ENCOUNTER — Telehealth: Payer: Self-pay

## 2016-11-20 NOTE — Telephone Encounter (Signed)
-----   Message from Star Age, MD sent at 11/18/2016  8:18 AM EDT ----- Patient referred by Dr. Gerarda Fraction, seen by me on 08/12/16, diagnostic PSG on 09/04/16, PAP titration on 09/17/16, BiPAP titration on 11/13/16:  Please call and inform patient that I have entered an order for treatment with positive airway pressure (PAP) treatment of obstructive sleep apnea (OSA). She did reasonably well on BiPAP, and I suggest we use BiPAP of 13/9 cm. She should by now have a DME and BiPAP machine. I will see the patient back in follow-up in about 10 weeks. I will order BiPAP change.   Plus, please ask patient to see her lung doctor or ask PCP for a referral for a lung specialist, if need be, we can place a referral. Please also explain to the patient that I will be looking out for compliance data, which can be downloaded from the machine (stored on an SD card, that is inserted in the machine) or via remote access through a modem, that is built into the machine. At the time of the followup appointment we will discuss sleep study results and how it is going with PAP treatment at home. Please advise patient to bring Her machine at the time of the first FU visit, even though this is cumbersome. Bringing the machine for every visit after that will likely not be needed, but often helps for the first visit to troubleshoot if needed. Please re-enforce the importance of compliance with treatment and the need for [place] to monitor compliance data - often an insurance requirement and actually good feedback for the patient as far as how they are doing.  Also remind patient, that any interim PAP machine or mask issues should be first addressed with the DME company, as they can often help better with technical and mask fit issues. Please ask if patient has a preference regarding DME company.  Please also make sure, the patient has a follow-up appointment with me in about 10 weeks from the setup date, thanks.  Once you have spoken to the patient -  and faxed/routed report to PCP and referring MD (if other than PCP), you can close this encounter, thanks,   Star Age, MD, PhD [facility] ([place])

## 2016-11-20 NOTE — Telephone Encounter (Signed)
Patient is aware of results and recommendations and has f/u scheduled. Orders faxed in to DME

## 2016-12-18 ENCOUNTER — Telehealth: Payer: Self-pay

## 2016-12-18 NOTE — Telephone Encounter (Signed)
I called pt. She has not been compliant with bipap and needs to be compliant before Dr. Rexene Alberts sees her. Pt says that she has struggled with her mask and will call [facility] to switch masks. She is agreeable to pushing her appt out about one month. An appt was made for 02/06/2017 at 9:45 am with Hoyle Sauer, NP. Pt was reminded to bring her bipap to this appt and of PAP compliance expectations.

## 2016-12-19 ENCOUNTER — Ambulatory Visit: Payer: Medicaid Other | Admitting: Neurology

## 2017-02-06 ENCOUNTER — Encounter (INDEPENDENT_AMBULATORY_CARE_PROVIDER_SITE_OTHER): Payer: Self-pay

## 2017-02-06 ENCOUNTER — Encounter: Payer: Self-pay | Admitting: Nurse Practitioner

## 2017-02-06 ENCOUNTER — Ambulatory Visit (INDEPENDENT_AMBULATORY_CARE_PROVIDER_SITE_OTHER): Payer: Medicaid Other | Admitting: Nurse Practitioner

## 2017-02-06 DIAGNOSIS — G4733 Obstructive sleep apnea (adult) (pediatric): Secondary | ICD-10-CM | POA: Diagnosis not present

## 2017-02-06 NOTE — Patient Instructions (Signed)
Will attempt to wear the mask during the day and keep it tight  Follow up in 6 weeks with Dr. Rexene Alberts for compliance

## 2017-02-06 NOTE — Progress Notes (Addendum)
[facility]  PATIENT: [name] DOB: September 23, 1965   REASON FOR VISIT: follow up for BiPAP compliance HISTORY FROM:patient    HISTORY OF PRESENT ILLNESS: UPDATE 07/26/2018CM [name], 51 year old female returns for her first compliance report from her BIPAP. Sleep study did confirm the diagnosis of mild to mod obstructive sleep apnea with repeated desaturations into the 80s and O2 nadir of 81%. Patient reports that she is having trouble getting used to the mask.Compliance dated 01/06/2017 to 02/04/2017 shows one day greater than 4 hours and 15 days less than 4 hours. Pressures 9-13 cm. AHI 32.9 central apneas 17.0 ESS score is 11. Fatigue severity scale 19. Robin from the sleep lab came in to discuss compliance. Patient is not tightening  her mask therefore she is having leaks. She was advised to practice wearing it during the day watching TV or talking to get use to it with the proper fit. She returns for reevaluation  08/12/16 [facility] consultation of her sleep disorder, in particular concern for underlying obstructive sleep apnea. The patient is unaccompanied today. As you know, [name] is a 51 year old right-handed woman with an underlying medical history of asthma, COPD, hypertension, reflux disease, chronic pain syndrome on chronic narcotic pain medication, anxiety, depression, fibromyalgia, and overweight state, who reports snoring and excessive daytime somnolence. Snoring is reportedly loud at times and she has witnessed apneic pauses while asleep. I reviewed your office note from 06/12/2016, which you kindly included. She is on prn Valium. Her Epworth sleepiness score is 17 out of 24, her fatigue score is 29 out of 63. She denies falling asleep at the wheel. She does not like to nap, but may doze off easily when sedentary. She stopped the Celexa and remeron on her own. She is afraid of SEs.  She had back surgery on 09/22/14 and I reviewed the L spine Xray  from 09/22/14: IMPRESSION: Intraoperative fluoroscopy utilized for surgical ventral purposes, demonstrating posterior fixation from T11 through L3 with spacer at the anteriorly compressed L1.  She had CT L spine on 08/14/14 and I reviewed the results: IMPRESSION: Further collapse of the chronic L1 compression fracture, now with 50% loss of vertebral body height. This can be seen in normal healing with resorbtion around the fracture planes but can also be associated with posttraumatic osteonecrosis of the vertebral body (Kummel disease).  Her bedtime is between 9 and 10 PM, wakeup time is 6:10 a.m. She lives with her boyfriend and 74-year-old daughter. They have 1 dog in the household, not in the bedroom typically. She does not watch TV in the bedroom. She has difficulty maintaining sleep and often wakes up around 3 AM and has difficulty going back to sleep. She has significant nocturia, 3-4 times per average night. She denies morning headaches. She has gained weight. Her mother has sleep apnea and uses a CPAP machine. The patient quit smoking 5 years ago, she drinks alcohol a few times a week, typically no caffeine on a daily basis. She has some restlessness in her arms at times. She has some residual weakness in her left leg. 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  REVIEW OF SYSTEMS: Full 14 system review of systems performed and notable only for those listed, all others are neg:  Constitutional: neg  Cardiovascular: neg Ear/Nose/Throat: neg  Skin: neg Eyes: neg Respiratory: neg Gastroitestinal: neg  Hematology/Lymphatic: neg  Endocrine: neg Musculoskeletal:neg Allergy/Immunology: neg Neurological: neg Psychiatric: neg Sleep : obstructive  sleep apnea with BiPAP   ALLERGIES: No Known Allergies  HOME MEDICATIONS: Outpatient Medications Prior to Visit  Medication Sig Dispense Refill  . albuterol (PROVENTIL HFA;VENTOLIN HFA) 108 (90 BASE) MCG/ACT inhaler  Inhale 2 puffs into the lungs every 6 (six) hours as needed for wheezing or shortness of breath. For shortness of breath (Patient taking differently: Inhale 2 puffs into the lungs every 6 (six) hours as needed for wheezing or shortness of breath. ) 1 Inhaler 1  . albuterol (PROVENTIL) (2.5 MG/3ML) 0.083% nebulizer solution Take 2.5 mg by nebulization every 6 (six) hours as needed for wheezing or shortness of breath.     Marland Kitchen amLODipine (NORVASC) 10 MG tablet Take 20 mg by mouth daily.    . diazepam (VALIUM) 10 MG tablet Take 1 tablet (10 mg total) by mouth every 8 (eight) hours as needed for anxiety (also for muscle spasms). 45 tablet 0  . diltiazem (CARDIZEM CD) 240 MG 24 hr capsule Take 240 mg by mouth daily.    . furosemide (LASIX) 20 MG tablet Take 20 mg by mouth.    Marland Kitchen lisinopril (PRINIVIL,ZESTRIL) 20 MG tablet Take 1 tablet (20 mg total) by mouth daily. 30 tablet 0  . Omega-3 Fatty Acids (FISH OIL) 1200 MG CAPS Take 1,200 mg by mouth daily.     Marland Kitchen oxyCODONE-acetaminophen (PERCOCET) 10-325 MG per tablet Take 1-2 tablets by mouth every 4 (four) hours as needed for pain.     . pantoprazole (PROTONIX) 40 MG tablet TAKE 1 TABLET BY MOUTH TWICE DAILY BEFORE A MEAL. 60 tablet 5  . tiotropium (SPIRIVA) 18 MCG inhalation capsule Place 18 mcg into inhaler and inhale daily.    . citalopram (CELEXA) 20 MG tablet Take 20 mg by mouth daily.     No facility-administered medications prior to visit.     PAST MEDICAL HISTORY: Past Medical History:  Diagnosis Date  . Anxiety   . Anxiety disorder   . Asthma   . Chronic pain syndrome   . COPD (chronic obstructive pulmonary disease) ([place])   . Depression   . Epigastric pain 03/29/2013  . Fatigue 03/29/2013  . Fibromyalgia   . GERD (gastroesophageal reflux disease)   . Hypertension   . Shortness of breath dyspnea     PAST SURGICAL HISTORY: Past Surgical History:  Procedure Laterality Date  . BALLOON DILATION N/A 12/31/2013   Procedure: BALLOON DILATION;   Surgeon: Rogene Houston, MD;  [facility];  Service: Endoscopy;  Laterality: N/A;  . CARPAL TUNNEL RELEASE    . CARPAL TUNNEL RELEASE  07/23/2012   Procedure: CARPAL TUNNEL RELEASE;  Surgeon: Sanjuana Kava, MD;  Location: [facility] ORS;  Service: Orthopedics;  Laterality: Left;  . CESAREAN SECTION    . CHOLECYSTECTOMY    . cyst removed Left    had surgery to removed cyst  . ENDOMETRIAL ABLATION    . ESOPHAGOGASTRODUODENOSCOPY  07/31/2011   Procedure: ESOPHAGOGASTRODUODENOSCOPY (EGD);  Surgeon: Rogene Houston, MD;  Location: [facility];  Service: Endoscopy;  Laterality: N/A;  . ESOPHAGOGASTRODUODENOSCOPY N/A 12/31/2013   Procedure: ESOPHAGOGASTRODUODENOSCOPY (EGD);  Surgeon: Rogene Houston, MD;  [facility]: [facility];  Service: Endoscopy;  Laterality: N/A;  120-moved to 925 Ann to notify pt  . ESOPHAGOGASTRODUODENOSCOPY (EGD) WITH ESOPHAGEAL DILATION N/A 09/30/2012   Procedure: ESOPHAGOGASTRODUODENOSCOPY (EGD) WITH ESOPHAGEAL DILATION;  Surgeon: Rogene Houston, MD;  [facility];  Service: Endoscopy;  Laterality: N/A;  245  . KNEE SURGERY Left   .  MALONEY DILATION  07/31/2011   Procedure: MALONEY DILATION;  Surgeon: Rogene Houston, MD;  [facility]: [facility];  Service: Endoscopy;;  . Venia Minks DILATION N/A 12/31/2013   Procedure: Venia Minks DILATION;  Surgeon: Rogene Houston, MD;  [facility]: [facility];  Service: Endoscopy;  Laterality: N/A;  . MASS EXCISION  05/20/2012   Procedure: EXCISION MASS;  Surgeon: Sanjuana Kava, MD;  Location: [facility];  Service: Orthopedics;  Laterality: Left;  Repair of Fascial Defect Left Knee  . SAVORY DILATION N/A 12/31/2013   Procedure: SAVORY DILATION;  Surgeon: Rogene Houston, MD;  [facility];  Service: Endoscopy;  Laterality: N/A;  . SPINE SURGERY      FAMILY HISTORY: Family History  Problem Relation Age of Onset  . Asthma Mother   . Asthma Father   . Cancer Father     SOCIAL HISTORY: Social History   Social History   . Marital status: Single    Spouse name: N/A  . Number of children: N/A  . Years of education: N/A   Occupational History  . N/A    Social History Main Topics  . Smoking status: Former Smoker    Types: Cigarettes    Quit date: 10/26/2011  . Smokeless tobacco: Never Used  . Alcohol use 1.2 oz/week    2 Cans of beer per week     Comment: social  . Drug use: No  . Sexual activity: Yes    Birth control/ protection: None, Surgical   Other Topics Concern  . Not on file   Social History Narrative   Denies any caffeine use      PHYSICAL EXAM  Vitals:   02/06/17 0947  BP: 108/71  Pulse: 69  Weight: 156 lb 3.2 oz (70.9 kg)  Height: 5\' 4"  (1.626 m)   Body mass index is 26.81 kg/m.  Generalized: Well developed, in no acute distress  Head: normocephalic and atraumatic,. Oropharynx benign  Neck: Supple,  Musculoskeletal: No deformity   Neurological examination   Mentation: Alert oriented to time, place, history taking. Attention span and concentration appropriate. Recent and remote memory intact.  Follows all commands speech and language fluent.   Cranial nerve II-XII: Pupils were equal round reactive to light extraocular movements were full, visual field were full on confrontational test. Facial sensation and strength were normal. hearing was intact to finger rubbing bilaterally. Uvula tongue midline. head turning and shoulder shrug were normal and symmetric.Tongue protrusion into cheek strength was normal. Motor: normal bulk and tone, full strength in the BUE, BLE,  Coordination: finger-nose-finger, heel-to-shin bilaterally, no dysmetria Reflexes: Brachioradialis 2/2, biceps 2/2, triceps 2/2, patellar 2/2, Achilles 2/2, plantar responses were flexor bilaterally. Gait and Station: Rising up from seated position without assistance, normal stance,  moderate stride, good arm swing, smooth turning, able to perform tiptoe, and heel walking without difficulty. Tandem gait is  steady  DIAGNOSTIC DATA (LABS, IMAGING, TESTING) - I reviewed patient records, labs, notes, testing and imaging myself where available.  Lab Results  Component Value Date   WBC 7.1 02/11/2015   HGB 14.6 02/11/2015   HCT 43.5 02/11/2015   MCV 94.2 02/11/2015   PLT 243 02/11/2015      Component Value Date/Time   NA 140 02/11/2015 1620   K 3.3 (L) 02/11/2015 1620   CL 101 02/11/2015 1620   CO2 22 02/11/2015 1620   GLUCOSE 120 (H) 02/11/2015 1620   BUN 8 02/11/2015 1620   CREATININE 0.77 02/11/2015 1620  CALCIUM 10.0 02/11/2015 1620   PROT 8.5 (H) 02/11/2015 1620   ALBUMIN 4.9 02/11/2015 1620   AST 34 02/11/2015 1620   ALT 19 02/11/2015 1620   ALKPHOS 97 02/11/2015 1620   BILITOT 1.0 02/11/2015 1620   GFRNONAA >60 02/11/2015 1620   GFRAA >60 02/11/2015 1620    ASSESSMENT AND PLAN  51 y.o. year old female  has a past medical history of Anxiety; Anxiety disorder; Asthma; Chronic pain syndrome; COPD (chronic obstructive pulmonary disease) ([place]); Depression; ; Fatigue (03/29/2013); Fibromyalgia; GERD (gastroesophageal reflux disease); Hypertension; and Shortness of breath dyspnea. here to follow-up for obstructive sleep apneas with BiPAP compliance.Compliance dated 01/06/2017 to 02/04/2017 shows one day greater than 4 hours and 15 days less than 4 hours. Pressures 9-13 cm. AHI 32.9 central apneas    Will attempt to wear the mask during the day and keep it tight it was way too loose Review compliance data with patient and  explained findings Follow up in 6 weeks with Dr. Rexene Alberts for compliance Dennie Bible, [facility], [facility], [facility] [address], [place], [place] [address] [phone]  I reviewed the above note and documentation by the Nurse Practitioner and agree with the history, physical exam, assessment and plan as outlined above. I was immediately available for face-to-face consultation. Star Age, MD, PhD [facility])

## 2017-02-15 ENCOUNTER — Other Ambulatory Visit (INDEPENDENT_AMBULATORY_CARE_PROVIDER_SITE_OTHER): Payer: Self-pay | Admitting: Internal Medicine

## 2017-02-17 NOTE — Telephone Encounter (Signed)
Patient will need to have OV prior to further refills. She was last seen in the office 2015. Or the patient may get this prescription from her PCP.

## 2017-03-13 ENCOUNTER — Telehealth: Payer: Self-pay

## 2017-03-13 NOTE — Telephone Encounter (Signed)
Pt was somehow double booked with another pt on 9/27/ at 11:30. Will need to r/s her appt. Pt is feeling much better on bipap and says that it is working better with the better mask. Pt is agreeable to seeing Dr. Rexene Alberts on 05/13/17 at 11:30 instead of 04/10/17. Pt verbalized understanding of new appt date and time.

## 2017-03-18 ENCOUNTER — Other Ambulatory Visit (HOSPITAL_COMMUNITY): Payer: Self-pay | Admitting: Respiratory Therapy

## 2017-03-18 DIAGNOSIS — J441 Chronic obstructive pulmonary disease with (acute) exacerbation: Secondary | ICD-10-CM

## 2017-03-19 ENCOUNTER — Encounter (INDEPENDENT_AMBULATORY_CARE_PROVIDER_SITE_OTHER): Payer: Self-pay | Admitting: Internal Medicine

## 2017-03-19 NOTE — Telephone Encounter (Signed)
An appointment was given for 04/17/17 at 9:00am with Deberah Castle, NP.  A letter was mailed to the patient.

## 2017-03-26 ENCOUNTER — Other Ambulatory Visit (HOSPITAL_COMMUNITY): Payer: Self-pay | Admitting: Pulmonary Disease

## 2017-03-26 ENCOUNTER — Ambulatory Visit (HOSPITAL_COMMUNITY)
Admission: RE | Admit: 2017-03-26 | Discharge: 2017-03-26 | Disposition: A | Discharge status: Home / Self Care | Payer: Medicaid Other | Source: Ambulatory Visit | Attending: Pulmonary Disease | Admitting: Pulmonary Disease

## 2017-03-26 DIAGNOSIS — J449 Chronic obstructive pulmonary disease, unspecified: Secondary | ICD-10-CM | POA: Insufficient documentation

## 2017-03-26 LAB — PULMONARY FUNCTION TEST
DL/VA % pred: 84 %
DL/VA: 4.06 ml/min/mmHg/L
DLCO UNC: 16.44 ml/min/mmHg
DLCO cor % pred: 67 %
DLCO cor: 16.44 ml/min/mmHg
DLCO unc % pred: 67 %
FEF 25-75 PRE: 0.77 L/s
FEF 25-75 Post: 1.65 L/sec
FEF2575-%CHANGE-POST: 115 %
FEF2575-%PRED-POST: 59 %
FEF2575-%PRED-PRE: 27 %
FEV1-%Change-Post: 38 %
FEV1-%PRED-POST: 71 %
FEV1-%Pred-Pre: 51 %
FEV1-PRE: 1.46 L
FEV1-Post: 2.03 L
FEV1FVC-%CHANGE-POST: 16 %
FEV1FVC-%Pred-Pre: 67 %
FEV6-%Change-Post: 21 %
FEV6-%Pred-Post: 91 %
FEV6-%Pred-Pre: 75 %
FEV6-PRE: 2.63 L
FEV6-Post: 3.19 L
FEV6FVC-%Change-Post: 1 %
FEV6FVC-%PRED-PRE: 101 %
FEV6FVC-%Pred-Post: 102 %
FVC-%Change-Post: 19 %
FVC-%PRED-POST: 90 %
FVC-%Pred-Pre: 75 %
FVC-PRE: 2.69 L
FVC-Post: 3.21 L
POST FEV1/FVC RATIO: 63 %
PRE FEV1/FVC RATIO: 54 %
Post FEV6/FVC ratio: 99 %
Pre FEV6/FVC Ratio: 98 %
RV % pred: 175 %
RV: 3.11 L
TLC % pred: 99 %
TLC: 5.03 L

## 2017-03-26 MED ORDER — ALBUTEROL SULFATE (2.5 MG/3ML) 0.083% IN NEBU
2.5000 mg | INHALATION_SOLUTION | Freq: Once | RESPIRATORY_TRACT | Status: AC
  Administered 2017-03-26: 2.5 mg via RESPIRATORY_TRACT

## 2017-04-01 ENCOUNTER — Telehealth (INDEPENDENT_AMBULATORY_CARE_PROVIDER_SITE_OTHER): Payer: Self-pay | Admitting: Internal Medicine

## 2017-04-01 NOTE — Telephone Encounter (Signed)
Patient was sent a letter stating she needed to be seen prior to anymore medication refills.  She wants to know what medications she gets from [place].  [phone]

## 2017-04-01 NOTE — Telephone Encounter (Signed)
Patient was called and given the information about the Pantoprazole. She states that she has an appointment with the Social Security office on the 4 th of October the same day as she does here. She will need to have another appointment made for here Patient was advised that that our receptionist would give her a call back later this afternoon or in the morning.

## 2017-04-02 NOTE — Telephone Encounter (Signed)
I called the patient back and rescheduled her for 04/24/17 at 11:30am with Deberah Castle, NP.

## 2017-04-10 ENCOUNTER — Ambulatory Visit: Payer: Medicaid Other | Admitting: Neurology

## 2017-04-17 ENCOUNTER — Ambulatory Visit (INDEPENDENT_AMBULATORY_CARE_PROVIDER_SITE_OTHER): Payer: Self-pay | Admitting: Internal Medicine

## 2017-04-24 ENCOUNTER — Ambulatory Visit (INDEPENDENT_AMBULATORY_CARE_PROVIDER_SITE_OTHER): Payer: Self-pay | Admitting: Internal Medicine

## 2017-04-28 ENCOUNTER — Emergency Department (HOSPITAL_COMMUNITY)
Admission: EM | Admit: 2017-04-28 | Discharge: 2017-04-28 | Disposition: A | Discharge status: Home / Self Care | Payer: Medicaid Other | Attending: Emergency Medicine | Admitting: Emergency Medicine

## 2017-04-28 ENCOUNTER — Encounter (HOSPITAL_COMMUNITY): Payer: Self-pay

## 2017-04-28 DIAGNOSIS — I10 Essential (primary) hypertension: Secondary | ICD-10-CM | POA: Insufficient documentation

## 2017-04-28 DIAGNOSIS — Y939 Activity, unspecified: Secondary | ICD-10-CM | POA: Diagnosis not present

## 2017-04-28 DIAGNOSIS — S08121A Partial traumatic amputation of right ear, initial encounter: Secondary | ICD-10-CM | POA: Insufficient documentation

## 2017-04-28 DIAGNOSIS — Y929 Unspecified place or not applicable: Secondary | ICD-10-CM | POA: Diagnosis not present

## 2017-04-28 DIAGNOSIS — W5511XA Bitten by horse, initial encounter: Secondary | ICD-10-CM | POA: Diagnosis not present

## 2017-04-28 DIAGNOSIS — Z79899 Other long term (current) drug therapy: Secondary | ICD-10-CM | POA: Diagnosis not present

## 2017-04-28 DIAGNOSIS — Y999 Unspecified external cause status: Secondary | ICD-10-CM | POA: Diagnosis not present

## 2017-04-28 DIAGNOSIS — Z23 Encounter for immunization: Secondary | ICD-10-CM | POA: Diagnosis not present

## 2017-04-28 DIAGNOSIS — J449 Chronic obstructive pulmonary disease, unspecified: Secondary | ICD-10-CM | POA: Diagnosis not present

## 2017-04-28 DIAGNOSIS — Z87891 Personal history of nicotine dependence: Secondary | ICD-10-CM | POA: Insufficient documentation

## 2017-04-28 LAB — COMPREHENSIVE METABOLIC PANEL
ALK PHOS: 125 U/L (ref 38–126)
ALT: 31 U/L (ref 14–54)
AST: 28 U/L (ref 15–41)
Albumin: 4.2 g/dL (ref 3.5–5.0)
Anion gap: 10 (ref 5–15)
BILIRUBIN TOTAL: 0.3 mg/dL (ref 0.3–1.2)
BUN: 12 mg/dL (ref 6–20)
CALCIUM: 9.4 mg/dL (ref 8.9–10.3)
CO2: 24 mmol/L (ref 22–32)
CREATININE: 0.68 mg/dL (ref 0.44–1.00)
Chloride: 102 mmol/L (ref 101–111)
GFR calc Af Amer: >60 mL/min (ref >60)
Glucose, Bld: 182 mg/dL — ABNORMAL HIGH (ref 65–99)
POTASSIUM: 3.7 mmol/L (ref 3.5–5.1)
Sodium: 136 mmol/L (ref 135–145)
TOTAL PROTEIN: 7.8 g/dL (ref 6.5–8.1)

## 2017-04-28 LAB — CBC WITH DIFFERENTIAL/PLATELET
BASOS ABS: 0 10*3/uL (ref 0.0–0.1)
Basophils Relative: 0 %
Eosinophils Absolute: 0.2 10*3/uL (ref 0.0–0.7)
Eosinophils Relative: 3 %
HEMATOCRIT: 42.1 % (ref 36.0–46.0)
Hemoglobin: 14 g/dL (ref 12.0–15.0)
LYMPHS PCT: 30 %
Lymphs Abs: 1.7 10*3/uL (ref 0.7–4.0)
MCH: 33.5 pg (ref 26.0–34.0)
MCHC: 33.3 g/dL (ref 30.0–36.0)
MCV: 100.7 fL — AB (ref 78.0–100.0)
MONO ABS: 0.5 10*3/uL (ref 0.1–1.0)
MONOS PCT: 8 %
NEUTROS ABS: 3.3 10*3/uL (ref 1.7–7.7)
Neutrophils Relative %: 59 %
Platelets: 215 10*3/uL (ref 150–400)
RBC: 4.18 MIL/uL (ref 3.87–5.11)
RDW: 12.5 % (ref 11.5–15.5)
WBC: 5.7 10*3/uL (ref 4.0–10.5)

## 2017-04-28 MED ORDER — SODIUM CHLORIDE 0.9 % IV SOLN
3.0000 g | Freq: Once | INTRAVENOUS | Status: AC
  Administered 2017-04-28: 3 g via INTRAVENOUS
  Filled 2017-04-28: qty 3

## 2017-04-28 MED ORDER — MORPHINE SULFATE (PF) 4 MG/ML IV SOLN
4.0000 mg | Freq: Once | INTRAVENOUS | Status: AC
  Administered 2017-04-28: 4 mg via INTRAVENOUS
  Filled 2017-04-28: qty 1

## 2017-04-28 MED ORDER — FENTANYL CITRATE (PF) 100 MCG/2ML IJ SOLN
50.0000 ug | Freq: Once | INTRAMUSCULAR | Status: AC
  Administered 2017-04-28: 50 ug via INTRAVENOUS
  Filled 2017-04-28: qty 2

## 2017-04-28 MED ORDER — LIDOCAINE-EPINEPHRINE 1 %-1:100000 IJ SOLN
30.0000 mL | Freq: Once | INTRAMUSCULAR | Status: DC
  Filled 2017-04-28: qty 30

## 2017-04-28 MED ORDER — TETANUS-DIPHTH-ACELL PERTUSSIS 5-2.5-18.5 LF-MCG/0.5 IM SUSP
0.5000 mL | Freq: Once | INTRAMUSCULAR | Status: AC
  Administered 2017-04-28: 0.5 mL via INTRAMUSCULAR
  Filled 2017-04-28: qty 0.5

## 2017-04-28 MED ORDER — ONDANSETRON HCL 4 MG/2ML IJ SOLN
4.0000 mg | Freq: Once | INTRAMUSCULAR | Status: AC
  Administered 2017-04-28: 4 mg via INTRAVENOUS
  Filled 2017-04-28: qty 2

## 2017-04-28 MED ORDER — LIDOCAINE-EPINEPHRINE 1 %-1:100000 IJ SOLN
20.0000 mL | Freq: Once | INTRAMUSCULAR | Status: AC
  Administered 2017-04-28: 20 mL via INTRADERMAL
  Filled 2017-04-28: qty 20

## 2017-04-28 NOTE — ED Provider Notes (Signed)
[facility] EMERGENCY DEPARTMENT Provider Note   CSN: [id number] Arrival date & time: 04/28/17  1446     History   Chief Complaint Chief Complaint  Patient presents with  . Animal Bite    HPI [name] is a 51 y.o. female.  HPI Patient sustained horse bite to the right ear roughly 20 minutes prior to presentation. Partial amputation of the ear. Patient brought in the severed tissue as well. States she ate ice cream roughly 20 minutes prior to arrival. Unknown last tetanus. No other injury. Denies facial pain, headache, neck pain.  Past Medical History:  Diagnosis Date  . Anxiety   . Anxiety disorder   . Asthma   . Chronic pain syndrome   . COPD (chronic obstructive pulmonary disease) ([place])   . Depression   . Epigastric pain 03/29/2013  . Fatigue 03/29/2013  . Fibromyalgia   . GERD (gastroesophageal reflux disease)   . Hypertension   . Shortness of breath dyspnea     Patient Active Problem List   Diagnosis Date Noted  . Obstructive sleep apnea treated with BiPAP 02/06/2017  . Vomiting 02/09/2015  . Nausea & vomiting 02/09/2015  . Burst fracture of lumbar vertebra ([place]) 09/22/2014  . Fall   . Compression fracture of L1 lumbar vertebra ([facility]) 06/25/2014  . Chest pain 02/22/2014  . Chest pain at rest 02/22/2014  . Abdominal pain, other specified site 04/29/2013  . Back pain 04/29/2013  . Epigastric pain 03/29/2013  . Fatigue 03/29/2013  . Dysphagia, unspecified(787.20) 09/24/2012  . COPD exacerbation ([place]) 08/29/2011  . Musculoskeletal chest pain 08/29/2011  . Hypokalemia 08/29/2011  . Asthma 07/28/2011  . Chronic obstructive asthma with exacerbation ([place]) 07/28/2011  . COPD (chronic obstructive pulmonary disease) ([place])   . Hypertension   . GERD (gastroesophageal reflux disease)   . Anxiety disorder   . Chronic pain syndrome     Past Surgical History:  Procedure Laterality Date  . BALLOON DILATION N/A 12/31/2013   Procedure: BALLOON DILATION;   Surgeon: Rogene Houston, MD;  Location: [facility] ENDO SU[facility];  Service: Endoscopy;  Laterality: N/A;  . CARPAL TUNNEL RELEASE    . CARPAL TUNNEL RELEASE  07/23/2012   Procedure: CARPAL TUNNEL RELEASE;  Surgeon: Sanjuana Kava, MD;  Location: [facility] ORS;  Service: Orthopedics;  Laterality: Left;  . CESAREAN SECTION    . CHOLECYSTECTOMY    . cyst removed Left    had surgery to removed cyst  . ENDOMETRIAL ABLATION    . ESOPHAGOGASTRODUODENOSCOPY  07/31/2011   Procedure: ESOPHAGOGASTRODUODENOSCOPY (EGD);  Surgeon: Rogene Houston, MD;  Location: [facility];  Service: Endoscopy;  Laterality: N/A;  . ESOPHAGOGASTRODUODENOSCOPY N/A 12/31/2013   Procedure: ESOPHAGOGASTRODUODENOSCOPY (EGD);  Surgeon: Rogene Houston, MD;  Location: [facility];  Service: Endoscopy;  Laterality: N/A;  120-moved to 925 Ann to notify pt  . ESOPHAGOGASTRODUODENOSCOPY (EGD) WITH ESOPHAGEAL DILATION N/A 09/30/2012   Procedure: ESOPHAGOGASTRODUODENOSCOPY (EGD) WITH ESOPHAGEAL DILATION;  Surgeon: Rogene Houston, MD;  Location: [facility];  Service: Endoscopy;  Laterality: N/A;  245  . KNEE SURGERY Left   . MALONEY DILATION  07/31/2011   Procedure: MALONEY DILATION;  Surgeon: Rogene Houston, MD;  Location: [facility];  Service: Endoscopy;;  . Venia Minks DILATION N/A 12/31/2013   Procedure: Venia Minks DILATION;  Surgeon: Rogene Houston, MD;  Location: [facility];  Service: Endoscopy;  Laterality: N/A;  . MASS EXCISION  05/20/2012   Procedure: EXCISION MASS;  Surgeon: Sanjuana Kava, MD;  Location: [facility] ORS;  Service: Orthopedics;  Laterality: Left;  Repair of Fascial Defect Left Knee  . SAVORY DILATION N/A 12/31/2013   Procedure: SAVORY DILATION;  Surgeon: Rogene Houston, MD;  Location: [facility];  Service: Endoscopy;  Laterality: N/A;  . SPINE SURGERY      OB History    Gravida Para Term Preterm AB Living   2 2       1    SAB TAB Ectopic Multiple Live Births           2       Home Medications    Prior to Admission  medications   Medication Sig Start Date End Date Taking? Authorizing Provider  albuterol (PROVENTIL HFA;VENTOLIN HFA) 108 (90 BASE) MCG/ACT inhaler Inhale 2 puffs into the lungs every 6 (six) hours as needed for wheezing or shortness of breath. For shortness of breath Patient taking differently: Inhale 2 puffs into the lungs every 6 (six) hours as needed for wheezing or shortness of breath.  07/31/11   Kathie Dike, MD  albuterol (PROVENTIL) (2.5 MG/3ML) 0.083% nebulizer solution Take 2.5 mg by nebulization every 6 (six) hours as needed for wheezing or shortness of breath.     [provider]  amLODipine (NORVASC) 10 MG tablet Take 20 mg by mouth daily.    [provider]  diazepam (VALIUM) 10 MG tablet Take 1 tablet (10 mg total) by mouth every 8 (eight) hours as needed for anxiety (also for muscle spasms). 09/26/14   Melina Schools, MD  diltiazem (CARDIZEM CD) 240 MG 24 hr capsule Take 240 mg by mouth daily.    [provider]  furosemide (LASIX) 20 MG tablet Take 20 mg by mouth.    [provider]  lisinopril (PRINIVIL,ZESTRIL) 20 MG tablet Take 1 tablet (20 mg total) by mouth daily. 07/31/11   Kathie Dike, MD  Omega-3 Fatty Acids (FISH OIL) 1200 MG CAPS Take 1,200 mg by mouth daily.     [provider]  oxyCODONE-acetaminophen (PERCOCET) 10-325 MG per tablet Take 1-2 tablets by mouth every 4 (four) hours as needed for pain.     [provider]  pantoprazole (PROTONIX) 40 MG tablet TAKE 1 TABLET BY MOUTH TWICE DAILY BEFORE A MEAL. 02/17/17   Rehman, Mechele Dawley, MD  tiotropium (SPIRIVA) 18 MCG inhalation capsule Place 18 mcg into inhaler and inhale daily.    [provider]    Family History Family History  Problem Relation Age of Onset  . Asthma Mother   . Asthma Father   . Cancer Father     Social History Social History  Substance Use Topics  . Smoking status: Former Smoker    Types: Cigarettes    Quit date: 10/26/2011  .  Smokeless tobacco: Never Used  . Alcohol use 1.2 oz/week    2 Cans of beer per week     Comment: social     Allergies   Patient has no known allergies.   Review of Systems Review of Systems  Constitutional: Negative for chills and fever.  HENT: Positive for ear pain. Negative for ear discharge, facial swelling and hearing loss.   Eyes: Negative for visual disturbance.  Respiratory: Negative for cough and shortness of breath.   Cardiovascular: Negative for chest pain.  Gastrointestinal: Negative for abdominal pain, nausea and vomiting.  Musculoskeletal: Negative for back pain, myalgias and neck pain.  Skin: Positive for wound.  Neurological: Negative for syncope, weakness, numbness and headaches.  All other  systems reviewed and are negative.    Physical Exam Updated Vital Signs BP 131/79 (BP Location: Right Arm)   Pulse 91   Temp 98.2 F (36.8 C) (Oral)   Resp 18   Ht 5\' 3"  (1.6 m)   Wt 68 kg (150 lb)   SpO2 99%   BMI 26.57 kg/m   Physical Exam  Constitutional: She is oriented to person, place, and time. She appears well-developed and well-nourished.  HENT:  Head: Normocephalic.  Mouth/Throat: Oropharynx is clear and moist.  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 Exposure cartilage. No active bleeding. Midface is stable. No malocclusion.  Eyes: Pupils are equal, round, and reactive to light. EOM are normal.  Neck: Normal range of motion. Neck supple.  No posterior midline cervical tenderness to palpation.  Cardiovascular: Normal rate and regular rhythm.   Pulmonary/Chest: Effort normal and breath sounds normal.  Abdominal: Soft. Bowel sounds are normal. There is no tenderness. There is no rebound and no guarding.  Musculoskeletal: Normal range of motion. She exhibits no edema or tenderness.  Neurological: She is alert and oriented to person, place, and time.  Moves all extremities without deficit.  Sensation intact.  Skin: Skin is warm and dry. Capillary refill takes less than 2 seconds. No rash noted. No erythema.  Psychiatric: She has a normal mood and affect. Her behavior is normal.  Nursing note and vitals reviewed.    ED Treatments / Results  Labs (all labs ordered are listed, but only abnormal results are displayed) Labs Reviewed  CBC WITH DIFFERENTIAL/PLATELET - Abnormal; Notable for the following:       Result Value   MCV 100.7 (*)    All other components within normal limits  COMPREHENSIVE METABOLIC PANEL    EKG  EKG Interpretation None       Radiology No results found.  Procedures Procedures (including critical care time)  Medications Ordered in ED Medications  Ampicillin-Sulbactam (UNASYN) 3 g in sodium chloride 0.9 % 100 mL IVPB (3 g Intravenous New Bag/Given 04/28/17 1548)  Tdap (BOOSTRIX) injection 0.5 mL (0.5 mLs Intramuscular Given 04/28/17 1549)  fentaNYL (SUBLIMAZE) injection 50 mcg (50 mcg Intravenous Given 04/28/17 1545)     Initial Impression / Assessment and Plan / ED Course  I have reviewed the triage vital signs and the nursing notes.  Pertinent labs & imaging results that were available during my care of the patient were reviewed by me and considered in my medical decision making (see chart for details).    Tetanus updated. Given dose of Unasyn. Discussed with Dr. Iran Planas who reviewed photo of partial amputation. Advised transfer to an emergency department. Will see in the emergency department and repair. Dr. Tamera Punt will accept in transfer.   Final Clinical Impressions(s) / ED Diagnoses   Final diagnoses:  Partial traumatic amputation of right ear, initial encounter    New Prescriptions New Prescriptions   No medications on file     Julianne Rice, MD 04/28/17 1551

## 2017-04-28 NOTE — ED Provider Notes (Signed)
Pt has arrived to the ED to see Dr Iran Planas.  I have ordered 1% lido with epi as requested.    Pt requested pain meds.  4 mg morphine IV ordered.    I spoke with Dr Iran Planas.  She will be coming in to evaluate and treat the patient for her complex ear laceration   Dorie Rank, MD 04/28/17 1807
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
## 2017-04-28 NOTE — ED Notes (Signed)
Pt stable, ambulatory, and verbalizes understanding of d/c instructions.  

## 2017-04-28 NOTE — ED Triage Notes (Signed)
Patient has right ear that was bitten off by horse. Pressure holding at this time for bleeding control.

## 2017-04-28 NOTE — ED Notes (Signed)
Applied

## 2017-04-29 ENCOUNTER — Telehealth: Payer: Self-pay | Admitting: Nurse Practitioner

## 2017-04-29 NOTE — Telephone Encounter (Signed)
It may be best for this pt to follow up with Robin in December to give her time to heal? Perhaps Shirlean Mylar can fit her in? I will cancel the appt in October with Dr. Rexene Alberts since pt cannot use her machine. If it is ok with Dr. Rexene Alberts, I will ask Shirlean Mylar to see the pt in  December for a cpap follow up to give her time to heal.

## 2017-04-29 NOTE — Telephone Encounter (Signed)
I spoke with patient  and explained with documentation she will not lose her Bipap machine. She will get her doctor to write a note and give it to [employer].

## 2017-04-29 NOTE — Telephone Encounter (Signed)
I would suggest, patient call us to reschedule, may see Robin or CM or myself.  I am sorry to hear she had this trauma, but she will have to heal before being able to use the headgear safely.

## 2017-04-29 NOTE — Telephone Encounter (Signed)
Pt called said her friends horse bit her ear and disfigured it yesterday. She has an appt in 10 days with plastic surgeon but in the meantime she will no be able to use bipap as she has stitches and does not want anything touching it. She knows insurance will not pay for bipap if she does not use it. Please call to discuss

## 2017-04-30 NOTE — Telephone Encounter (Signed)
I called pt. I asked her to call me when she could safely use her bipap again after her ear has healed. When she is able to use her bipap compliantly, we can reschedule her 05/13/17 appt. Pt verbalized understanding.

## 2017-05-12 ENCOUNTER — Other Ambulatory Visit (INDEPENDENT_AMBULATORY_CARE_PROVIDER_SITE_OTHER): Payer: Self-pay | Admitting: Internal Medicine

## 2017-05-13 ENCOUNTER — Ambulatory Visit: Payer: Self-pay | Admitting: Neurology

## 2017-05-14 ENCOUNTER — Encounter (INDEPENDENT_AMBULATORY_CARE_PROVIDER_SITE_OTHER): Payer: Self-pay | Admitting: Internal Medicine

## 2017-05-14 ENCOUNTER — Ambulatory Visit (INDEPENDENT_AMBULATORY_CARE_PROVIDER_SITE_OTHER): Payer: Medicaid Other | Admitting: Internal Medicine

## 2017-05-14 VITALS — BP 120/70 | HR 72 | Temp 98.2°F | Ht 64.0 in | Wt 165.9 lb

## 2017-05-14 DIAGNOSIS — K219 Gastro-esophageal reflux disease without esophagitis: Secondary | ICD-10-CM | POA: Diagnosis not present

## 2017-05-14 DIAGNOSIS — R1310 Dysphagia, unspecified: Secondary | ICD-10-CM

## 2017-05-14 DIAGNOSIS — R1319 Other dysphagia: Secondary | ICD-10-CM

## 2017-05-14 NOTE — Progress Notes (Signed)
Subjective:    Patient ID: [name], female    DOB: 11-Aug-1965, 51 y.o.   MRN: [id number]  HPI  Here today for f/u. She was last seen in 2015 by Dr. Laural Golden. Hx of chronic GERD and dysphagia.  She tells me she is doing good. Her appetite is good. She has gained 10 pounds since 2015. No dysphagia. GERD controlled with Protonix. BMs are normal.  She continues to ride horses.   12/31/2013 EGD/ED: Dr. Laural Golden:   Indications: Patient is 51 year old Caucasian female with GERD and history of esophageal web who presents with recurrent coughing spells burning in her throat as well as dysphagia.   Impression:  Small sliding hiatal hernia without ring or stricture formation.  Esophageal dilation with 56 French Maloney dilator resulted in linear mucosal disruption at proximal esophagus below UES indicative of a web.   Review of Systems Past Medical History:  Diagnosis Date  . Anxiety   . Anxiety disorder   . Asthma   . Chronic pain syndrome   . COPD (chronic obstructive pulmonary disease) ([place])   . Depression   . Epigastric pain 03/29/2013  . Fatigue 03/29/2013  . Fibromyalgia   . GERD (gastroesophageal reflux disease)   . Hypertension   . Shortness of breath dyspnea     Past Surgical History:  Procedure Laterality Date  . BALLOON DILATION N/A 12/31/2013   Procedure: BALLOON DILATION;  Surgeon: Rogene Houston, MD;  [facility];  Service: Endoscopy;  Laterality: N/A;  . CARPAL TUNNEL RELEASE    . CARPAL TUNNEL RELEASE  07/23/2012   Procedure: CARPAL TUNNEL RELEASE;  Surgeon: Sanjuana Kava, MD;  Location: [facility];  Service: Orthopedics;  Laterality: Left;  . CESAREAN SECTION    . CHOLECYSTECTOMY    . cyst removed Left    had surgery to removed cyst  . ENDOMETRIAL ABLATION    . ESOPHAGOGASTRODUODENOSCOPY  07/31/2011   Procedure: ESOPHAGOGASTRODUODENOSCOPY (EGD);  Surgeon: Rogene Houston, MD;  [facility];  Service: Endoscopy;  Laterality: N/A;  .  ESOPHAGOGASTRODUODENOSCOPY N/A 12/31/2013   Procedure: ESOPHAGOGASTRODUODENOSCOPY (EGD);  Surgeon: Rogene Houston, MD;  [facility];  Service: Endoscopy;  Laterality: N/A;  120-moved to 925 Ann to notify pt  . ESOPHAGOGASTRODUODENOSCOPY (EGD) WITH ESOPHAGEAL DILATION N/A 09/30/2012   Procedure: ESOPHAGOGASTRODUODENOSCOPY (EGD) WITH ESOPHAGEAL DILATION;  Surgeon: Rogene Houston, MD;  [facility];  Service: Endoscopy;  Laterality: N/A;  245  . KNEE SURGERY Left   . MALONEY DILATION  07/31/2011   Procedure: MALONEY DILATION;  Surgeon: Rogene Houston, MD;  [facility];  Service: Endoscopy;;  . Venia Minks DILATION N/A 12/31/2013   Procedure: Venia Minks DILATION;  Surgeon: Rogene Houston, MD;  [facility];  Service: Endoscopy;  Laterality: N/A;  . MASS EXCISION  05/20/2012   Procedure: EXCISION MASS;  Surgeon: Sanjuana Kava, MD;  Location: [facility];  Service: Orthopedics;  Laterality: Left;  Repair of Fascial Defect Left Knee  . SAVORY DILATION N/A 12/31/2013   Procedure: SAVORY DILATION;  Surgeon: Rogene Houston, MD;  [facility];  Service: Endoscopy;  Laterality: N/A;  . SPINE SURGERY      No Known Allergies  Current Outpatient Prescriptions on File Prior to Visit  Medication Sig Dispense Refill  . albuterol (PROVENTIL HFA;VENTOLIN HFA) 108 (90 BASE) MCG/ACT inhaler Inhale 2 puffs into the lungs every 6 (six) hours as needed for wheezing or shortness of breath. For  shortness of breath (Patient taking differently: Inhale 2 puffs into the lungs every 6 (six) hours as needed for wheezing or shortness of breath. ) 1 Inhaler 1  . albuterol (PROVENTIL) (2.5 MG/3ML) 0.083% nebulizer solution Take 2.5 mg by nebulization every 6 (six) hours as needed for wheezing or shortness of breath.     Marland Kitchen amLODipine (NORVASC) 10 MG tablet Take 20 mg by mouth daily.    . diazepam (VALIUM) 10 MG tablet Take 1 tablet (10 mg total) by mouth every 8 (eight) hours as needed  for anxiety (also for muscle spasms). 45 tablet 0  . diltiazem (CARDIZEM CD) 240 MG 24 hr capsule Take 240 mg by mouth daily.    . furosemide (LASIX) 20 MG tablet Take 20 mg by mouth daily.     Marland Kitchen lisinopril (PRINIVIL,ZESTRIL) 20 MG tablet Take 1 tablet (20 mg total) by mouth daily. 30 tablet 0  . Omega-3 Fatty Acids (FISH OIL) 1200 MG CAPS Take 1,200 mg by mouth daily.     Marland Kitchen oxyCODONE-acetaminophen (PERCOCET) 10-325 MG per tablet Take 1-2 tablets by mouth every 4 (four) hours as needed for pain.     . pantoprazole (PROTONIX) 40 MG tablet TAKE 1 TABLET BY MOUTH TWICE DAILY BEFORE A MEAL. 60 tablet 1  . tiotropium (SPIRIVA) 18 MCG inhalation capsule Place 18 mcg into inhaler and inhale daily.     No current facility-administered medications on file prior to visit.         Objective:   Physical Exam Blood pressure 120/70, pulse 72, temperature 98.2 F (36.8 C), height 5\' 4"  (1.626 m), weight 165 lb 14.4 oz (75.3 kg). Alert and oriented. Skin warm and dry. Oral mucosa is moist.   . Sclera anicteric, conjunctivae is pink. Thyroid not enlarged. No cervical lymphadenopathy. Bilateral soft wheezes. Heart regular rate and rhythm.  Abdomen is soft. Bowel sounds are positive. No hepatomegaly. No abdominal masses felt. No tenderness.  No edema to lower extremities.           Assessment & Plan:  GER controlled with Protonix. Dysphagia: No trouble at this time.  OV in 1 year.  OV in 1 year

## 2017-05-14 NOTE — Patient Instructions (Signed)
Continue the Protonix. OV in 1 year.  

## 2017-06-11 NOTE — Progress Notes (Addendum)
[facility]  PATIENT: [name] DOB: [birth year]   REASON FOR VISIT: follow up for BiPAP compliance HISTORY FROM:patient    HISTORY OF PRESENT ILLNESS:UPDATE 11/29/2018CM [name], 51 year old female returns for BiPAP compliance.  Data dated 01/27/2017 through 04/26/2017 shows greater than 4 hours at 77%.  Average usage 5 hours 53 minutes pressure 9-13 cm AHI 16.2.  Patient was seen in the ER on 10/15 2018 after being bitten on the right ear by a horse with right ear avulsion partial.  She had debridement and closure in the emergency room and is now seeing a Psychiatric nurse.  She will be having reconstruction.  She has been afraid to wear her CPAP mask since this happened because the mask strap goes right over the ear.  She returns for reevaluation     UPDATE 07/26/2018CM [name], 51 year old female returns for her first compliance report from her BIPAP. Sleep study did confirm the diagnosis of mild to mod obstructive sleep apnea with repeated desaturations into the 80s and O2 nadir of 81%. Patient reports that she is having trouble getting used to the mask.Compliance dated 01/06/2017 to 02/04/2017 shows one day greater than 4 hours and 15 days less than 4 hours. Pressures 9-13 cm. AHI 32.9 central apneas 17.0 ESS score is 11. Fatigue severity scale 19. Robin from the sleep lab came in to discuss compliance. Patient is not tightening  her mask therefore she is having leaks. She was advised to practice wearing it during the day watching TV or talking to get use to it with the proper fit. She returns for reevaluation  08/12/16 [facility] consultation of her sleep disorder, in particular concern for underlying obstructive sleep apnea. The patient is unaccompanied today. As you know, [name] is a 51 year old right-handed woman with an underlying medical history of asthma, COPD, hypertension, reflux disease, chronic pain syndrome on chronic narcotic pain medication,  anxiety, depression, fibromyalgia, and overweight state, who reports snoring and excessive daytime somnolence. Snoring is reportedly loud at times and she has witnessed apneic pauses while asleep. I reviewed your office note from 06/12/2016, which you kindly included. She is on prn Valium. Her Epworth sleepiness score is 17 out of 24, her fatigue score is 29 out of 63. She denies falling asleep at the wheel. She does not like to nap, but may doze off easily when sedentary. She stopped the Celexa and remeron on her own. She is afraid of SEs.  She had back surgery on 09/22/14 and I reviewed the L spine Xray from 09/22/14: IMPRESSION: Intraoperative fluoroscopy utilized for surgical ventral purposes, demonstrating posterior fixation from T11 through L3 with spacer at the anteriorly compressed L1.  She had CT L spine on 08/14/14 and I reviewed the results: IMPRESSION: Further collapse of the chronic L1 compression fracture, now with 50% loss of vertebral body height. This can be seen in normal healing with resorbtion around the fracture planes but can also be associated with posttraumatic osteonecrosis of the vertebral body (Kummel disease).  Her bedtime is between 9 and 10 PM, wakeup time is 6:10 a.m. She lives with her boyfriend and 61-year-old daughter. They have 1 dog in the household, not in the bedroom typically. She does not watch TV in the bedroom. She has difficulty maintaining sleep and often wakes up around 3 AM and has difficulty going back to sleep. She has significant nocturia, 3-4 times per average night. She denies morning headaches. She has gained weight. Her mother has sleep apnea  and uses a CPAP machine. The patient quit smoking 5 years ago, she drinks alcohol a few times a week, typically no caffeine on a daily basis. She has some restlessness in her arms at times. She has some residual weakness in her left leg. 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  REVIEW OF SYSTEMS: Full 14 system review of systems performed and notable only for those listed, all others are neg:  Constitutional: neg  Cardiovascular: neg Ear/Nose/Throat: neg  Skin: neg Eyes: neg Respiratory: neg Gastroitestinal: neg  Hematology/Lymphatic: neg  Endocrine: Intolerance to cold Musculoskeletal:neg Allergy/Immunology: neg Neurological: neg Psychiatric: Depression Sleep : obstructive sleep apnea with BiPAP   ALLERGIES: No Known Allergies  HOME MEDICATIONS: Outpatient Medications Prior to Visit  Medication Sig Dispense Refill  . albuterol (PROVENTIL HFA;VENTOLIN HFA) 108 (90 BASE) MCG/ACT inhaler Inhale 2 puffs into the lungs every 6 (six) hours as needed for wheezing or shortness of breath. For shortness of breath (Patient taking differently: Inhale 2 puffs into the lungs every 6 (six) hours as needed for wheezing or shortness of breath. ) 1 Inhaler 1  . albuterol (PROVENTIL) (2.5 MG/3ML) 0.083% nebulizer solution Take 2.5 mg by nebulization every 6 (six) hours as needed for wheezing or shortness of breath.     Marland Kitchen amLODipine (NORVASC) 10 MG tablet Take 20 mg by mouth daily.    . citalopram (CELEXA) 20 MG tablet Take by mouth.    . diazepam (VALIUM) 10 MG tablet Take 1 tablet (10 mg total) by mouth every 8 (eight) hours as needed for anxiety (also for muscle spasms). 45 tablet 0  . diltiazem (CARDIZEM CD) 240 MG 24 hr capsule Take 240 mg by mouth daily.    Marland Kitchen diltiazem (CARDIZEM) 120 MG tablet Take 120 mg by mouth daily.    . furosemide (LASIX) 20 MG tablet Take 20 mg by mouth daily.     Marland Kitchen lisinopril (PRINIVIL,ZESTRIL) 20 MG tablet Take 1 tablet (20 mg total) by mouth daily. 30 tablet 0  . Omega-3 Fatty Acids (FISH OIL PO) Take by mouth.    . Omega-3 Fatty Acids (FISH OIL) 1200 MG CAPS Take 1,200 mg by mouth daily.     Marland Kitchen oxyCODONE-acetaminophen (PERCOCET) 10-325 MG per tablet Take 1-2 tablets by mouth every 4 (four) hours as needed for pain.     .  pantoprazole (PROTONIX) 40 MG tablet TAKE 1 TABLET BY MOUTH TWICE DAILY BEFORE A MEAL. 60 tablet 1  . tiotropium (SPIRIVA) 18 MCG inhalation capsule Place 18 mcg into inhaler and inhale daily.    Marland Kitchen amLODipine (NORVASC) 10 MG tablet Take by mouth.    . diazepam (VALIUM) 10 MG tablet Take by mouth.    . furosemide (LASIX) 20 MG tablet Take by mouth.    . oxyCODONE-acetaminophen (PERCOCET) 10-325 MG tablet Take by mouth.    . pantoprazole (PROTONIX) 40 MG tablet TAKE 1 TABLET BY MOUTH TWICE DAILY BEFORE A MEAL.    Marland Kitchen tiotropium (SPIRIVA) 18 MCG inhalation capsule Place into inhaler and inhale.     No facility-administered medications prior to visit.     PAST MEDICAL HISTORY: Past Medical History:  Diagnosis Date  . Anxiety   . Anxiety disorder   . Asthma   . Chronic pain syndrome   . COPD (chronic obstructive pulmonary disease) ([place])   . Depression   . Epigastric pain 03/29/2013  . Fatigue 03/29/2013  . Fibromyalgia   . GERD (gastroesophageal reflux disease)   .  Hypertension   . Shortness of breath dyspnea     PAST SURGICAL HISTORY: Past Surgical History:  Procedure Laterality Date  . BALLOON DILATION N/A 12/31/2013   Procedure: BALLOON DILATION;  Surgeon: Rogene Houston, MD;  [facility];  Service: Endoscopy;  Laterality: N/A;  . CARPAL TUNNEL RELEASE    . CARPAL TUNNEL RELEASE  07/23/2012   Procedure: CARPAL TUNNEL RELEASE;  Surgeon: Sanjuana Kava, MD;  Location: [facility];  Service: Orthopedics;  Laterality: Left;  . CESAREAN SECTION    . CHOLECYSTECTOMY    . cyst removed Left    had surgery to removed cyst  . ENDOMETRIAL ABLATION    . ESOPHAGOGASTRODUODENOSCOPY  07/31/2011   Procedure: ESOPHAGOGASTRODUODENOSCOPY (EGD);  Surgeon: Rogene Houston, MD;  [facility];  Service: Endoscopy;  Laterality: N/A;  . ESOPHAGOGASTRODUODENOSCOPY N/A 12/31/2013   Procedure: ESOPHAGOGASTRODUODENOSCOPY (EGD);  Surgeon: Rogene Houston, MD;  [facility];  Service:  Endoscopy;  Laterality: N/A;  120-moved to 925 Ann to notify pt  . ESOPHAGOGASTRODUODENOSCOPY (EGD) WITH ESOPHAGEAL DILATION N/A 09/30/2012   Procedure: ESOPHAGOGASTRODUODENOSCOPY (EGD) WITH ESOPHAGEAL DILATION;  Surgeon: Rogene Houston, MD;  [facility];  Service: Endoscopy;  Laterality: N/A;  245  . KNEE SURGERY Left   . MALONEY DILATION  07/31/2011   Procedure: MALONEY DILATION;  Surgeon: Rogene Houston, MD;  [facility];  Service: Endoscopy;;  . Venia Minks DILATION N/A 12/31/2013   Procedure: Venia Minks DILATION;  Surgeon: Rogene Houston, MD;  [facility];  Service: Endoscopy;  Laterality: N/A;  . MASS EXCISION  05/20/2012   Procedure: EXCISION MASS;  Surgeon: Sanjuana Kava, MD;  Location: [facility];  Service: Orthopedics;  Laterality: Left;  Repair of Fascial Defect Left Knee  . SAVORY DILATION N/A 12/31/2013   Procedure: SAVORY DILATION;  Surgeon: Rogene Houston, MD;  [facility];  Service: Endoscopy;  Laterality: N/A;  . SPINE SURGERY      FAMILY HISTORY: Family History  Problem Relation Age of Onset  . Asthma Mother   . Asthma Father   . Cancer Father     SOCIAL HISTORY: Social History   Socioeconomic History  . Marital status: Single    Spouse name: Not on file  . Number of children: Not on file  . Years of education: Not on file  . Highest education level: Not on file  Social Needs  . Financial resource strain: Not on file  . Food insecurity - worry: Not on file  . Food insecurity - inability: Not on file  . Transportation needs - medical: Not on file  . Transportation needs - non-medical: Not on file  Occupational History  . Occupation: N/A  Tobacco Use  . Smoking status: Former Smoker    Types: Cigarettes    Last attempt to quit: 10/26/2011    Years since quitting: 5.6  . Smokeless tobacco: Never Used  Substance and Sexual Activity  . Alcohol use: Yes    Alcohol/week: 1.2 oz    Types: 2 Cans of beer per week     Comment: social  . Drug use: No  . Sexual activity: Yes    Birth control/protection: None, Surgical  Other Topics Concern  . Not on file  Social History Narrative   Denies any caffeine use      PHYSICAL EXAM  Vitals:   06/12/17 1004  BP: 113/78  Pulse: 85  Weight: 165 lb 9.6 oz (75.1 kg)  Height: 5\' 4"  (1.626 m)   Body mass index is 28.43 kg/m.  Generalized: Well developed, in no acute distress  Head: normocephalic and atraumatic,. Oropharynx benign  Neck: Supple,  Musculoskeletal: No deformity   Neurological examination   Mentation: Alert oriented to time, place, history taking. Attention span and concentration appropriate. Recent and remote memory intact.  Follows all commands speech and language fluent.   Cranial nerve II-XII: Pupils were equal round reactive to light extraocular movements were full, visual field were full on confrontational test. Facial sensation and strength were normal. hearing was intact to finger rubbing bilaterally. Uvula tongue midline. head turning and shoulder shrug were normal and symmetric.Tongue protrusion into cheek strength was normal. Motor: normal bulk and tone, full strength in the BUE, BLE,  Coordination: finger-nose-finger, heel-to-shin bilaterally, no dysmetria Reflexes: Brachioradialis 2/2, biceps 2/2, triceps 2/2, patellar 2/2, Achilles 2/2, plantar responses were flexor bilaterally. Gait and Station: Rising up from seated position without assistance, normal stance,  moderate stride, good arm swing, smooth turning, able to perform tiptoe, and heel walking without difficulty. Tandem gait is steady  DIAGNOSTIC DATA (LABS, IMAGING, TESTING) - I reviewed patient records, labs, notes, testing and imaging myself where available.  Lab Results  Component Value Date   WBC 5.7 04/28/2017   HGB 14.0 04/28/2017   HCT 42.1 04/28/2017   MCV 100.7 (H) 04/28/2017   PLT 215 04/28/2017      Component Value Date/Time   NA 136 04/28/2017 1521    K 3.7 04/28/2017 1521   CL 102 04/28/2017 1521   CO2 24 04/28/2017 1521   GLUCOSE 182 (H) 04/28/2017 1521   BUN 12 04/28/2017 1521   CREATININE 0.68 04/28/2017 1521   CALCIUM 9.4 04/28/2017 1521   PROT 7.8 04/28/2017 1521   ALBUMIN 4.2 04/28/2017 1521   AST 28 04/28/2017 1521   ALT 31 04/28/2017 1521   ALKPHOS 125 04/28/2017 1521   BILITOT 0.3 04/28/2017 1521   GFRNONAA >60 04/28/2017 1521   GFRAA >60 04/28/2017 1521    ASSESSMENT AND PLAN  51 y.o. year old female  has a past medical history of Anxiety; Anxiety disorder; Asthma; Chronic pain syndrome; COPD (chronic obstructive pulmonary disease) ([place]); Depression; ; Fatigue (03/29/2013); Fibromyalgia; GERD (gastroesophageal reflux disease); Hypertension; and Shortness of breath dyspnea. here to follow-up for obstructive sleep apneas with BiPAP compliance.Data dated 01/27/2017 through 04/26/2017 shows greater than 4 hours at 77%.  Average usage 5 hours 53 minutes pressure 9-13 cm AHI 16.2.  Patient was seen in the ER on 10/15 2018 after being bitten on the right ear by a horse with right ear avulsion partial.  She had debridement and closure in the emergency room and is now seeing a Psychiatric nurse.  She will be having reconstruction.   Compliance 77%  Will increase settings slightly due to AHI 16.7 Follow-up in 3 months for compliance with Dr. Wyvonne Lenz, [facility], [facility], [facility] [address] [place], [place] [address] [phone]  I reviewed the above note and documentation by the Nurse Practitioner and agree with the history, physical exam, assessment and plan as outlined above. I was immediately available for face-to-face consultation. Star Age, MD, PhD [facility])

## 2017-06-12 ENCOUNTER — Encounter: Payer: Self-pay | Admitting: Nurse Practitioner

## 2017-06-12 ENCOUNTER — Ambulatory Visit: Payer: Medicaid Other | Admitting: Nurse Practitioner

## 2017-06-12 VITALS — BP 113/78 | HR 85 | Ht 64.0 in | Wt 165.6 lb

## 2017-06-12 DIAGNOSIS — G4733 Obstructive sleep apnea (adult) (pediatric): Secondary | ICD-10-CM

## 2017-06-12 NOTE — Patient Instructions (Signed)
Compliance 77%  Will increase settings slightly due to AHI 16.7 Follow-up in 3 months for compliance with Dr. Rexene Alberts

## 2017-06-19 ENCOUNTER — Ambulatory Visit: Payer: Self-pay

## 2017-07-16 ENCOUNTER — Other Ambulatory Visit (INDEPENDENT_AMBULATORY_CARE_PROVIDER_SITE_OTHER): Payer: Self-pay | Admitting: Internal Medicine

## 2017-08-20 ENCOUNTER — Other Ambulatory Visit (HOSPITAL_COMMUNITY): Payer: Self-pay | Admitting: Internal Medicine

## 2017-08-20 DIAGNOSIS — Z1231 Encounter for screening mammogram for malignant neoplasm of breast: Secondary | ICD-10-CM
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
## 2017-09-04 ENCOUNTER — Encounter (HOSPITAL_BASED_OUTPATIENT_CLINIC_OR_DEPARTMENT_OTHER): Payer: Self-pay | Admitting: *Deleted

## 2017-09-04 ENCOUNTER — Other Ambulatory Visit: Payer: Self-pay

## 2017-09-05 ENCOUNTER — Encounter (HOSPITAL_BASED_OUTPATIENT_CLINIC_OR_DEPARTMENT_OTHER)
Admission: RE | Admit: 2017-09-05 | Discharge: 2017-09-05 | Disposition: A | Discharge status: Home / Self Care | Payer: Medicaid Other | Source: Ambulatory Visit | Attending: Plastic Surgery | Admitting: Plastic Surgery

## 2017-09-05 DIAGNOSIS — Z01812 Encounter for preprocedural laboratory examination: Secondary | ICD-10-CM | POA: Insufficient documentation

## 2017-09-05 LAB — BASIC METABOLIC PANEL
ANION GAP: 12 (ref 5–15)
BUN: 10 mg/dL (ref 6–20)
CO2: 24 mmol/L (ref 22–32)
Calcium: 9.5 mg/dL (ref 8.9–10.3)
Chloride: 103 mmol/L (ref 101–111)
Creatinine, Ser: 0.68 mg/dL (ref 0.44–1.00)
GFR calc Af Amer: >60 mL/min (ref >60)
GFR calc non Af Amer: >60 mL/min (ref >60)
GLUCOSE: 136 mg/dL — AB (ref 65–99)
POTASSIUM: 3.8 mmol/L (ref 3.5–5.1)
Sodium: 139 mmol/L (ref 135–145)

## 2017-09-09 ENCOUNTER — Ambulatory Visit (HOSPITAL_BASED_OUTPATIENT_CLINIC_OR_DEPARTMENT_OTHER): Payer: Medicaid Other | Admitting: Anesthesiology

## 2017-09-09 ENCOUNTER — Encounter (HOSPITAL_BASED_OUTPATIENT_CLINIC_OR_DEPARTMENT_OTHER): Payer: Self-pay | Admitting: Anesthesiology

## 2017-09-09 ENCOUNTER — Encounter (HOSPITAL_BASED_OUTPATIENT_CLINIC_OR_DEPARTMENT_OTHER)
Admission: RE | Disposition: A | Discharge status: Home / Self Care | Payer: Self-pay | Source: Ambulatory Visit | Attending: Plastic Surgery

## 2017-09-09 ENCOUNTER — Ambulatory Visit (HOSPITAL_BASED_OUTPATIENT_CLINIC_OR_DEPARTMENT_OTHER)
Admission: RE | Admit: 2017-09-09 | Discharge: 2017-09-09 | Disposition: A | Discharge status: Home / Self Care | Payer: Medicaid Other | Source: Ambulatory Visit | Attending: Plastic Surgery | Admitting: Plastic Surgery

## 2017-09-09 DIAGNOSIS — K219 Gastro-esophageal reflux disease without esophagitis: Secondary | ICD-10-CM | POA: Insufficient documentation

## 2017-09-09 DIAGNOSIS — Z87891 Personal history of nicotine dependence: Secondary | ICD-10-CM | POA: Insufficient documentation

## 2017-09-09 DIAGNOSIS — W5511XS Bitten by horse, sequela: Secondary | ICD-10-CM | POA: Diagnosis not present

## 2017-09-09 DIAGNOSIS — Z79899 Other long term (current) drug therapy: Secondary | ICD-10-CM | POA: Diagnosis not present

## 2017-09-09 DIAGNOSIS — F419 Anxiety disorder, unspecified: Secondary | ICD-10-CM | POA: Diagnosis not present

## 2017-09-09 DIAGNOSIS — G4733 Obstructive sleep apnea (adult) (pediatric): Secondary | ICD-10-CM | POA: Insufficient documentation

## 2017-09-09 DIAGNOSIS — M797 Fibromyalgia: Secondary | ICD-10-CM | POA: Diagnosis not present

## 2017-09-09 DIAGNOSIS — J449 Chronic obstructive pulmonary disease, unspecified: Secondary | ICD-10-CM | POA: Diagnosis not present

## 2017-09-09 DIAGNOSIS — I10 Essential (primary) hypertension: Secondary | ICD-10-CM | POA: Insufficient documentation

## 2017-09-09 DIAGNOSIS — Z981 Arthrodesis status: Secondary | ICD-10-CM | POA: Insufficient documentation

## 2017-09-09 DIAGNOSIS — S0991XS Unspecified injury of ear, sequela: Secondary | ICD-10-CM | POA: Diagnosis present

## 2017-09-09 DIAGNOSIS — G894 Chronic pain syndrome: Secondary | ICD-10-CM | POA: Insufficient documentation

## 2017-09-09 HISTORY — PX: OTOPLASATY: SHX1485

## 2017-09-09 SURGERY — RECONSTRUCTION, EAR
Anesthesia: General | Site: Ear | Laterality: Right

## 2017-09-09 MED ORDER — OXYCODONE HCL 5 MG/5ML PO SOLN: 5.0000 mg | Freq: Once | ORAL | Status: AC | PRN

## 2017-09-09 MED ORDER — LIDOCAINE 2% (20 MG/ML) 5 ML SYRINGE
INTRAMUSCULAR | Status: AC
  Filled 2017-09-09: qty 5

## 2017-09-09 MED ORDER — OXYCODONE HCL 5 MG PO TABS
5.0000 mg | ORAL_TABLET | Freq: Once | ORAL | Status: AC | PRN
  Administered 2017-09-09: 5 mg via ORAL

## 2017-09-09 MED ORDER — CEFAZOLIN SODIUM-DEXTROSE 2-4 GM/100ML-% IV SOLN
INTRAVENOUS | Status: AC
  Filled 2017-09-09: qty 100

## 2017-09-09 MED ORDER — SCOPOLAMINE 1 MG/3DAYS TD PT72: 1.0000 | MEDICATED_PATCH | Freq: Once | TRANSDERMAL | Status: DC | PRN

## 2017-09-09 MED ORDER — PHENYLEPHRINE 40 MCG/ML (10ML) SYRINGE FOR IV PUSH (FOR BLOOD PRESSURE SUPPORT)
PREFILLED_SYRINGE | INTRAVENOUS | Status: AC
  Filled 2017-09-09: qty 10

## 2017-09-09 MED ORDER — EPHEDRINE SULFATE 50 MG/ML IJ SOLN
INTRAMUSCULAR | Status: DC | PRN
  Administered 2017-09-09: 10 mg via INTRAVENOUS

## 2017-09-09 MED ORDER — PHENYLEPHRINE HCL 10 MG/ML IJ SOLN
INTRAMUSCULAR | Status: DC | PRN
  Administered 2017-09-09: 80 ug via INTRAVENOUS

## 2017-09-09 MED ORDER — LIDOCAINE HCL (CARDIAC) 20 MG/ML IV SOLN
INTRAVENOUS | Status: DC | PRN
  Administered 2017-09-09: 30 mg via INTRAVENOUS

## 2017-09-09 MED ORDER — FENTANYL CITRATE (PF) 100 MCG/2ML IJ SOLN
INTRAMUSCULAR | Status: AC
  Filled 2017-09-09: qty 2

## 2017-09-09 MED ORDER — DEXMEDETOMIDINE HCL 200 MCG/2ML IV SOLN: INTRAVENOUS | Status: DC | PRN

## 2017-09-09 MED ORDER — LACTATED RINGERS IV SOLN
INTRAVENOUS | Status: DC
  Administered 2017-09-09 (×2): via INTRAVENOUS

## 2017-09-09 MED ORDER — EPHEDRINE 5 MG/ML INJ
INTRAVENOUS | Status: AC
  Filled 2017-09-09: qty 10

## 2017-09-09 MED ORDER — CEFAZOLIN SODIUM-DEXTROSE 2-4 GM/100ML-% IV SOLN: 2.0000 g | INTRAVENOUS | Status: DC

## 2017-09-09 MED ORDER — ARTIFICIAL TEARS OPHTHALMIC OINT
TOPICAL_OINTMENT | OPHTHALMIC | Status: AC
  Filled 2017-09-09: qty 3.5

## 2017-09-09 MED ORDER — DEXAMETHASONE SODIUM PHOSPHATE 4 MG/ML IJ SOLN
INTRAMUSCULAR | Status: DC | PRN
  Administered 2017-09-09: 10 mg via INTRAVENOUS

## 2017-09-09 MED ORDER — DEXMEDETOMIDINE HCL 200 MCG/2ML IV SOLN
INTRAVENOUS | Status: DC | PRN
  Administered 2017-09-09 (×2): 4 ug via INTRAVENOUS

## 2017-09-09 MED ORDER — FENTANYL CITRATE (PF) 100 MCG/2ML IJ SOLN
INTRAMUSCULAR | Status: DC | PRN
  Administered 2017-09-09 (×2): 25 ug via INTRAVENOUS
  Administered 2017-09-09: 100 ug via INTRAVENOUS
  Administered 2017-09-09 (×2): 25 ug via INTRAVENOUS

## 2017-09-09 MED ORDER — BACITRACIN ZINC 500 UNIT/GM EX OINT
TOPICAL_OINTMENT | CUTANEOUS | Status: DC | PRN
  Administered 2017-09-09: 1 via TOPICAL

## 2017-09-09 MED ORDER — BUPIVACAINE-EPINEPHRINE 0.25% -1:200000 IJ SOLN
INTRAMUSCULAR | Status: DC | PRN
  Administered 2017-09-09: 10 mL

## 2017-09-09 MED ORDER — MIDAZOLAM HCL 2 MG/2ML IJ SOLN
INTRAMUSCULAR | Status: AC
  Filled 2017-09-09: qty 2

## 2017-09-09 MED ORDER — FENTANYL CITRATE (PF) 100 MCG/2ML IJ SOLN: 50.0000 ug | INTRAMUSCULAR | Status: DC | PRN

## 2017-09-09 MED ORDER — DEXAMETHASONE SODIUM PHOSPHATE 10 MG/ML IJ SOLN
INTRAMUSCULAR | Status: AC
  Filled 2017-09-09: qty 1

## 2017-09-09 MED ORDER — PROPOFOL 500 MG/50ML IV EMUL
INTRAVENOUS | Status: AC
  Filled 2017-09-09: qty 50

## 2017-09-09 MED ORDER — MIDAZOLAM HCL 5 MG/5ML IJ SOLN
INTRAMUSCULAR | Status: DC | PRN
  Administered 2017-09-09: 2 mg via INTRAVENOUS

## 2017-09-09 MED ORDER — MIDAZOLAM HCL 2 MG/2ML IJ SOLN: 1.0000 mg | INTRAMUSCULAR | Status: DC | PRN

## 2017-09-09 MED ORDER — DEXMEDETOMIDINE HCL IN NACL 200 MCG/50ML IV SOLN
INTRAVENOUS | Status: AC
  Filled 2017-09-09: qty 50

## 2017-09-09 MED ORDER — ONDANSETRON HCL 4 MG/2ML IJ SOLN: 4.0000 mg | Freq: Once | INTRAMUSCULAR | Status: DC | PRN

## 2017-09-09 MED ORDER — OXYCODONE HCL 5 MG PO TABS
ORAL_TABLET | ORAL | Status: AC
  Filled 2017-09-09: qty 1

## 2017-09-09 MED ORDER — ONDANSETRON HCL 4 MG/2ML IJ SOLN
INTRAMUSCULAR | Status: DC | PRN
  Administered 2017-09-09: 4 mg via INTRAVENOUS

## 2017-09-09 MED ORDER — CEFAZOLIN SODIUM-DEXTROSE 2-3 GM-%(50ML) IV SOLR
INTRAVENOUS | Status: DC | PRN
  Administered 2017-09-09: 2 g via INTRAVENOUS

## 2017-09-09 MED ORDER — SUCCINYLCHOLINE CHLORIDE 20 MG/ML IJ SOLN
INTRAMUSCULAR | Status: DC | PRN
  Administered 2017-09-09: 50 mg via INTRAVENOUS

## 2017-09-09 MED ORDER — PROPOFOL 10 MG/ML IV BOLUS
INTRAVENOUS | Status: DC | PRN
  Administered 2017-09-09: 150 mg via INTRAVENOUS

## 2017-09-09 MED ORDER — ONDANSETRON HCL 4 MG/2ML IJ SOLN
INTRAMUSCULAR | Status: AC
  Filled 2017-09-09: qty 2

## 2017-09-09 MED ORDER — FENTANYL CITRATE (PF) 100 MCG/2ML IJ SOLN
25.0000 ug | INTRAMUSCULAR | Status: DC | PRN
  Administered 2017-09-09 (×2): 50 ug via INTRAVENOUS

## 2017-09-09 SURGICAL SUPPLY — 58 items
ADH SKN CLS APL DERMABOND .7 (GAUZE/BANDAGES/DRESSINGS)
BALL CTTN LRG ABS STRL LF (GAUZE/BANDAGES/DRESSINGS)
BLADE CLIPPER SURG (BLADE) IMPLANT
BLADE SURG 15 STRL LF DISP TIS (BLADE) IMPLANT
BLADE SURG 15 STRL SS (BLADE) ×6
CANISTER SUCT 1200ML W/VALVE (MISCELLANEOUS) ×3 IMPLANT
CLOSURE WOUND 1/2 X4 (GAUZE/BANDAGES/DRESSINGS) ×1
CLOSURE WOUND 1/4X4 (GAUZE/BANDAGES/DRESSINGS)
COTTONBALL LRG STERILE PKG (GAUZE/BANDAGES/DRESSINGS) IMPLANT
DECANTER SPIKE VIAL GLASS SM (MISCELLANEOUS) IMPLANT
DERMABOND ADVANCED (GAUZE/BANDAGES/DRESSINGS)
DERMABOND ADVANCED .7 DNX12 (GAUZE/BANDAGES/DRESSINGS) IMPLANT
DRAPE U-SHAPE 76X120 STRL (DRAPES) ×2 IMPLANT
ELECT COATED BLADE 2.86 ST (ELECTRODE) ×2 IMPLANT
ELECT NDL BLADE 2-5/6 (NEEDLE) IMPLANT
ELECT NEEDLE BLADE 2-5/6 (NEEDLE) ×3 IMPLANT
ELECT REM PT RETURN 9FT ADLT (ELECTROSURGICAL) ×3
ELECTRODE REM PT RTRN 9FT ADLT (ELECTROSURGICAL) ×1 IMPLANT
GAUZE XEROFORM 1X8 LF (GAUZE/BANDAGES/DRESSINGS) ×2 IMPLANT
GAUZE XEROFORM 5X9 LF (GAUZE/BANDAGES/DRESSINGS) ×2 IMPLANT
GLOVE BIO SURGEON STRL SZ 6 (GLOVE) ×3 IMPLANT
GLOVE BIO SURGEON STRL SZ 6.5 (GLOVE) IMPLANT
GLOVE BIO SURGEONS STRL SZ 6.5 (GLOVE)
GLOVE BIOGEL PI IND STRL 7.0 (GLOVE) IMPLANT
GLOVE BIOGEL PI INDICATOR 7.0 (GLOVE) ×6
GLOVE ECLIPSE 6.5 STRL STRAW (GLOVE) ×4 IMPLANT
GOWN STRL REUS W/ TWL LRG LVL3 (GOWN DISPOSABLE) ×2 IMPLANT
GOWN STRL REUS W/TWL LRG LVL3 (GOWN DISPOSABLE) ×9
NDL FILTER BLUNT 18X1 1/2 (NEEDLE) IMPLANT
NDL PRECISIONGLIDE 27X1.5 (NEEDLE) ×1 IMPLANT
NEEDLE FILTER BLUNT 18X 1/2SAF (NEEDLE) ×2
NEEDLE FILTER BLUNT 18X1 1/2 (NEEDLE) ×1 IMPLANT
NEEDLE PRECISIONGLIDE 27X1.5 (NEEDLE) ×3 IMPLANT
PACK BASIN DAY SURGERY FS (CUSTOM PROCEDURE TRAY) ×3 IMPLANT
PACK ENT DAY SURGERY (CUSTOM PROCEDURE TRAY) ×3 IMPLANT
PENCIL BUTTON HOLSTER BLD 10FT (ELECTRODE) ×3 IMPLANT
SLEEVE SCD COMPRESS KNEE MED (MISCELLANEOUS) ×3 IMPLANT
SPONGE GAUZE 2X2 8PLY STER LF (GAUZE/BANDAGES/DRESSINGS)
SPONGE GAUZE 2X2 8PLY STRL LF (GAUZE/BANDAGES/DRESSINGS) IMPLANT
SPONGE LAP 18X18 RF (DISPOSABLE) ×2 IMPLANT
STAPLER VISISTAT 35W (STAPLE) ×2 IMPLANT
STRIP CLOSURE SKIN 1/2X4 (GAUZE/BANDAGES/DRESSINGS) ×1 IMPLANT
STRIP CLOSURE SKIN 1/4X4 (GAUZE/BANDAGES/DRESSINGS) IMPLANT
SUCTION FRAZIER HANDLE 10FR (MISCELLANEOUS) ×2
SUCTION TUBE FRAZIER 10FR DISP (MISCELLANEOUS) IMPLANT
SUT MON AB 5-0 P3 18 (SUTURE) ×6 IMPLANT
SUT PLAIN 5 0 P 3 18 (SUTURE) ×6 IMPLANT
SUT PROLENE 2 0 SH DA (SUTURE) ×2 IMPLANT
SUT PROLENE 5 0 P 3 (SUTURE) IMPLANT
SUT PROLENE 5 0 PS 2 (SUTURE) IMPLANT
SUT PROLENE 6 0 P 1 18 (SUTURE) IMPLANT
SYR 20CC LL (SYRINGE) ×2 IMPLANT
SYR BULB 3OZ (MISCELLANEOUS) ×2 IMPLANT
SYR CONTROL 10ML LL (SYRINGE) ×2 IMPLANT
TOWEL OR 17X24 6PK STRL BLUE (TOWEL DISPOSABLE) ×5 IMPLANT
TRAY DSU PREP LF (CUSTOM PROCEDURE TRAY) ×3 IMPLANT
TUBE CONNECTING 20'X1/4 (TUBING) ×1
TUBE CONNECTING 20X1/4 (TUBING) ×1 IMPLANT

## 2017-09-09 NOTE — Op Note (Signed)
Operative Note   DATE OF OPERATION: 2.26.2019  LOCATION: [facility]-outpatient  SURGICAL DIVISION: Plastic Surgery  PREOPERATIVE DIAGNOSES:  1. History horse bite ear 2. Traumatic partial avulsion right ear  POSTOPERATIVE DIAGNOSES:  same  PROCEDURE:  1. Post auricular bipedicle flap to right helical rim 6 x 10 cm 2. Conchal cartilage graft from right ear for lobule reconstruction  SURGEON: Irene Limbo MD MBA  ASSISTANT: none  ANESTHESIA:  General.   EBL: 10 ml  COMPLICATIONS: None immediate.   INDICATIONS FOR PROCEDURE:  The patient, [name], is a 52 y.o. female born on 06/21/1966, is here for staged reconstruction right ear following horse bite. Plan first stage lobule reconstruction and post auricular flap for helical rim.   FINDINGS: Patient presents with absence helical rim and lobule.   DESCRIPTION OF PROCEDURE:  The patient's operative site was marked with the patient in the preoperative area. The patient was taken to the operating room. SCDs were placed and IV antibiotics were given. The patient's operative site was prepped and draped in a sterile fashion. A time out was performed and all information was confirmed to be correct.  Local anesthetic infiltrated to perform right ear block. Contralateral ear lobule used as template to right ear, Incision made in scar line at base of right ear and subcutaneous pocket dissected to dimensions of opposite lobule.    Markings then made on posterior ear and post auricular skin parralel to helical rim defect for elevation bipedicle flap. The posterior ear and post auricular skin incised and elevated in supra perichondiral and subcutaneous plane from hairline to helical rim. Conchal cartilage graft harvested from beneath elevated flap. Graft placed in subcutaneous pocket developed as noted above. This was secured to cartilage remnant at lobule base with 5-0 monocryl. The lobular incision closed with 5-0 monocryl in dermis  and 5-0 plain gut for skin closure. The post auricular flap was advanced anteriorly. The redraped flap closed with 5-0 monocryl in dermis and 5-0 plain gut for skin closure. Dimensions flap 6 x 10 cm. Xeroform bolster secured to post auricular sulcus with 2-0 prolene. Antibiotic ointment applied to incisions.  The patient was allowed to wake from anesthesia, extubated and taken to the recovery room in satisfactory condition.   SPECIMENS: none  DRAINS: none  Irene Limbo, MD [facility] Plastic & Reconstructive Surgery [phone], pin [phone]

## 2017-09-09 NOTE — Anesthesia Preprocedure Evaluation (Addendum)
Anesthesia Evaluation  Patient identified by MRN, date of birth, ID band Patient awake    Reviewed: Allergy & Precautions, NPO status , Patient's Chart, lab work & pertinent test results  Airway Mallampati: II  TM Distance: >3 FB Neck ROM: Full    Dental  (+) Teeth Intact, Dental Advisory Given   Pulmonary former smoker,   Good air movement bilaterally with end expiratory wheezes    + wheezing      Cardiovascular hypertension,  Rhythm:Regular Rate:Normal     Neuro/Psych    GI/Hepatic   Endo/Other    Renal/GU      Musculoskeletal   Abdominal   Peds  Hematology   Anesthesia Other Findings   Reproductive/Obstetrics                             Anesthesia Physical Anesthesia Plan  ASA: III  Anesthesia Plan: General   Post-op Pain Management:    Induction: Intravenous  PONV Risk Score and Plan: Ondansetron and Dexamethasone  Airway Management Planned: LMA  Additional Equipment:   Intra-op Plan:   Post-operative Plan:   Informed Consent: I have reviewed the patients History and Physical, chart, labs and discussed the procedure including the risks, benefits and alternatives for the proposed anesthesia with the patient or authorized representative who has indicated his/her understanding and acceptance.   Dental advisory given  Plan Discussed with: Anesthesiologist and CRNA  Anesthesia Plan Comments:         Anesthesia Quick Evaluation

## 2017-09-09 NOTE — Anesthesia Postprocedure Evaluation (Signed)
Anesthesia Post Note  Patient: [name]  Procedure(s) Performed: POST AURICULAR FLAP TO RIGHT EAR CARTILAGE GRAFT TO RIGHT EAR FROM RIGHT  EAR (Right Ear)     Patient location during evaluation: PACU Anesthesia Type: General Level of consciousness: awake and alert Pain management: pain level controlled Vital Signs Assessment: post-procedure vital signs reviewed and stable Respiratory status: spontaneous breathing, nonlabored ventilation, respiratory function stable and patient connected to nasal cannula oxygen Cardiovascular status: blood pressure returned to baseline and stable Postop Assessment: no apparent nausea or vomiting Anesthetic complications: no    Last Vitals:  Vitals:   09/09/17 1145 09/09/17 1248  BP: 111/71 115/68  Pulse: 67 60  Resp: 18 20  Temp:  37 C  SpO2: 99% 95%    Last Pain:  Vitals:   09/09/17 1248  TempSrc:   PainSc: 5                  Peter Daquila COKER

## 2017-09-09 NOTE — Interval H&P Note (Signed)
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 After consideration of risks, benefits and other options for treatment, the patient has consented to  Procedure(s): POST AURICULAR FLAP TO RIGHT EAR CARTILAGE GRAFT TO RIGHT EAR FROM RIGHT AND LEFT EAR (Right) as a surgical intervention .  The patient's history has been reviewed, patient examined, no change in status, stable for surgery.  I have reviewed the patient's chart and labs.  Questions were answered to the patient's satisfaction.     Johnnay Pleitez

## 2017-09-09 NOTE — Anesthesia Procedure Notes (Signed)
Procedure Name: Intubation Date/Time: 09/09/2017 8:50 AM Performed by: Marrianne Mood, CRNA Pre-anesthesia Checklist: Patient identified, Emergency Drugs available, Suction available, Patient being monitored and Timeout performed Patient Re-evaluated:Patient Re-evaluated prior to induction Oxygen Delivery Method: Circle system utilized Preoxygenation: Pre-oxygenation with 100% oxygen Induction Type: IV induction Ventilation: Mask ventilation without difficulty Laryngoscope Size: Miller and 2 Grade View: Grade II Tube type: Oral Number of attempts: 1 Airway Equipment and Method: Stylet and Oral airway Placement Confirmation: ETT inserted through vocal cords under direct vision,  positive ETCO2 and breath sounds checked- equal and bilateral Secured at: 20 cm Tube secured with: Tape Dental Injury: Teeth and Oropharynx as per pre-operative assessment

## 2017-09-09 NOTE — Transfer of Care (Signed)
Immediate Anesthesia Transfer of Care Note  Patient: [name]  Procedure(s) Performed: POST AURICULAR FLAP TO RIGHT EAR CARTILAGE GRAFT TO RIGHT EAR FROM RIGHT  EAR (Right Ear)  Patient Location: PACU  Anesthesia Type:General  Level of Consciousness: sedated  Airway & Oxygen Therapy: Patient Spontanous Breathing and Patient connected to face mask oxygen  Post-op Assessment: Report given to RN and Post -op Vital signs reviewed and stable  Post vital signs: Reviewed and stable  Last Vitals:  Vitals:   09/09/17 0800 09/09/17 1132  BP: 124/74   Pulse: 63   Temp: 36.6 C (P) 36.8 C  SpO2: 100%     Last Pain:  Vitals:   09/09/17 0800  TempSrc: Oral  PainSc: 0-No pain         Complications: No apparent anesthesia complications

## 2017-09-09 NOTE — Discharge Instructions (Signed)

## 2017-09-10 ENCOUNTER — Telehealth: Payer: Self-pay | Admitting: Nurse Practitioner

## 2017-09-10 ENCOUNTER — Encounter (HOSPITAL_BASED_OUTPATIENT_CLINIC_OR_DEPARTMENT_OTHER): Payer: Self-pay | Admitting: Plastic Surgery

## 2017-09-10 NOTE — Telephone Encounter (Signed)
Noted  

## 2017-09-10 NOTE — Telephone Encounter (Signed)
Pt called stating she had ear surgery recently and isnt using the cpap as of right now but as soon as she's able will start using again

## 2017-09-15 ENCOUNTER — Ambulatory Visit: Payer: Medicaid Other | Admitting: Neurology
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
## 2017-09-30 ENCOUNTER — Other Ambulatory Visit: Payer: Self-pay

## 2017-09-30 ENCOUNTER — Encounter (HOSPITAL_BASED_OUTPATIENT_CLINIC_OR_DEPARTMENT_OTHER): Payer: Self-pay | Admitting: *Deleted

## 2017-10-03 ENCOUNTER — Encounter (HOSPITAL_BASED_OUTPATIENT_CLINIC_OR_DEPARTMENT_OTHER)
Admission: RE | Disposition: A | Discharge status: Home / Self Care | Payer: Self-pay | Source: Ambulatory Visit | Attending: Plastic Surgery

## 2017-10-03 ENCOUNTER — Ambulatory Visit (HOSPITAL_BASED_OUTPATIENT_CLINIC_OR_DEPARTMENT_OTHER)
Admission: RE | Admit: 2017-10-03 | Discharge: 2017-10-03 | Disposition: A | Discharge status: Home / Self Care | Payer: Medicaid Other | Source: Ambulatory Visit | Attending: Plastic Surgery | Admitting: Plastic Surgery

## 2017-10-03 ENCOUNTER — Other Ambulatory Visit: Payer: Self-pay

## 2017-10-03 ENCOUNTER — Encounter (HOSPITAL_BASED_OUTPATIENT_CLINIC_OR_DEPARTMENT_OTHER): Payer: Self-pay

## 2017-10-03 ENCOUNTER — Ambulatory Visit (HOSPITAL_BASED_OUTPATIENT_CLINIC_OR_DEPARTMENT_OTHER): Payer: Medicaid Other | Admitting: Anesthesiology

## 2017-10-03 DIAGNOSIS — G4733 Obstructive sleep apnea (adult) (pediatric): Secondary | ICD-10-CM | POA: Diagnosis not present

## 2017-10-03 DIAGNOSIS — J449 Chronic obstructive pulmonary disease, unspecified: Secondary | ICD-10-CM | POA: Diagnosis not present

## 2017-10-03 DIAGNOSIS — S01351A Open bite of right ear, initial encounter: Secondary | ICD-10-CM | POA: Diagnosis not present

## 2017-10-03 DIAGNOSIS — W5511XA Bitten by horse, initial encounter: Secondary | ICD-10-CM | POA: Diagnosis not present

## 2017-10-03 DIAGNOSIS — K219 Gastro-esophageal reflux disease without esophagitis: Secondary | ICD-10-CM | POA: Insufficient documentation

## 2017-10-03 DIAGNOSIS — Z87891 Personal history of nicotine dependence: Secondary | ICD-10-CM | POA: Insufficient documentation

## 2017-10-03 DIAGNOSIS — Z9989 Dependence on other enabling machines and devices: Secondary | ICD-10-CM | POA: Diagnosis not present

## 2017-10-03 HISTORY — PX: OTOPLASATY: SHX1485

## 2017-10-03 HISTORY — DX: Sleep apnea, unspecified: G47.30

## 2017-10-03 SURGERY — RECONSTRUCTION, EAR
Anesthesia: General | Site: Ear | Laterality: Right

## 2017-10-03 MED ORDER — HYDROMORPHONE HCL 1 MG/ML IJ SOLN
INTRAMUSCULAR | Status: AC
  Filled 2017-10-03: qty 0.5

## 2017-10-03 MED ORDER — MIDAZOLAM HCL 2 MG/2ML IJ SOLN
INTRAMUSCULAR | Status: AC
  Filled 2017-10-03: qty 2

## 2017-10-03 MED ORDER — BACITRACIN ZINC 500 UNIT/GM EX OINT
TOPICAL_OINTMENT | CUTANEOUS | Status: DC | PRN
  Administered 2017-10-03: 1 via TOPICAL

## 2017-10-03 MED ORDER — DEXAMETHASONE SODIUM PHOSPHATE 4 MG/ML IJ SOLN
INTRAMUSCULAR | Status: DC | PRN
  Administered 2017-10-03: 10 mg via INTRAVENOUS

## 2017-10-03 MED ORDER — PROPOFOL 10 MG/ML IV BOLUS
INTRAVENOUS | Status: DC | PRN
  Administered 2017-10-03: 150 mg via INTRAVENOUS

## 2017-10-03 MED ORDER — OXYCODONE HCL 5 MG PO TABS
5.0000 mg | ORAL_TABLET | Freq: Once | ORAL | Status: AC | PRN
  Administered 2017-10-03: 5 mg via ORAL

## 2017-10-03 MED ORDER — ROCURONIUM BROMIDE 100 MG/10ML IV SOLN
INTRAVENOUS | Status: DC | PRN
  Administered 2017-10-03 (×2): 50 mg via INTRAVENOUS

## 2017-10-03 MED ORDER — PROPOFOL 10 MG/ML IV BOLUS
INTRAVENOUS | Status: AC
  Filled 2017-10-03: qty 20

## 2017-10-03 MED ORDER — NEOSTIGMINE METHYLSULFATE 10 MG/10ML IV SOLN
INTRAVENOUS | Status: DC | PRN
  Administered 2017-10-03: 4 mg via INTRAVENOUS

## 2017-10-03 MED ORDER — HYDROMORPHONE HCL 1 MG/ML IJ SOLN
1.0000 mg | INTRAMUSCULAR | Status: DC | PRN
  Administered 2017-10-03: 0.5 mg via INTRAVENOUS

## 2017-10-03 MED ORDER — CEFAZOLIN SODIUM-DEXTROSE 2-4 GM/100ML-% IV SOLN
2.0000 g | INTRAVENOUS | Status: AC
  Administered 2017-10-03: 2 g via INTRAVENOUS

## 2017-10-03 MED ORDER — ONDANSETRON HCL 4 MG/2ML IJ SOLN
INTRAMUSCULAR | Status: DC | PRN
  Administered 2017-10-03: 4 mg via INTRAVENOUS

## 2017-10-03 MED ORDER — MIDAZOLAM HCL 2 MG/2ML IJ SOLN
1.0000 mg | INTRAMUSCULAR | Status: DC | PRN
  Administered 2017-10-03: 2 mg via INTRAVENOUS

## 2017-10-03 MED ORDER — BACITRACIN ZINC 500 UNIT/GM EX OINT
TOPICAL_OINTMENT | CUTANEOUS | Status: AC
  Filled 2017-10-03: qty 28.35

## 2017-10-03 MED ORDER — SCOPOLAMINE 1 MG/3DAYS TD PT72: 1.0000 | MEDICATED_PATCH | Freq: Once | TRANSDERMAL | Status: DC | PRN

## 2017-10-03 MED ORDER — OXYCODONE HCL 5 MG PO TABS
ORAL_TABLET | ORAL | Status: AC
  Filled 2017-10-03: qty 1

## 2017-10-03 MED ORDER — BUPIVACAINE-EPINEPHRINE 0.25% -1:200000 IJ SOLN
INTRAMUSCULAR | Status: DC | PRN
  Administered 2017-10-03: 12 mL

## 2017-10-03 MED ORDER — HYDROMORPHONE HCL 1 MG/ML IJ SOLN
0.2500 mg | INTRAMUSCULAR | Status: DC | PRN
  Administered 2017-10-03: 0.25 mg via INTRAVENOUS
  Administered 2017-10-03: 0.5 mg via INTRAVENOUS
  Administered 2017-10-03: 0.25 mg via INTRAVENOUS

## 2017-10-03 MED ORDER — PROPOFOL 10 MG/ML IV BOLUS
INTRAVENOUS | Status: AC
Start: 2017-10-03 — End: ?
  Filled 2017-10-03: qty 20

## 2017-10-03 MED ORDER — GLYCOPYRROLATE 0.2 MG/ML IJ SOLN
INTRAMUSCULAR | Status: DC | PRN
  Administered 2017-10-03: 0.2 mg via INTRAVENOUS
  Administered 2017-10-03: 0.6 mg via INTRAVENOUS

## 2017-10-03 MED ORDER — CEFAZOLIN SODIUM-DEXTROSE 2-4 GM/100ML-% IV SOLN
INTRAVENOUS | Status: AC
  Filled 2017-10-03: qty 100

## 2017-10-03 MED ORDER — FENTANYL CITRATE (PF) 100 MCG/2ML IJ SOLN
50.0000 ug | INTRAMUSCULAR | Status: DC | PRN
  Administered 2017-10-03: 100 ug via INTRAVENOUS

## 2017-10-03 MED ORDER — LIDOCAINE HCL (CARDIAC) 20 MG/ML IV SOLN
INTRAVENOUS | Status: DC | PRN
  Administered 2017-10-03: 100 mg via INTRAVENOUS

## 2017-10-03 MED ORDER — OXYCODONE HCL 5 MG/5ML PO SOLN: 5.0000 mg | Freq: Once | ORAL | Status: AC | PRN

## 2017-10-03 MED ORDER — FENTANYL CITRATE (PF) 100 MCG/2ML IJ SOLN
INTRAMUSCULAR | Status: AC
Start: 2017-10-03 — End: ?
  Filled 2017-10-03: qty 2

## 2017-10-03 MED ORDER — LACTATED RINGERS IV SOLN
INTRAVENOUS | Status: DC
  Administered 2017-10-03 (×2): via INTRAVENOUS

## 2017-10-03 MED ORDER — ARTIFICIAL TEARS OPHTHALMIC OINT
TOPICAL_OINTMENT | OPHTHALMIC | Status: DC | PRN
  Administered 2017-10-03: 2 via OPHTHALMIC

## 2017-10-03 SURGICAL SUPPLY — 54 items
APL SKNCLS STERI-STRIP NONHPOA (GAUZE/BANDAGES/DRESSINGS)
BENZOIN TINCTURE PRP APPL 2/3 (GAUZE/BANDAGES/DRESSINGS) IMPLANT
BLADE CLIPPER SURG (BLADE) IMPLANT
BLADE SURG 15 STRL LF DISP TIS (BLADE) ×2 IMPLANT
BLADE SURG 15 STRL SS (BLADE) ×4
BNDG CONFORM 3 STRL LF (GAUZE/BANDAGES/DRESSINGS) IMPLANT
BRUSH SCRUB EZ PLAIN DRY (MISCELLANEOUS) IMPLANT
CLEANER CAUTERY TIP 5X5 PAD (MISCELLANEOUS) IMPLANT
CLOSURE WOUND 1/2 X4 (GAUZE/BANDAGES/DRESSINGS)
COVER BACK TABLE 60X90IN (DRAPES) ×4 IMPLANT
COVER MAYO STAND STRL (DRAPES) ×4 IMPLANT
DRAPE U-SHAPE 76X120 STRL (DRAPES) ×4 IMPLANT
ELECT COATED BLADE 2.86 ST (ELECTRODE) ×3 IMPLANT
ELECT NDL BLADE 2-5/6 (NEEDLE) ×1 IMPLANT
ELECT NEEDLE BLADE 2-5/6 (NEEDLE) ×4 IMPLANT
ELECT REM PT RETURN 9FT ADLT (ELECTROSURGICAL) ×4
ELECTRODE REM PT RTRN 9FT ADLT (ELECTROSURGICAL) ×1 IMPLANT
GAUZE SPONGE 4X4 12PLY STRL LF (GAUZE/BANDAGES/DRESSINGS) IMPLANT
GAUZE SPONGE 4X4 16PLY XRAY LF (GAUZE/BANDAGES/DRESSINGS) ×3 IMPLANT
GAUZE XEROFORM 1X8 LF (GAUZE/BANDAGES/DRESSINGS) ×3 IMPLANT
GAUZE XEROFORM 5X9 LF (GAUZE/BANDAGES/DRESSINGS) ×3 IMPLANT
GLOVE BIO SURGEON STRL SZ 6 (GLOVE) ×8 IMPLANT
GLOVE BIO SURGEON STRL SZ 6.5 (GLOVE) ×3 IMPLANT
GLOVE BIO SURGEONS STRL SZ 6.5 (GLOVE) ×1
GLOVE BIOGEL PI IND STRL 7.0 (GLOVE) ×2 IMPLANT
GLOVE BIOGEL PI INDICATOR 7.0 (GLOVE) ×4
GLOVE EXAM NITRILE PF LG BLUE (GLOVE) IMPLANT
GOWN STRL REUS W/ TWL LRG LVL3 (GOWN DISPOSABLE) ×5 IMPLANT
GOWN STRL REUS W/TWL LRG LVL3 (GOWN DISPOSABLE) ×12
MARKER SKIN DUAL TIP RULER LAB (MISCELLANEOUS) ×3 IMPLANT
NDL HYPO 30GX1 BEV (NEEDLE) IMPLANT
NDL PRECISIONGLIDE 27X1.5 (NEEDLE) ×1 IMPLANT
NEEDLE HYPO 30GX1 BEV (NEEDLE) IMPLANT
NEEDLE PRECISIONGLIDE 27X1.5 (NEEDLE) ×4 IMPLANT
NS IRRIG 1000ML POUR BTL (IV SOLUTION) ×3 IMPLANT
PACK BASIN DAY SURGERY FS (CUSTOM PROCEDURE TRAY) ×4 IMPLANT
PAD CLEANER CAUTERY TIP 5X5 (MISCELLANEOUS)
PENCIL BUTTON HOLSTER BLD 10FT (ELECTRODE) ×3 IMPLANT
SHEET MEDIUM DRAPE 40X70 STRL (DRAPES) IMPLANT
SPONGE GAUZE 2X2 8PLY STER LF (GAUZE/BANDAGES/DRESSINGS)
SPONGE GAUZE 2X2 8PLY STRL LF (GAUZE/BANDAGES/DRESSINGS) IMPLANT
SPONGE LAP 18X18 X RAY DECT (DISPOSABLE) ×3 IMPLANT
STAPLER VISISTAT 35W (STAPLE) ×3 IMPLANT
STRIP CLOSURE SKIN 1/2X4 (GAUZE/BANDAGES/DRESSINGS) IMPLANT
SUT CHROMIC 4 0 P 3 18 (SUTURE) ×3 IMPLANT
SUT MNCRL AB 4-0 PS2 18 (SUTURE) IMPLANT
SUT MON AB 5-0 P3 18 (SUTURE) ×12 IMPLANT
SUT PLAIN 5 0 P 3 18 (SUTURE) ×9 IMPLANT
SUT PROLENE 2 0 CT2 30 (SUTURE) ×3 IMPLANT
SUT PROLENE 6 0 P 1 18 (SUTURE) ×3 IMPLANT
SUT VICRYL 4-0 PS2 18IN ABS (SUTURE) IMPLANT
SYR BULB 3OZ (MISCELLANEOUS) IMPLANT
SYR CONTROL 10ML LL (SYRINGE) ×4 IMPLANT
TRAY DSU PREP LF (CUSTOM PROCEDURE TRAY) ×4 IMPLANT

## 2017-10-03 NOTE — Transfer of Care (Signed)
Immediate Anesthesia Transfer of Care Note  Patient: [name]  Procedure(s) Performed: ADJACENT TISSUE TRANSFER RIGHT EAR, CARTILAGE GRAFT FROM LEFT TO RIGHT EAR, FULL THICKNESS SKIN GRAFT FROM LEFT POSTAURICULAR SULCUS TO RIGHT EAR (Right Ear)  Patient Location: PACU  Anesthesia Type:General  Level of Consciousness: awake, alert  and oriented  Airway & Oxygen Therapy: Patient Spontanous Breathing and Patient connected to face mask oxygen  Post-op Assessment: Report given to RN and Post -op Vital signs reviewed and stable  Post vital signs: Reviewed and stable  Last Vitals:  Vitals Value Taken Time  BP 117/78 10/03/2017 11:06 AM  Temp    Pulse 82 10/03/2017 11:07 AM  Resp 11 10/03/2017 11:07 AM  SpO2 97 % 10/03/2017 11:07 AM  Vitals shown include unvalidated device data.  Last Pain:  Vitals:   10/03/17 0719  TempSrc: Oral         Complications: No apparent anesthesia complications

## 2017-10-03 NOTE — Anesthesia Procedure Notes (Signed)
Procedure Name: Intubation Performed by: Verita Lamb, CRNA Pre-anesthesia Checklist: Patient identified, Emergency Drugs available, Suction available, Patient being monitored and Timeout performed Patient Re-evaluated:Patient Re-evaluated prior to induction Oxygen Delivery Method: Circle system utilized Preoxygenation: Pre-oxygenation with 100% oxygen Induction Type: IV induction Ventilation: Mask ventilation without difficulty Laryngoscope Size: Mac and 3 Grade View: Grade I Tube type: Oral Number of attempts: 1 Airway Equipment and Method: Stylet Placement Confirmation: ETT inserted through vocal cords under direct vision,  CO2 detector,  positive ETCO2 and breath sounds checked- equal and bilateral Secured at: 22 cm Tube secured with: Tape Dental Injury: Teeth and Oropharynx as per pre-operative assessment

## 2017-10-03 NOTE — Discharge Instructions (Signed)

## 2017-10-03 NOTE — Anesthesia Preprocedure Evaluation (Signed)
Anesthesia Evaluation  Patient identified by MRN, date of birth, ID band Patient awake    Reviewed: Allergy & Precautions, NPO status , Patient's Chart, lab work & pertinent test results  Airway Mallampati: I  TM Distance: >3 FB Neck ROM: Full    Dental   Pulmonary sleep apnea , COPD, former smoker,    Pulmonary exam normal        Cardiovascular hypertension, Pt. on medications Normal cardiovascular exam     Neuro/Psych Anxiety Depression    GI/Hepatic GERD  Medicated and Controlled,  Endo/Other    Renal/GU      Musculoskeletal   Abdominal   Peds  Hematology   Anesthesia Other Findings   Reproductive/Obstetrics                             Anesthesia Physical Anesthesia Plan  ASA: III  Anesthesia Plan: General   Post-op Pain Management:    Induction: Intravenous  PONV Risk Score and Plan: 3 and Dexamethasone, Ondansetron and Midazolam  Airway Management Planned:   Additional Equipment:   Intra-op Plan:   Post-operative Plan: Extubation in OR  Informed Consent: I have reviewed the patients History and Physical, chart, labs and discussed the procedure including the risks, benefits and alternatives for the proposed anesthesia with the patient or authorized representative who has indicated his/her understanding and acceptance.     Plan Discussed with: CRNA and Surgeon  Anesthesia Plan Comments:         Anesthesia Quick Evaluation

## 2017-10-03 NOTE — Interval H&P Note (Signed)
History and Physical Interval Note:  10/03/2017 7:01 AM  [name]  has presented today for surgery, with the diagnosis of RIGHT EAR AVULSION WITH HISTORY OF HORSE BITE  The various methods of treatment have been discussed with the patient and family. After consideration of risks, benefits and other options for treatment, the patient has consented to  Procedure(s): ADJACENT TISSUE TRANSFER RIGHT EAR, POSSIBLE CARTILAGE GRAFT FROM LEFT TO RIGHT EAR, FULL THICKNESS SKIN GRAFT FROM LEFT POSTAURICULAR SULCUS TO RIGHT EAR (Right) as a surgical intervention .  The patient's history has been reviewed, patient examined, no change in status, stable for surgery.  I have reviewed the patient's chart and labs.  Questions were answered to the patient's satisfaction.     Lemoyne Nestor

## 2017-10-03 NOTE — Op Note (Signed)
First Assist Op Note:  I assisted the Surgeon(s) _Dr. Arnoldo Hooker Thimmappa_ on the procedure(s): _Right Ear reconstruction with adjacent tissue transfer right ear, cartilage graft form legt to right ear and full thickness skin graft from left postauricular sulcus to right ear__on Date ___3/22/19______  I provided my assistance on this case as follows:  I was present and acted as first Environmental consultant during this operation. I was present during the patient transport into the operative suite and assisted the OR staff with transferring and positioning of the patient. All extremities were checked and properly cushioned and safety straps in place. I was involved in the prepping and placement of sterile drapes. A time out was performed and all information confirmed to be correct.  I first assisted during the case including retraction for exposure and application of sterile dressings. I provided assistance with application of post operative garments/splinting and assisted with patient transfer back to the stretcher as needed.   Samanda Buske,PA-C Plastic Surgery [phone]

## 2017-10-03 NOTE — Op Note (Signed)
Operative Note   DATE OF OPERATION: 3.22.2019  LOCATION: [facility]-outpatient  SURGICAL DIVISION: Plastic Surgery  PREOPERATIVE DIAGNOSES:  1. History horse bite ear 2. Traumatic partial avulsion right ear  POSTOPERATIVE DIAGNOSES:  same  PROCEDURE:  1. Full thickness skin graft to right ear (posterior lobule reconstruction and posterior sulcus), total 8 cm2 2. Cartilage graft to right ear from left concha 3. Division and advancement right postauricular flap 4 cm2  SURGEON: Irene Limbo MD MBA  ASSISTANT: S. Rayburn PA-C  ANESTHESIA:  General.   EBL: 15 ml  COMPLICATIONS: None immediate.   INDICATIONS FOR PROCEDURE:  The patient, [name], is a 52 y.o. female born on Jun 11, 1966, is here for staged reconstruction right ear following partial avulsion from horse bite with absence majority helical rim and lobule. She has undergone cartilage graft to right lobule and bipedicle postauricular flap at previous stage.   FINDINGS: Pre and post auricular skin harvested from left ear to right posterior lobule reconstruction and right post auricular sulcus defect.   DESCRIPTION OF PROCEDURE:  The patient's operative site was marked with the patient in the preoperative area. The patient was taken to the operating room. SCDs were placed and IV antibiotics were given. The patient's operative site was prepped and draped in a sterile fashion. A time out was performed and all information was confirmed to be correct. Local anesthetic infiltrated to perform bilateral ear blocks. Additional local infiltrated in left preauricular skin and over conchal surface. Over right ear, lobule designed over cheek and postauricular skin, including area prior cartilage graft. Skin flap elevated in subcutaneous plane. Posterior flap defect approximately 2 x 2 cm. An elliptical skin excision of left preauricular skin completed. The graft was prepared by removed subcutaneous fat and then inset over posterior  lobule with 4-0 plain gut suture. The left preauricular donor site closed with 5-0 monocryl in dermis and 6-0 prolene skin closure. Incision made in right postauricular sulcus and post auricular skin flap elevated to helical rim.   Elliptical skin excision completed in left post auricular sulcus. Skin prepared and placed in saline for later grafting. Conchal cartilage harvested from left ear and placed in saline. Closure completed with 5-0 monocryl in dermis and running 5-0 plain gut for skin closure. I then returned to right ear. Conchal cartilage grafts trimmed and used to reconstruction helical rim. The grafts were sutured to anterior skin flap, remnant helical rim superiorly and inferiorly with 5-0 monocryl. The elevated soft tissue flap was then redraped over cartilage grafts and secured with 5-0 monocryl. Defect 4 x 1 cm remainder in post auricular sulcus The post auricular skin harvested from left ear was then inset as graft with 4-0 plain gut suture. Total area grafting 8 cm2.  Xeroform bolster secured to post auricular sulcus with 2-0 prolene. Antibiotic ointment applied to incisions.  The patient was allowed to wake from anesthesia, extubated and taken to the recovery room in satisfactory condition.   SPECIMENS: none  DRAINS: none  Irene Limbo, MD [facility] Plastic & Reconstructive Surgery [phone], pin [phone]

## 2017-10-06 ENCOUNTER — Encounter (HOSPITAL_BASED_OUTPATIENT_CLINIC_OR_DEPARTMENT_OTHER): Payer: Self-pay | Admitting: Plastic Surgery

## 2017-10-20 NOTE — Anesthesia Postprocedure Evaluation (Signed)
Anesthesia Post Note  Patient: Monserratt L Dillehay  Procedure(s) Performed: ADJACENT TISSUE TRANSFER RIGHT EAR, CARTILAGE GRAFT FROM LEFT TO RIGHT EAR, FULL THICKNESS SKIN GRAFT FROM LEFT POSTAURICULAR SULCUS TO RIGHT EAR (Right Ear)     Patient location during evaluation: PACU Anesthesia Type: General Level of consciousness: awake and sedated Pain management: pain level controlled Vital Signs Assessment: post-procedure vital signs reviewed and stable Respiratory status: spontaneous breathing, nonlabored ventilation, respiratory function stable and patient connected to nasal cannula oxygen Cardiovascular status: blood pressure returned to baseline and stable Postop Assessment: no apparent nausea or vomiting Anesthetic complications: no    Last Vitals:  Vitals:   10/03/17 1215 10/03/17 1242  BP: 108/70 109/64  Pulse: 73 69  Resp: 19 16  Temp:  36.8 C  SpO2: 95% 97%    Last Pain:  Vitals:   10/06/17 0852  TempSrc:   PainSc: 2                  Kirtan Sada,JAMES TERRILL

## 2017-10-28 NOTE — Telephone Encounter (Signed)
Pt called stating she has finally gotten clearance to use her CPAP machine again. Stating her first night using it again had been last night 4/15. Pt unsure if she needs to come in sooner than a 3 month f/u since she had to push it for surgery. Pt would like a call to discuss the time frame of scheduling a f/u

## 2017-10-28 NOTE — Telephone Encounter (Signed)
Spoke to pt and made appt per CM/NP note to see Dr. Rexene Alberts next for bipap.    01-08-18 at 1030 arrive 1000.  Pt verbalized understanding.   Airview not showing last nights compliance.  [facility] [employer] and pt may pick up card and check signal if still no compliance tomorrow).

## 2017-10-29 NOTE — Telephone Encounter (Signed)
Check in airview and not showing days used per pt. (last 2 days).  She will need to contact [employer].  Could not LM VM full.

## 2017-10-30 NOTE — Telephone Encounter (Signed)
I spoke to pt and relayed that I have not been able to see wireless connection on airview.  She will need to contact [facility] to get chip or other help if needed.  She verbalized understanding.

## 2018-01-08 ENCOUNTER — Ambulatory Visit: Payer: Self-pay | Admitting: Neurology

## 2018-02-18 ENCOUNTER — Encounter: Payer: Self-pay | Admitting: Neurology

## 2018-02-23 ENCOUNTER — Encounter: Payer: Self-pay | Admitting: Neurology

## 2018-02-23 ENCOUNTER — Ambulatory Visit: Payer: Medicaid Other | Admitting: Neurology

## 2018-02-23 VITALS — BP 127/80 | HR 89 | Ht 63.0 in | Wt 169.0 lb

## 2018-02-23 DIAGNOSIS — G4731 Primary central sleep apnea: Secondary | ICD-10-CM | POA: Diagnosis not present

## 2018-02-23 DIAGNOSIS — G4733 Obstructive sleep apnea (adult) (pediatric): Secondary | ICD-10-CM | POA: Diagnosis not present

## 2018-02-23 NOTE — Progress Notes (Signed)
Subjective:    Patient ID: [name] is a 52 y.o. female.  HPI     Interim history:   [name] is a 52 year old right-handed woman with an underlying medical history of asthma, COPD, hypertension, reflux disease, chronic pain on chronic narcotic pain medication, Hx of ear avulsion and s/p surgery, anxiety, depression, fibromyalgia, and overweight state, who presents for follow-up consultation of her sleep apnea. The patient is unaccompanied today. I first met her on 08/12/2016 at the request of her primary care physician, at which time she reported snoring and daytime somnolence with an Epworth sleepiness score of 17 at the time. She was advised to proceed with sleep study testing. She had a baseline sleep study, followed by a CPAP titration study.   She saw Christine Rubin, NP, in the interim on 06/12/2017, at which time she was able to be compliant with her BiPAP with a compliance percentage for more than 4 hours of 77%, her residual AHI was about 15 and her BiPAP pressure was increased at the time.  Today, 02/23/2018: I reviewed her BiPAP compliance data from 01/20/2018 through 02/18/2018 which is a total of 30 days, during which time she used her machine 29 days with percent used days greater than 4 hours at 93%, indicating excellent compliance with an average usage of 6 hours and 54 minutes, residual AHI suboptimal at 16 per hour secondary to primarily central events with a central apnea index of 10.9 per hour. Pressure at 14/10 cm, leak acceptable with the 95th percentile at 17 L/m. She reports tolerating BiPAP well. She uses a nasal mask. She had interim plastic surgery about 3 times altogether to her right ear. She has done fairly well. She reported sleeping well with BiPAP and has adapted well to treatment overall. Of note, she continues to take narcotic pain medication, oxycodone 10 mg strength 4 times a day on average.  The patient's allergies, current medications, family  history, past medical history, past social history, past surgical history and problem list were reviewed and updated as appropriate.   Previously:  08/12/2016: (She) reports snoring and excessive daytime somnolence. Snoring is reportedly loud at times and she has witnessed apneic pauses while asleep. I reviewed your office note from 06/12/2016, which you kindly included. She is on prn Valium. Her Epworth sleepiness score is 17 out of 24, her fatigue score is 29 out of 63. She denies falling asleep at the wheel. She does not like to nap, but may doze off easily when sedentary. She stopped the Celexa and remeron on her own. She is afraid of SEs.  She had back surgery on 09/22/14 and I reviewed the L spine Xray from 09/22/14: IMPRESSION: Intraoperative fluoroscopy utilized for surgical ventral purposes, demonstrating posterior fixation from T11 through L3 with spacer at the anteriorly compressed L1.   She had CT L spine on 08/14/14 and I reviewed the results: IMPRESSION: Further collapse of the chronic L1 compression fracture, now with 50% loss of vertebral body height. This can be seen in normal healing with resorbtion around the fracture planes but can also be associated with posttraumatic osteonecrosis of the vertebral body (Kummel disease).   Her bedtime is between 9 and 10 PM, wakeup time is 6:10 a.m. She lives with her boyfriend and 54-year-old daughter. They have 1 dog in the household, not in the bedroom typically. She does not watch TV in the bedroom. She has difficulty maintaining sleep and often wakes up around 3 AM and has difficulty  going back to sleep. She has significant nocturia, 3-4 times per average night. She denies morning headaches. She has gained weight. Her mother has sleep apnea and uses a CPAP machine. The patient quit smoking 5 years ago, she drinks alcohol a few times a week, typically no caffeine on a daily basis. She has some restlessness in her arms at times. She has some  residual weakness in her left leg. 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   Her Past Medical History Is Significant For: Past Medical History:  Diagnosis Date  . Anxiety   . Anxiety disorder   . Asthma   . Chronic pain syndrome   . COPD (chronic obstructive pulmonary disease) ([name])   . Depression   . Epigastric pain 03/29/2013  . Fatigue 03/29/2013  . Fibromyalgia   . GERD (gastroesophageal reflux disease)   . Hypertension   . Shortness of breath dyspnea   . Sleep apnea    not using Bipap right now due to ear wound    Her Past Surgical History Is Significant For: Past Surgical History:  Procedure Laterality Date  . BALLOON DILATION N/A 12/31/2013   Procedure: BALLOON DILATION;  Surgeon: Christine Houston, MD;  [facility];  Service: Endoscopy;  Laterality: N/A;  . CARPAL TUNNEL RELEASE    . CARPAL TUNNEL RELEASE  07/23/2012   Procedure: CARPAL TUNNEL RELEASE;  Surgeon: Christine Kava, MD;  Location: [facility];  Service: Orthopedics;  Laterality: Left;  . CESAREAN SECTION    . CHOLECYSTECTOMY    . cyst removed Left    had surgery to removed cyst  . ENDOMETRIAL ABLATION    . ESOPHAGOGASTRODUODENOSCOPY  07/31/2011   Procedure: ESOPHAGOGASTRODUODENOSCOPY (EGD);  Surgeon: Christine Houston, MD;  [facility];  Service: Endoscopy;  Laterality: N/A;  . ESOPHAGOGASTRODUODENOSCOPY N/A 12/31/2013   Procedure: ESOPHAGOGASTRODUODENOSCOPY (EGD);  Surgeon: Christine Houston, MD;  [facility];  Service: Endoscopy;  Laterality: N/A;  120-moved to 925 Ann to notify pt  . ESOPHAGOGASTRODUODENOSCOPY (EGD) WITH ESOPHAGEAL DILATION N/A 09/30/2012   Procedure: ESOPHAGOGASTRODUODENOSCOPY (EGD) WITH ESOPHAGEAL DILATION;  Surgeon: Christine Houston, MD;  [facility];  Service: Endoscopy;  Laterality: N/A;  245  . KNEE SURGERY Left   . MALONEY DILATION  07/31/2011   Procedure: MALONEY DILATION;  Surgeon: Christine Houston, MD;  [facility];  Service: Endoscopy;;  . [name] DILATION N/A 12/31/2013   Procedure: [name] DILATION;  Surgeon: Christine Houston, MD;  [facility];  Service: Endoscopy;  Laterality: N/A;  . MASS EXCISION  05/20/2012   Procedure: EXCISION MASS;  Surgeon: Christine Kava, MD;  Location: [facility];  Service: Orthopedics;  Laterality: Left;  Repair of Fascial Defect Left Knee  . OTOPLASATY Right 09/09/2017   Procedure: POST AURICULAR FLAP TO RIGHT EAR CARTILAGE GRAFT TO RIGHT EAR FROM RIGHT  EAR;  Surgeon: Irene Limbo, MD;  Location: [place];  Service: Plastics;  Laterality: Right;  . OTOPLASATY Right 10/03/2017   Procedure: ADJACENT TISSUE TRANSFER RIGHT EAR, CARTILAGE GRAFT FROM LEFT TO RIGHT EAR, FULL THICKNESS SKIN GRAFT FROM LEFT POSTAURICULAR SULCUS TO RIGHT EAR;  Surgeon: Irene Limbo, MD;  Location: [place];  Service: Plastics;  Laterality: Right;  . SAVORY DILATION N/A 12/31/2013   Procedure: SAVORY DILATION;  Surgeon: Christine Houston, MD;  [facility];  Service: Endoscopy;  Laterality: N/A;  . SPINE  SURGERY      Her Family History Is Significant For: Family History  Problem Relation Age of Onset  . Asthma Mother   . Asthma Father   . Cancer Father     Her Social History Is Significant For: Social History   Socioeconomic History  . Marital status: Single    Spouse name: Not on file  . Number of children: Not on file  . Years of education: Not on file  . Highest education level: Not on file  Occupational History  . Occupation: N/A  Social Needs  . Financial resource strain: Not on file  . Food insecurity:    Worry: Not on file    Inability: Not on file  . Transportation needs:    Medical: Not on file    Non-medical: Not on file  Tobacco Use  . Smoking status: Former Smoker    Types: Cigarettes    Last attempt to quit: 10/26/2011    Years since quitting: 6.3  . Smokeless tobacco: Never Used  Substance and Sexual  Activity  . Alcohol use: Yes    Alcohol/week: 2.0 standard drinks    Types: 2 Cans of beer per week    Comment: social  . Drug use: No  . Sexual activity: Yes    Birth control/protection: None, Surgical    Comment: ablation  Lifestyle  . Physical activity:    Days per week: Not on file    Minutes per session: Not on file  . Stress: Not on file  Relationships  . Social connections:    Talks on phone: Not on file    Gets together: Not on file    Attends religious service: Not on file    Active member of club or organization: Not on file    Attends meetings of clubs or organizations: Not on file    Relationship status: Not on file  Other Topics Concern  . Not on file  Social History Narrative   Denies any caffeine use     Her Allergies Are:  No Known Allergies:   Her Current Medications Are:  Outpatient Encounter Medications as of 02/23/2018  Medication Sig  . albuterol (PROVENTIL HFA;VENTOLIN HFA) 108 (90 BASE) MCG/ACT inhaler Inhale 2 puffs into the lungs every 6 (six) hours as needed for wheezing or shortness of breath. For shortness of breath  . albuterol (PROVENTIL) (2.5 MG/3ML) 0.083% nebulizer solution Take 2.5 mg by nebulization every 6 (six) hours as needed for wheezing or shortness of breath.   Marland Kitchen amLODipine (NORVASC) 10 MG tablet Take 20 mg by mouth daily.  . diazepam (VALIUM) 10 MG tablet Take 1 tablet (10 mg total) by mouth every 8 (eight) hours as needed for anxiety (also for muscle spasms).  Marland Kitchen diltiazem (CARDIZEM CD) 240 MG 24 hr capsule Take 240 mg by mouth daily.  Marland Kitchen diltiazem (CARDIZEM) 120 MG tablet Take 120 mg by mouth daily.  . furosemide (LASIX) 20 MG tablet Take 20 mg by mouth daily.   Marland Kitchen lisinopril (PRINIVIL,ZESTRIL) 20 MG tablet Take 1 tablet (20 mg total) by mouth daily.  . Omega-3 Fatty Acids (FISH OIL) 1200 MG CAPS Take 1,200 mg by mouth daily.   Marland Kitchen oxyCODONE-acetaminophen (PERCOCET) 10-325 MG per tablet Take 1-2 tablets by mouth every 4 (four) hours as  needed for pain.   . pantoprazole (PROTONIX) 40 MG tablet TAKE 1 TABLET BY MOUTH TWICE DAILY BEFORE A MEAL.  Marland Kitchen tiotropium (SPIRIVA) 18 MCG inhalation capsule Place 18 mcg into inhaler  and inhale daily.   No facility-administered encounter medications on file as of 02/23/2018.   :  Review of Systems:  Out of a complete 14 point review of systems, all are reviewed and negative with the exception of these symptoms as listed below: Review of Systems  Neurological:       Pt presents today to discuss her bipap. Pt reports that her bipap is going well.    Objective:  Neurological Exam  Physical Exam Physical Examination:   Vitals:   02/23/18 1356  BP: 127/80  Pulse: 89    General Examination: The patient is a very pleasant 52 y.o. female in no acute distress. She appears well-developed and well-nourished and well groomed.   HEENT: Normocephalic, pupils are equal, round and reactive to light and accommodation. Funduscopic exam is normal with sharp disc margins noted. Extraocular tracking is good without limitation to gaze excursion or nystagmus noted. Normal smooth pursuit is noted. Hearing is grossly intact. R ear s/p reconstructive surgery. Inconspicuous scar left ear from cartilage harvesting. Face is symmetric with normal facial animation and normal facial sensation. Speech is clear with no dysarthria noted. There is no hypophonia. There is no lip, neck/head, jaw or voice tremor. Neck is supple with full range of passive and active motion. Oropharynx exam reveals: mild mouth dryness, adequate dental hygiene and moderate airway crowding, due to smaller airway entry, tonsils in place, uvula thicker. Mallampati is class II. Tongue protrudes centrally and palate elevates symmetrically. Tonsils are 1+ bilaterally. Neck circumference is 15-1/2 inches. She has a Mild overbite.    Chest: Clear to auscultation without wheezing, rhonchi or crackles noted.  Heart: S1+S2+0, regular and normal  without murmurs, rubs or gallops noted.   Abdomen: Soft, non-tender and non-distended with normal bowel sounds appreciated on auscultation.  Extremities: There is trace pitting edema in the distal lower extremities bilaterally.   Skin: Warm and dry without trophic changes noted.   Musculoskeletal: exam reveals no obvious joint deformities, tenderness or joint swelling or erythema.   Neurologically:  Mental status: The patient is awake, alert and oriented in all 4 spheres. Her immediate and remote memory, attention, language skills and fund of knowledge are appropriate. There is no evidence of aphasia, agnosia, apraxia or anomia. Speech is clear with normal prosody and enunciation. Thought process is linear. Mood is normal and affect is normal.  Cranial nerves II - XII are as described above under HEENT exam.  Motor exam: Normal bulk, strength and tone is noted. There is no drift, tremor or rebound. Romberg is negative. Fine motor skills and coordination: grossly intact.  Cerebellar testing: No dysmetria or intention tremor.  Sensory exam: intact to light touch in the upper and lower extremities.  Gait, station and balance: She stands easily. No veering to one side is noted. No leaning to one side is noted. Posture is age-appropriate and stance is narrow based. Gait shows normal stride length and normal pace. No problems turning are noted. Tandem walk is initially difficult for her, but doable.               Assessment and Plan:  In summary, Oluwadara L Bleich is a very pleasant 52 year old female with an underlying medical history of asthma, COPD, hypertension, reflux disease, chronic pain syndrome on chronic narcotic pain medication, anxiety, depression, fibromyalgia, and overweight state, status post traumatic injury to the right ear with status post plastic surgery reconstructive surgeries, who presents for follow-up consultation of her mixed sleep apnea. She  has been on BiPAP of 14/10 cm  with excellent compliance. She indicates good results with BiPAP therapy. She is commended for her treatment adherence. Nevertheless, her residual AHI is suboptimal at 16 per hour for the past month. She has primarily central events currently, likely in part secondary to narcotic pain medications. She is advised that I would like to change her BiPAP to auto BiPAP currently. We will fax the request for change of pressure setting to her current DME company. She is advised to continue using her machine as well as she is and we will see her back routinely in follow-up for her compliance recheck in about 3 months with one of our nurse practitioners. If her machine is not capable of changing to auto BiPAP, I may increase her pressure from 14/10 cm to 16/12 for now.   I answered all her questions today and she was in agreement.  I spent 20 minutes in total face-to-face time with the patient, more than 50% of which was spent in counseling and coordination of care, reviewing test results, reviewing medication and discussing or reviewing the diagnosis of CSA, and OSA, the prognosis and treatment options. Pertinent laboratory and imaging test results that were available during this visit with the patient were reviewed by me and considered in my medical decision making (see chart for details).

## 2018-02-23 NOTE — Patient Instructions (Addendum)
As discussed, we will try to change your BiPAP setting to autoBiPAP to see if we can optimize your treatment some more.  If your machine is not capable of autoBiPAP, I may increase your pressure from 14/10 cm to 16/12 cm.  We will have you come back in 3 months to see Cecille Rubin, nurse practitioner. If you don't hear back from her DME company, [employer], within the next 2 weeks, let us know.

## 2018-02-24 ENCOUNTER — Telehealth: Payer: Self-pay | Admitting: Neurology

## 2018-02-24 DIAGNOSIS — G4731 Primary central sleep apnea: Secondary | ICD-10-CM

## 2018-02-24 DIAGNOSIS — G4733 Obstructive sleep apnea (adult) (pediatric): Secondary | ICD-10-CM

## 2018-02-24 NOTE — Telephone Encounter (Signed)
[name] from [place] called and stated that they received the order for the new auto bipap machine. Since her insurance just paid for one, they will not pay for another. She would like to advise our office that it will not be covered by the patients insurance. If you would like to send a new rx to increase pressure they can do that, but they cannot put her on an auto unit. No need to return call.

## 2018-02-24 NOTE — Telephone Encounter (Signed)
Order for bipap pressure increase sent to [facility]. Received a receipt of confirmation.

## 2018-02-24 NOTE — Addendum Note (Signed)
Addended by: Star Age on: 02/24/2018 03:47 PM   Modules accepted: Orders

## 2018-02-24 NOTE — Telephone Encounter (Signed)
She cannot have autobiPAP per DME. I will increase BiPAP pressure setting. We discussed this possibility at her office visit.

## 2018-04-02 ENCOUNTER — Ambulatory Visit: Payer: Medicaid Other | Admitting: Podiatry

## 2018-04-02 ENCOUNTER — Encounter: Payer: Self-pay | Admitting: Podiatry

## 2018-04-02 VITALS — BP 133/83 | HR 85

## 2018-04-02 DIAGNOSIS — D492 Neoplasm of unspecified behavior of bone, soft tissue, and skin: Secondary | ICD-10-CM | POA: Diagnosis not present

## 2018-04-02 DIAGNOSIS — Q828 Other specified congenital malformations of skin: Secondary | ICD-10-CM

## 2018-04-02 DIAGNOSIS — L989 Disorder of the skin and subcutaneous tissue, unspecified: Secondary | ICD-10-CM

## 2018-04-02 NOTE — Progress Notes (Signed)
Subjective:  Patient ID: [name], female    DOB: 08/28/1965,  MRN: [id number]  Chief Complaint  Patient presents with  . Plantar Warts    left foot ball of foot; pt stated, "have had it in the past, just came back up about 33mo ago, getting painful to walk on"    52 y.o. female presents with the above complaint.  Reports pain in the ball of the left foot.  States that she had a lesion like this in the past it just came up about 2 months ago.  States feeling painful hard to walk on.  Previously the area was shaved away and did not come back  Review of Systems: Negative except as noted in the HPI. Denies N/V/F/Ch.  Past Medical History:  Diagnosis Date  . Anxiety   . Anxiety disorder   . Asthma   . Chronic pain syndrome   . COPD (chronic obstructive pulmonary disease) ([place])   . Depression   . Epigastric pain 03/29/2013  . Fatigue 03/29/2013  . Fibromyalgia   . GERD (gastroesophageal reflux disease)   . Hypertension   . Shortness of breath dyspnea   . Sleep apnea    not using Bipap right now due to ear wound    Current Outpatient Medications:  .  albuterol (PROVENTIL HFA;VENTOLIN HFA) 108 (90 BASE) MCG/ACT inhaler, Inhale 2 puffs into the lungs every 6 (six) hours as needed for wheezing or shortness of breath. For shortness of breath, Disp: 1 Inhaler, Rfl: 1 .  albuterol (PROVENTIL) (2.5 MG/3ML) 0.083% nebulizer solution, Take 2.5 mg by nebulization every 6 (six) hours as needed for wheezing or shortness of breath. , Disp: , Rfl:  .  amLODipine (NORVASC) 10 MG tablet, Take 20 mg by mouth daily., Disp: , Rfl:  .  diazepam (VALIUM) 10 MG tablet, Take 1 tablet (10 mg total) by mouth every 8 (eight) hours as needed for anxiety (also for muscle spasms)., Disp: 45 tablet, Rfl: 0 .  diltiazem (CARDIZEM CD) 240 MG 24 hr capsule, Take 240 mg by mouth daily., Disp: , Rfl:  .  diltiazem (CARDIZEM) 120 MG tablet, Take 120 mg by mouth daily., Disp: , Rfl:  .  furosemide (LASIX) 20 MG  tablet, Take 20 mg by mouth daily. , Disp: , Rfl:  .  lisinopril (PRINIVIL,ZESTRIL) 20 MG tablet, Take 1 tablet (20 mg total) by mouth daily., Disp: 30 tablet, Rfl: 0 .  Omega-3 Fatty Acids (FISH OIL) 1200 MG CAPS, Take 1,200 mg by mouth daily. , Disp: , Rfl:  .  oxyCODONE-acetaminophen (PERCOCET) 10-325 MG per tablet, Take 1-2 tablets by mouth every 4 (four) hours as needed for pain. , Disp: , Rfl:  .  pantoprazole (PROTONIX) 40 MG tablet, TAKE 1 TABLET BY MOUTH TWICE DAILY BEFORE A MEAL., Disp: 60 tablet, Rfl: 5 .  tiotropium (SPIRIVA) 18 MCG inhalation capsule, Place 18 mcg into inhaler and inhale daily., Disp: , Rfl:   Social History   Tobacco Use  Smoking Status Former Smoker  . Types: Cigarettes  . Last attempt to quit: 10/26/2011  . Years since quitting: 6.4  Smokeless Tobacco Never Used    No Known Allergies Objective:   Vitals:   04/02/18 1032  BP: 133/83  Pulse: 85   There is no height or weight on file to calculate BMI. Constitutional Well developed. Well nourished.  Vascular Dorsalis pedis pulses palpable bilaterally. Posterior tibial pulses palpable bilaterally. Capillary refill normal to all digits.  No cyanosis or  clubbing noted. Pedal hair growth normal.  Neurologic Normal speech. Oriented to person, place, and time. Epicritic sensation to light touch grossly present bilaterally.  Dermatologic Nails well groomed and normal in appearance. No open wounds. Punctate hyperkeratosis nonweightbearing surface left forefoot between the proximal first and second metatarsals plantarly  Orthopedic: Normal joint ROM without pain or crepitus bilaterally. No visible deformities. No bony tenderness.   Radiographs: None Assessment:   1. Benign skin lesion   2. Porokeratosis    Plan:  Patient was evaluated and treated and all questions answered.  Benign skin lesion/[orokeratosis -Educated on likely etiology -Lesion debrided and destroyed as below -Corn pads  dispensed -Advised lesion will likely recur  Procedure: Destruction of Lesion Location: plantar forefoot Anesthesia: none Instrumentation: 15 blade. Technique: Debridement of lesion.  Small amount of salinocaine applied to the base of the lesion. Dressing: Dry, sterile, compression dressing. Disposition: Patient tolerated procedure well. Advised to leave dressing on for 6-8 hours. Thereafter patient to wash the area with soap and water and applied band-aid. Off-loading pads dispensed. Patient to return in 2 weeks for follow-up.   Return if symptoms worsen or fail to improve.

## 2018-04-30 ENCOUNTER — Other Ambulatory Visit (HOSPITAL_COMMUNITY): Payer: Self-pay | Admitting: Internal Medicine

## 2018-04-30 DIAGNOSIS — Z1231 Encounter for screening mammogram for malignant neoplasm of breast: Secondary | ICD-10-CM

## 2018-05-14 ENCOUNTER — Ambulatory Visit (INDEPENDENT_AMBULATORY_CARE_PROVIDER_SITE_OTHER): Payer: Medicaid Other | Admitting: Internal Medicine

## 2018-05-20 ENCOUNTER — Encounter (INDEPENDENT_AMBULATORY_CARE_PROVIDER_SITE_OTHER): Payer: Self-pay | Admitting: Internal Medicine

## 2018-05-20 ENCOUNTER — Ambulatory Visit (INDEPENDENT_AMBULATORY_CARE_PROVIDER_SITE_OTHER): Payer: Medicaid Other | Admitting: Internal Medicine

## 2018-07-17 NOTE — Progress Notes (Addendum)
[facility]  PATIENT: Virgen L Skoog DOB: [birth year]   REASON FOR VISIT: Follow-up for obstructive sleep apnea with BiPAP  compliance HISTORY FROM: Patient    HISTORY OF PRESENT ILLNESS:  Ms. Caffee is a 53 year old right-handed woman with an underlying medical history of asthma, COPD, hypertension, reflux disease, chronic pain on chronic narcotic pain medication, Hx of ear avulsion and s/p surgery, anxiety, depression, fibromyalgia, and overweight state, whopresents for follow-up consultation of her sleep apnea. The patient is unaccompanied today. I first met her on 08/12/2016 at the request of her primary care physician, at which time she reported snoring and daytime somnolence with an Epworth sleepiness score of 17 at the time. She was advised to proceed with sleep study testing. She had a baseline sleep study, followed by a CPAP titration study.   She saw Cecille Rubin, NP, in the interim on 06/12/2017, at which time she was able to be compliant with her BiPAP with a compliance percentage for more than 4 hours of 77%, her residual AHI was about 15 and her BiPAP pressure was increased at the time.  Today, 02/23/2018: I reviewed her BiPAP compliance data from 01/20/2018 through 02/18/2018 which is a total of 30 days, during which time she used her machine 29 days with percent used days greater than 4 hours at 93%, indicating excellent compliance with an average usage of 6 hours and 54 minutes, residual AHI suboptimal at 16 per hour secondary to primarily central events with a central apnea index of 10.9 per hour. Pressure at 14/10 cm, leak acceptable with the 95th percentile at 17 L/m. She reports tolerating BiPAP well. She uses a nasal mask. She had interim plastic surgery about 3 times altogether to her right ear. She has done fairly well. She reported sleeping well with BiPAP and has adapted well to treatment overall. Of note, she continues to take narcotic pain  medication, oxycodone 10 mg strength 4 times a day on average. UPDATE 1/7/2020CM Ms. Huot, 53 year old female returns for follow-up for obstructive sleep apnea with BiPAP compliance.  She is noticing an air leak.  Compliance data dated 06/20/2018-07/19/2018 shows compliance greater than 4 hours at 93%.  Average usage 7 hours 20 minutes set pressure 12 to 16 cm leak 95th percentile 34.9.  AHI elevated 10.6.  Central apnea events 6.8.  ESS: 7.  She continues to report sleeping well and has adapted well overall she remains on narcotic pain medication.  She returns for reevaluation  REVIEW OF SYSTEMS: Full 14 system review of systems performed and notable only for those listed, all others are neg:  Constitutional: neg  Cardiovascular: neg Ear/Nose/Throat: neg  Skin: neg Eyes: neg Respiratory: neg Gastroitestinal: neg  Hematology/Lymphatic: neg  Endocrine: neg Musculoskeletal: Chronic pain treated with narcotics Allergy/Immunology: neg Neurological: neg Psychiatric: neg Sleep : Obstructive sleep apnea with BiPAP   ALLERGIES: No Known Allergies  HOME MEDICATIONS: Outpatient Medications Prior to Visit  Medication Sig Dispense Refill  . albuterol (PROVENTIL HFA;VENTOLIN HFA) 108 (90 BASE) MCG/ACT inhaler Inhale 2 puffs into the lungs every 6 (six) hours as needed for wheezing or shortness of breath. For shortness of breath 1 Inhaler 1  . albuterol (PROVENTIL) (2.5 MG/3ML) 0.083% nebulizer solution Take 2.5 mg by nebulization every 6 (six) hours as needed for wheezing or shortness of breath.     Marland Kitchen amLODipine (NORVASC) 10 MG tablet Take 20 mg by mouth daily.    . diazepam (VALIUM) 10 MG tablet Take 1 tablet (10 mg  total) by mouth every 8 (eight) hours as needed for anxiety (also for muscle spasms). 45 tablet 0  . diltiazem (CARDIZEM CD) 240 MG 24 hr capsule Take 240 mg by mouth daily.    Marland Kitchen diltiazem (CARDIZEM) 120 MG tablet Take 120 mg by mouth daily.    . furosemide (LASIX) 20 MG tablet Take  20 mg by mouth daily.     Marland Kitchen lisinopril (PRINIVIL,ZESTRIL) 20 MG tablet Take 1 tablet (20 mg total) by mouth daily. 30 tablet 0  . Omega-3 Fatty Acids (FISH OIL) 1200 MG CAPS Take 1,200 mg by mouth daily.     Marland Kitchen oxyCODONE-acetaminophen (PERCOCET) 10-325 MG per tablet Take 1-2 tablets by mouth every 4 (four) hours as needed for pain.     . pantoprazole (PROTONIX) 40 MG tablet TAKE 1 TABLET BY MOUTH TWICE DAILY BEFORE A MEAL. 60 tablet 5  . tiotropium (SPIRIVA) 18 MCG inhalation capsule Place 18 mcg into inhaler and inhale daily.     No facility-administered medications prior to visit.     PAST MEDICAL HISTORY: Past Medical History:  Diagnosis Date  . Anxiety   . Anxiety disorder   . Asthma   . Chronic pain syndrome   . COPD (chronic obstructive pulmonary disease) ([place])   . Depression   . Epigastric pain 03/29/2013  . Fatigue 03/29/2013  . Fibromyalgia   . GERD (gastroesophageal reflux disease)   . Hypertension   . Shortness of breath dyspnea   . Sleep apnea    not using Bipap right now due to ear wound    PAST SURGICAL HISTORY: Past Surgical History:  Procedure Laterality Date  . BALLOON DILATION N/A 12/31/2013   Procedure: BALLOON DILATION;  Surgeon: Rogene Houston, MD;  [facility];  Service: Endoscopy;  Laterality: N/A;  . CARPAL TUNNEL RELEASE    . CARPAL TUNNEL RELEASE  07/23/2012   Procedure: CARPAL TUNNEL RELEASE;  Surgeon: Sanjuana Kava, MD;  Location: [facility];  Service: Orthopedics;  Laterality: Left;  . CESAREAN SECTION    . CHOLECYSTECTOMY    . cyst removed Left    had surgery to removed cyst  . ENDOMETRIAL ABLATION    . ESOPHAGOGASTRODUODENOSCOPY  07/31/2011   Procedure: ESOPHAGOGASTRODUODENOSCOPY (EGD);  Surgeon: Rogene Houston, MD;  [facility];  Service: Endoscopy;  Laterality: N/A;  . ESOPHAGOGASTRODUODENOSCOPY N/A 12/31/2013   Procedure: ESOPHAGOGASTRODUODENOSCOPY (EGD);  Surgeon: Rogene Houston, MD;  [facility];  Service:  Endoscopy;  Laterality: N/A;  120-moved to [address] to notify pt  . ESOPHAGOGASTRODUODENOSCOPY (EGD) WITH ESOPHAGEAL DILATION N/A 09/30/2012   Procedure: ESOPHAGOGASTRODUODENOSCOPY (EGD) WITH ESOPHAGEAL DILATION;  Surgeon: Rogene Houston, MD;  [facility];  Service: Endoscopy;  Laterality: N/A;  245  . KNEE SURGERY Left   . MALONEY DILATION  07/31/2011   Procedure: MALONEY DILATION;  Surgeon: Rogene Houston, MD;  [facility];  Service: Endoscopy;;  . Venia Minks DILATION N/A 12/31/2013   Procedure: Venia Minks DILATION;  Surgeon: Rogene Houston, MD;  [facility];  Service: Endoscopy;  Laterality: N/A;  . MASS EXCISION  05/20/2012   Procedure: EXCISION MASS;  Surgeon: Sanjuana Kava, MD;  Location: [facility];  Service: Orthopedics;  Laterality: Left;  Repair of Fascial Defect Left Knee  . OTOPLASATY Right 09/09/2017   Procedure: POST AURICULAR FLAP TO RIGHT EAR CARTILAGE GRAFT TO RIGHT EAR FROM RIGHT  EAR;  Surgeon: Irene Limbo, MD;  Location: [place];  Service: Clinical cytogeneticist;  Laterality: Right;  . OTOPLASATY Right 10/03/2017   Procedure: ADJACENT TISSUE TRANSFER RIGHT EAR, CARTILAGE GRAFT FROM LEFT TO RIGHT EAR, FULL THICKNESS SKIN GRAFT FROM LEFT POSTAURICULAR SULCUS TO RIGHT EAR;  Surgeon: Irene Limbo, MD;  Location: [place];  Service: Plastics;  Laterality: Right;  . SAVORY DILATION N/A 12/31/2013   Procedure: SAVORY DILATION;  Surgeon: Rogene Houston, MD;  [facility];  Service: Endoscopy;  Laterality: N/A;  . SPINE SURGERY      FAMILY HISTORY: Family History  Problem Relation Age of Onset  . Asthma Mother   . Asthma Father   . Cancer Father     SOCIAL HISTORY: Social History   Socioeconomic History  . Marital status: Single    Spouse name: Not on file  . Number of children: Not on file  . Years of education: Not on file  . Highest education level: Not on file  Occupational History  . Occupation: N/A    Social Needs  . Financial resource strain: Not on file  . Food insecurity:    Worry: Not on file    Inability: Not on file  . Transportation needs:    Medical: Not on file    Non-medical: Not on file  Tobacco Use  . Smoking status: Former Smoker    Types: Cigarettes    Last attempt to quit: 10/26/2011    Years since quitting: 6.7  . Smokeless tobacco: Never Used  Substance and Sexual Activity  . Alcohol use: Yes    Alcohol/week: 2.0 standard drinks    Types: 2 Cans of beer per week    Comment: social  . Drug use: No  . Sexual activity: Yes    Birth control/protection: None, Surgical    Comment: ablation  Lifestyle  . Physical activity:    Days per week: Not on file    Minutes per session: Not on file  . Stress: Not on file  Relationships  . Social connections:    Talks on phone: Not on file    Gets together: Not on file    Attends religious service: Not on file    Active member of club or organization: Not on file    Attends meetings of clubs or organizations: Not on file    Relationship status: Not on file  . Intimate partner violence:    Fear of current or ex partner: Not on file    Emotionally abused: Not on file    Physically abused: Not on file    Forced sexual activity: Not on file  Other Topics Concern  . Not on file  Social History Narrative   Denies any caffeine use      PHYSICAL EXAM  Vitals:   07/21/18 1243  BP: 115/69  Pulse: 74  Weight: 180 lb 9.6 oz (81.9 kg)  Height: '5\' 3"'$  (1.6 m)   Body mass index is 31.99 kg/m.  Generalized: Well developed, obese female in no acute distress  Head: normocephalic and atraumatic,. Oropharynx benign  Neck: Supple,  Musculoskeletal: No deformity   Neurological examination   Mentation: Alert oriented to time, place, history taking. Attention span and concentration appropriate. Recent and remote memory intact.  Follows all commands speech and language fluent. ESS 7  Cranial nerve II-XII: Pupils were  equal round reactive to light extraocular movements were full, visual field were full on confrontational test. Facial sensation and strength were normal. hearing was intact to finger rubbing bilaterally. Uvula tongue  midline. head turning and shoulder shrug were normal and symmetric.Tongue protrusion into cheek strength was normal. Motor: normal bulk and tone, full strength in the BUE, BLE,  Sensory: normal and symmetric to light touch, Coordination: finger-nose-finger, heel-to-shin bilaterally, no dysmetria Gait and Station: Rising up from seated position without assistance, normal stance,  moderate stride, good arm swing, smooth turning, able to perform tiptoe, and heel walking without difficulty. Tandem gait is steady  DIAGNOSTIC DATA (LABS, IMAGING, TESTING) - I reviewed patient records, labs, notes, testing and imaging myself where available.  Lab Results  Component Value Date   WBC 5.7 04/28/2017   HGB 14.0 04/28/2017   HCT 42.1 04/28/2017   MCV 100.7 (H) 04/28/2017   PLT 215 04/28/2017      Component Value Date/Time   NA 139 09/05/2017 1203   K 3.8 09/05/2017 1203   CL 103 09/05/2017 1203   CO2 24 09/05/2017 1203   GLUCOSE 136 (H) 09/05/2017 1203   BUN 10 09/05/2017 1203   CREATININE 0.68 09/05/2017 1203   CALCIUM 9.5 09/05/2017 1203   PROT 7.8 04/28/2017 1521   ALBUMIN 4.2 04/28/2017 1521   AST 28 04/28/2017 1521   ALT 31 04/28/2017 1521   ALKPHOS 125 04/28/2017 1521   BILITOT 0.3 04/28/2017 1521   GFRNONAA >60 09/05/2017 1203   GFRAA >60 09/05/2017 1203    Lab Results  Component Value Date   HGBA1C 5.4 07/27/2011   No results found for: VITAMINB12 Lab Results  Component Value Date   TSH 5.060 (H) 02/22/2014      ASSESSMENT AND PLAN Courtland L Locklearis a very pleasant 75 year oldfemalewith an underlying medical history of asthma, COPD, hypertension, reflux disease, chronic pain syndrome on chronic narcotic pain medication, anxiety, depression, fibromyalgia,  and overweight state, status post traumatic injury to the right ear with status post plastic surgery reconstructive surgeries, who presents for follow-up consultation of her mixed sleep apnea. She has been on BiPAP with excellent compliance.  Nevertheless, her residual AHI is suboptimal at 10.6 and she has a significant air leak. She has primarily central events currently.  BiPAP compliance 93% Has significant leak needs mask refit Continue same settings Follow-up in 3 months Dennie Bible, [facility], [facility], APRN  [facility] [address] [place], [place] [address] [phone]  I reviewed the above note and documentation by the Nurse Practitioner and agree with the history, physical exam, assessment and plan as outlined above. I was immediately available for face-to-face consultation. Star Age, MD, PhD [facility])

## 2018-07-19 ENCOUNTER — Encounter: Payer: Self-pay | Admitting: Neurology

## 2018-07-21 ENCOUNTER — Encounter (INDEPENDENT_AMBULATORY_CARE_PROVIDER_SITE_OTHER): Payer: Self-pay | Admitting: *Deleted

## 2018-07-21 ENCOUNTER — Ambulatory Visit: Payer: Medicaid Other | Admitting: Nurse Practitioner

## 2018-07-21 ENCOUNTER — Encounter: Payer: Self-pay | Admitting: Nurse Practitioner

## 2018-07-21 VITALS — BP 115/69 | HR 74 | Ht 63.0 in | Wt 180.6 lb

## 2018-07-21 DIAGNOSIS — G4733 Obstructive sleep apnea (adult) (pediatric): Secondary | ICD-10-CM

## 2018-07-21 NOTE — Progress Notes (Signed)
Fax confirmation received cpap orders, [employer] [phone]. sy 07-21-18.

## 2018-07-21 NOTE — Patient Instructions (Signed)
BiPAP compliance 93% Has significant leak needs mask refit Continue same settings Follow-up in 3 months

## 2018-07-29 ENCOUNTER — Other Ambulatory Visit (INDEPENDENT_AMBULATORY_CARE_PROVIDER_SITE_OTHER): Payer: Self-pay | Admitting: Internal Medicine

## 2018-07-29 NOTE — Telephone Encounter (Signed)
Patient needs OV.

## 2018-09-21 ENCOUNTER — Other Ambulatory Visit (INDEPENDENT_AMBULATORY_CARE_PROVIDER_SITE_OTHER): Payer: Self-pay | Admitting: Internal Medicine

## 2018-10-05 ENCOUNTER — Other Ambulatory Visit: Payer: Self-pay | Admitting: Adult Health

## 2018-10-20 ENCOUNTER — Telehealth: Payer: Self-pay | Admitting: *Deleted

## 2018-10-20 ENCOUNTER — Encounter: Payer: Self-pay | Admitting: *Deleted

## 2018-10-20 NOTE — Telephone Encounter (Signed)
Redid webex meeting (email 2nd) sent (labeled as 2nd email).

## 2018-10-20 NOTE — Telephone Encounter (Signed)
LMVM for pt tcb re: Due to current COVID 19 pandemic, our office is severely reducing in office visits for at least the next 2 weeks, in order to minimize the risk to our patients and healthcare providers.

## 2018-10-20 NOTE — Progress Notes (Addendum)
PATIENT: [name] DOB: July 18, 1965  REASON FOR VISIT: follow up HISTORY FROM: patient  Virtual Visit via Telephone Note  I connected with Brewer on 10/21/18 at 11:15 AM EDT by telephone and verified that I am speaking with the correct person using two identifiers.   I discussed the limitations, risks, security and privacy concerns of performing an evaluation and management service by telephone and the availability of in person appointments. I also discussed with the patient that there may be a patient responsible charge related to this service. The patient expressed understanding and agreed to proceed.   History of Present Illness:  HISTORY OF PRESENT ILLNESS:  [name] is a 53 year old right-handed woman with an underlying medical history of asthma, COPD, hypertension, reflux disease, chronic pain on chronic narcotic pain medication, Hx of ear avulsion and s/p surgery, anxiety, depression, fibromyalgia, and overweight state, whopresents for follow-up consultation of her sleep apnea. The patient is unaccompanied today. I first met her on 08/12/2016 at the request of her primary care physician, at which time she reported snoring and daytime somnolence with an Epworth sleepiness score of 17 at the time. She was advised to proceed with sleep study testing. She had a baseline sleep study, followed by a CPAP titration study.   She saw Cecille Rubin, NP, in the interim on 06/12/2017, at which time she was able to be compliant with her BiPAP with a compliance percentage for more than 4 hours of 77%, her residual AHI was about 15 and her BiPAP pressure was increased at the time.  Today, 02/23/2018: I reviewed her BiPAP compliance data from 01/20/2018 through 02/18/2018 which is a total of 30 days, during which time she used her machine 29 days with percent used days greater than 4 hours at 93%, indicating excellent compliance with an average usage of 6 hours and 54 minutes,  residual AHI suboptimal at 16 per hour secondary to primarily central events with a central apnea index of 10.9 per hour. Pressure at 14/10 cm, leak acceptable with the 95thpercentile at 17 L/m. She reports tolerating BiPAP well. She uses a nasal mask. She had interim plastic surgery about 3 times altogether to her right ear. She has done fairly well. She reported sleeping well with BiPAP and has adapted well to treatment overall. Of note, she continues to take narcotic pain medication, oxycodone 10 mg strength 4 times a day on average.   UPDATE 1/7/2020CM [name], 53 year old female returns for follow-up for obstructive sleep apnea with BiPAP compliance.  She is noticing an air leak.  Compliance data dated 06/20/2018-07/19/2018 shows compliance greater than 4 hours at 93%.  Average usage 7 hours 20 minutes set pressure 12 to 16 cm leak 95th percentile 34.9.  AHI elevated 10.6.  Central apnea events 6.8.  ESS: 7.  She continues to report sleeping well and has adapted well overall she remains on narcotic pain medication.  She returns for reevaluation  10/21/18 [name] is a 53 y.o. female for follow up. She is doing well with BiPAP therapy. She has tightened her mask which she feels has helped with the leak noted at her last appt. She feels that she is sleeping fairly well. She feels rested int he mornings.   Download report as follows:  09/20/2018 - 10/19/2018 Usage 09/20/2018 - 10/19/2018 Usage days 28/30 days (93%) >= 4 hours 28 days (93%) < 4 hours 0 days (0%) Usage hours 219 hours 23 minutes Average usage (total days) 7  hours 19 minutes Average usage (days used) 7 hours 50 minutes Median usage (days used) 8 hours 0 minutes Total used hours (value since last reset - 10/19/2018) 3,059 hours AirCurve 10 S Serial number [id number] Mode Spont IPAP 16 cmH2O EPAP 12 cmH2O Easy-Breathe On Therapy Leaks - L/min Median: 1.7 95th percentile: 17.4 Maximum: 43.5 Events per hour AI:  14.0 HI: 0.4 AHI: 14.4 Apnea Index Central: 10.6 Obstructive: 3.1 Unknown: 0.2   Observations/Objective:  Generalized: Well developed, in no acute distress  Mentation: Alert oriented to time, place, history taking. Follows all commands speech and language fluent   Assessment and Plan:  53 y.o. year old female  has a past medical history of Anxiety, Anxiety disorder, Asthma, Chronic pain syndrome, COPD (chronic obstructive pulmonary disease) ([place]), Depression, Epigastric pain (03/29/2013), Fatigue (03/29/2013), Fibromyalgia, GERD (gastroesophageal reflux disease), Hypertension, Shortness of breath dyspnea, and Sleep apnea. with    ICD-10-CM   1. Obstructive sleep apnea treated with BiPAP G47.33 For home use only DME Bipap   She continues optimal compliance with BiPAP therapy. Unfortunately, she continues to have an elevated AHI of 14.4 with central apnea of 10.4. We will adjust setting to AUTO Bipap with IPAP 12-17cmH2O and EPAP 8-13 cmH20. Pressure support of 4. She was advised to continue nightly use of BiPAP and we will repeat download review in 4-6 weeks. She verbalizes agreement and understanding of plan.   Orders Placed This Encounter  Procedures  . For home use only DME Bipap    Change Bipap to auto Bipap  IPAP 12-17 EPAP 8-13 Pressure support of 4    No orders of the defined types were placed in this encounter.    Follow Up Instructions:  I discussed the assessment and treatment plan with the patient. The patient was provided an opportunity to ask questions and all were answered. The patient agreed with the plan and demonstrated an understanding of the instructions.   The patient was advised to call back or seek an in-person evaluation if the symptoms worsen or if the condition fails to improve as anticipated.  I provided 25 minutes of non-face-to-face time during this encounter. Patient is located at her place of residence during [place] video call. Provider is in the  office. Lovey Newcomer young, RN facilitated video visit.    Debbora Presto, NP    I reviewed the above note and documentation by the Nurse Practitioner and agree with the history, assessment and plan as outlined above. Patient's machine cannot be converted to autoBiPAP. She was agreeable to continue with current settings, as she feels stable and is overall doing well.  I was immediately available for phone consultation. Star Age, MD, PhD [facility])

## 2018-10-20 NOTE — Addendum Note (Signed)
Addended by: Brandon Melnick on: 10/20/2018 02:34 PM   Modules accepted: Orders

## 2018-10-20 NOTE — Telephone Encounter (Addendum)
Called pt.  She was out on a walk.  She did consent to VIDEO VISIT with Debbora Presto, NP. Due to current COVID 19 pandemic, our office is severely reducing in office visits for at least the next 2 weeks, in order to minimize the risk to our patients and healthcare providers.  Pt understands that although there may be some limitations with this type of visit, we will take all precautions to reduce any security or privacy concerns.  Pt understands that this will be treated like an in office visit and we will file with pt's insurance. Pt's email is [email or web address]. Pt understands that the [employer] webex software must be downloaded and operational on the device pt plans to use for the visit.  Went over medications (please verify her Bp meds, she was unsure of, pharmacy, history, allergies. Placed meeting, but email kicked back.  Will have to touch base with pt.  Spoke to pt and relayed that email bounced.  Confirmed email is [email or web address].

## 2018-10-20 NOTE — Telephone Encounter (Signed)
lmvm tcb (returning her call back).

## 2018-10-20 NOTE — Telephone Encounter (Signed)
Pt has returned the call to [facility], she is asking for a call back

## 2018-10-21 ENCOUNTER — Other Ambulatory Visit: Payer: Self-pay

## 2018-10-21 ENCOUNTER — Ambulatory Visit (INDEPENDENT_AMBULATORY_CARE_PROVIDER_SITE_OTHER): Payer: Medicaid Other | Admitting: Family Medicine

## 2018-10-21 ENCOUNTER — Encounter: Payer: Self-pay | Admitting: Family Medicine

## 2018-10-21 DIAGNOSIS — G4733 Obstructive sleep apnea (adult) (pediatric): Secondary | ICD-10-CM | POA: Diagnosis not present

## 2018-10-22 NOTE — Telephone Encounter (Signed)
Received call from Gilliam RT at [facility].  She had spoken to Amy, NP who needs to speak with Dr. Rexene Alberts about bipap orders.

## 2018-10-22 NOTE — Telephone Encounter (Signed)
Pt called in and stated her CPAP machine was never afjusted, she called [facility] and they advised her they havent received anything

## 2018-10-22 NOTE — Telephone Encounter (Signed)
Spoke to pt and relayed did fax order to [employer].  LMVM for RT re: received order.

## 2018-10-22 NOTE — Telephone Encounter (Signed)
Encounter order faxed to [employer] [phone] [phone].  (change bipap to autopap).

## 2018-10-26 ENCOUNTER — Telehealth: Payer: Self-pay | Admitting: Family Medicine

## 2018-10-26 NOTE — Telephone Encounter (Signed)
I have spoken with [name] regarding Dr. De Nurse suggestions.  She is in agreement with keeping current settings.  She is resting well and feels that BiPAP therapy is working with her.  I have advised that she not take narcotics too close to bedtime.  She verbalizes understanding.  We will follow-up in 3 months for a repeat download at that time.

## 2018-10-26 NOTE — Telephone Encounter (Signed)
-----   Message from Star Age, MD sent at 10/22/2018  3:41 PM EDT ----- Regarding: RE: BiPAP settings If she is okay, let's leave the settings the same.  Please ask her if she is on any narcotic pain meds, if so, she should try to reduce and at leas avoid at night.  sa ----- Message ----- From: Debbora Presto, NP Sent: 10/22/2018   3:12 PM EDT To: Star Age, MD Subject: BiPAP settings                                 Hey Dr Rexene Alberts! [name] received a new BiPAP machine in 2018 and is not eligible for a new machine. Apparently her current machine is not capable of being switched to auto BiPAP. Do you want to change settings on current machine or just watch for now?

## 2018-10-26 NOTE — Telephone Encounter (Signed)
[name] spoke to pt today.

## 2018-11-03 ENCOUNTER — Other Ambulatory Visit: Payer: Medicaid Other | Admitting: Adult Health

## 2018-12-02 ENCOUNTER — Other Ambulatory Visit: Payer: Self-pay

## 2018-12-02 ENCOUNTER — Encounter: Payer: Self-pay | Admitting: Family Medicine

## 2018-12-02 ENCOUNTER — Ambulatory Visit (INDEPENDENT_AMBULATORY_CARE_PROVIDER_SITE_OTHER): Payer: Medicaid Other | Admitting: Family Medicine

## 2018-12-02 DIAGNOSIS — G4733 Obstructive sleep apnea (adult) (pediatric): Secondary | ICD-10-CM | POA: Diagnosis not present

## 2018-12-02 NOTE — Progress Notes (Addendum)
PATIENT: Ardenia L Budzik DOB: [birth year]  REASON FOR VISIT: follow up HISTORY FROM: patient  Virtual Visit via Telephone Note  I connected with [place] on 12/02/18 at 10:30 AM EDT by telephone and verified that I am speaking with the correct person using two identifiers.   I discussed the limitations, risks, security and privacy concerns of performing an evaluation and management service by telephone and the availability of in person appointments. I also discussed with the patient that there may be a patient responsible charge related to this service. The patient expressed understanding and agreed to proceed.   History of Present Illness:  12/02/18 Bronte L Deen is a 53 y.o. female for follow up. She continues BiPAP therapy at home.  She is using her machine nightly and for greater than 4 hours each night.  She continues to have an elevated AHI at 15.8 with IPAP of 16 cm of water and EPAP of 12 cm of water.  At her last visit we attempted to change settings to auto titration.  Unfortunately, her machine is not capable of auto titration.  She reports that she does continue to feel a small leak.  She feels that she has her mask as tight as she can tolerate it.  She is also concerned that there is something malfunctioning with her machine.  She reports that it is not using water correctly.  She is concerned as she has a use machine.  She is requesting that someone service her machine.  History:  Ms. Shands is a 53 year old right-handed woman with an underlying medical history of asthma, COPD, hypertension, reflux disease, chronic pain on chronic narcotic pain medication, Hx of ear avulsion and s/p surgery, anxiety, depression, fibromyalgia, and overweight state, whopresents for follow-up consultation of her sleep apnea. The patient is unaccompanied today. I first met her on 08/12/2016 at the request of her primary care physician, at which time she reported snoring and daytime  somnolence with an Epworth sleepiness score of 17 at the time. She was advised to proceed with sleep study testing. She had a baseline sleep study, followed by a CPAP titration study.   She saw Cecille Rubin, NP, in the interim on 06/12/2017, at which time she was able to be compliant with her BiPAP with a compliance percentage for more than 4 hours of 77%, her residual AHI was about 15 and her BiPAP pressure was increased at the time.  Today, 02/23/2018: I reviewed her BiPAP compliance data from 01/20/2018 through 02/18/2018 which is a total of 30 days, during which time she used her machine 29 days with percent used days greater than 4 hours at 93%, indicating excellent compliance with an average usage of 6 hours and 54 minutes, residual AHI suboptimal at 16 per hour secondary to primarily central events with a central apnea index of 10.9 per hour. Pressure at 14/10 cm, leak acceptable with the 95thpercentile at 17 L/m. She reports tolerating BiPAP well. She uses a nasal mask. She had interim plastic surgery about 3 times altogether to her right ear. She has done fairly well. She reported sleeping well with BiPAP and has adapted well to treatment overall. Of note, she continues to take narcotic pain medication, oxycodone 10 mg strength 4 times a day on average.   UPDATE1/7/2020CMMs. Morissette, 53 year old female returns for follow-up for obstructive sleep apnea with BiPAP compliance. She is noticing an air leak. Compliance data dated 06/20/2018-07/19/2018 shows compliance greater than 4 hours at 93%. Average usage 7  hours 20 minutes set pressure 12 to 16 cm leak 95th percentile 34.9. AHI elevated 10.6. Central apnea events 6.8. ESS: 7. She continues to report sleeping well and has adapted well overall she remains on narcotic pain medication. She returns for reevaluation  10/21/18 Aziah L Qualls is a 53 y.o. female for follow up. She is doing well with BiPAP therapy. She has tightened her  mask which she feels has helped with the leak noted at her last appt. She feels that she is sleeping fairly well. She feels rested int he mornings.   Download report as follows:  09/20/2018 - 10/19/2018 Usage 09/20/2018 - 10/19/2018 Usage days 28/30 days (93%) >= 4 hours 28 days (93%) < 4 hours 0 days (0%) Usage hours 219 hours 23 minutes Average usage (total days) 7 hours 19 minutes Average usage (days used) 7 hours 50 minutes Median usage (days used) 8 hours 0 minutes Total used hours (value since last reset - 10/19/2018) 3,059 hours AirCurve 10 S Serial number [id number] Mode Spont IPAP 16 cmH2O EPAP 12 cmH2O Easy-Breathe On Therapy Leaks - L/min Median: 1.7 95th percentile: 17.4 Maximum: 43.5 Events per hour AI: 14.0 HI: 0.4 AHI: 14.4 Apnea Index Central: 10.6 Obstructive: 3.1 Unknown: 0.   Observations/Objective:  Generalized: Well developed, in no acute distress  Mentation: Alert oriented to time, place, history taking. Follows all commands speech and language fluent   Assessment and Plan:  53 y.o. year old female  has a past medical history of Anxiety, Anxiety disorder, Asthma, Chronic pain syndrome, COPD (chronic obstructive pulmonary disease) ([place]), Depression, Epigastric pain (03/29/2013), Fatigue (03/29/2013), Fibromyalgia, GERD (gastroesophageal reflux disease), Hypertension, Shortness of breath dyspnea, and Sleep apnea. here with    ICD-10-CM   1. OSA treated with BiPAP G47.33    She does continue to do well with BiPAP therapy.  Unfortunately her AHI remains elevated at 15.8. She is feeling well and feels that she is sleeping well.  I will send order to the DME company today for mask refitting as well as ask them to take a look at her machine.  I have scheduled 4-week follow-up with her to keep a close eye on AHI. She verbalizes agreement and understanding with this plan.  No orders of the defined types were placed in this encounter.   No orders of the  defined types were placed in this encounter.    Follow Up Instructions:  I discussed the assessment and treatment plan with the patient. The patient was provided an opportunity to ask questions and all were answered. The patient agreed with the plan and demonstrated an understanding of the instructions.   The patient was advised to call back or seek an in-person evaluation if the symptoms worsen or if the condition fails to improve as anticipated.  I provided 20 minutes of non-face-to-face time during this encounter.  Patient is located at her place of residence during video conference.  Provider is located at her place of residence.  Liane Comber, RN helped to facilitate visit.   Debbora Presto, NP   I reviewed the above note and documentation by the Nurse Practitioner and agree with the history, assessment and plan as outlined above. I was immediately available for consultation. Star Age, MD, PhD [facility])

## 2018-12-08 ENCOUNTER — Telehealth: Payer: Self-pay

## 2018-12-08 NOTE — Telephone Encounter (Signed)
Orders for CPAP have been faxed to her DME company. Confirmation fax has been received.   DME: [phone]  [phone]

## 2018-12-30 ENCOUNTER — Other Ambulatory Visit: Payer: Medicaid Other | Admitting: Adult Health

## 2019-01-05 ENCOUNTER — Telehealth: Payer: Self-pay

## 2019-01-05 NOTE — Telephone Encounter (Signed)
Unable to get in contact with the patient to convert their office appt with Amy on 01/06/2019 into a mychart video visit. I left a voicemail asking the patient to return my call. Office number was provided.    If patient calls back please convert their office visit into a mychart video visit.

## 2019-01-06 ENCOUNTER — Ambulatory Visit: Payer: Medicaid Other | Admitting: Family Medicine

## 2019-02-16 NOTE — Progress Notes (Addendum)
PATIENT: [name] DOB: 20-Jun-1966  REASON FOR VISIT: follow up HISTORY FROM: patient  Chief Complaint  Patient presents with   Follow-up    Room 2, alone. OSA/ Bipap     HISTORY OF PRESENT ILLNESS: Today 02/17/19 [name] is a 53 y.o. female here today for follow up of OSA on Bipap.  He continues to use BiPAP therapy regularly.  She reports feeling well.  She feels well rested in the mornings.  She denies excessive fatigue throughout the day.  Compliance report dated 01/17/2019 through 02/15/2019 reveals that she is used BiPAP 27 out of the last 30 days for compliance of 90%.  26 of those days she used her BiPAP greater than 4 hours for compliance of 87%.  AHI remains elevated at 17.3 with IPAP of 16 cm of water and EPAP of 12 cm of water.  Central apneas remain elevated at 13.3.  There was a leak noted in the 95th percentile of 26.3.  She feels that her mask is fitting well.  At her last visit I ordered a mask refitting and ask DME company to make sure that her BiPAP was working correctly.  She states that this has not occurred.  She has weaned oxycodone from 1 to 2 tablets every 6 hours to 1 to 2 tablets/day.  She does report that last dose of medication is around 8 or 9:00 PM.  She feels that this is necessary to help her rest.   HISTORY: (copied from my note on 12/02/2018)  [name] is a 53 y.o. female for follow up. She continues BiPAP therapy at home.  She is using her machine nightly and for greater than 4 hours each night.  She continues to have an elevated AHI at 15.8 with IPAP of 16 cm of water and EPAP of 12 cm of water.  At her last visit we attempted to change settings to auto titration.  Unfortunately, her machine is not capable of auto titration.  She reports that she does continue to feel a small leak.  She feels that she has her mask as tight as she can tolerate it.  She is also concerned that there is something malfunctioning with her machine.  She reports  that it is not using water correctly.  She is concerned as she has a use machine.  She is requesting that someone service her machine.  History:  [name] is a 53 year old right-handed woman with an underlying medical history of asthma, COPD, hypertension, reflux disease, chronic pain on chronic narcotic pain medication, Hx of ear avulsion and s/p surgery, anxiety, depression, fibromyalgia, and overweight state, whopresents for follow-up consultation of her sleep apnea. The patient is unaccompanied today. I first met her on 08/12/2016 at the request of her primary care physician, at which time she reported snoring and daytime somnolence with an Epworth sleepiness score of 17 at the time. She was advised to proceed with sleep study testing. She had a baseline sleep study, followed by a CPAP titration study.   She saw Christine Rubin, NP, in the interim on 06/12/2017, at which time she was able to be compliant with her BiPAP with a compliance percentage for more than 4 hours of 77%, her residual AHI was about 15 and her BiPAP pressure was increased at the time.  Today, 02/23/2018: I reviewed her BiPAP compliance data from 01/20/2018 through 02/18/2018 which is a total of 30 days, during which time she used her machine 29  days with percent used days greater than 4 hours at 93%, indicating excellent compliance with an average usage of 6 hours and 54 minutes, residual AHI suboptimal at 16 per hour secondary to primarily central events with a central apnea index of 10.9 per hour. Pressure at 14/10 cm, leak acceptable with the 95thpercentile at 17 L/m. She reports tolerating BiPAP well. She uses a nasal mask. She had interim plastic surgery about 3 times altogether to her right ear. She has done fairly well. She reported sleeping well with BiPAP and has adapted well to treatment overall. Of note, she continues to take narcotic pain medication, oxycodone 10 mg strength 4 times a day on  average.   UPDATE1/7/2020CMMs. [name], 53 year old female returns for follow-up for obstructive sleep apnea with BiPAP compliance. She is noticing an air leak. Compliance data dated 06/20/2018-07/19/2018 shows compliance greater than 4 hours at 93%. Average usage 7 hours 20 minutes set pressure 12 to 16 cm leak 95th percentile 34.9. AHI elevated 10.6. Central apnea events 6.8. ESS: 7. She continues to report sleeping well and has adapted well overall she remains on narcotic pain medication. She returns for reevaluation  10/21/18 Christine L Locklearis a 53 y.o.femalefor follow up. She is doing well with BiPAP therapy. She has tightened her mask which she feels has helped with the leak noted at her last appt. She feels that she is sleeping fairly well. She feels rested int he mornings.  Download report as follows:  09/20/2018 - 10/19/2018 Usage 09/20/2018 - 10/19/2018 Usage days 28/30 days (93%) >= 4 hours 28 days (93%) < 4 hours 0 days (0%) Usage hours 219 hours 23 minutes Average usage (total days) 7 hours 19 minutes Average usage (days used) 7 hours 50 minutes Median usage (days used) 8 hours 0 minutes Total used hours (value since last reset - 10/19/2018) 3,059 hours AirCurve 10 S Serial number [id number] Mode Spont IPAP 16 cmH2O EPAP 12 cmH2O Easy-Breathe On Therapy Leaks - L/min Median: 1.7 95th percentile: 17.4 Maximum: 43.5 Events per hour AI: 14.0 HI: 0.4 AHI: 14.4 Apnea Index Central: 10.6 Obstructive: 3.1 Unknown: 0.  REVIEW OF SYSTEMS: Out of a complete 14 system review of symptoms, the patient complains only of the following symptoms, none and all other reviewed systems are negative.  Epworth sleepiness scale: 9 Fatigue severity scale: 16  ALLERGIES: No Known Allergies  HOME MEDICATIONS: Outpatient Medications Prior to Visit  Medication Sig Dispense Refill   albuterol (PROVENTIL HFA;VENTOLIN HFA) 108 (90 BASE) MCG/ACT inhaler Inhale 2 puffs into  the lungs every 6 (six) hours as needed for wheezing or shortness of breath. For shortness of breath 1 Inhaler 1   albuterol (PROVENTIL) (2.5 MG/3ML) 0.083% nebulizer solution Take 2.5 mg by nebulization every 6 (six) hours as needed for wheezing or shortness of breath.      Albuterol Sulfate POWD albuterol sulf 90 mcg/actuation breath activated powder inhaler,sensor  Inhale 2 puffs every 4 hours by inhalation route.     amLODipine (NORVASC) 10 MG tablet Take 20 mg by mouth daily.     diltiazem (CARDIZEM CD) 240 MG 24 hr capsule Take 240 mg by mouth daily.     diltiazem (CARDIZEM) 120 MG tablet Take 120 mg by mouth daily.     furosemide (LASIX) 20 MG tablet Take 20 mg by mouth as needed.      lisinopril (PRINIVIL,ZESTRIL) 20 MG tablet Take 1 tablet (20 mg total) by mouth daily. 30 tablet 0   oxyCODONE-acetaminophen (PERCOCET) 10-325 MG per  tablet Take 1-2 tablets by mouth every 4 (four) hours as needed for pain.      pantoprazole (PROTONIX) 40 MG tablet TAKE 1 TABLET BY MOUTH TWICE DAILY BEFORE A MEAL. 60 tablet 4   tiotropium (SPIRIVA) 18 MCG inhalation capsule Place 18 mcg into inhaler and inhale daily.     diazepam (VALIUM) 10 MG tablet Take 1 tablet (10 mg total) by mouth every 8 (eight) hours as needed for anxiety (also for muscle spasms). 45 tablet 0   No facility-administered medications prior to visit.     PAST MEDICAL HISTORY: Past Medical History:  Diagnosis Date   Anxiety    Anxiety disorder    Asthma    Chronic pain syndrome    COPD (chronic obstructive pulmonary disease) (HCC)    Depression    Epigastric pain 03/29/2013   Fatigue 03/29/2013   Fibromyalgia    GERD (gastroesophageal reflux disease)    Hypertension    Shortness of breath dyspnea    Sleep apnea    not using Bipap right now due to ear wound    PAST SURGICAL HISTORY: Past Surgical History:  Procedure Laterality Date   BALLOON DILATION N/A 12/31/2013   Procedure: BALLOON DILATION;   Surgeon: Rogene Houston, MD;  [facility];  Service: Endoscopy;  Laterality: N/A;   CARPAL TUNNEL RELEASE     CARPAL TUNNEL RELEASE  07/23/2012   Procedure: CARPAL TUNNEL RELEASE;  Surgeon: Sanjuana Kava, MD;  Location: [facility];  Service: Orthopedics;  Laterality: Left;   CESAREAN SECTION     CHOLECYSTECTOMY     cyst removed Left    had surgery to removed cyst   ENDOMETRIAL ABLATION     ESOPHAGOGASTRODUODENOSCOPY  07/31/2011   Procedure: ESOPHAGOGASTRODUODENOSCOPY (EGD);  Surgeon: Rogene Houston, MD;  [facility];  Service: Endoscopy;  Laterality: N/A;   ESOPHAGOGASTRODUODENOSCOPY N/A 12/31/2013   Procedure: ESOPHAGOGASTRODUODENOSCOPY (EGD);  Surgeon: Rogene Houston, MD;  [facility];  Service: Endoscopy;  Laterality: N/A;  120-moved to 44 Ann to notify pt   ESOPHAGOGASTRODUODENOSCOPY (EGD) WITH ESOPHAGEAL DILATION N/A 09/30/2012   Procedure: ESOPHAGOGASTRODUODENOSCOPY (EGD) WITH ESOPHAGEAL DILATION;  Surgeon: Rogene Houston, MD;  [facility];  Service: Endoscopy;  Laterality: N/A;  245   KNEE SURGERY Left    MALONEY DILATION  07/31/2011   Procedure: MALONEY DILATION;  Surgeon: Rogene Houston, MD;  [facility];  Service: Endoscopy;;   MALONEY DILATION N/A 12/31/2013   Procedure: Venia Minks DILATION;  Surgeon: Rogene Houston, MD;  [facility];  Service: Endoscopy;  Laterality: N/A;   MASS EXCISION  05/20/2012   Procedure: EXCISION MASS;  Surgeon: Sanjuana Kava, MD;  Location: [facility];  Service: Orthopedics;  Laterality: Left;  Repair of Fascial Defect Left Knee   OTOPLASATY Right 09/09/2017   Procedure: POST AURICULAR FLAP TO RIGHT EAR CARTILAGE GRAFT TO RIGHT EAR FROM RIGHT  EAR;  Surgeon: Irene Limbo, MD;  Location: [facility];  Service: Plastics;  Laterality: Right;   OTOPLASATY Right 10/03/2017   Procedure: ADJACENT TISSUE TRANSFER RIGHT EAR, CARTILAGE GRAFT FROM LEFT TO RIGHT EAR, FULL  THICKNESS SKIN GRAFT FROM LEFT POSTAURICULAR SULCUS TO RIGHT EAR;  Surgeon: Irene Limbo, MD;  Location: [place];  Service: Plastics;  Laterality: Right;   SAVORY DILATION N/A 12/31/2013   Procedure: SAVORY DILATION;  Surgeon: Rogene Houston, MD;  [facility];  Service: Endoscopy;  Laterality: N/A;  SPINE SURGERY      FAMILY HISTORY: Family History  Problem Relation Age of Onset   Asthma Mother    Asthma Father    Cancer Father     SOCIAL HISTORY: Social History   Socioeconomic History   Marital status: Single    Spouse name: Not on file   Number of children: Not on file   Years of education: Not on file   Highest education level: Not on file  Occupational History   Occupation: N/A  Social Needs   Financial resource strain: Not on file   Food insecurity    Worry: Not on file    Inability: Not on file   Transportation needs    Medical: Not on file    Non-medical: Not on file  Tobacco Use   Smoking status: Former Smoker    Types: Cigarettes    Quit date: 10/26/2011    Years since quitting: 7.3   Smokeless tobacco: Never Used  Substance and Sexual Activity   Alcohol use: Yes    Alcohol/week: 2.0 standard drinks    Types: 2 Cans of beer per week    Comment: social   Drug use: No   Sexual activity: Yes    Birth control/protection: None, Surgical    Comment: ablation  Lifestyle   Physical activity    Days per week: Not on file    Minutes per session: Not on file   Stress: Not on file  Relationships   Social connections    Talks on phone: Not on file    Gets together: Not on file    Attends religious service: Not on file    Active member of club or organization: Not on file    Attends meetings of clubs or organizations: Not on file    Relationship status: Not on file   Intimate partner violence    Fear of current or ex partner: Not on file    Emotionally abused: Not on file    Physically abused: Not on  file    Forced sexual activity: Not on file  Other Topics Concern   Not on file  Social History Narrative   Denies any caffeine use     PHYSICAL EXAM  Vitals:   02/17/19 0820  BP: 125/86  Pulse: 70  Temp: 98 F (36.7 C)  Weight: 169 lb 3.2 oz (76.7 kg)  Height: '5\' 4"'$  (1.626 m)   Body mass index is 29.04 kg/m.  Generalized: Well developed, in no acute distress  Mallampati 3+, neck circ 14.5" Neurological examination  Mentation: Alert oriented to time, place, history taking. Follows all commands speech and language fluent Cranial nerve II-XII: Pupils were equal round reactive to light. Extraocular movements were full, visual field were full on confrontational test. Facial sensation and strength were normal. Uvula tongue midline. Head turning and shoulder shrug  were normal and symmetric. Motor: The motor testing reveals 5 over 5 strength of all 4 extremities. Good symmetric motor tone is noted throughout.  Gait and station: Gait is normal.   DIAGNOSTIC DATA (LABS, IMAGING, TESTING) - I reviewed patient records, labs, notes, testing and imaging myself where available.  No flowsheet data found.   Lab Results  Component Value Date   WBC 5.7 04/28/2017   HGB 14.0 04/28/2017   HCT 42.1 04/28/2017   MCV 100.7 (H) 04/28/2017   PLT 215 04/28/2017      Component Value Date/Time   NA 139 09/05/2017 1203   K 3.8 09/05/2017  1203   CL 103 09/05/2017 1203   CO2 24 09/05/2017 1203   GLUCOSE 136 (H) 09/05/2017 1203   BUN 10 09/05/2017 1203   CREATININE 0.68 09/05/2017 1203   CALCIUM 9.5 09/05/2017 1203   PROT 7.8 04/28/2017 1521   ALBUMIN 4.2 04/28/2017 1521   AST 28 04/28/2017 1521   ALT 31 04/28/2017 1521   ALKPHOS 125 04/28/2017 1521   BILITOT 0.3 04/28/2017 1521   GFRNONAA >60 09/05/2017 1203   GFRAA >60 09/05/2017 1203   No results found for: CHOL, HDL, LDLCALC, LDLDIRECT, TRIG, CHOLHDL Lab Results  Component Value Date   HGBA1C 5.4 07/27/2011   No results found  for: VITAMINB12 Lab Results  Component Value Date   TSH 5.060 (H) 02/22/2014     ASSESSMENT AND PLAN 53 y.o. year old female  has a past medical history of Anxiety, Anxiety disorder, Asthma, Chronic pain syndrome, COPD (chronic obstructive pulmonary disease) ([place]), Depression, Epigastric pain (03/29/2013), Fatigue (03/29/2013), Fibromyalgia, GERD (gastroesophageal reflux disease), Hypertension, Shortness of breath dyspnea, and Sleep apnea. here with     ICD-10-CM   1. Obstructive sleep apnea treated with BiPAP  G47.33 For home use only DME Bipap    Yasmina continues to do well with BiPAP therapy at home.  Unfortunately, her AHI remains elevated.  Orders placed in May for a mask fitting.  Patient reports that this did not occur.  We will replace order today and call [facility] to ensure that order was appropriately placed.  I have encouraged to call if she has not heard back from DME by Monday.  She verbalizes understanding and agreement with this plan.  We will repeat download in 4 weeks to assess improvement in week as well as AHI.  Marian verbalizes understanding and agreement with this plan.   Orders Placed This Encounter  Procedures   For home use only DME Bipap    Patient needs mask refitting please as noted leak with compliance report    Order Specific Question:   Length of Need    Answer:   Lifetime     No orders of the defined types were placed in this encounter.     I spent 15 minutes with the patient. 50% of this time was spent counseling and educating patient on plan of care and medications.    Debbora Presto, FNP-C 02/17/2019, 11:12 AM [facility] [address] [place], [place] [address] [phone]  I reviewed the above note and documentation by the Nurse Practitioner and agree with the history, exam, assessment and plan as outlined above. I was immediately available for consultation. Star Age, MD, PhD [facility]  [facility])

## 2019-02-17 ENCOUNTER — Ambulatory Visit: Payer: Medicaid Other | Admitting: Family Medicine

## 2019-02-17 ENCOUNTER — Encounter: Payer: Self-pay | Admitting: Family Medicine

## 2019-02-17 ENCOUNTER — Other Ambulatory Visit: Payer: Self-pay

## 2019-02-17 VITALS — BP 125/86 | HR 70 | Temp 98.0°F | Ht 64.0 in | Wt 169.2 lb

## 2019-02-17 DIAGNOSIS — G4733 Obstructive sleep apnea (adult) (pediatric): Secondary | ICD-10-CM | POA: Diagnosis not present

## 2019-02-24 ENCOUNTER — Other Ambulatory Visit: Payer: Medicaid Other | Admitting: Adult Health

## 2019-03-24 ENCOUNTER — Telehealth: Payer: Self-pay | Admitting: Family Medicine

## 2019-03-24 NOTE — Telephone Encounter (Signed)
Please let her know that I have reviewed her compliance report. Her apneic events have improved some. New AHI is 13.3. There is still a bit of a leak. We could do mask refitting if this has not been completed. Otherwise we will see her back for follow up as advised.

## 2019-03-25 NOTE — Telephone Encounter (Signed)
Spoke with the patient and she verbalized understanding her compliance results. She stated that she hasn't had a mask refitting due to medicaid not covering it.

## 2019-04-01 ENCOUNTER — Telehealth: Payer: Self-pay | Admitting: Obstetrics and Gynecology

## 2019-04-01 NOTE — Telephone Encounter (Signed)
Called patient regarding appointment scheduled in our office encouraged to come alone to the visit if possible, however, a support person, over age 51, may accompany her  to appointment if assistance is needed for safety or care concerns. Otherwise, support persons should remain outside until the visit is complete.  ° °We ask if you have had any exposure to anyone suspected or confirmed of having COVID-19 or if you are experiencing any of the following, to call and reschedule your appointment: fever, cough, shortness of breath, muscle pain, diarrhea, rash, vomiting, abdominal pain, red eye, weakness, bruising, bleeding, joint pain, or a severe headache.  ° °Please know we will ask you these questions or similar questions when you arrive for your appointment and again it’s how we are keeping everyone safe.   ° °Also,to keep you safe, please use the provided hand sanitizer when you enter the office. We are asking everyone in the office to wear a mask to help prevent the spread of °germs. If you have a mask of your own, please wear it to your appointment, if not, we are happy to provide one for you. ° °Thank you for understanding and your cooperation.  ° ° °[facility] Staff ° ° ° °

## 2019-04-02 ENCOUNTER — Other Ambulatory Visit: Payer: Self-pay

## 2019-04-02 ENCOUNTER — Encounter: Payer: Self-pay | Admitting: Obstetrics and Gynecology

## 2019-04-02 ENCOUNTER — Ambulatory Visit (INDEPENDENT_AMBULATORY_CARE_PROVIDER_SITE_OTHER): Payer: Medicaid Other | Admitting: Obstetrics and Gynecology

## 2019-04-02 ENCOUNTER — Other Ambulatory Visit (HOSPITAL_COMMUNITY)
Admission: RE | Admit: 2019-04-02 | Discharge: 2019-04-02 | Disposition: A | Discharge status: Home / Self Care | Payer: Medicaid Other | Source: Ambulatory Visit | Attending: Obstetrics and Gynecology | Admitting: Obstetrics and Gynecology

## 2019-04-02 VITALS — BP 130/76 | HR 79 | Ht 64.0 in | Wt 161.0 lb

## 2019-04-02 DIAGNOSIS — Z01419 Encounter for gynecological examination (general) (routine) without abnormal findings: Secondary | ICD-10-CM

## 2019-04-02 DIAGNOSIS — Z0000 Encounter for general adult medical examination without abnormal findings: Secondary | ICD-10-CM

## 2019-04-02 NOTE — Progress Notes (Signed)
Patient ID: [name], female   DOB: Oct 18, 1965, 53 y.o.   MRN: [id number]   Assessment:  Annual Gyn Exam, s/p cesarean x 2 , endometrial ablation.  Plan:  1. pap smear done, next pap due 3 years 2. return annually or prn 3    Annual mammogram advised after age 72 Subjective:  [name] is a 53 y.o. female G2P2 who presents for annual exam. No LMP recorded. Patient has had an ablation. The patient has complaints today of none. Had ablation with no bleeding.  The following portions of the patient's history were reviewed and updated as appropriate: allergies, current medications, past family history, past medical history, past social history, past surgical history and problem list. Past Medical History:  Diagnosis Date  . Anxiety   . Anxiety disorder   . Asthma   . Chronic pain syndrome   . COPD (chronic obstructive pulmonary disease) ([name])   . Depression   . Epigastric pain 03/29/2013  . Fatigue 03/29/2013  . Fibromyalgia   . GERD (gastroesophageal reflux disease)   . Hypertension   . Shortness of breath dyspnea   . Sleep apnea    not using Bipap right now due to ear wound    Past Surgical History:  Procedure Laterality Date  . BALLOON DILATION N/A 12/31/2013   Procedure: BALLOON DILATION;  Surgeon: Rogene Houston, MD;  [facility];  Service: Endoscopy;  Laterality: N/A;  . CARPAL TUNNEL RELEASE    . CARPAL TUNNEL RELEASE  07/23/2012   Procedure: CARPAL TUNNEL RELEASE;  Surgeon: Sanjuana Kava, MD;  Location: [facility];  Service: Orthopedics;  Laterality: Left;  . CESAREAN SECTION    . CHOLECYSTECTOMY    . cyst removed Left    had surgery to removed cyst  . ENDOMETRIAL ABLATION    . ESOPHAGOGASTRODUODENOSCOPY  07/31/2011   Procedure: ESOPHAGOGASTRODUODENOSCOPY (EGD);  Surgeon: Rogene Houston, MD;  [facility];  Service: Endoscopy;  Laterality: N/A;  . ESOPHAGOGASTRODUODENOSCOPY N/A 12/31/2013   Procedure: ESOPHAGOGASTRODUODENOSCOPY (EGD);   Surgeon: Rogene Houston, MD;  [facility]: [facility];  Service: Endoscopy;  Laterality: N/A;  120-moved to [address] to notify pt  . ESOPHAGOGASTRODUODENOSCOPY (EGD) WITH ESOPHAGEAL DILATION N/A 09/30/2012   Procedure: ESOPHAGOGASTRODUODENOSCOPY (EGD) WITH ESOPHAGEAL DILATION;  Surgeon: Rogene Houston, MD;  [facility];  Service: Endoscopy;  Laterality: N/A;  245  . KNEE SURGERY Left   . MALONEY DILATION  07/31/2011   Procedure: MALONEY DILATION;  Surgeon: Rogene Houston, MD;  [facility]: [facility];  Service: Endoscopy;;  . Venia Minks DILATION N/A 12/31/2013   Procedure: Venia Minks DILATION;  Surgeon: Rogene Houston, MD;  [facility]: [facility];  Service: Endoscopy;  Laterality: N/A;  . MASS EXCISION  05/20/2012   Procedure: EXCISION MASS;  Surgeon: Sanjuana Kava, MD;  Location: [facility];  Service: Orthopedics;  Laterality: Left;  Repair of Fascial Defect Left Knee  . OTOPLASATY Right 09/09/2017   Procedure: POST AURICULAR FLAP TO RIGHT EAR CARTILAGE GRAFT TO RIGHT EAR FROM RIGHT  EAR;  Surgeon: Irene Limbo, MD;  Location: [place];  Service: Plastics;  Laterality: Right;  . OTOPLASATY Right 10/03/2017   Procedure: ADJACENT TISSUE TRANSFER RIGHT EAR, CARTILAGE GRAFT FROM LEFT TO RIGHT EAR, FULL THICKNESS SKIN GRAFT FROM LEFT POSTAURICULAR SULCUS TO RIGHT EAR;  Surgeon: Irene Limbo, MD;  Location: [place];  Service: Plastics;  Laterality: Right;  . SAVORY DILATION N/A 12/31/2013  Procedure: SAVORY DILATION;  Surgeon: Rogene Houston, MD;  [facility];  Service: Endoscopy;  Laterality: N/A;  . SPINE SURGERY       Current Outpatient Medications:  .  albuterol (PROVENTIL HFA;VENTOLIN HFA) 108 (90 BASE) MCG/ACT inhaler, Inhale 2 puffs into the lungs every 6 (six) hours as needed for wheezing or shortness of breath. For shortness of breath, Disp: 1 Inhaler, Rfl: 1 .  albuterol (PROVENTIL) (2.5 MG/3ML) 0.083% nebulizer solution, Take 2.5  mg by nebulization every 6 (six) hours as needed for wheezing or shortness of breath. , Disp: , Rfl:  .  Albuterol Sulfate POWD, albuterol sulf 90 mcg/actuation breath activated powder inhaler,sensor  Inhale 2 puffs every 4 hours by inhalation route., Disp: , Rfl:  .  amLODipine (NORVASC) 10 MG tablet, Take 20 mg by mouth daily., Disp: , Rfl:  .  diltiazem (CARDIZEM CD) 240 MG 24 hr capsule, Take 240 mg by mouth daily., Disp: , Rfl:  .  diltiazem (CARDIZEM) 120 MG tablet, Take 120 mg by mouth daily., Disp: , Rfl:  .  furosemide (LASIX) 20 MG tablet, Take 20 mg by mouth as needed. , Disp: , Rfl:  .  lisinopril (PRINIVIL,ZESTRIL) 20 MG tablet, Take 1 tablet (20 mg total) by mouth daily., Disp: 30 tablet, Rfl: 0 .  oxyCODONE-acetaminophen (PERCOCET) 10-325 MG per tablet, Take 1-2 tablets by mouth every 4 (four) hours as needed for pain. , Disp: , Rfl:  .  pantoprazole (PROTONIX) 40 MG tablet, TAKE 1 TABLET BY MOUTH TWICE DAILY BEFORE A MEAL., Disp: 60 tablet, Rfl: 4 .  tiotropium (SPIRIVA) 18 MCG inhalation capsule, Place 18 mcg into inhaler and inhale daily., Disp: , Rfl:   Review of Systems Constitutional: negative Gastrointestinal: negative Genitourinary: normal  Objective:  There were no vitals taken for this visit.   BMI: There is no height or weight on file to calculate BMI.  General Appearance: Alert, appropriate appearance for age. No acute distress HEENT: Grossly normal Neck / Thyroid:  Cardiovascular: RRR; normal S1, S2, no murmur Lungs: CTA bilaterally Back: No CVAT Breast Exam: No masses or nodes.No dimpling, nipple retraction or discharge. Gastrointestinal: Soft, non-tender, no masses or organomegaly Pelvic Exam:  VAGINA: normal appearing vagina with normal color and discharge, no lesions, CERVIX: tiny and atrophic UTERUS: anterior and small, RECTAL: guaiac negative stool obtained, no masses good support PAP: Pap smear done today. Lymphatic Exam: Non-palpable nodes in neck,  clavicular, axillary, or inguinal regions  Skin: no rash or abnormalities Neurologic: Normal gait and speech, no tremor  Psychiatric: Alert and oriented, appropriate affect.  Urinalysis:Not done  By signing my name below, I, [name], attest that this documentation has been prepared under the direction and in the presence of Jonnie Kind, MD. Electronically Signed: [place]. 04/02/19. 10:44 AM.  I personally performed the services described in this documentation, which was SCRIBED in my presence. The recorded information has been reviewed and considered accurate. It has been edited as necessary during review. Jonnie Kind, MD

## 2019-04-07 LAB — CYTOLOGY - PAP
Adequacy: ABSENT
Chlamydia: NEGATIVE
Diagnosis: NEGATIVE
High risk HPV: NEGATIVE
Molecular Disclaimer: 56
Molecular Disclaimer: DETECTED
Molecular Disclaimer: NEGATIVE
Molecular Disclaimer: NORMAL
Neisseria Gonorrhea: NEGATIVE

## 2019-04-16 ENCOUNTER — Other Ambulatory Visit (HOSPITAL_COMMUNITY): Payer: Self-pay | Admitting: Physician Assistant

## 2019-04-16 ENCOUNTER — Ambulatory Visit (HOSPITAL_COMMUNITY)
Admission: RE | Admit: 2019-04-16 | Discharge: 2019-04-16 | Disposition: A | Discharge status: Home / Self Care | Payer: Medicaid Other | Source: Ambulatory Visit | Attending: Physician Assistant | Admitting: Physician Assistant

## 2019-04-16 ENCOUNTER — Other Ambulatory Visit: Payer: Self-pay

## 2019-04-16 DIAGNOSIS — M25552 Pain in left hip: Secondary | ICD-10-CM | POA: Insufficient documentation

## 2019-08-20 ENCOUNTER — Telehealth (INDEPENDENT_AMBULATORY_CARE_PROVIDER_SITE_OTHER): Payer: Self-pay | Admitting: Internal Medicine

## 2019-08-23 NOTE — Telephone Encounter (Signed)
Last seen 2018 and is on high-dose PPI.  I would recommend yearly follow-up as she is overdue.  I sent 1 refill of medicine to pharmacy as requested but will need office visit prior to additional refills.

## 2019-11-03 ENCOUNTER — Other Ambulatory Visit: Payer: Self-pay

## 2019-11-03 ENCOUNTER — Encounter: Payer: Self-pay | Admitting: Family Medicine

## 2019-11-03 ENCOUNTER — Other Ambulatory Visit (INDEPENDENT_AMBULATORY_CARE_PROVIDER_SITE_OTHER): Payer: Self-pay | Admitting: Gastroenterology

## 2019-11-03 ENCOUNTER — Ambulatory Visit: Payer: Medicaid Other | Admitting: Family Medicine

## 2019-11-03 VITALS — BP 118/66 | HR 62 | Temp 97.7°F | Ht 64.0 in | Wt 155.0 lb

## 2019-11-03 DIAGNOSIS — G4731 Primary central sleep apnea: Secondary | ICD-10-CM | POA: Diagnosis not present

## 2019-11-03 DIAGNOSIS — G4730 Sleep apnea, unspecified: Secondary | ICD-10-CM | POA: Diagnosis not present

## 2019-11-03 NOTE — Progress Notes (Addendum)
PATIENT: [name] DOB: April 26, 1966  REASON FOR VISIT: follow up HISTORY FROM: patient  Chief Complaint  Patient presents with  . Follow-up    rm 5, alone bipap, pt states she sleeps well      HISTORY OF PRESENT ILLNESS: Today 11/03/19 [name] is a 54 y.o. 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  She continues to do well with BiPAP therapy at home.  She is sleeping well.  She feels well rested in the mornings.  She denies excessive fatigue.  She denies any concerns with her machine or supplies.  Compliance report dated 10/03/2019 through 11/01/2019 reveals that she used BiPAP therapy 28 of the past 30 days for compliance of 93%.  She used BiPAP greater than 4 hours 26 of the last 30 days for compliance of 87%.  Average usage was 6 hours and 45 minutes.  Residual AHI is now decreased to 8.9 with IPAP of 16 cm of water and EPAP of 12 cm of water.  AHI previously 17.  Central events of 5.7, obstructive 2.6.  There was no significant leak noted.   HISTORY: (copied from my note on 02/17/2019)  [name] is a 54 y.o. female here today for follow up of OSA on Bipap.  He continues to use BiPAP therapy regularly.  She reports feeling well.  She feels well rested in the mornings.  She denies excessive fatigue throughout the day.  Compliance report dated 01/17/2019 through 02/15/2019 reveals that she is used BiPAP 27 out of the last 30 days for compliance of 90%.  26 of those days she used her BiPAP greater than 4 hours for compliance of 87%.  AHI remains elevated at 17.3 with IPAP of 16 cm of water and EPAP of 12 cm of water.  Central apneas remain elevated at 13.3.  There was a leak noted in the 95th percentile of 26.3.  She feels that her mask is fitting well.  At her last visit I ordered a mask refitting and ask DME company to make sure that her BiPAP was working correctly.  She states that this has not occurred.  She has weaned oxycodone from 1 to 2  tablets every 6 hours to 1 to 2 tablets/day.  She does report that last dose of medication is around 8 or 9:00 PM.  She feels that this is necessary to help her rest.   HISTORY: (copied from my note on 12/02/2018)  Christine L Locklearis a 54 y.o.femalefor follow up.She continues BiPAP therapy at home. She is using her machine nightly and for greater than 4 hours each night. She continues to have an elevated AHI at 15.8 with IPAP of 16 cm of water and EPAP of 12 cm of water. At her last visit we attempted to change settings to auto titration. Unfortunately, her machine is not capable of auto titration. She reports that she does continue to feel a small leak. She feels that she has her mask as tight as she can tolerate it. She is also concerned that there is something malfunctioning with her machine. She reports that it is not using water correctly. She is concerned as she has a use machine. She is requesting that someone service her machine.  History:  [name] is a 54 year old right-handed woman with an underlying medical history of asthma, COPD, hypertension, reflux disease, chronic pain on chronic narcotic pain medication, Hx of ear avulsion and s/p surgery, anxiety,  depression, fibromyalgia, and overweight state, whopresents for follow-up consultation of her sleep apnea. The patient is unaccompanied today. I first met her on 08/12/2016 at the request of her primary care physician, at which time she reported snoring and daytime somnolence with an Epworth sleepiness score of 17 at the time. She was advised to proceed with sleep study testing. She had a baseline sleep study, followed by a CPAP titration study.   She saw Cecille Rubin, NP, in the interim on 06/12/2017, at which time she was able to be compliant with her BiPAP with a compliance percentage for more than 4 hours of 77%, her residual AHI was about 15 and her BiPAP pressure was increased at the time.  Today, 02/23/2018: I  reviewed her BiPAP compliance data from 01/20/2018 through 02/18/2018 which is a total of 30 days, during which time she used her machine 29 days with percent used days greater than 4 hours at 93%, indicating excellent compliance with an average usage of 6 hours and 54 minutes, residual AHI suboptimal at 16 per hour secondary to primarily central events with a central apnea index of 10.9 per hour. Pressure at 14/10 cm, leak acceptable with the 95thpercentile at 17 L/m. She reports tolerating BiPAP well. She uses a nasal mask. She had interim plastic surgery about 3 times altogether to her right ear. She has done fairly well. She reported sleeping well with BiPAP and has adapted well to treatment overall. Of note, she continues to take narcotic pain medication, oxycodone 10 mg strength 4 times a day on average.   UPDATE1/7/2020CMMs. [name], 54 year old female returns for follow-up for obstructive sleep apnea with BiPAP compliance. She is noticing an air leak. Compliance data dated 06/20/2018-07/19/2018 shows compliance greater than 4 hours at 93%. Average usage 7 hours 20 minutes set pressure 12 to 16 cm leak 95th percentile 34.9. AHI elevated 10.6. Central apnea events 6.8. ESS: 7. She continues to report sleeping well and has adapted well overall she remains on narcotic pain medication. She returns for reevaluation  10/21/18 Christine L Locklearis a 54 y.o.femalefor follow up. She is doing well with BiPAP therapy. She has tightened her mask which she feels has helped with the leak noted at her last appt. She feels that she is sleeping fairly well. She feels rested int he mornings.  Download report as follows:  09/20/2018 - 10/19/2018 Usage 09/20/2018 - 10/19/2018 Usage days 28/30 days (93%) >= 4 hours 28 days (93%) < 4 hours 0 days (0%) Usage hours 219 hours 23 minutes Average usage (total days) 7 hours 19 minutes Average usage (days used) 7 hours 50 minutes Median usage (days used)  8 hours 0 minutes Total used hours (value since last reset - 10/19/2018) 3,059 hours AirCurve 10 S Serial number [id number] Mode Spont IPAP 16 cmH2O EPAP 12 cmH2O Easy-Breathe On Therapy Leaks - L/min Median: 1.7 95th percentile: 17.4 Maximum: 43.5 Events per hour AI: 14.0 HI: 0.4 AHI: 14.4 Apnea Index Central: 10.6 Obstructive: 3.1 Unknown: 0.   REVIEW OF SYSTEMS: Out of a complete 14 system review of symptoms, the patient complains only of the following symptoms, none and all other reviewed systems are negative.  ESS: 6 FSS: 16  ALLERGIES: No Known Allergies  HOME MEDICATIONS: Outpatient Medications Prior to Visit  Medication Sig Dispense Refill  . albuterol (PROVENTIL HFA;VENTOLIN HFA) 108 (90 BASE) MCG/ACT inhaler Inhale 2 puffs into the lungs every 6 (six) hours as needed for wheezing or shortness of breath. For shortness of breath 1  Inhaler 1  . albuterol (PROVENTIL) (2.5 MG/3ML) 0.083% nebulizer solution Take 2.5 mg by nebulization every 6 (six) hours as needed for wheezing or shortness of breath.     . Albuterol Sulfate POWD albuterol sulf 90 mcg/actuation breath activated powder inhaler,sensor  Inhale 2 puffs every 4 hours by inhalation route.    Marland Kitchen amLODipine (NORVASC) 10 MG tablet Take 20 mg by mouth daily.    Marland Kitchen diltiazem (CARDIZEM CD) 240 MG 24 hr capsule Take 240 mg by mouth daily.    Marland Kitchen diltiazem (CARDIZEM) 120 MG tablet Take 120 mg by mouth daily.    . furosemide (LASIX) 20 MG tablet Take 20 mg by mouth as needed.     Marland Kitchen lisinopril (PRINIVIL,ZESTRIL) 20 MG tablet Take 1 tablet (20 mg total) by mouth daily. 30 tablet 0  . oxyCODONE-acetaminophen (PERCOCET) 10-325 MG per tablet Take 1-2 tablets by mouth every 4 (four) hours as needed for pain.     . pantoprazole (PROTONIX) 40 MG tablet TAKE ONE TABLET BY MOUTH TWICE A DAY BEFORE A MEAL. 60 tablet 0  . tiotropium (SPIRIVA) 18 MCG inhalation capsule Place 18 mcg into inhaler and inhale daily.     No  facility-administered medications prior to visit.    PAST MEDICAL HISTORY: Past Medical History:  Diagnosis Date  . Anxiety   . Anxiety disorder   . Asthma   . Chronic pain syndrome   . COPD (chronic obstructive pulmonary disease) ([place])   . Depression   . Epigastric pain 03/29/2013  . Fatigue 03/29/2013  . Fibromyalgia   . GERD (gastroesophageal reflux disease)   . Hypertension   . Shortness of breath dyspnea   . Sleep apnea    not using Bipap right now due to ear wound    PAST SURGICAL HISTORY: Past Surgical History:  Procedure Laterality Date  . BALLOON DILATION N/A 12/31/2013   Procedure: BALLOON DILATION;  Surgeon: Rogene Houston, MD;  [facility];  Service: Endoscopy;  Laterality: N/A;  . CARPAL TUNNEL RELEASE    . CARPAL TUNNEL RELEASE  07/23/2012   Procedure: CARPAL TUNNEL RELEASE;  Surgeon: Sanjuana Kava, MD;  Location: [facility];  Service: Orthopedics;  Laterality: Left;  . CESAREAN SECTION    . CHOLECYSTECTOMY    . cyst removed Left    had surgery to removed cyst  . ENDOMETRIAL ABLATION    . ESOPHAGOGASTRODUODENOSCOPY  07/31/2011   Procedure: ESOPHAGOGASTRODUODENOSCOPY (EGD);  Surgeon: Rogene Houston, MD;  [facility];  Service: Endoscopy;  Laterality: N/A;  . ESOPHAGOGASTRODUODENOSCOPY N/A 12/31/2013   Procedure: ESOPHAGOGASTRODUODENOSCOPY (EGD);  Surgeon: Rogene Houston, MD;  [facility];  Service: Endoscopy;  Laterality: N/A;  120-moved to [address] to notify pt  . ESOPHAGOGASTRODUODENOSCOPY (EGD) WITH ESOPHAGEAL DILATION N/A 09/30/2012   Procedure: ESOPHAGOGASTRODUODENOSCOPY (EGD) WITH ESOPHAGEAL DILATION;  Surgeon: Rogene Houston, MD;  [facility];  Service: Endoscopy;  Laterality: N/A;  245  . KNEE SURGERY Left   . MALONEY DILATION  07/31/2011   Procedure: MALONEY DILATION;  Surgeon: Rogene Houston, MD;  [facility];  Service: Endoscopy;;  . Venia Minks DILATION N/A 12/31/2013   Procedure: Venia Minks DILATION;   Surgeon: Rogene Houston, MD;  [facility];  Service: Endoscopy;  Laterality: N/A;  . MASS EXCISION  05/20/2012   Procedure: EXCISION MASS;  Surgeon: Sanjuana Kava, MD;  Location: [facility];  Service: Orthopedics;  Laterality: Left;  Repair of Fascial Defect Left Knee  .  OTOPLASATY Right 09/09/2017   Procedure: POST AURICULAR FLAP TO RIGHT EAR CARTILAGE GRAFT TO RIGHT EAR FROM RIGHT  EAR;  Surgeon: Irene Limbo, MD;  Location: [place];  Service: Plastics;  Laterality: Right;  . OTOPLASATY Right 10/03/2017   Procedure: ADJACENT TISSUE TRANSFER RIGHT EAR, CARTILAGE GRAFT FROM LEFT TO RIGHT EAR, FULL THICKNESS SKIN GRAFT FROM LEFT POSTAURICULAR SULCUS TO RIGHT EAR;  Surgeon: Irene Limbo, MD;  Location: [place];  Service: Plastics;  Laterality: Right;  . SAVORY DILATION N/A 12/31/2013   Procedure: SAVORY DILATION;  Surgeon: Rogene Houston, MD;  [facility];  Service: Endoscopy;  Laterality: N/A;  . SPINE SURGERY      FAMILY HISTORY: Family History  Problem Relation Age of Onset  . Asthma Mother   . Asthma Father   . Cancer Father     SOCIAL HISTORY: Social History   Socioeconomic History  . Marital status: Single    Spouse name: Not on file  . Number of children: Not on file  . Years of education: Not on file  . Highest education level: Not on file  Occupational History  . Occupation: N/A  Tobacco Use  . Smoking status: Former Smoker    Types: Cigarettes    Quit date: 10/26/2011    Years since quitting: 8.0  . Smokeless tobacco: Never Used  Substance and Sexual Activity  . Alcohol use: Yes    Alcohol/week: 2.0 standard drinks    Types: 2 Cans of beer per week    Comment: social  . Drug use: No  . Sexual activity: Yes    Birth control/protection: None, Surgical    Comment: ablation  Other Topics Concern  . Not on file  Social History Narrative   Denies any caffeine use    Social Determinants of Health    Financial Resource Strain:   . Difficulty of Paying Living Expenses:   Food Insecurity:   . Worried About Charity fundraiser in the Last Year:   . Arboriculturist in the Last Year:   Transportation Needs:   . Film/video editor (Medical):   Marland Kitchen Lack of Transportation (Non-Medical):   Physical Activity:   . Days of Exercise per Week:   . Minutes of Exercise per Session:   Stress:   . Feeling of Stress :   Social Connections:   . Frequency of Communication with Friends and Family:   . Frequency of Social Gatherings with Friends and Family:   . Attends Religious Services:   . Active Member of Clubs or Organizations:   . Attends Archivist Meetings:   Marland Kitchen Marital Status:   Intimate Partner Violence:   . Fear of Current or Ex-Partner:   . Emotionally Abused:   Marland Kitchen Physically Abused:   . Sexually Abused:       PHYSICAL EXAM  Vitals:   11/03/19 1247  BP: 118/66  Pulse: 62  Temp: 97.7 F (36.5 C)  Weight: 155 lb (70.3 kg)  Height: '5\' 4"'$  (1.626 m)   Body mass index is 26.61 kg/m.  Generalized: Well developed, in no acute distress  Cardiology: normal rate and rhythm, no murmur noted Respiratory: clear to auscultation bilaterally  Neurological examination  Mentation: Alert oriented to time, place, history taking. Follows all commands speech and language fluent Cranial nerve II-XII: Pupils were equal round reactive to light. Extraocular movements were full, visual field were full  Motor: The motor testing reveals 5 over  5 strength of all 4 extremities. Good symmetric motor tone is noted throughout.  Gait and station: Gait is normal.   DIAGNOSTIC DATA (LABS, IMAGING, TESTING) - I reviewed patient records, labs, notes, testing and imaging myself where available.  No flowsheet data found.   Lab Results  Component Value Date   WBC 5.7 04/28/2017   HGB 14.0 04/28/2017   HCT 42.1 04/28/2017   MCV 100.7 (H) 04/28/2017   PLT 215 04/28/2017      Component  Value Date/Time   NA 139 09/05/2017 1203   K 3.8 09/05/2017 1203   CL 103 09/05/2017 1203   CO2 24 09/05/2017 1203   GLUCOSE 136 (H) 09/05/2017 1203   BUN 10 09/05/2017 1203   CREATININE 0.68 09/05/2017 1203   CALCIUM 9.5 09/05/2017 1203   PROT 7.8 04/28/2017 1521   ALBUMIN 4.2 04/28/2017 1521   AST 28 04/28/2017 1521   ALT 31 04/28/2017 1521   ALKPHOS 125 04/28/2017 1521   BILITOT 0.3 04/28/2017 1521   GFRNONAA >60 09/05/2017 1203   GFRAA >60 09/05/2017 1203   No results found for: CHOL, HDL, LDLCALC, LDLDIRECT, TRIG, CHOLHDL Lab Results  Component Value Date   HGBA1C 5.4 07/27/2011   No results found for: VITAMINB12 Lab Results  Component Value Date   TSH 5.060 (H) 02/22/2014       ASSESSMENT AND PLAN 54 y.o. year old female  has a past medical history of Anxiety, Anxiety disorder, Asthma, Chronic pain syndrome, COPD (chronic obstructive pulmonary disease) ([place]), Depression, Epigastric pain (03/29/2013), Fatigue (03/29/2013), Fibromyalgia, GERD (gastroesophageal reflux disease), Hypertension, Shortness of breath dyspnea, and Sleep apnea. here with     ICD-10-CM   1. Complex sleep apnea syndrome  G47.31 For home use only DME Bipap  2. Sleep apnea treated with nocturnal BiPAP  G47.30 For home use only DME Bipap    Yerlin continues to do well with BiPAP therapy at home.  She is tolerating therapy and compliance report reveals optimal compliance.  AHI is now 8.9, down from 17 at previous visit in August.  We have tried multiple pressure setting changes without significant change in residual AHI.  We feel that this is most likely related to chronic opioid use.  Shewanda reports that she is feeling well.  She feels that she sleeps well.  Energy has increased.  We will continue current treatment plan.  She was encouraged to use BiPAP nightly and greater than 4 hours each night.  She will continue to follow-up with primary care closely for management of comorbidities.  She will follow-up  with [place] in 1 year, sooner if needed.  She verbalizes understanding and agreement with this plan.   Orders Placed This Encounter  Procedures  . For home use only DME Bipap    supplies    Order Specific Question:   Length of Need    Answer:   Lifetime    Order Specific Question:   Inspiratory pressure    Answer:   OTHER SEE COMMENTS    Order Specific Question:   Expiratory pressure    Answer:   OTHER SEE COMMENTS     No orders of the defined types were placed in this encounter.     I spent 15 minutes with the patient. 50% of this time was spent counseling and educating patient on plan of care and medications.    Debbora Presto, FNP-C 11/03/2019, 1:16 PM [facility] [address] [place], [place] [address] [phone]  I reviewed the above note and documentation by the Nurse Practitioner and agree with the history, exam, assessment and plan as outlined above. I was available for consultation. Star Age, MD, PhD [facility])

## 2019-11-03 NOTE — Patient Instructions (Signed)
Please continue using your BiPAP regularly. While your insurance requires that you use BiPAP at least 4 hours each night on 70% of the nights, I recommend, that you not skip any nights and use it throughout the night if you can. Getting used to BiPAP and staying with the treatment long term does take time and patience and discipline. Untreated obstructive sleep apnea when it is moderate to severe can have an adverse impact on cardiovascular health and raise her risk for heart disease, arrhythmias, hypertension, congestive heart failure, stroke and diabetes. Untreated obstructive sleep apnea causes sleep disruption, nonrestorative sleep, and sleep deprivation. This can have an impact on your day to day functioning and cause daytime sleepiness and impairment of cognitive function, memory loss, mood disturbance, and problems focussing. Using BiPAP regularly can improve these symptoms.   Follow up in 1 year   Sleep Apnea Sleep apnea affects breathing during sleep. It causes breathing to stop for a short time or to become shallow. It can also increase the risk of:  Heart attack.  Stroke.  Being very overweight (obese).  Diabetes.  Heart failure.  Irregular heartbeat. The goal of treatment is to help you breathe normally again. What are the causes? There are three kinds of sleep apnea:  Obstructive sleep apnea. This is caused by a blocked or collapsed airway.  Central sleep apnea. This happens when the brain does not send the right signals to the muscles that control breathing.  Mixed sleep apnea. This is a combination of obstructive and central sleep apnea. The most common cause of this condition is a collapsed or blocked airway. This can happen if:  Your throat muscles are too relaxed.  Your tongue and tonsils are too large.  You are overweight.  Your airway is too small. What increases the risk?  Being overweight.  Smoking.  Having a small airway.  Being older.  Being  female.  Drinking alcohol.  Taking medicines to calm yourself (sedatives or tranquilizers).  Having family members with the condition. What are the signs or symptoms?  Trouble staying asleep.  Being sleepy or tired during the day.  Getting angry a lot.  Loud snoring.  Headaches in the morning.  Not being able to focus your mind (concentrate).  Forgetting things.  Less interest in sex.  Mood swings.  Personality changes.  Feelings of sadness (depression).  Waking up a lot during the night to pee (urinate).  Dry mouth.  Sore throat. How is this diagnosed?  Your medical history.  A physical exam.  A test that is done when you are sleeping (sleep study). The test is most often done in a sleep lab but may also be done at home. How is this treated?   Sleeping on your side.  Using a medicine to get rid of mucus in your nose (decongestant).  Avoiding the use of alcohol, medicines to help you relax, or certain pain medicines (narcotics).  Losing weight, if needed.  Changing your diet.  Not smoking.  Using a machine to open your airway while you sleep, such as: ? An oral appliance. This is a mouthpiece that shifts your lower jaw forward. ? A CPAP device. This device blows air through a mask when you breathe out (exhale). ? An EPAP device. This has valves that you put in each nostril. ? A BPAP device. This device blows air through a mask when you breathe in (inhale) and breathe out.  Having surgery if other treatments do not work. It is   important to get treatment for sleep apnea. Without treatment, it can lead to:  High blood pressure.  Coronary artery disease.  In men, not being able to have an erection (impotence).  Reduced thinking ability. Follow these instructions at home: Lifestyle  Make changes that your doctor recommends.  Eat a healthy diet.  Lose weight if needed.  Avoid alcohol, medicines to help you relax, and some pain  medicines.  Do not use any products that contain nicotine or tobacco, such as cigarettes, e-cigarettes, and chewing tobacco. If you need help quitting, ask your doctor. General instructions  Take over-the-counter and prescription medicines only as told by your doctor.  If you were given a machine to use while you sleep, use it only as told by your doctor.  If you are having surgery, make sure to tell your doctor you have sleep apnea. You may need to bring your device with you.  Keep all follow-up visits as told by your doctor. This is important. Contact a doctor if:  The machine that you were given to use during sleep bothers you or does not seem to be working.  You do not get better.  You get worse. Get help right away if:  Your chest hurts.  You have trouble breathing in enough air.  You have an uncomfortable feeling in your back, arms, or stomach.  You have trouble talking.  One side of your body feels weak.  A part of your face is hanging down. These symptoms may be an emergency. Do not wait to see if the symptoms will go away. Get medical help right away. Call your local emergency services (911 in the [place] Do not drive yourself to the hospital. Summary  This condition affects breathing during sleep.  The most common cause is a collapsed or blocked airway.  The goal of treatment is to help you breathe normally while you sleep. This information is not intended to replace advice given to you by your health care provider. Make sure you discuss any questions you have with your health care provider. Document Revised: 04/17/2018 Document Reviewed: 02/24/2018 Elsevier Patient Education  2020 [employer].  

## 2019-11-04 NOTE — Progress Notes (Signed)
Fax confirmation received for [employer] for cpap orders. SY.

## 2019-11-30 DIAGNOSIS — Z0289 Encounter for other administrative examinations: Secondary | ICD-10-CM

## 2020-04-18 DIAGNOSIS — Z0271 Encounter for disability determination: Secondary | ICD-10-CM

## 2020-05-16 NOTE — Progress Notes (Unsigned)
[place] [facility] [employer] Request for Prior Approval CMN/PA form has been mailed back to [place] [facility] in the provided envelope.  A copy was made and sent for scanning.

## 2020-07-20 LAB — COLOGUARD: Cologuard: POSITIVE — AB

## 2020-08-03 ENCOUNTER — Encounter (INDEPENDENT_AMBULATORY_CARE_PROVIDER_SITE_OTHER): Payer: Self-pay | Admitting: *Deleted

## 2020-08-11 ENCOUNTER — Other Ambulatory Visit: Payer: Self-pay

## 2020-08-11 ENCOUNTER — Encounter: Payer: Self-pay | Admitting: Emergency Medicine

## 2020-08-11 ENCOUNTER — Ambulatory Visit (INDEPENDENT_AMBULATORY_CARE_PROVIDER_SITE_OTHER): Payer: Medicaid Other

## 2020-08-11 ENCOUNTER — Ambulatory Visit
Admission: EM | Admit: 2020-08-11 | Discharge: 2020-08-11 | Disposition: A | Discharge status: Home / Self Care | Payer: Medicaid Other | Attending: Emergency Medicine | Admitting: Emergency Medicine

## 2020-08-11 DIAGNOSIS — R109 Unspecified abdominal pain: Secondary | ICD-10-CM | POA: Diagnosis not present

## 2020-08-11 DIAGNOSIS — R1030 Lower abdominal pain, unspecified: Secondary | ICD-10-CM

## 2020-08-11 LAB — POCT URINALYSIS DIP (MANUAL ENTRY)
Blood, UA: NEGATIVE
Glucose, UA: NEGATIVE mg/dL
Leukocytes, UA: NEGATIVE
Nitrite, UA: NEGATIVE
Protein Ur, POC: NEGATIVE mg/dL
Spec Grav, UA: 1.02 (ref 1.010–1.025)
Urobilinogen, UA: 1 E.U./dL
pH, UA: 6 (ref 5.0–8.0)

## 2020-08-11 MED ORDER — TAMSULOSIN HCL 0.4 MG PO CAPS: 0.4000 mg | ORAL_CAPSULE | Freq: Every day | ORAL | 0 refills | Status: DC

## 2020-08-11 MED ORDER — PREDNISONE 10 MG PO TABS: 20.0000 mg | ORAL_TABLET | Freq: Every day | ORAL | 0 refills | Status: DC

## 2020-08-11 MED ORDER — CYCLOBENZAPRINE HCL 10 MG PO TABS: 10.0000 mg | ORAL_TABLET | Freq: Every day | ORAL | 0 refills | Status: DC

## 2020-08-11 NOTE — ED Provider Notes (Addendum)
[place]   [id number] 08/11/20 Arrival Time: [place]   Chief Complaint  Patient presents with  . Flank Pain     SUBJECTIVE: History from: patient.  [name] is a 55 y.o. female who presented to the urgent care with a complaint of left flank pain that radiates to lower back and lower abdomen for the past few days.  Denies any precipitating event.  She localizes the pain to the left flank.  She describes the pain as constant and achy.  She has tried OTC medications without relief.  Her symptoms are made worse with movement.  She denies similar symptoms in the past.  Denies chills, fever, nausea, vomiting, diarrhea  ROS: As per HPI.  All other pertinent ROS negative.      Past Medical History:  Diagnosis Date  . Anxiety   . Anxiety disorder   . Asthma   . Chronic pain syndrome   . COPD (chronic obstructive pulmonary disease) ([place])   . Depression   . Epigastric pain 03/29/2013  . Fatigue 03/29/2013  . Fibromyalgia   . GERD (gastroesophageal reflux disease)   . Hypertension   . Shortness of breath dyspnea   . Sleep apnea    not using Bipap right now due to ear wound   Past Surgical History:  Procedure Laterality Date  . BALLOON DILATION N/A 12/31/2013   Procedure: BALLOON DILATION;  Surgeon: Rogene Houston, MD;  [facility];  Service: Endoscopy;  Laterality: N/A;  . CARPAL TUNNEL RELEASE    . CARPAL TUNNEL RELEASE  07/23/2012   Procedure: CARPAL TUNNEL RELEASE;  Surgeon: Sanjuana Kava, MD;  Location: [facility];  Service: Orthopedics;  Laterality: Left;  . CESAREAN SECTION    . CHOLECYSTECTOMY    . cyst removed Left    had surgery to removed cyst  . ENDOMETRIAL ABLATION    . ESOPHAGOGASTRODUODENOSCOPY  07/31/2011   Procedure: ESOPHAGOGASTRODUODENOSCOPY (EGD);  Surgeon: Rogene Houston, MD;  [facility];  Service: Endoscopy;  Laterality: N/A;  . ESOPHAGOGASTRODUODENOSCOPY N/A 12/31/2013   Procedure: ESOPHAGOGASTRODUODENOSCOPY (EGD);   Surgeon: Rogene Houston, MD;  [facility];  Service: Endoscopy;  Laterality: N/A;  120-moved to [address] to notify pt  . ESOPHAGOGASTRODUODENOSCOPY (EGD) WITH ESOPHAGEAL DILATION N/A 09/30/2012   Procedure: ESOPHAGOGASTRODUODENOSCOPY (EGD) WITH ESOPHAGEAL DILATION;  Surgeon: Rogene Houston, MD;  [facility];  Service: Endoscopy;  Laterality: N/A;  245  . KNEE SURGERY Left   . MALONEY DILATION  07/31/2011   Procedure: MALONEY DILATION;  Surgeon: Rogene Houston, MD;  [facility];  Service: Endoscopy;;  . Venia Minks DILATION N/A 12/31/2013   Procedure: Venia Minks DILATION;  Surgeon: Rogene Houston, MD;  [facility];  Service: Endoscopy;  Laterality: N/A;  . MASS EXCISION  05/20/2012   Procedure: EXCISION MASS;  Surgeon: Sanjuana Kava, MD;  [facility];  Service: Orthopedics;  Laterality: Left;  Repair of Fascial Defect Left Knee  . OTOPLASATY Right 09/09/2017   Procedure: POST AURICULAR FLAP TO RIGHT EAR CARTILAGE GRAFT TO RIGHT EAR FROM RIGHT  EAR;  Surgeon: Irene Limbo, MD;  Location: [place];  Service: Plastics;  Laterality: Right;  . OTOPLASATY Right 10/03/2017   Procedure: ADJACENT TISSUE TRANSFER RIGHT EAR, CARTILAGE GRAFT FROM LEFT TO RIGHT EAR, FULL THICKNESS SKIN GRAFT FROM LEFT POSTAURICULAR SULCUS TO RIGHT EAR;  Surgeon: Irene Limbo, MD;  Location: [place];  Service: Plastics;  Laterality:  Right;  Marland Kitchen SAVORY DILATION N/A 12/31/2013   Procedure: SAVORY DILATION;  Surgeon: Rogene Houston, MD;  [facility];  Service: Endoscopy;  Laterality: N/A;  . SPINE SURGERY     No Known Allergies No current facility-administered medications on file prior to encounter.   Current Outpatient Medications on File Prior to Encounter  Medication Sig Dispense Refill  . albuterol (PROVENTIL HFA;VENTOLIN HFA) 108 (90 BASE) MCG/ACT inhaler Inhale 2 puffs into the lungs every 6 (six) hours as needed for wheezing  or shortness of breath. For shortness of breath 1 Inhaler 1  . albuterol (PROVENTIL) (2.5 MG/3ML) 0.083% nebulizer solution Take 2.5 mg by nebulization every 6 (six) hours as needed for wheezing or shortness of breath.     . Albuterol Sulfate POWD albuterol sulf 90 mcg/actuation breath activated powder inhaler,sensor  Inhale 2 puffs every 4 hours by inhalation route.    Marland Kitchen amLODipine (NORVASC) 10 MG tablet Take 20 mg by mouth daily.    Marland Kitchen diltiazem (CARDIZEM CD) 240 MG 24 hr capsule Take 240 mg by mouth daily.    Marland Kitchen diltiazem (CARDIZEM) 120 MG tablet Take 120 mg by mouth daily.    . furosemide (LASIX) 20 MG tablet Take 20 mg by mouth as needed.     Marland Kitchen lisinopril (PRINIVIL,ZESTRIL) 20 MG tablet Take 1 tablet (20 mg total) by mouth daily. 30 tablet 0  . oxyCODONE-acetaminophen (PERCOCET) 10-325 MG per tablet Take 1-2 tablets by mouth every 4 (four) hours as needed for pain.     . pantoprazole (PROTONIX) 40 MG tablet TAKE ONE TABLET BY MOUTH TWICE A DAY BEFORE A MEAL. 60 tablet 2  . tiotropium (SPIRIVA) 18 MCG inhalation capsule Place 18 mcg into inhaler and inhale daily.     Social History   Socioeconomic History  . Marital status: Single    Spouse name: Not on file  . Number of children: Not on file  . Years of education: Not on file  . Highest education level: Not on file  Occupational History  . Occupation: N/A  Tobacco Use  . Smoking status: Former Smoker    Types: Cigarettes    Quit date: 10/26/2011    Years since quitting: 8.8  . Smokeless tobacco: Never Used  Vaping Use  . Vaping Use: Never used  Substance and Sexual Activity  . Alcohol use: Yes    Alcohol/week: 2.0 standard drinks    Types: 2 Cans of beer per week    Comment: social  . Drug use: No  . Sexual activity: Yes    Birth control/protection: None, Surgical    Comment: ablation  Other Topics Concern  . Not on file  Social History Narrative   Denies any caffeine use    Social Determinants of Health   Financial  Resource Strain: Not on file  Food Insecurity: Not on file  Transportation Needs: Not on file  Physical Activity: Not on file  Stress: Not on file  Social Connections: Not on file  Intimate Partner Violence: Not on file   Family History  Problem Relation Age of Onset  . Asthma Mother   . Asthma Father   . Cancer Father     OBJECTIVE:  Vitals:   08/11/20 1526  BP: 120/72  Pulse: 80  Resp: 18  Temp: (!) 97.3 F (36.3 C)  TempSrc: Oral  SpO2: 96%     Physical Exam Vitals and nursing note reviewed.  Constitutional:      General: She is not  in acute distress.    Appearance: Normal appearance. She is normal weight. She is not ill-appearing, toxic-appearing or diaphoretic.  HENT:     Head: Normocephalic.  Cardiovascular:     Rate and Rhythm: Normal rate and regular rhythm.     Pulses: Normal pulses.     Heart sounds: Normal heart sounds. No murmur heard. No friction rub. No gallop.   Pulmonary:     Effort: Pulmonary effort is normal. No respiratory distress.     Breath sounds: Normal breath sounds. No stridor. No wheezing, rhonchi or rales.  Chest:     Chest wall: No tenderness.  Abdominal:     General: Abdomen is flat. Bowel sounds are normal.     Palpations: Abdomen is soft.     Tenderness: There is no abdominal tenderness. There is left CVA tenderness. There is no right CVA tenderness, guarding or rebound.     Hernia: No hernia is present.  Musculoskeletal:     Lumbar back: Spasms and tenderness present.  Neurological:     Mental Status: She is alert and oriented to person, place, and time.    LABS:  Results for orders placed or performed during the hospital encounter of 08/11/20 (from the past 24 hour(s))  POCT urinalysis dipstick     Status: Abnormal   Collection Time: 08/11/20  3:36 PM  Result Value Ref Range   Color, UA yellow yellow   Clarity, UA clear clear   Glucose, UA negative negative mg/dL   Bilirubin, UA small (A) negative   Ketones, POC UA  trace (5) (A) negative mg/dL   Spec Grav, UA 1.020 1.010 - 1.025   Blood, UA negative negative   pH, UA 6.0 5.0 - 8.0   Protein Ur, POC negative negative mg/dL   Urobilinogen, UA 1.0 0.2 or 1.0 E.U./dL   Nitrite, UA Negative Negative   Leukocytes, UA Negative Negative     RADIOLOGY:  DG Abdomen 1 View  Result Date: 08/11/2020 CLINICAL DATA:  Severe left flank pain EXAM: ABDOMEN - 1 VIEW COMPARISON:  02/09/2015 FINDINGS: Surgical clips in the right upper quadrant. Nonobstructed gas pattern. Phleboliths in the pelvis. No radiopaque calculi over the kidneys. Possible lucency on the right side of T11 fixating screw and surgical rods. IMPRESSION: Nonobstructed gas pattern. No radiopaque calculi over the kidneys. Possible lucency at the spinal hardware on the right side at T11, suggest dedicated lumbar radiographs for further evaluation. Electronically Signed   By: Donavan Foil M.D.   On: 08/11/2020 16:34    Abdomen x-ray is negative for kidney stone.  There is a possible lucency of the spinal hardware at the right side of T11.  Further evaluation is recommended.  I have reviewed the x-ray myself and the radiologist interpretation.  I am in agreement with the radiologist interpretation.   ASSESSMENT & PLAN:  1. Left flank pain     Meds ordered this encounter  Medications  . predniSONE (DELTASONE) 10 MG tablet    Sig: Take 2 tablets (20 mg total) by mouth daily.    Dispense:  15 tablet    Refill:  0  . cyclobenzaprine (FLEXERIL) 10 MG tablet    Sig: Take 1 tablet (10 mg total) by mouth at bedtime.    Dispense:  20 tablet    Refill:  0  . tamsulosin (FLOMAX) 0.4 MG CAPS capsule    Sig: Take 1 capsule (0.4 mg total) by mouth daily after breakfast.    Dispense:  30 capsule  Refill:  0   Patient is stable at discharge.  Evaluate for negative KUB there is a possibility of a kidney stone.  Will prescribe Flomax.  We will also prescribe prednisone and Flexeril.  Discharge  instructions  Urine analysis was negative for UTI Flexeril and prednisone were prescribed/take as directed Continue to take Tylenol as needed for pain Flomax was prescribed for possible kidney stone Follow-up with PCP Follow-up with orthopedic for further radiography evaluation  return or go to ED if you develop any new or worsening of symptoms  Reviewed expectations re: course of current medical issues. Questions answered. Outlined signs and symptoms indicating need for more acute intervention. Patient verbalized understanding. After Visit Summary given.         Emerson Monte, FNP 08/11/20 1651    Emerson Monte, FNP 08/11/20 1652

## 2020-08-11 NOTE — ED Triage Notes (Signed)
Lower back pain and left flank pain for a few days, no burning on urination

## 2020-08-11 NOTE — Discharge Instructions (Addendum)
Urine analysis was negative for UTI Flexeril and prednisone were prescribed/take as directed Continue to take Tylenol as needed for pain Flomax was prescribed for possible kidney stone Follow-up with PCP Follow-up with orthopedic for further radiography evaluation  return or go to ED if you develop any new or worsening of symptoms

## 2020-08-13 LAB — URINE CULTURE: Culture: <10000 — AB

## 2020-09-04 ENCOUNTER — Other Ambulatory Visit: Payer: Self-pay

## 2020-09-04 ENCOUNTER — Encounter (INDEPENDENT_AMBULATORY_CARE_PROVIDER_SITE_OTHER): Payer: Self-pay | Admitting: Gastroenterology

## 2020-09-04 ENCOUNTER — Encounter (INDEPENDENT_AMBULATORY_CARE_PROVIDER_SITE_OTHER): Payer: Self-pay

## 2020-09-04 ENCOUNTER — Ambulatory Visit (INDEPENDENT_AMBULATORY_CARE_PROVIDER_SITE_OTHER): Payer: Medicaid Other | Admitting: Gastroenterology

## 2020-09-04 ENCOUNTER — Other Ambulatory Visit (INDEPENDENT_AMBULATORY_CARE_PROVIDER_SITE_OTHER): Payer: Self-pay

## 2020-09-04 ENCOUNTER — Inpatient Hospital Stay (HOSPITAL_COMMUNITY): Admission: RE | Admit: 2020-09-04 | Payer: Medicaid Other | Source: Ambulatory Visit

## 2020-09-04 ENCOUNTER — Telehealth (INDEPENDENT_AMBULATORY_CARE_PROVIDER_SITE_OTHER): Payer: Self-pay

## 2020-09-04 DIAGNOSIS — R195 Other fecal abnormalities: Secondary | ICD-10-CM

## 2020-09-04 DIAGNOSIS — R1032 Left lower quadrant pain: Secondary | ICD-10-CM

## 2020-09-04 DIAGNOSIS — Z1211 Encounter for screening for malignant neoplasm of colon: Secondary | ICD-10-CM

## 2020-09-04 MED ORDER — PEG 3350-KCL-NA BICARB-NACL 420 G PO SOLR: 4000.0000 mL | ORAL | 0 refills | Status: DC

## 2020-09-04 MED ORDER — MAGNESIUM CITRATE PO SOLN: 1.0000 | Freq: Once | ORAL | 0 refills | Status: AC

## 2020-09-04 NOTE — Telephone Encounter (Signed)
[name], CMA  

## 2020-09-04 NOTE — Patient Instructions (Signed)
Schedule colonoscopy

## 2020-09-04 NOTE — H&P (View-Only) (Signed)
[name], M.D. Gastroenterology & Hepatology [facility] [address] [place], [place] [address] Primary Care Physician: [facility] [address] [place] [address]  Referring [id number] Elenore Rota, MD  Chief Complaint:  Positive Cologuard  History of Present Illness: [name] is a 55 y.o. female with PMH COPD, FBM, OSA, GERD, ashtma , HTN,HLD depression, who presents for evaluation of positive Cologuard.  The patient had a Cologuard performed on 07/20/2020 which came back positive and was referred to our clinic.  She also reports that she has presented for the last month episodes of abdominal pain in her LLQ, which she describes as spasms that last for a couple of days and resolves on its own. She does not take anything for the pain. Never had these symptoms in the past. There are no food or movement triggers. She has a BM every day.  Otherwise, she denies having any complaints.  The patient denies having any nausea, vomiting, fever, chills, hematochezia, melena, hematemesis, abdominal distention,  diarrhea, jaundice, pruritus or weight loss.  She takes Protonix 40 mg qday compliantly which completely controls her GERD.  The patient takes Percocet 1 tablet every 4 hours for back pain.  Last EGD:11/06/2010 - normal EGD Last Colonoscopy:never  FHx: neg for any gastrointestinal/liver disease, no malignancies Social: neg smoking or illicit drug use. Drinks a sx pack of beers two times a week. Surgical: c-sections, cholecystectomy, uterine ablation  Past Medical History: Past Medical History:  Diagnosis Date  . Anxiety   . Anxiety disorder   . Asthma   . Chronic pain syndrome   . COPD (chronic obstructive pulmonary disease) ([place])   . Depression   . Epigastric pain 03/29/2013  . Fatigue 03/29/2013  . Fibromyalgia   . GERD (gastroesophageal reflux disease)   . Hypertension   . Shortness  of breath dyspnea   . Sleep apnea    not using Bipap right now due to ear wound    Past Surgical History: Past Surgical History:  Procedure Laterality Date  . BALLOON DILATION N/A 12/31/2013   Procedure: BALLOON DILATION;  Surgeon: Rogene Houston, MD;  [facility];  Service: Endoscopy;  Laterality: N/A;  . CARPAL TUNNEL RELEASE    . CARPAL TUNNEL RELEASE  07/23/2012   Procedure: CARPAL TUNNEL RELEASE;  Surgeon: Sanjuana Kava, MD;  [facility];  Service: Orthopedics;  Laterality: Left;  . CESAREAN SECTION    . CHOLECYSTECTOMY    . cyst removed Left    had surgery to removed cyst  . ENDOMETRIAL ABLATION    . ESOPHAGOGASTRODUODENOSCOPY  07/31/2011   Procedure: ESOPHAGOGASTRODUODENOSCOPY (EGD);  Surgeon: Rogene Houston, MD;  [facility];  Service: Endoscopy;  Laterality: N/A;  . ESOPHAGOGASTRODUODENOSCOPY N/A 12/31/2013   Procedure: ESOPHAGOGASTRODUODENOSCOPY (EGD);  Surgeon: Rogene Houston, MD;  [facility];  Service: Endoscopy;  Laterality: N/A;  120-moved to [address] to notify pt  . ESOPHAGOGASTRODUODENOSCOPY (EGD) WITH ESOPHAGEAL DILATION N/A 09/30/2012   Procedure: ESOPHAGOGASTRODUODENOSCOPY (EGD) WITH ESOPHAGEAL DILATION;  Surgeon: Rogene Houston, MD;  [facility];  Service: Endoscopy;  Laterality: N/A;  245  . KNEE SURGERY Left   . MALONEY DILATION  07/31/2011   Procedure: MALONEY DILATION;  Surgeon: Rogene Houston, MD;  [facility];  Service: Endoscopy;;  . Venia Minks DILATION N/A 12/31/2013   Procedure: Venia Minks DILATION;  Surgeon: Rogene Houston, MD;  [facility];  Service: Endoscopy;  Laterality: N/A;  . MASS EXCISION  05/20/2012   Procedure: EXCISION MASS;  Surgeon: Sanjuana Kava, MD;  [facility];  Service: Orthopedics;  Laterality: Left;  Repair of Fascial Defect Left Knee  . OTOPLASATY Right 09/09/2017   Procedure: POST AURICULAR FLAP TO RIGHT EAR CARTILAGE GRAFT TO RIGHT EAR FROM RIGHT  EAR;  Surgeon:  Irene Limbo, MD;  Location: [place];  Service: Plastics;  Laterality: Right;  . OTOPLASATY Right 10/03/2017   Procedure: ADJACENT TISSUE TRANSFER RIGHT EAR, CARTILAGE GRAFT FROM LEFT TO RIGHT EAR, FULL THICKNESS SKIN GRAFT FROM LEFT POSTAURICULAR SULCUS TO RIGHT EAR;  Surgeon: Irene Limbo, MD;  Location: [place];  Service: Plastics;  Laterality: Right;  . SAVORY DILATION N/A 12/31/2013   Procedure: SAVORY DILATION;  Surgeon: Rogene Houston, MD;  [facility];  Service: Endoscopy;  Laterality: N/A;  . SPINE SURGERY      Family History: Family History  Problem Relation Age of Onset  . Asthma Mother   . Asthma Father   . Cancer Father     Social History: Social History   Tobacco Use  Smoking Status Former Smoker  . Types: Cigarettes  . Quit date: 10/26/2011  . Years since quitting: 8.8  Smokeless Tobacco Never Used   Social History   Substance and Sexual Activity  Alcohol Use Yes  . Alcohol/week: 2.0 standard drinks  . Types: 2 Cans of beer per week   Comment: social   Social History   Substance and Sexual Activity  Drug Use No    Allergies: No Known Allergies  Medications: Current Outpatient Medications  Medication Sig Dispense Refill  . albuterol (PROVENTIL HFA;VENTOLIN HFA) 108 (90 BASE) MCG/ACT inhaler Inhale 2 puffs into the lungs every 6 (six) hours as needed for wheezing or shortness of breath. For shortness of breath 1 Inhaler 1  . albuterol (PROVENTIL) (2.5 MG/3ML) 0.083% nebulizer solution Take 2.5 mg by nebulization every 6 (six) hours as needed for wheezing or shortness of breath.     . Albuterol Sulfate POWD albuterol sulf 90 mcg/actuation breath activated powder inhaler,sensor  Inhale 2 puffs every 4 hours by inhalation route.    Marland Kitchen amLODipine (NORVASC) 10 MG tablet Take 20 mg by mouth daily.    Marland Kitchen atorvastatin (LIPITOR) 20 MG tablet Take 20 mg by mouth daily.    Marland Kitchen diltiazem (CARDIZEM CD) 240 MG 24  hr capsule Take 240 mg by mouth daily.    Marland Kitchen diltiazem (CARDIZEM) 120 MG tablet Take 120 mg by mouth daily.    . furosemide (LASIX) 20 MG tablet Take 40 mg by mouth daily.    Marland Kitchen lisinopril (PRINIVIL,ZESTRIL) 20 MG tablet Take 1 tablet (20 mg total) by mouth daily. 30 tablet 0  . oxyCODONE-acetaminophen (PERCOCET) 10-325 MG per tablet Take 1-2 tablets by mouth every 4 (four) hours as needed for pain.     . pantoprazole (PROTONIX) 40 MG tablet TAKE ONE TABLET BY MOUTH TWICE A DAY BEFORE A MEAL. 60 tablet 2  . tiotropium (SPIRIVA) 18 MCG inhalation capsule Place 18 mcg into inhaler and inhale daily.     No current facility-administered medications for this visit.    Review of Systems: GENERAL: negative for malaise, night sweats HEENT: No changes in hearing or vision, no nose bleeds or other nasal problems. NECK: Negative for lumps, goiter, pain and significant neck swelling RESPIRATORY: Negative for cough, wheezing CARDIOVASCULAR: Negative for chest pain, leg swelling, palpitations,  orthopnea GI: SEE HPI MUSCULOSKELETAL: Negative for joint pain or swelling, back pain, and muscle pain. SKIN: Negative for lesions, rash PSYCH: Negative for sleep disturbance, mood disorder and recent psychosocial stressors. HEMATOLOGY Negative for prolonged bleeding, bruising easily, and swollen nodes. ENDOCRINE: Negative for cold or heat intolerance, polyuria, polydipsia and goiter. NEURO: negative for tremor, gait imbalance, syncope and seizures. The remainder of the review of systems is noncontributory.   Physical Exam: BP 115/68 (BP Location: Left Arm, Patient Position: Sitting, Cuff Size: Normal)   Pulse 74   Temp 97.8 F (36.6 C) (Oral)   Ht 5\' 4"  (1.626 m)   Wt 151 lb 1.9 oz (68.5 kg)   BMI 25.94 kg/m  GENERAL: The patient is AO x3, in no acute distress. HEENT: Head is normocephalic and atraumatic. EOMI are intact. Mouth is well hydrated and without lesions. NECK: Supple. No masses LUNGS: Clear  to auscultation. No presence of rhonchi/wheezing/rales. Adequate chest expansion HEART: RRR, normal s1 and s2. ABDOMEN: Soft, nontender, no guarding, no peritoneal signs, and nondistended. BS +. No masses. EXTREMITIES: Without any cyanosis, clubbing, rash, lesions or edema. NEUROLOGIC: AOx3, no focal motor deficit. SKIN: no jaundice, no rashes   Imaging/Labs: as above  I personally reviewed and interpreted the available labs, imaging and endoscopic files.  Impression and Plan: [name] is a 55 y.o. female with PMH COPD, FBM, OSA, GERD, ashtma , HTN,HLD depression, who presents for evaluation of positive Cologuard.  The patient has presented occasional episodes of abdominal pain recently but no red flag signs.  She is at average risk for colorectal cancer but given her positive Cologuard testing we will need to proceed with a colonoscopy.  I explained to the patient that it is possible that Cologuard testing can have false positives, and some of the patients may have a positive testing without having a cancer of polyp.  Patient understood and agreed.  She is not interested in taking any medication for her abdominal pain as this is self-limited.  We will prescribe a 2-day bowel prep.  -Schedule  Colonoscopy  All questions were answered.      Christine Peppers, MD Gastroenterology and Hepatology [facility]

## 2020-09-04 NOTE — Progress Notes (Signed)
Maylon Peppers, M.D. Gastroenterology & Hepatology [facility] [address] [place],  [address] Primary Care Physician: [facility] [address] [place] [address]  Referring FY:[id number] Elenore Rota, MD  Chief Complaint:  Positive Cologuard  History of Present Illness: [name] is a 55 y.o. female with PMH COPD, FBM, OSA, GERD, ashtma , HTN,HLD depression, who presents for evaluation of positive Cologuard.  The patient had a Cologuard performed on 07/20/2020 which came back positive and was referred to our clinic.  She also reports that she has presented for the last month episodes of abdominal pain in her LLQ, which she describes as spasms that last for a couple of days and resolves on its own. She does not take anything for the pain. Never had these symptoms in the past. There are no food or movement triggers. She has a BM every day.  Otherwise, she denies having any complaints.  The patient denies having any nausea, vomiting, fever, chills, hematochezia, melena, hematemesis, abdominal distention,  diarrhea, jaundice, pruritus or weight loss.  She takes Protonix 40 mg qday compliantly which completely controls her GERD.  The patient takes Percocet 1 tablet every 4 hours for back pain.  Last EGD:11/06/2010 - normal EGD Last Colonoscopy:never  FHx: neg for any gastrointestinal/liver disease, no malignancies Social: neg smoking or illicit drug use. Drinks a sx pack of beers two times a week. Surgical: c-sections, cholecystectomy, uterine ablation  Past Medical History: Past Medical History:  Diagnosis Date  . Anxiety   . Anxiety disorder   . Asthma   . Chronic pain syndrome   . COPD (chronic obstructive pulmonary disease) ([place])   . Depression   . Epigastric pain 03/29/2013  . Fatigue 03/29/2013  . Fibromyalgia   . GERD (gastroesophageal reflux disease)   . Hypertension   . Shortness  of breath dyspnea   . Sleep apnea    not using Bipap right now due to ear wound    Past Surgical History: Past Surgical History:  Procedure Laterality Date  . BALLOON DILATION N/A 12/31/2013   Procedure: BALLOON DILATION;  Surgeon: Rogene Houston, MD;  [facility];  Service: Endoscopy;  Laterality: N/A;  . CARPAL TUNNEL RELEASE    . CARPAL TUNNEL RELEASE  07/23/2012   Procedure: CARPAL TUNNEL RELEASE;  Surgeon: Sanjuana Kava, MD;  Location: [facility];  Service: Orthopedics;  Laterality: Left;  . CESAREAN SECTION    . CHOLECYSTECTOMY    . cyst removed Left    had surgery to removed cyst  . ENDOMETRIAL ABLATION    . ESOPHAGOGASTRODUODENOSCOPY  07/31/2011   Procedure: ESOPHAGOGASTRODUODENOSCOPY (EGD);  Surgeon: Rogene Houston, MD;  [facility];  Service: Endoscopy;  Laterality: N/A;  . ESOPHAGOGASTRODUODENOSCOPY N/A 12/31/2013   Procedure: ESOPHAGOGASTRODUODENOSCOPY (EGD);  Surgeon: Rogene Houston, MD;  [facility];  Service: Endoscopy;  Laterality: N/A;  120-moved to [address] to notify pt  . ESOPHAGOGASTRODUODENOSCOPY (EGD) WITH ESOPHAGEAL DILATION N/A 09/30/2012   Procedure: ESOPHAGOGASTRODUODENOSCOPY (EGD) WITH ESOPHAGEAL DILATION;  Surgeon: Rogene Houston, MD;  [facility];  Service: Endoscopy;  Laterality: N/A;  245  . KNEE SURGERY Left   . MALONEY DILATION  07/31/2011   Procedure: MALONEY DILATION;  Surgeon: Rogene Houston, MD;  [facility];  Service: Endoscopy;;  . Venia Minks DILATION N/A 12/31/2013   Procedure: Venia Minks DILATION;  Surgeon: Rogene Houston, MD;  [facility];  Service: Endoscopy;  Laterality: N/A;  . MASS EXCISION  05/20/2012   Procedure: EXCISION MASS;  Surgeon: Sanjuana Kava, MD;  Location: [facility];  Service: Orthopedics;  Laterality: Left;  Repair of Fascial Defect Left Knee  . OTOPLASATY Right 09/09/2017   Procedure: POST AURICULAR FLAP TO RIGHT EAR CARTILAGE GRAFT TO RIGHT EAR FROM RIGHT  EAR;  Surgeon:  Irene Limbo, MD;  Location: [place];  Service: Plastics;  Laterality: Right;  . OTOPLASATY Right 10/03/2017   Procedure: ADJACENT TISSUE TRANSFER RIGHT EAR, CARTILAGE GRAFT FROM LEFT TO RIGHT EAR, FULL THICKNESS SKIN GRAFT FROM LEFT POSTAURICULAR SULCUS TO RIGHT EAR;  Surgeon: Irene Limbo, MD;  Location: [place];  Service: Plastics;  Laterality: Right;  . SAVORY DILATION N/A 12/31/2013   Procedure: SAVORY DILATION;  Surgeon: Rogene Houston, MD;  [facility];  Service: Endoscopy;  Laterality: N/A;  . SPINE SURGERY      Family History: Family History  Problem Relation Age of Onset  . Asthma Mother   . Asthma Father   . Cancer Father     Social History: Social History   Tobacco Use  Smoking Status Former Smoker  . Types: Cigarettes  . Quit date: 10/26/2011  . Years since quitting: 8.8  Smokeless Tobacco Never Used   Social History   Substance and Sexual Activity  Alcohol Use Yes  . Alcohol/week: 2.0 standard drinks  . Types: 2 Cans of beer per week   Comment: social   Social History   Substance and Sexual Activity  Drug Use No    Allergies: No Known Allergies  Medications: Current Outpatient Medications  Medication Sig Dispense Refill  . albuterol (PROVENTIL HFA;VENTOLIN HFA) 108 (90 BASE) MCG/ACT inhaler Inhale 2 puffs into the lungs every 6 (six) hours as needed for wheezing or shortness of breath. For shortness of breath 1 Inhaler 1  . albuterol (PROVENTIL) (2.5 MG/3ML) 0.083% nebulizer solution Take 2.5 mg by nebulization every 6 (six) hours as needed for wheezing or shortness of breath.     . Albuterol Sulfate POWD albuterol sulf 90 mcg/actuation breath activated powder inhaler,sensor  Inhale 2 puffs every 4 hours by inhalation route.    Marland Kitchen amLODipine (NORVASC) 10 MG tablet Take 20 mg by mouth daily.    Marland Kitchen atorvastatin (LIPITOR) 20 MG tablet Take 20 mg by mouth daily.    Marland Kitchen diltiazem (CARDIZEM CD) 240 MG 24  hr capsule Take 240 mg by mouth daily.    Marland Kitchen diltiazem (CARDIZEM) 120 MG tablet Take 120 mg by mouth daily.    . furosemide (LASIX) 20 MG tablet Take 40 mg by mouth daily.    Marland Kitchen lisinopril (PRINIVIL,ZESTRIL) 20 MG tablet Take 1 tablet (20 mg total) by mouth daily. 30 tablet 0  . oxyCODONE-acetaminophen (PERCOCET) 10-325 MG per tablet Take 1-2 tablets by mouth every 4 (four) hours as needed for pain.     . pantoprazole (PROTONIX) 40 MG tablet TAKE ONE TABLET BY MOUTH TWICE A DAY BEFORE A MEAL. 60 tablet 2  . tiotropium (SPIRIVA) 18 MCG inhalation capsule Place 18 mcg into inhaler and inhale daily.     No current facility-administered medications for this visit.    Review of Systems: GENERAL: negative for malaise, night sweats HEENT: No changes in hearing or vision, no nose bleeds or other nasal problems. NECK: Negative for lumps, goiter, pain and significant neck swelling RESPIRATORY: Negative for cough, wheezing CARDIOVASCULAR: Negative for chest pain, leg swelling, palpitations,  orthopnea GI: SEE HPI MUSCULOSKELETAL: Negative for joint pain or swelling, back pain, and muscle pain. SKIN: Negative for lesions, rash PSYCH: Negative for sleep disturbance, mood disorder and recent psychosocial stressors. HEMATOLOGY Negative for prolonged bleeding, bruising easily, and swollen nodes. ENDOCRINE: Negative for cold or heat intolerance, polyuria, polydipsia and goiter. NEURO: negative for tremor, gait imbalance, syncope and seizures. The remainder of the review of systems is noncontributory.   Physical Exam: BP 115/68 (BP Location: Left Arm, Patient Position: Sitting, Cuff Size: Normal)   Pulse 74   Temp 97.8 F (36.6 C) (Oral)   Ht 5\' 4"  (1.626 m)   Wt 151 lb 1.9 oz (68.5 kg)   BMI 25.94 kg/m  GENERAL: The patient is AO x3, in no acute distress. HEENT: Head is normocephalic and atraumatic. EOMI are intact. Mouth is well hydrated and without lesions. NECK: Supple. No masses LUNGS: Clear  to auscultation. No presence of rhonchi/wheezing/rales. Adequate chest expansion HEART: RRR, normal s1 and s2. ABDOMEN: Soft, nontender, no guarding, no peritoneal signs, and nondistended. BS +. No masses. EXTREMITIES: Without any cyanosis, clubbing, rash, lesions or edema. NEUROLOGIC: AOx3, no focal motor deficit. SKIN: no jaundice, no rashes   Imaging/Labs: as above  I personally reviewed and interpreted the available labs, imaging and endoscopic files.  Impression and Plan: [name] is a 55 y.o. female with PMH COPD, FBM, OSA, GERD, ashtma , HTN,HLD depression, who presents for evaluation of positive Cologuard.  The patient has presented occasional episodes of abdominal pain recently but no red flag signs.  She is at average risk for colorectal cancer but given her positive Cologuard testing we will need to proceed with a colonoscopy.  I explained to the patient that it is possible that Cologuard testing can have false positives, and some of the patients may have a positive testing without having a cancer of polyp.  Patient understood and agreed.  She is not interested in taking any medication for her abdominal pain as this is self-limited.  We will prescribe a 2-day bowel prep.  -Schedule  Colonoscopy  All questions were answered.      Maylon Peppers, MD Gastroenterology and Hepatology [facility]

## 2020-09-11 ENCOUNTER — Other Ambulatory Visit (INDEPENDENT_AMBULATORY_CARE_PROVIDER_SITE_OTHER): Payer: Self-pay

## 2020-09-12 ENCOUNTER — Encounter (HOSPITAL_COMMUNITY): Payer: Self-pay | Admitting: Gastroenterology

## 2020-09-15 ENCOUNTER — Other Ambulatory Visit: Payer: Self-pay

## 2020-09-15 ENCOUNTER — Other Ambulatory Visit (HOSPITAL_COMMUNITY)
Admission: RE | Admit: 2020-09-15 | Discharge: 2020-09-15 | Disposition: A | Discharge status: Home / Self Care | Payer: Medicaid Other | Source: Ambulatory Visit | Attending: Gastroenterology | Admitting: Gastroenterology

## 2020-09-15 ENCOUNTER — Encounter (INDEPENDENT_AMBULATORY_CARE_PROVIDER_SITE_OTHER): Payer: Self-pay

## 2020-09-15 DIAGNOSIS — Z20822 Contact with and (suspected) exposure to covid-19: Secondary | ICD-10-CM | POA: Diagnosis not present

## 2020-09-15 DIAGNOSIS — Z01812 Encounter for preprocedural laboratory examination: Secondary | ICD-10-CM | POA: Diagnosis present

## 2020-09-15 LAB — PREGNANCY, URINE: Preg Test, Ur: NEGATIVE

## 2020-09-16 LAB — SARS CORONAVIRUS 2 (TAT 6-24 HRS): SARS Coronavirus 2: NEGATIVE

## 2020-09-19 ENCOUNTER — Ambulatory Visit (HOSPITAL_COMMUNITY): Admission: RE | Admit: 2020-09-19 | Payer: Medicaid Other | Source: Home / Self Care | Admitting: Gastroenterology

## 2020-09-19 SURGERY — COLONOSCOPY WITH PROPOFOL
Anesthesia: Monitor Anesthesia Care

## 2020-09-22 ENCOUNTER — Encounter (INDEPENDENT_AMBULATORY_CARE_PROVIDER_SITE_OTHER): Payer: Self-pay

## 2020-09-22 ENCOUNTER — Other Ambulatory Visit (INDEPENDENT_AMBULATORY_CARE_PROVIDER_SITE_OTHER): Payer: Self-pay

## 2020-09-25 ENCOUNTER — Other Ambulatory Visit: Payer: Self-pay

## 2020-09-25 ENCOUNTER — Other Ambulatory Visit (HOSPITAL_COMMUNITY)
Admission: RE | Admit: 2020-09-25 | Discharge: 2020-09-25 | Disposition: A | Discharge status: Home / Self Care | Payer: Medicaid Other | Source: Ambulatory Visit | Attending: Gastroenterology | Admitting: Gastroenterology

## 2020-09-25 ENCOUNTER — Other Ambulatory Visit (HOSPITAL_COMMUNITY): Payer: Self-pay | Admitting: Internal Medicine

## 2020-09-25 ENCOUNTER — Encounter (INDEPENDENT_AMBULATORY_CARE_PROVIDER_SITE_OTHER): Payer: Self-pay

## 2020-09-25 DIAGNOSIS — Z20822 Contact with and (suspected) exposure to covid-19: Secondary | ICD-10-CM | POA: Insufficient documentation

## 2020-09-25 DIAGNOSIS — Z01812 Encounter for preprocedural laboratory examination: Secondary | ICD-10-CM | POA: Diagnosis not present

## 2020-09-25 DIAGNOSIS — Z1231 Encounter for screening mammogram for malignant neoplasm of breast: Secondary | ICD-10-CM

## 2020-09-26 LAB — SARS CORONAVIRUS 2 (TAT 6-24 HRS): SARS Coronavirus 2: NEGATIVE

## 2020-09-27 ENCOUNTER — Ambulatory Visit (HOSPITAL_COMMUNITY): Payer: Medicaid Other | Admitting: Anesthesiology

## 2020-09-27 ENCOUNTER — Encounter (HOSPITAL_COMMUNITY)
Admission: RE | Disposition: A | Discharge status: Home / Self Care | Payer: Self-pay | Source: Home / Self Care | Attending: Gastroenterology

## 2020-09-27 ENCOUNTER — Other Ambulatory Visit: Payer: Self-pay

## 2020-09-27 ENCOUNTER — Encounter (HOSPITAL_COMMUNITY): Payer: Self-pay | Admitting: Gastroenterology

## 2020-09-27 ENCOUNTER — Ambulatory Visit (HOSPITAL_COMMUNITY)
Admission: RE | Admit: 2020-09-27 | Discharge: 2020-09-27 | Disposition: A | Discharge status: Home / Self Care | Payer: Medicaid Other | Attending: Gastroenterology | Admitting: Gastroenterology

## 2020-09-27 DIAGNOSIS — K219 Gastro-esophageal reflux disease without esophagitis: Secondary | ICD-10-CM | POA: Insufficient documentation

## 2020-09-27 DIAGNOSIS — J449 Chronic obstructive pulmonary disease, unspecified: Secondary | ICD-10-CM | POA: Diagnosis not present

## 2020-09-27 DIAGNOSIS — G4733 Obstructive sleep apnea (adult) (pediatric): Secondary | ICD-10-CM | POA: Insufficient documentation

## 2020-09-27 DIAGNOSIS — D124 Benign neoplasm of descending colon: Secondary | ICD-10-CM

## 2020-09-27 DIAGNOSIS — E785 Hyperlipidemia, unspecified: Secondary | ICD-10-CM | POA: Diagnosis not present

## 2020-09-27 DIAGNOSIS — Z79899 Other long term (current) drug therapy: Secondary | ICD-10-CM | POA: Diagnosis not present

## 2020-09-27 DIAGNOSIS — M549 Dorsalgia, unspecified: Secondary | ICD-10-CM | POA: Diagnosis not present

## 2020-09-27 DIAGNOSIS — Z87891 Personal history of nicotine dependence: Secondary | ICD-10-CM | POA: Insufficient documentation

## 2020-09-27 DIAGNOSIS — I10 Essential (primary) hypertension: Secondary | ICD-10-CM | POA: Insufficient documentation

## 2020-09-27 DIAGNOSIS — R195 Other fecal abnormalities: Secondary | ICD-10-CM

## 2020-09-27 HISTORY — PX: POLYPECTOMY: SHX149

## 2020-09-27 HISTORY — PX: COLONOSCOPY WITH PROPOFOL: SHX5780

## 2020-09-27 LAB — HM COLONOSCOPY

## 2020-09-27 SURGERY — COLONOSCOPY WITH PROPOFOL
Anesthesia: General

## 2020-09-27 MED ORDER — PROPOFOL 500 MG/50ML IV EMUL
INTRAVENOUS | Status: DC | PRN
  Administered 2020-09-27: 150 ug/kg/min via INTRAVENOUS

## 2020-09-27 MED ORDER — LACTATED RINGERS IV SOLN: INTRAVENOUS | Status: DC

## 2020-09-27 MED ORDER — PROPOFOL 10 MG/ML IV BOLUS
INTRAVENOUS | Status: AC
  Filled 2020-09-27: qty 20

## 2020-09-27 MED ORDER — PROPOFOL 10 MG/ML IV BOLUS
INTRAVENOUS | Status: AC
  Filled 2020-09-27: qty 60

## 2020-09-27 MED ORDER — STERILE WATER FOR IRRIGATION IR SOLN
Status: DC | PRN
  Administered 2020-09-27: 200 mL

## 2020-09-27 MED ORDER — EPHEDRINE SULFATE 50 MG/ML IJ SOLN
INTRAMUSCULAR | Status: DC | PRN
  Administered 2020-09-27 (×2): 10 mg via INTRAVENOUS

## 2020-09-27 NOTE — Transfer of Care (Signed)
Immediate Anesthesia Transfer of Care Note  Patient: [name]  Procedure(s) Performed: COLONOSCOPY WITH PROPOFOL (N/A ) POLYPECTOMY INTESTINAL  Patient Location: PACU  Anesthesia Type:General  Level of Consciousness: awake, alert , oriented and patient cooperative  Airway & Oxygen Therapy: Patient Spontanous Breathing  Post-op Assessment: Report given to RN, Post -op Vital signs reviewed and stable and Patient moving all extremities  Post vital signs: Reviewed and stable  Last Vitals:  Vitals Value Taken Time  BP 98/56 09/27/20 1308  Temp 36.8 C 09/27/20 1308  Pulse    Resp 16 09/27/20 1308  SpO2 98 % 09/27/20 1308    Last Pain:  Vitals:   09/27/20 1308  TempSrc: Oral  PainSc: 0-No pain      Patients Stated Pain Goal: 6 (12/82/08 1388)  Complications: No complications documented.

## 2020-09-27 NOTE — Discharge Instructions (Signed)
Restart your previous diet. We will reach you with the result of the pathology. A repeat colonoscopy is recommended, timeframe will depend on the pathology findings.   Colonoscopy, Adult, Care After This sheet gives you information about how to care for yourself after your procedure. Your doctor may also give you more specific instructions. If you have problems or questions, call your doctor. What can I expect after the procedure? After the procedure, it is common to have:  A small amount of blood in your poop (stool) for 24 hours.  Some gas.  Mild cramping or bloating in your belly (abdomen). Follow these instructions at home: Eating and drinking  Drink enough fluid to keep your pee (urine) pale yellow.  Follow instructions from your doctor about what you cannot eat or drink.  Return to your normal diet as told by your doctor. Avoid heavy or fried foods that are hard to digest.   Activity  Rest as told by your doctor.  Do not sit for a long time without moving. Get up to take short walks every 1-2 hours. This is important. Ask for help if you feel weak or unsteady.  Return to your normal activities as told by your doctor. Ask your doctor what activities are safe for you. To help cramping and bloating:  Try walking around.  Put heat on your belly as told by your doctor. Use the heat source that your doctor recommends, such as a moist heat pack or a heating pad. ? Put a towel between your skin and the heat source. ? Leave the heat on for 20-30 minutes. ? Remove the heat if your skin turns bright red. This is very important if you are unable to feel pain, heat, or cold. You may have a greater risk of getting burned.   General instructions  If you were given a medicine to help you relax (sedative) during your procedure, it can affect you for many hours. Do not drive or use machinery until your doctor says that it is safe.  For the first 24 hours after the procedure: ? Do not  sign important documents. ? Do not drink alcohol. ? Do your daily activities more slowly than normal. ? Eat foods that are soft and easy to digest.  Take over-the-counter or prescription medicines only as told by your doctor.  Keep all follow-up visits as told by your doctor. This is important. Contact a doctor if:  You have blood in your poop 2-3 days after the procedure. Get help right away if:  You have more than a small amount of blood in your poop.  You see large clumps of tissue (blood clots) in your poop.  Your belly is swollen.  You feel like you may vomit (nauseous).  You vomit.  You have a fever.  You have belly pain that gets worse, and medicine does not help your pain. Summary  After the procedure, it is common to have a small amount of blood in your poop. You may also have mild cramping and bloating in your belly.  If you were given a medicine to help you relax (sedative) during your procedure, it can affect you for many hours. Do not drive or use machinery until your doctor says that it is safe.  Get help right away if you have a lot of blood in your poop, feel like you may vomit, have a fever, or have more belly pain. This information is not intended to replace advice given to you by  your health care provider. Make sure you discuss any questions you have with your health care provider. Document Revised: 05/07/2019 Document Reviewed: 01/25/2019 Elsevier Patient Education  [place].  Colon Polyps  Colon polyps are tissue growths inside the colon, which is part of the large intestine. They are one of the types of polyps that can grow in the body. A polyp may be a round bump or a mushroom-shaped growth. You could have one polyp or more than one. Most colon polyps are noncancerous (benign). However, some colon polyps can become cancerous over time. Finding and removing the polyps early can help prevent this. What are the causes? The exact cause of colon  polyps is not known. What increases the risk? The following factors may make you more likely to develop this condition:  Having a family history of colorectal cancer or colon polyps.  Being older than 53 years of age.  Being younger than 53 years of age and having a significant family history of colorectal cancer or colon polyps or a genetic condition that puts you at higher risk of getting colon polyps.  Having inflammatory bowel disease, such as ulcerative colitis or Crohn's disease.  Having certain conditions passed from parent to child (hereditary conditions), such as: ? Familial adenomatous polyposis (FAP). ? Lynch syndrome. ? Turcot syndrome. ? Peutz-Jeghers syndrome. ? MUTYH-associated polyposis (MAP).  Being overweight.  Certain lifestyle factors. These include smoking cigarettes, drinking too much alcohol, not getting enough exercise, and eating a diet that is high in fat and red meat and low in fiber.  Having had childhood cancer that was treated with radiation of the abdomen. What are the signs or symptoms? Many times, there are no symptoms. If you have symptoms, they may include:  Blood coming from the rectum during a bowel movement.  Blood in the stool (feces). The blood may be bright red or very dark in color.  Pain in the abdomen.  A change in bowel habits, such as constipation or diarrhea. How is this diagnosed? This condition is diagnosed with a colonoscopy. This is a procedure in which a lighted, flexible scope is inserted into the opening between the buttocks (anus) and then passed into the colon to examine the area. Polyps are sometimes found when a colonoscopy is done as part of routine cancer screening tests. How is this treated? This condition is treated by removing any polyps that are found. Most polyps can be removed during a colonoscopy. Those polyps will then be tested for cancer. Additional treatment may be needed depending on the results of  testing. Follow these instructions at home: Eating and drinking  Eat foods that are high in fiber, such as fruits, vegetables, and whole grains.  Eat foods that are high in calcium and vitamin D, such as milk, cheese, yogurt, eggs, liver, fish, and broccoli.  Limit foods that are high in fat, such as fried foods and desserts.  Limit the amount of red meat, precooked or cured meat, or other processed meat that you eat, such as hot dogs, sausages, bacon, or meat loaves.  Limit sugary drinks.   Lifestyle  Maintain a healthy weight, or lose weight if recommended by your health care provider.  Exercise every day or as told by your health care provider.  Do not use any products that contain nicotine or tobacco, such as cigarettes, e-cigarettes, and chewing tobacco. If you need help quitting, ask your health care provider.  Do not drink alcohol if: ? Your health care  provider tells you not to drink. ? You are pregnant, may be pregnant, or are planning to become pregnant.  If you drink alcohol: ? Limit how much you use to:  0-1 drink a day for women.  0-2 drinks a day for men. ? Know how much alcohol is in your drink. In the [place] one drink equals one 12 oz bottle of beer (355 mL), one 5 oz glass of wine (148 mL), or one 1 oz glass of hard liquor (44 mL). General instructions  Take over-the-counter and prescription medicines only as told by your health care provider.  Keep all follow-up visits. This is important. This includes having regularly scheduled colonoscopies. Talk to your health care provider about when you need a colonoscopy. Contact a health care provider if:  You have new or worsening bleeding during a bowel movement.  You have new or increased blood in your stool.  You have a change in bowel habits.  You lose weight for no known reason. Summary  Colon polyps are tissue growths inside the colon, which is part of the large intestine. They are one type of polyp that  can grow in the body.  Most colon polyps are noncancerous (benign), but some can become cancerous over time.  This condition is diagnosed with a colonoscopy.  This condition is treated by removing any polyps that are found. Most polyps can be removed during a colonoscopy. This information is not intended to replace advice given to you by your health care provider. Make sure you discuss any questions you have with your health care provider. Document Revised: 10/20/2019 Document Reviewed: 10/20/2019 Elsevier Patient Education  2021 [employer].

## 2020-09-27 NOTE — Op Note (Signed)
[facility] Patient Name: [name] Procedure Date: 09/27/2020 12:21 PM MRN: [id number] Date of Birth: June 03, 1966 Attending MD: Maylon Peppers ,  CSN: [id number] Age: 55 Admit Type: Outpatient Procedure:                Colonoscopy Indications:              Positive Cologuard test Providers:                Maylon Peppers, Crystal Page, Paulina Risa Grill, Technician Referring MD:              Medicines:                Monitored Anesthesia Care Complications:            No immediate complications. Estimated Blood Loss:     Estimated blood loss: none. Procedure:                Pre-Anesthesia Assessment:                           - Prior to the procedure, a History and Physical                            was performed, and patient medications, allergies                            and sensitivities were reviewed. The patient's                            tolerance of previous anesthesia was reviewed.                           - The risks and benefits of the procedure and the                            sedation options and risks were discussed with the                            patient. All questions were answered and informed                            consent was obtained.                           - ASA Grade Assessment: II - A patient with mild                            systemic disease.                           After obtaining informed consent, the colonoscope                            was passed under direct vision. Throughout the  procedure, the patient's blood pressure, pulse, and                            oxygen saturations were monitored continuously. The                            [id number]-[id number] ([id number]) scope was introduced through                            the anus and advanced to the the cecum, identified                            by appendiceal orifice and ileocecal valve. The                             colonoscopy was performed without difficulty. The                            patient tolerated the procedure well. The quality                            of the bowel preparation was good. Scope withdrawal                            time was 13 minutes. Scope In: 12:36:08 PM Scope Out: 1:04:10 PM Scope Withdrawal Time: 0 hours 13 minutes 12 seconds  Total Procedure Duration: 0 hours 28 minutes 2 seconds  Findings:      The perianal and digital rectal examinations were normal.      A 12 mm polyp was found in the descending colon. The polyp was       semi-pedunculated. The polyp was removed with a hot snare. Resection and       retrieval were complete.      A 6 mm polyp was found in the descending colon. The polyp was sessile.       The polyp was removed with a cold snare. Resection and retrieval were       complete.      The retroflexed view of the distal rectum and anal verge was normal and       showed no anal or rectal abnormalities. Impression:               - One 12 mm polyp in the descending colon, removed                            with a hot snare. Resected and retrieved.                           - One 6 mm polyp in the descending colon, removed                            with a cold snare. Resected and retrieved.                           - The distal rectum and anal verge are  normal on                            retroflexion view. Moderate Sedation:      Per Anesthesia Care Recommendation:           - Discharge patient to home (ambulatory).                           - Resume previous diet.                           - Await pathology results.                           - Repeat colonoscopy for surveillance based on                            pathology results. Procedure Code(s):        --- Professional ---                           [phone], Colonoscopy, flexible; with removal of                            tumor(s), polyp(s), or other lesion(s) by snare                             technique Diagnosis Code(s):        --- Professional ---                           K63.5, Polyp of colon                           R19.5, Other fecal abnormalities CPT copyright 2019 American Medical Association. All rights reserved. The codes documented in this report are preliminary and upon coder review may  be revised to meet current compliance requirements. Maylon Peppers, MD Maylon Peppers,  09/27/2020 1:13:26 PM This report has been signed electronically. Number of Addenda: 0

## 2020-09-27 NOTE — Interval H&P Note (Signed)
History and Physical Interval Note:  09/27/2020 12:25 PM  [name] is a 55 y.o. female with PMH COPD, FBM, OSA, GERD, ashtma , HTN,HLD depression, who presents for evaluation of positive Cologuard.  Patient denies any complaints at the moment.  States she has been asymptomatic, denies having any abdominal pain, nausea, vomiting, fever, chills, melena or hematochezia.  Never had a colonoscopy in the past.  BP 106/66   Pulse 65   Temp 98.6 F (37 C) (Oral)   Resp 14   Ht 5\' 3"  (1.6 m)   Wt 68 kg   SpO2 96%   BMI 26.57 kg/m  GENERAL: The patient is AO x3, in no acute distress. HEENT: Head is normocephalic and atraumatic. EOMI are intact. Mouth is well hydrated and without lesions. NECK: Supple. No masses LUNGS: Clear to auscultation. No presence of rhonchi/wheezing/rales. Adequate chest expansion HEART: RRR, normal s1 and s2. ABDOMEN: Soft, nontender, no guarding, no peritoneal signs, and nondistended. BS +. No masses. EXTREMITIES: Without any cyanosis, clubbing, rash, lesions or edema. NEUROLOGIC: AOx3, no focal motor deficit. SKIN: no jaundice, no rashes   [name]  has presented today for surgery, with the diagnosis of positive cologuard, Left side abdominal pain.  The various methods of treatment have been discussed with the patient and family. After consideration of risks, benefits and other options for treatment, the patient has consented to  Procedure(s) with comments: COLONOSCOPY WITH PROPOFOL (N/A) - PM as a surgical intervention.  The patient's history has been reviewed, patient examined, no change in status, stable for surgery.  I have reviewed the patient's chart and labs.  Questions were answered to the patient's satisfaction.     Maylon Peppers Mayorga

## 2020-09-27 NOTE — Anesthesia Preprocedure Evaluation (Addendum)
Anesthesia Evaluation  Patient identified by MRN, date of birth, ID band Patient awake    Reviewed: Allergy & Precautions, NPO status , Patient's Chart, lab work & pertinent test results  History of Anesthesia Complications Negative for: history of anesthetic complications  Airway Mallampati: II  TM Distance: >3 FB Neck ROM: Full    Dental  (+) Dental Advisory Given, Missing   Pulmonary shortness of breath and with exertion, asthma , sleep apnea and Continuous Positive Airway Pressure Ventilation , COPD, former smoker,    Pulmonary exam normal breath sounds clear to auscultation       Cardiovascular METS: 3 - Mets hypertension, Pt. on medications Normal cardiovascular exam Rhythm:Regular Rate:Normal     Neuro/Psych PSYCHIATRIC DISORDERS Anxiety Depression  Neuromuscular disease    GI/Hepatic GERD  Medicated and Controlled,(+)     substance abuse  alcohol use,   Endo/Other  negative endocrine ROS  Renal/GU negative Renal ROS     Musculoskeletal  (+) Fibromyalgia -, narcotic dependent  Abdominal   Peds  Hematology negative hematology ROS (+)   Anesthesia Other Findings   Reproductive/Obstetrics                            Anesthesia Physical Anesthesia Plan  ASA: III  Anesthesia Plan: General   Post-op Pain Management:    Induction:   PONV Risk Score and Plan: Propofol infusion and TIVA  Airway Management Planned: Nasal Cannula and Natural Airway  Additional Equipment:   Intra-op Plan:   Post-operative Plan: Extubation in OR  Informed Consent: I have reviewed the patients History and Physical, chart, labs and discussed the procedure including the risks, benefits and alternatives for the proposed anesthesia with the patient or authorized representative who has indicated his/her understanding and acceptance.       Plan Discussed with: CRNA and Surgeon  Anesthesia Plan  Comments:        Anesthesia Quick Evaluation

## 2020-09-27 NOTE — Anesthesia Postprocedure Evaluation (Signed)
Anesthesia Post Note  Patient: [name]  Procedure(s) Performed: COLONOSCOPY WITH PROPOFOL (N/A ) POLYPECTOMY INTESTINAL  Patient location during evaluation: Phase II Anesthesia Type: General Level of consciousness: awake, oriented, awake and alert and patient cooperative Pain management: satisfactory to patient Vital Signs Assessment: vitals unstable and post-procedure vital signs reviewed and stable Respiratory status: spontaneous breathing, respiratory function stable and nonlabored ventilation Cardiovascular status: stable Postop Assessment: no apparent nausea or vomiting Anesthetic complications: no   No complications documented.   Last Vitals:  Vitals:   09/27/20 1204 09/27/20 1308  BP: 106/66 (!) 98/56  Pulse: 65   Resp: 14 16  Temp: 37 C 36.8 C  SpO2: 96% 98%    Last Pain:  Vitals:   09/27/20 1308  TempSrc: Oral  PainSc: 0-No pain                 Aldina Porta

## 2020-09-29 LAB — SURGICAL PATHOLOGY

## 2020-10-02 ENCOUNTER — Encounter (INDEPENDENT_AMBULATORY_CARE_PROVIDER_SITE_OTHER): Payer: Self-pay | Admitting: *Deleted

## 2020-10-04 ENCOUNTER — Other Ambulatory Visit: Payer: Self-pay | Admitting: Internal Medicine

## 2020-10-04 ENCOUNTER — Encounter (HOSPITAL_COMMUNITY): Payer: Self-pay | Admitting: Gastroenterology

## 2020-10-04 ENCOUNTER — Other Ambulatory Visit (HOSPITAL_COMMUNITY): Payer: Self-pay | Admitting: Internal Medicine

## 2020-10-04 DIAGNOSIS — M5414 Radiculopathy, thoracic region: Secondary | ICD-10-CM

## 2020-10-09 ENCOUNTER — Ambulatory Visit (HOSPITAL_COMMUNITY)
Admission: RE | Admit: 2020-10-09 | Discharge: 2020-10-09 | Disposition: A | Discharge status: Home / Self Care | Payer: Medicaid Other | Source: Ambulatory Visit | Attending: Internal Medicine | Admitting: Internal Medicine

## 2020-10-09 DIAGNOSIS — Z1231 Encounter for screening mammogram for malignant neoplasm of breast: Secondary | ICD-10-CM | POA: Diagnosis present

## 2020-10-19 ENCOUNTER — Other Ambulatory Visit: Payer: Self-pay

## 2020-10-19 ENCOUNTER — Ambulatory Visit (HOSPITAL_COMMUNITY)
Admission: RE | Admit: 2020-10-19 | Discharge: 2020-10-19 | Disposition: A | Discharge status: Home / Self Care | Payer: Medicaid Other | Source: Ambulatory Visit | Attending: Internal Medicine | Admitting: Internal Medicine

## 2020-10-19 DIAGNOSIS — M5414 Radiculopathy, thoracic region: Secondary | ICD-10-CM | POA: Diagnosis present

## 2020-11-02 ENCOUNTER — Ambulatory Visit: Payer: Medicaid Other | Admitting: Neurology

## 2020-11-05 ENCOUNTER — Emergency Department (HOSPITAL_COMMUNITY): Payer: Medicaid Other

## 2020-11-05 ENCOUNTER — Other Ambulatory Visit: Payer: Self-pay

## 2020-11-05 ENCOUNTER — Encounter (HOSPITAL_COMMUNITY): Payer: Self-pay | Admitting: Emergency Medicine

## 2020-11-05 ENCOUNTER — Emergency Department (HOSPITAL_COMMUNITY)
Admission: EM | Admit: 2020-11-05 | Discharge: 2020-11-05 | Disposition: A | Discharge status: Home / Self Care | Payer: Medicaid Other | Attending: Emergency Medicine | Admitting: Emergency Medicine

## 2020-11-05 DIAGNOSIS — I10 Essential (primary) hypertension: Secondary | ICD-10-CM | POA: Insufficient documentation

## 2020-11-05 DIAGNOSIS — Z79899 Other long term (current) drug therapy: Secondary | ICD-10-CM | POA: Diagnosis not present

## 2020-11-05 DIAGNOSIS — J3489 Other specified disorders of nose and nasal sinuses: Secondary | ICD-10-CM | POA: Diagnosis not present

## 2020-11-05 DIAGNOSIS — R062 Wheezing: Secondary | ICD-10-CM | POA: Insufficient documentation

## 2020-11-05 DIAGNOSIS — J449 Chronic obstructive pulmonary disease, unspecified: Secondary | ICD-10-CM | POA: Diagnosis not present

## 2020-11-05 DIAGNOSIS — R0981 Nasal congestion: Secondary | ICD-10-CM | POA: Diagnosis not present

## 2020-11-05 DIAGNOSIS — H9203 Otalgia, bilateral: Secondary | ICD-10-CM | POA: Insufficient documentation

## 2020-11-05 DIAGNOSIS — Z87891 Personal history of nicotine dependence: Secondary | ICD-10-CM | POA: Insufficient documentation

## 2020-11-05 DIAGNOSIS — R059 Cough, unspecified: Secondary | ICD-10-CM | POA: Diagnosis not present

## 2020-11-05 DIAGNOSIS — H7290 Unspecified perforation of tympanic membrane, unspecified ear: Secondary | ICD-10-CM

## 2020-11-05 MED ORDER — OXYCODONE-ACETAMINOPHEN 5-325 MG PO TABS
1.0000 | ORAL_TABLET | Freq: Once | ORAL | Status: AC
  Administered 2020-11-05: 1 via ORAL
  Filled 2020-11-05: qty 1

## 2020-11-05 MED ORDER — ALBUTEROL SULFATE HFA 108 (90 BASE) MCG/ACT IN AERS
2.0000 | INHALATION_SPRAY | Freq: Once | RESPIRATORY_TRACT | Status: AC
  Administered 2020-11-05: 2 via RESPIRATORY_TRACT
  Filled 2020-11-05: qty 6.7

## 2020-11-05 MED ORDER — AMOXICILLIN 500 MG PO CAPS: 500.0000 mg | ORAL_CAPSULE | Freq: Three times a day (TID) | ORAL | 0 refills | Status: DC

## 2020-11-05 MED ORDER — AMOXICILLIN 250 MG PO CAPS
500.0000 mg | ORAL_CAPSULE | Freq: Once | ORAL | Status: AC
  Administered 2020-11-05: 500 mg via ORAL
  Filled 2020-11-05: qty 2

## 2020-11-05 MED ORDER — NEOMYCIN-POLYMYXIN-HC 3.5-10000-1 OT SUSP: 4.0000 [drp] | Freq: Three times a day (TID) | OTIC | 0 refills | Status: DC

## 2020-11-05 MED ORDER — NEOMYCIN-POLYMYXIN-HC 3.5-10000-1 OT SUSP: 4.0000 [drp] | Freq: Three times a day (TID) | OTIC | 0 refills | Status: AC

## 2020-11-05 NOTE — Discharge Instructions (Signed)
Take the antibiotics as prescribed for possible sinus and ear infection.  Follow-up with your primary doctor in the ENT doctor.  Return to the ED with difficulty breathing, difficulty swallowing, any other concerns.

## 2020-11-05 NOTE — ED Triage Notes (Signed)
Pt states she is having bilateral ear pain that started yesterday. Pt states she is having difficulty hearing as a result of them feeling "stopped up". States they "crack and pop". Pt also has productive cough with yellow sputum. Pt tearful in triage.

## 2020-11-05 NOTE — ED Provider Notes (Signed)
[facility] EMERGENCY DEPARTMENT Provider Note   CSN: [id number] Arrival date & time: 11/05/20  0448     History Chief Complaint  Patient presents with  . Otalgia    [name] is a 55 y.o. female.  Patient with history of COPD, anxiety, fibromyalgia, sleep apnea here with bilateral ear pain that onset yesterday.  Started on the right and now is in the left.  Feels pressure in her ears, popping sensation and decreased hearing.  States they have been cracking and popping.  Has some facial pain as well.  Has a cough productive of sputum but states she always has them from her COPD.  No chest pain or shortness of breath.  No fever.  Denies any ear trauma.  She did have a bite wound to her right ear remotely but nothing recent.  Denies putting anything in her ears. Denies any difficulty breathing or difficulty swallowing.  No bleeding or drainage  The history is provided by the patient.  Otalgia Associated symptoms: congestion, cough and rhinorrhea   Associated symptoms: no abdominal pain, no ear discharge, no fever, no headaches, no rash and no vomiting        Past Medical History:  Diagnosis Date  . Anxiety   . Anxiety disorder   . Asthma   . Chronic pain syndrome   . COPD (chronic obstructive pulmonary disease) ([place])   . Depression   . Epigastric pain 03/29/2013  . Fatigue 03/29/2013  . Fibromyalgia   . GERD (gastroesophageal reflux disease)   . Hypertension   . Shortness of breath dyspnea   . Sleep apnea    not using Bipap right now due to ear wound    Patient Active Problem List   Diagnosis Date Noted  . Positive colorectal cancer screening using Cologuard test 09/04/2020  . Abdominal pain, left lower quadrant 09/04/2020  . 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 Sleep apnea treated with nocturnal BiPAP 11/03/2019  . Obstructive sleep apnea treated with BiPAP 02/06/2017  . Vomiting 02/09/2015  . Nausea & vomiting 02/09/2015  . Burst fracture of lumbar  vertebra ([place]) 09/22/2014  . Fall   . Compression fracture of L1 lumbar vertebra ([facility]) 06/25/2014  . Chest pain 02/22/2014  . Chest pain at rest 02/22/2014  . Abdominal pain, other specified site 04/29/2013  . Back pain 04/29/2013  . Epigastric pain 03/29/2013  . Fatigue 03/29/2013  . Dysphagia, unspecified(787.20) 09/24/2012  . COPD exacerbation ([place]) 08/29/2011  . Musculoskeletal chest pain 08/29/2011  . Hypokalemia 08/29/2011  . Asthma 07/28/2011  . Chronic obstructive asthma with exacerbation ([place]) 07/28/2011  . COPD (chronic obstructive pulmonary disease) ([place])   . Hypertension   . GERD (gastroesophageal reflux disease)   . Anxiety disorder   . Chronic pain syndrome     Past Surgical History:  Procedure Laterality Date  . BALLOON DILATION N/A 12/31/2013   Procedure: BALLOON DILATION;  Surgeon: Rogene Houston, MD;  [facility];  Service: Endoscopy;  Laterality: N/A;  . CARPAL TUNNEL RELEASE    . CARPAL TUNNEL RELEASE  07/23/2012   Procedure: CARPAL TUNNEL RELEASE;  Surgeon: Sanjuana Kava, MD;  Location: [facility];  Service: Orthopedics;  Laterality: Left;  . CESAREAN SECTION    . CHOLECYSTECTOMY    . COLONOSCOPY WITH PROPOFOL N/A 09/27/2020   Procedure: COLONOSCOPY WITH PROPOFOL;  Surgeon: Harvel Quale, MD;  Location: [facility];  Service: Gastroenterology;  Laterality: N/A;  PM  . cyst removed Left  had surgery to removed cyst  . ENDOMETRIAL ABLATION    . ESOPHAGOGASTRODUODENOSCOPY  07/31/2011   Procedure: ESOPHAGOGASTRODUODENOSCOPY (EGD);  Surgeon: Rogene Houston, MD;  [facility];  Service: Endoscopy;  Laterality: N/A;  . ESOPHAGOGASTRODUODENOSCOPY N/A 12/31/2013   Procedure: ESOPHAGOGASTRODUODENOSCOPY (EGD);  Surgeon: Rogene Houston, MD;  [facility];  Service: Endoscopy;  Laterality: N/A;  120-moved to [address] to notify pt  . ESOPHAGOGASTRODUODENOSCOPY (EGD) WITH ESOPHAGEAL DILATION N/A 09/30/2012   Procedure:  ESOPHAGOGASTRODUODENOSCOPY (EGD) WITH ESOPHAGEAL DILATION;  Surgeon: Rogene Houston, MD;  [facility];  Service: Endoscopy;  Laterality: N/A;  245  . KNEE SURGERY Left   . MALONEY DILATION  07/31/2011   Procedure: MALONEY DILATION;  Surgeon: Rogene Houston, MD;  [facility];  Service: Endoscopy;;  . Venia Minks DILATION N/A 12/31/2013   Procedure: Venia Minks DILATION;  Surgeon: Rogene Houston, MD;  [facility];  Service: Endoscopy;  Laterality: N/A;  . MASS EXCISION  05/20/2012   Procedure: EXCISION MASS;  Surgeon: Sanjuana Kava, MD;  Location: [facility];  Service: Orthopedics;  Laterality: Left;  Repair of Fascial Defect Left Knee  . OTOPLASATY Right 09/09/2017   Procedure: POST AURICULAR FLAP TO RIGHT EAR CARTILAGE GRAFT TO RIGHT EAR FROM RIGHT  EAR;  Surgeon: Irene Limbo, MD;  Location: [place];  Service: Plastics;  Laterality: Right;  . OTOPLASATY Right 10/03/2017   Procedure: ADJACENT TISSUE TRANSFER RIGHT EAR, CARTILAGE GRAFT FROM LEFT TO RIGHT EAR, FULL THICKNESS SKIN GRAFT FROM LEFT POSTAURICULAR SULCUS TO RIGHT EAR;  Surgeon: Irene Limbo, MD;  Location: [place];  Service: Plastics;  Laterality: Right;  . POLYPECTOMY  09/27/2020   Procedure: POLYPECTOMY INTESTINAL;  Surgeon: Harvel Quale, MD;  [facility];  Service: Gastroenterology;;  . Azzie Almas DILATION N/A 12/31/2013   Procedure: Azzie Almas DILATION;  Surgeon: Rogene Houston, MD;  [facility];  Service: Endoscopy;  Laterality: N/A;  . SPINE SURGERY       OB History    Gravida  2   Para  2   Term      Preterm      AB      Living  1     SAB      IAB      Ectopic      Multiple      Live Births  2           Family History  Problem Relation Age of Onset  . Asthma Mother   . Asthma Father   . Cancer Father     Social History   Tobacco Use  . Smoking status: Former Smoker    Types: Cigarettes    Quit  date: 10/26/2011    Years since quitting: 9.0  . Smokeless tobacco: Never Used  Vaping Use  . Vaping Use: Never used  Substance Use Topics  . Alcohol use: Yes    Alcohol/week: 2.0 standard drinks    Types: 2 Cans of beer per week    Comment: social  . Drug use: No    Home Medications Prior to Admission medications   Medication Sig Start Date End Date Taking? Authorizing Provider  albuterol (PROVENTIL HFA;VENTOLIN HFA) 108 (90 BASE) MCG/ACT inhaler Inhale 2 puffs into the lungs every 6 (six) hours as needed for wheezing or shortness of breath. For shortness of breath Patient taking differently: Inhale 2 puffs into the lungs every  6 (six) hours as needed for wheezing or shortness of breath. 07/31/11   Kathie Dike, MD  albuterol (PROVENTIL) (2.5 MG/3ML) 0.083% nebulizer solution Take 2.5 mg by nebulization every 6 (six) hours as needed for wheezing or shortness of breath.     [provider]  amLODipine (NORVASC) 10 MG tablet Take 20 mg by mouth daily.    [provider]  atorvastatin (LIPITOR) 20 MG tablet Take 20 mg by mouth daily.    [provider]  Coenzyme Q10 (CO Q 10) 100 MG CAPS Take 100 mg by mouth daily.    [provider]  diltiazem (CARDIZEM CD) 120 MG 24 hr capsule Take 120 mg by mouth daily. Take with 240 mg for a total of 360 mg 09/11/20   [provider]  diltiazem (CARDIZEM LA) 240 MG 24 hr tablet Take 240 mg by mouth daily. Take with 120 mg for a total of 360 mg    [provider]  furosemide (LASIX) 20 MG tablet Take 20 mg by mouth daily.    [provider]  lisinopril (PRINIVIL,ZESTRIL) 20 MG tablet Take 1 tablet (20 mg total) by mouth daily. Patient taking differently: Take 40 mg by mouth daily. 07/31/11   Kathie Dike, MD  nicotine (NICODERM CQ - DOSED IN MG/24 HOURS) 21 mg/24hr patch Place 21 mg onto the skin daily. 08/24/20   [provider]  oxyCODONE-acetaminophen (PERCOCET) 10-325 MG per  tablet Take 1-2 tablets by mouth every 4 (four) hours as needed for pain.     [provider]  pantoprazole (PROTONIX) 40 MG tablet TAKE ONE TABLET BY MOUTH TWICE A DAY BEFORE A MEAL. Patient taking differently: Take 40 mg by mouth 2 (two) times daily. 11/03/19   Rogene Houston, MD  tiotropium (SPIRIVA) 18 MCG inhalation capsule Place 18 mcg into inhaler and inhale at bedtime.    [provider]  triamcinolone (KENALOG) 0.1 % Apply 1 application topically 2 (two) times daily. 08/09/20   [provider]    Allergies    Patient has no known allergies.  Review of Systems   Review of Systems  Constitutional: Negative for activity change, appetite change and fever.  HENT: Positive for congestion, ear pain and rhinorrhea. Negative for ear discharge and trouble swallowing.   Respiratory: Positive for cough. Negative for chest tightness and shortness of breath.   Cardiovascular: Negative for chest pain.  Gastrointestinal: Negative for abdominal pain, nausea and vomiting.  Genitourinary: Negative for dysuria and hematuria.  Musculoskeletal: Negative for arthralgias and myalgias.  Skin: Negative for rash.  Neurological: Negative for dizziness, weakness and headaches.   all other systems are negative except as noted in the HPI and PMH.    Physical Exam Updated Vital Signs BP 127/77 (BP Location: Left Arm)   Pulse 85   Temp 98.7 F (37.1 C) (Oral)   Resp 18   Ht 5\' 3"  (1.6 m)   Wt 70.3 kg   SpO2 96%   BMI 27.46 kg/m   Physical Exam Vitals and nursing note reviewed.  Constitutional:      General: She is not in acute distress.    Appearance: She is well-developed. She is not ill-appearing.  HENT:     Head: Normocephalic and atraumatic.     Right Ear: Tympanic membrane normal. There is no impacted cerumen.     Left Ear: Tympanic membrane normal. There is no impacted cerumen.     Ears:     Comments: TMs appear  dull bilaterally.  No bulging or erythema.  No  tragus or mastoid pain. Scant amount of blood seen on the superior portion of right TM but TM appears to be intact.  Chronic right ear deformity    Mouth/Throat:     Pharynx: No oropharyngeal exudate or posterior oropharyngeal erythema.     Comments: Frontal and maxillary sinus tenderness Eyes:     Conjunctiva/sclera: Conjunctivae normal.     Pupils: Pupils are equal, round, and reactive to light.  Neck:     Comments: No meningismus. Cardiovascular:     Rate and Rhythm: Normal rate and regular rhythm.     Heart sounds: Normal heart sounds. No murmur heard.   Pulmonary:     Effort: Pulmonary effort is normal. No respiratory distress.     Breath sounds: Wheezing present.  Chest:     Chest wall: No tenderness.  Abdominal:     Palpations: Abdomen is soft.     Tenderness: There is no abdominal tenderness. There is no guarding or rebound.  Musculoskeletal:        General: No tenderness. Normal range of motion.     Cervical back: Normal range of motion and neck supple.  Skin:    General: Skin is warm.  Neurological:     Mental Status: She is alert and oriented to person, place, and time.     Cranial Nerves: No cranial nerve deficit.     Motor: No abnormal muscle tone.     Coordination: Coordination normal.     Comments:  5/5 strength throughout. CN 2-12 intact.Equal grip strength.   Psychiatric:        Behavior: Behavior normal.     ED Results / Procedures / Treatments   Labs (all labs ordered are listed, but only abnormal results are displayed) Labs Reviewed - No data to display  EKG None  Radiology DG Chest Portable 1 View  Result Date: 11/05/2020 CLINICAL DATA:  Cough. EXAM: PORTABLE CHEST 1 VIEW COMPARISON:  Chest x-ray 03/26/2017, CT chest 02/09/2015 FINDINGS: The heart size and mediastinal contours are within normal limits. Biapical pleural/pulmonary scarring. No focal consolidation. Slightly increased interstitial markings with no overt pulmonary edema. No pleural  effusion. No pneumothorax. No acute osseous abnormality. Surgical hardware of the thoracolumbar spine. IMPRESSION: No active disease. Electronically Signed   By: Iven Finn M.D.   On: 11/05/2020 05:32    Procedures Procedures   Medications Ordered in ED Medications  albuterol (VENTOLIN HFA) 108 (90 Base) MCG/ACT inhaler 2 puff (has no administration in time range)  amoxicillin (AMOXIL) capsule 500 mg (has no administration in time range)    ED Course  I have reviewed the triage vital signs and the nursing notes.  Pertinent labs & imaging results that were available during my care of the patient were reviewed by me and considered in my medical decision making (see chart for details).    MDM Rules/Calculators/A&P                          Bilateral ear pain.  No cerumen impaction.  TMs appear dull.  No bulging or erythema.  Does have notable maxillary sinus tenderness.  Wheezing. No hypoxia. Albuterol given. CXR in triage is negative.   Will treat for possible sinusitis and otitis with PO amoxicillin.  Topical antibiotics given as well given blood on R TM and concern for possible perforation.   Followup with ENT. Return precautions discussed.   Addendum: After  discharge, patient stated "blood coming from L ear". On reexam there is blood in L ear canal. TM not visible. Patient denies putting anything in ear. Exam significantly different from initial exam. Concern for TM perforation. Advised otic drops bilaterally. followup with ENT asap. Final Clinical Impression(s) / ED Diagnoses Final diagnoses:  Acute ear pain, bilateral    Rx / DC Orders ED Discharge Orders    None       Levent Kornegay, Annie Main, MD 11/05/20 [phone]

## 2020-12-07 ENCOUNTER — Telehealth: Payer: Self-pay | Admitting: *Deleted

## 2020-12-07 NOTE — Telephone Encounter (Signed)
Faxed order to [facility] for CPAP supplies for patient with fax confirmation received.

## 2021-01-08 ENCOUNTER — Other Ambulatory Visit: Payer: Self-pay

## 2021-01-08 ENCOUNTER — Encounter: Payer: Self-pay | Admitting: Neurology

## 2021-01-08 ENCOUNTER — Ambulatory Visit: Payer: Medicaid Other | Admitting: Neurology

## 2021-01-08 VITALS — BP 116/73 | HR 69 | Ht 64.0 in | Wt 151.0 lb

## 2021-01-08 DIAGNOSIS — G4733 Obstructive sleep apnea (adult) (pediatric): Secondary | ICD-10-CM | POA: Diagnosis not present

## 2021-01-08 DIAGNOSIS — G4731 Primary central sleep apnea: Secondary | ICD-10-CM | POA: Diagnosis not present

## 2021-01-08 NOTE — Patient Instructions (Signed)
It was nice to see you again today.  I am sorry to hear that you are having a stressful time right now.    You are very compliant with your BiPAP, keep up the good work!    As discussed, you should be eligible for a new machine, I will write for new BiPAP machine and we will fax the order to your DME company, [employer].  I have also written for new supplies.    We will try to get you a new BiPAP machine without having to do additional testing.  If you decide to delay getting a new machine, we can see you routinely in 1 year, you can see Debbora Presto, NP again.     If you do start treatment with your new machine, you will need to see [place] in 61 to 89 days after starting the new machine.  We will have to make an appointment in between for that.    Please continue using your BiPAP regularly. While your insurance requires that you use BiPAP at least 4 hours each night on 70% of the nights, I recommend, that you not skip any nights and use it throughout the night if you can. Getting used to BiPAP and staying with the treatment long term does take time and patience and discipline. Untreated obstructive sleep apnea when it is moderate to severe can have an adverse impact on cardiovascular health and raise her risk for heart disease, arrhythmias, hypertension, congestive heart failure, stroke and diabetes. Untreated obstructive sleep apnea causes sleep disruption, nonrestorative sleep, and sleep deprivation. This can have an impact on your day to day functioning and cause daytime sleepiness and impairment of cognitive function, memory loss, mood disturbance, and problems focussing. Using BiPAP regularly can improve these symptoms.

## 2021-01-08 NOTE — Progress Notes (Signed)
Subjective:    Patient ID: [name] is a 54 y.o. female.  HPI    Interim history:   [name] is a 54-year-old right-handed woman with an underlying medical history of asthma, COPD, hypertension, reflux disease, chronic pain on chronic narcotic pain medication, Hx of ear avulsion and s/p surgery, anxiety, depression, fibromyalgia, and overweight state, who presents for follow-up consultation of her sleep apnea, on treatment with BiPAP.  The patient is unaccompanied today.  I last saw her on 02/23/2018, at which time she was tolerating her BiPAP.  She was using a nasal mask.  She had plastic surgery for her right ear.  I increased her pressure from 14/10 to 16/12 at the time due to an AHI of around 16/h.  She saw Carolyn Martin, NP, in the interim on 07/21/18.   She saw Amy Lomax, nurse practitioner in the interim on 10/21/2018 and on 12/02/2018.  She saw Amy Lomax, NP on 02/17/2019, at which time her AHI was elevated around 17/h.  She saw Amy Lomax, NP on 11/03/2019, at which time her AHI was improved at 8.9/h.  Today, 01/08/2021: I reviewed her BiPAP compliance data from 12/05/2020 through 01/03/2021, which is a total of 30 days, during which time she used her machine 29 days with percent use days greater than 4 hours at 97%, indicating excellent compliance with an average usage of 8 hours and 1 minute, residual AHI at goal at 2.1/h, leak on the high side with a 95th percentile at 31.9 L/min on a pressure of 16/12 centimeters.  She reports doing well with her BiPAP, she continues to use it faithfully and benefits from it.  She has had recent stressors in the past year, has been taking care of her mom who had a stroke.  She stays with her mom and alternates care with her sister.  She has had allergy flareup since her daughter has a bunny.  She has had some wheezing.  She also had a recent ear infection on the left.  She needs new supplies.  She would like to be able to get a new machine as she  should be eligible. . The patient is unaccompanied today. I first met her on Today, 02/23/2018: She reports tolerating BiPAP well. She uses a nasal mask. She had interim plastic surgery about 3 times altogether to her right ear. She has done fairly well. She reported sleeping well with BiPAP and has adapted well to treatment overall. Of note, she continues to take narcotic pain medication, oxycodone 10 mg strength 4 times a day on average.   The patient's allergies, current medications, family history, past medical history, past social history, past surgical history and problem list were reviewed and updated as appropriate.    Previously:  I saw her on 08/12/2016 at the request of her primary care physician, at which time she reported snoring and daytime somnolence with an Epworth sleepiness score of 17 at the time. She was advised to proceed with sleep study testing. She had a baseline sleep study, followed by a CPAP titration study.   She saw Carolyn Martin, NP, in the interim on 06/12/2017, at which time she was able to be compliant with her BiPAP with a compliance percentage for more than 4 hours of 77%, her residual AHI was about 15 and her BiPAP pressure was increased at the time.   I reviewed her BiPAP compliance data from 01/20/2018 through 02/18/2018 which is a total of 30 days, during which time   she used her machine 29 days with percent used days greater than 4 hours at 93%, indicating excellent compliance with an average usage of 6 hours and 54 minutes, residual AHI suboptimal at 16 per hour secondary to primarily central events with a central apnea index of 10.9 per hour. Pressure at 14/10 cm, leak acceptable with the 95th percentile at 17 L/m.    08/12/2016: (She) reports snoring and excessive daytime somnolence. Snoring is reportedly loud at times and she has witnessed apneic pauses while asleep. I reviewed your office note from 06/12/2016, which you kindly included. She is on prn  Valium. Her Epworth sleepiness score is 17 out of 24, her fatigue score is 29 out of 63. She denies falling asleep at the wheel. She does not like to nap, but may doze off easily when sedentary. She stopped the Celexa and remeron on her own. She is afraid of SEs. She had back surgery on 09/22/14 and I reviewed the L spine Xray from 09/22/14: IMPRESSION: Intraoperative fluoroscopy utilized for surgical ventral purposes, demonstrating posterior fixation from T11 through L3 with spacer at the anteriorly compressed L1.   She had CT L spine on 08/14/14 and I reviewed the results: IMPRESSION: Further collapse of the chronic L1 compression fracture, now with 50% loss of vertebral body height. This can be seen in normal healing with resorbtion around the fracture planes but can also be associated with posttraumatic osteonecrosis of the vertebral body (Kummel disease).   Her bedtime is between 9 and 10 PM, wakeup time is 6:10 a.m. She lives with her boyfriend and 8-year-old daughter. They have 1 dog in the household, not in the bedroom typically. She does not watch TV in the bedroom. She has difficulty maintaining sleep and often wakes up around 3 AM and has difficulty going back to sleep. She has significant nocturia, 3-4 times per average night. She denies morning headaches. She has gained weight. Her mother has sleep apnea and uses a CPAP machine. The patient quit smoking 5 years ago, she drinks alcohol a few times a week, typically no caffeine on a daily basis. She has some restlessness in her arms at times. She has some residual weakness in her left leg. 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    Her Past Medical History Is Significant For: Past Medical History:  Diagnosis Date   Anxiety    Anxiety disorder    Asthma    Chronic pain syndrome    COPD (chronic obstructive pulmonary disease) (HCC)    Depression    Epigastric pain 03/29/2013   Fatigue 03/29/2013    Fibromyalgia    GERD (gastroesophageal reflux disease)    Hypertension    Shortness of breath dyspnea    Sleep apnea    not using Bipap right now due to ear wound    Her Past Surgical History Is Significant For: Past Surgical History:  Procedure Laterality Date   BALLOON DILATION N/A 12/31/2013   Procedure: BALLOON DILATION;  Surgeon: Najeeb U Rehman, MD;  [facility];  Service: Endoscopy;  Laterality: N/A;   CARPAL TUNNEL RELEASE     CARPAL TUNNEL RELEASE  07/23/2012   Procedure: CARPAL TUNNEL RELEASE;  Surgeon: Wayne Keeling, MD;  Location: [facility];  Service: Orthopedics;  Laterality: Left;   CESAREAN SECTION     CHOLECYSTECTOMY     COLONOSCOPY WITH PROPOFOL N/A 09/27/2020   Procedure: COLONOSCOPY WITH PROPOFOL;  Surgeon: Castaneda Mayorga, Daniel, MD;    Location: [facility];  Service: Gastroenterology;  Laterality: N/A;  PM   cyst removed Left    had surgery to removed cyst   ENDOMETRIAL ABLATION     ESOPHAGOGASTRODUODENOSCOPY  07/31/2011   Procedure: ESOPHAGOGASTRODUODENOSCOPY (EGD);  Surgeon: Najeeb U Rehman, MD;  [facility]: [facility];  Service: Endoscopy;  Laterality: N/A;   ESOPHAGOGASTRODUODENOSCOPY N/A 12/31/2013   Procedure: ESOPHAGOGASTRODUODENOSCOPY (EGD);  Surgeon: Najeeb U Rehman, MD;  [facility]: [facility];  Service: Endoscopy;  Laterality: N/A;  120-moved to 925 Ann to notify pt   ESOPHAGOGASTRODUODENOSCOPY (EGD) WITH ESOPHAGEAL DILATION N/A 09/30/2012   Procedure: ESOPHAGOGASTRODUODENOSCOPY (EGD) WITH ESOPHAGEAL DILATION;  Surgeon: Najeeb U Rehman, MD;  [facility]: [facility];  Service: Endoscopy;  Laterality: N/A;  245   KNEE SURGERY Left    MALONEY DILATION  07/31/2011   Procedure: MALONEY DILATION;  Surgeon: Najeeb U Rehman, MD;  [facility]: [facility];  Service: Endoscopy;;   MALONEY DILATION N/A 12/31/2013   Procedure: MALONEY DILATION;  Surgeon: Najeeb U Rehman, MD;  [facility]: [facility];  Service: Endoscopy;  Laterality: N/A;   MASS EXCISION   05/20/2012   Procedure: EXCISION MASS;  Surgeon: Wayne Keeling, MD;  Location: [facility];  Service: Orthopedics;  Laterality: Left;  Repair of Fascial Defect Left Knee   OTOPLASATY Right 09/09/2017   Procedure: POST AURICULAR FLAP TO RIGHT EAR CARTILAGE GRAFT TO RIGHT EAR FROM RIGHT  EAR;  Surgeon: Thimmappa, Brinda, MD;  Location: [facility];  Service: Plastics;  Laterality: Right;   OTOPLASATY Right 10/03/2017   Procedure: ADJACENT TISSUE TRANSFER RIGHT EAR, CARTILAGE GRAFT FROM LEFT TO RIGHT EAR, FULL THICKNESS SKIN GRAFT FROM LEFT POSTAURICULAR SULCUS TO RIGHT EAR;  Surgeon: Thimmappa, Brinda, MD;  Location: [facility];  Service: Plastics;  Laterality: Right;   POLYPECTOMY  09/27/2020   Procedure: POLYPECTOMY INTESTINAL;  Surgeon: Castaneda Mayorga, Daniel, MD;  [facility];  Service: Gastroenterology;;   SAVORY DILATION N/A 12/31/2013   Procedure: SAVORY DILATION;  Surgeon: Najeeb U Rehman, MD;  [facility];  Service: Endoscopy;  Laterality: N/A;   SPINE SURGERY      Her Family History Is Significant For: Family History  Problem Relation Age of Onset   Asthma Mother    Asthma Father    Cancer Father     Her Social History Is Significant For: Social History   Socioeconomic History   Marital status: Single    Spouse name: Not on file   Number of children: Not on file   Years of education: Not on file   Highest education level: Not on file  Occupational History   Occupation: N/A  Tobacco Use   Smoking status: Former    Pack years: 0.00    Types: Cigarettes    Quit date: 10/26/2011    Years since quitting: 9.2   Smokeless tobacco: Never  Vaping Use   Vaping Use: Never used  Substance and Sexual Activity   Alcohol use: Yes    Alcohol/week: 2.0 standard drinks    Types: 2 Cans of beer per week    Comment: social   Drug use: No   Sexual activity: Yes    Birth control/protection: None, Surgical    Comment: ablation  Other  Topics Concern   Not on file  Social History Narrative   Denies any caffeine use    Social Determinants of Health   Financial Resource Strain: Not on file  Food Insecurity: Not on file    Transportation Needs: Not on file  Physical Activity: Not on file  Stress: Not on file  Social Connections: Not on file    Her Allergies Are:  No Known Allergies:   Her Current Medications Are:  Outpatient Encounter Medications as of 01/08/2021  Medication Sig   albuterol (PROVENTIL HFA;VENTOLIN HFA) 108 (90 BASE) MCG/ACT inhaler Inhale 2 puffs into the lungs every 6 (six) hours as needed for wheezing or shortness of breath. For shortness of breath (Patient taking differently: Inhale 2 puffs into the lungs every 6 (six) hours as needed for wheezing or shortness of breath.)   albuterol (PROVENTIL) (2.5 MG/3ML) 0.083% nebulizer solution Take 2.5 mg by nebulization every 6 (six) hours as needed for wheezing or shortness of breath.    amLODipine (NORVASC) 10 MG tablet Take 20 mg by mouth daily.   atorvastatin (LIPITOR) 20 MG tablet Take 20 mg by mouth daily.   Coenzyme Q10 (CO Q 10) 100 MG CAPS Take 100 mg by mouth daily.   diltiazem (CARDIZEM CD) 120 MG 24 hr capsule Take 120 mg by mouth daily. Take with 240 mg for a total of 360 mg   diltiazem (CARDIZEM LA) 240 MG 24 hr tablet Take 240 mg by mouth daily. Take with 120 mg for a total of 360 mg   furosemide (LASIX) 20 MG tablet Take 20 mg by mouth daily.   lisinopril (PRINIVIL,ZESTRIL) 20 MG tablet Take 1 tablet (20 mg total) by mouth daily. (Patient taking differently: Take 40 mg by mouth daily.)   nicotine (NICODERM CQ - DOSED IN MG/24 HOURS) 21 mg/24hr patch Place 21 mg onto the skin daily.   oxyCODONE-acetaminophen (PERCOCET) 10-325 MG per tablet Take 1-2 tablets by mouth every 4 (four) hours as needed for pain.    pantoprazole (PROTONIX) 40 MG tablet TAKE ONE TABLET BY MOUTH TWICE A DAY BEFORE A MEAL. (Patient taking differently: Take 40 mg by mouth 2  (two) times daily.)   tiotropium (SPIRIVA) 18 MCG inhalation capsule Place 18 mcg into inhaler and inhale at bedtime.   triamcinolone (KENALOG) 0.1 % Apply 1 application topically 2 (two) times daily.   [DISCONTINUED] amoxicillin (AMOXIL) 500 MG capsule Take 1 capsule (500 mg total) by mouth 3 (three) times daily. (Patient not taking: Reported on 01/08/2021)   No facility-administered encounter medications on file as of 01/08/2021.  :  Review of Systems:  Out of a complete 14 point review of systems, all are reviewed and negative with the exception of these symptoms as listed below:  Review of Systems  Neurological:        Here for 1 year f/u. Reports she is doing well. Is in need of supply re order and would like to discuss possibility of getting a new machine.    Objective:  Neurological Exam  Physical Exam Physical Examination:   Vitals:   01/08/21 0911  BP: 116/73  Pulse: 69    General Examination: The patient is a very pleasant 54 y.o. female in no acute distress. She appears well-developed and well-nourished and well groomed.   HEENT: Normocephalic, pupils are equal, round and reactive to light, extraocular tracking is well-preserved, hearing grossly intact, status post right ear reconstructive surgeries.  Face is otherwise symmetric with normal facial sensation and normal facial animation.  Neck is supple, airway examination reveals mild to moderate mouth dryness, stable findings.  Tongue protrudes centrally and palate elevates symmetrically.   Chest: Clear without crackles but scattered mild wheezing noted.     Heart:   S1+S2+0, regular and normal without murmurs, rubs or gallops noted.   Abdomen: Soft, non-tender and non-distended.   Extremities: There is no pitting edema in the distal lower extremities bilaterally.   Skin: Warm and dry without trophic changes noted.   Musculoskeletal: exam reveals no obvious joint deformities, but mild left knee discomfort noted.      Neurologically: Mental status: The patient is awake, alert and oriented in all 4 spheres. Her immediate and remote memory, attention, language skills and fund of knowledge are appropriate. There is no evidence of aphasia, agnosia, apraxia or anomia. Speech is clear with normal prosody and enunciation. Thought process is linear. Mood is normal and affect is normal. Cranial nerves II - XII are as described above under HEENT exam. Motor exam: Normal bulk, strength and tone is noted. There is no drift, tremor or rebound. Fine motor skills and coordination: grossly intact. Cerebellar testing: No dysmetria or intention tremor. Sensory exam: intact to light touch in the upper and lower extremities. Gait, station and balance: She stands easily. No veering to one side is noted. No leaning to one side is noted. Posture is age-appropriate and stance is narrow based. Gait shows normal stride length and normal pace. No problems turning are noted.               Assessment and Plan:  In summary, [name] is a very pleasant 55 year old female with an underlying medical history of asthma, COPD, hypertension, reflux disease, chronic pain syndrome on chronic narcotic pain medication, anxiety, depression, fibromyalgia, and overweight state, status post traumatic injury to the right ear with status post plastic surgery reconstructive surgeries, who presents for follow-up consultation of her mixed sleep apnea. She has been on BiPAP of initially 14/10 cm, we then increased it to 16/12 centimeters in August 2019.  She has been compliant with treatment and has continued to benefit from it.  She should be eligible for new machine as she has been on treatment since March 2019.  I will write for a new BiPAP machine.  Given that she has continued to benefit from treatment and has been compliant with it and also compliant with appointments, I will try to get her a new BiPAP machine without having to do additional testing at  this time.  She is agreeable to this.  In addition, I will write for new supplies.  She uses a nasal mask successfully.  AHI is at goal, leak on the higher side but she has had no recent issues with air leakage or symptoms from it.  She is motivated to continue with treatment and is advised that she will need 11-monthfollow-up should she start a new machine, otherwise if she continues with her current machine for now, we will have her follow-up routinely in this clinic in 1 year, she can see ADebbora Presto NP again.   I answered all her questions today and she was in agreement. I spent 30 minutes in total face-to-face time and in reviewing records during pre-charting, more than 50% of which was spent in counseling and coordination of care, reviewing test results, reviewing medications and treatment regimen and/or in discussing or reviewing the diagnosis of complex sleep apnea, the prognosis and treatment options. Pertinent laboratory and imaging test results that were available during this visit with the patient were reviewed by me and considered in my medical decision making (see chart for details).

## 2021-01-10 ENCOUNTER — Telehealth: Payer: Self-pay

## 2021-01-10 NOTE — Telephone Encounter (Signed)
Order for CPAP were faxed on 01/08/21 to [employer], confirmation of fax received.

## 2021-03-15 ENCOUNTER — Ambulatory Visit: Payer: Medicaid Other | Admitting: Neurology

## 2021-04-09 ENCOUNTER — Encounter: Payer: Self-pay | Admitting: Neurology

## 2021-04-09 NOTE — Progress Notes (Signed)
PATIENT: [name] DOB: September 09, 1965  REASON FOR VISIT: follow up HISTORY FROM: patient  Virtual Visit via Telephone Note  I connected with [name] on 04/09/21 at  9:00 AM EDT by telephone and verified that I am speaking with the correct person using two identifiers.   I discussed the limitations, risks, security and privacy concerns of performing an evaluation and management service by telephone and the availability of in person appointments. I also discussed with the patient that there may be a patient responsible charge related to this service. The patient expressed understanding and agreed to proceed.   History of Present Illness:  04/09/21 ALL: [name] is a 55 y.o. 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 She was seen by Dr Rexene Alberts 12/2020 and new BiPAP was ordered. She is doing very well with new machine. She denies concerns with machine or supplies. She is sleeping well. She notes significant benefit of using BiPAP. She is feeling well and without concerns, today.     History (copied from Dr Guadelupe Sabin previous note)  [name] is a 56 year old right-handed woman with an underlying medical history of asthma, COPD, hypertension, reflux disease, chronic pain on chronic narcotic pain medication, Hx of ear avulsion and s/p surgery, anxiety, depression, fibromyalgia, and overweight state, who presents for follow-up consultation of her sleep apnea, on treatment with BiPAP.  The patient is unaccompanied today.  I last saw her on 02/23/2018, at which time she was tolerating her BiPAP.  She was using a nasal mask.  She had plastic surgery for her right ear.  I increased her pressure from 14/10 to 16/12 at the time due to an AHI of around 16/h.   She saw Christine Rubin, NP, in the interim on 07/21/18.    She saw [name], nurse practitioner in the interim on 10/21/2018 and on 12/02/2018.   She saw Christine Presto, NP on 02/17/2019, at which time her  AHI was elevated around 17/h.  She saw Christine Presto, NP on 11/03/2019, at which time her AHI was improved at 8.9/h.   Today, 01/08/2021: I reviewed her BiPAP compliance data from 12/05/2020 through 01/03/2021, which is a total of 30 days, during which time she used her machine 29 days with percent use days greater than 4 hours at 97%, indicating excellent compliance with an average usage of 8 hours and 1 minute, residual AHI at goal at 2.1/h, leak on the high side with a 95th percentile at 31.9 L/min on a pressure of 16/12 centimeters.  She reports doing well with her BiPAP, she continues to use it faithfully and benefits from it.  She has had recent stressors in the past year, has been taking care of her mom who had a stroke.  She stays with her mom and alternates care with her sister.  She has had allergy flareup since her daughter has a Tour manager.  She has had some wheezing.  She also had a recent ear infection on the left.  She needs new supplies.  She would like to be able to get a new machine as she should be eligible.   Observations/Objective:  Generalized: Well developed, in no acute distress  Mentation: Alert oriented to time, place, history taking. Follows all commands speech and language fluent   Assessment and Plan:  55 y.o. year old female  has a past medical history of Anxiety, Anxiety disorder, Asthma, Chronic pain syndrome, COPD (chronic obstructive pulmonary disease) ([place]),  Depression, Epigastric pain (03/29/2013), Fatigue (03/29/2013), Fibromyalgia, GERD (gastroesophageal reflux disease), Hypertension, Shortness of breath dyspnea, and Sleep apnea. here with    ICD-10-CM   1. Sleep apnea treated with nocturnal BiPAP  G47.30     2. Complex sleep apnea syndrome  G47.31      [name] continues to do well on BiPAP therapy. She is enjoying her new machine Compliance report shows excellent compliance. She was encouraged to continue using BiPAP nightly and for at least 4 hours every night. She will  continue to monitor for elevated leak at home. AHI is well managed. She will continue healthy lifestyle factors. She will follow up with me in 1 year, sooner if needed.    No orders of the defined types were placed in this encounter.   No orders of the defined types were placed in this encounter.    Follow Up Instructions:  I discussed the assessment and treatment plan with the patient. The patient was provided an opportunity to ask questions and all were answered. The patient agreed with the plan and demonstrated an understanding of the instructions.   The patient was advised to call back or seek an in-person evaluation if the symptoms worsen or if the condition fails to improve as anticipated.  I provided 15 minutes of non-face-to-face time during this encounter. Patient located at their place of residence during [place] visit. Provider is in the office.    Christine Presto, NP

## 2021-04-09 NOTE — Patient Instructions (Signed)
Please continue using your BiPAP regularly. While your insurance requires that you use BiPAP at least 4 hours each night on 70% of the nights, I recommend, that you not skip any nights and use it throughout the night if you can. Getting used to BiPAP and staying with the treatment long term does take time and patience and discipline. Untreated obstructive sleep apnea when it is moderate to severe can have an adverse impact on cardiovascular health and raise her risk for heart disease, arrhythmias, hypertension, congestive heart failure, stroke and diabetes. Untreated obstructive sleep apnea causes sleep disruption, nonrestorative sleep, and sleep deprivation. This can have an impact on your day to day functioning and cause daytime sleepiness and impairment of cognitive function, memory loss, mood disturbance, and problems focussing. Using BiPAP regularly can improve these symptoms.   Follow up in 1 year  

## 2021-04-10 ENCOUNTER — Encounter: Payer: Self-pay | Admitting: Family Medicine

## 2021-04-10 ENCOUNTER — Telehealth (INDEPENDENT_AMBULATORY_CARE_PROVIDER_SITE_OTHER): Payer: Medicaid Other | Admitting: Family Medicine

## 2021-04-10 DIAGNOSIS — G4731 Primary central sleep apnea: Secondary | ICD-10-CM

## 2021-04-10 DIAGNOSIS — G4730 Sleep apnea, unspecified: Secondary | ICD-10-CM

## 2021-06-14 ENCOUNTER — Other Ambulatory Visit: Payer: Self-pay

## 2021-06-14 ENCOUNTER — Ambulatory Visit (HOSPITAL_COMMUNITY)
Admission: RE | Admit: 2021-06-14 | Discharge: 2021-06-14 | Disposition: A | Discharge status: Home / Self Care | Payer: Medicaid Other | Source: Ambulatory Visit | Attending: Internal Medicine | Admitting: Internal Medicine

## 2021-06-14 ENCOUNTER — Other Ambulatory Visit (HOSPITAL_COMMUNITY): Payer: Self-pay | Admitting: Internal Medicine

## 2021-06-14 DIAGNOSIS — Z1231 Encounter for screening mammogram for malignant neoplasm of breast: Secondary | ICD-10-CM

## 2021-06-14 DIAGNOSIS — R0602 Shortness of breath: Secondary | ICD-10-CM | POA: Insufficient documentation

## 2021-12-18 ENCOUNTER — Telehealth: Payer: Self-pay | Admitting: *Deleted

## 2021-12-18 NOTE — Telephone Encounter (Signed)
Mailed signed order for CPAP supplies back to [employer].

## 2022-01-08 ENCOUNTER — Ambulatory Visit: Payer: Medicaid Other | Admitting: Family Medicine

## 2022-01-08 ENCOUNTER — Encounter: Payer: Self-pay | Admitting: Family Medicine

## 2022-01-08 VITALS — BP 123/79 | HR 68 | Ht 64.0 in | Wt 159.0 lb

## 2022-01-08 DIAGNOSIS — G4730 Sleep apnea, unspecified: Secondary | ICD-10-CM

## 2022-01-08 DIAGNOSIS — G4731 Primary central sleep apnea: Secondary | ICD-10-CM | POA: Diagnosis not present

## 2022-05-09 ENCOUNTER — Ambulatory Visit: Payer: Medicaid Other | Admitting: Podiatry

## 2022-05-09 DIAGNOSIS — Q828 Other specified congenital malformations of skin: Secondary | ICD-10-CM | POA: Diagnosis not present

## 2022-05-09 NOTE — Progress Notes (Signed)
  Subjective:  Patient ID: [name], female    DOB: [birth year],  MRN: [id number]  Chief Complaint  Patient presents with   Plantar Warts    L plantar wart    56 y.o. female presents with the above complaint. History confirmed with patient. Patient presenting with pain related to what she thinks is a plantar wart on the left foot.  She has had prior problem at this area and states that the wart was excised and the chemical applied to it in the past approximately 2 to 3 years ago.  Patient does have callus present located at the plantar aspect of the left second metatarsal head causing pain.   Objective:  Physical Exam: warm, good capillary refill nail exam normal nails without lesions DP pulses palpable, PT pulses palpable, and protective sensation intact Left Foot:  Pain with palpation of plantar hyperkeratotic lesion subsecond metatarsal head.  This is a hyperkeratotic lesion with central compact hyperkeratotic core consistent with porokeratosis.  No open wound underlying.  No evidence of disruption of skin lines or pinpoint bleeding upon debridement. Right Foot: No pain or abnormal hyperkeratotic lesions  Assessment:   1. Porokeratosis      Plan:  Patient was evaluated and treated and all questions answered.  #Hyperkeratotic lesions/pre ulcerative calluses present sub 2nd met head left foot with porokeratosis -Discussed with the patient that this lesion does not appear to be a plantar wart but rather a porokeratosis.  Recommend foot lotion daily. All symptomatic hyperkeratoses x 1 separate lesions were safely debrided with a sterile #10 blade to patient's level of comfort without incident. We discussed preventative and palliative care of these lesions including supportive and accommodative shoegear, padding, prefabricated and custom molded accommodative orthoses, use of a pumice stone and lotions/creams daily.   Return if symptoms worsen or fail to improve.         Everitt Amber, DPM [facility]

## 2022-06-14 ENCOUNTER — Ambulatory Visit: Payer: Medicaid Other | Admitting: Podiatry

## 2022-06-14 DIAGNOSIS — M722 Plantar fascial fibromatosis: Secondary | ICD-10-CM | POA: Diagnosis not present

## 2022-06-14 MED ORDER — TRIAMCINOLONE ACETONIDE 10 MG/ML IJ SUSP
10.0000 mg | Freq: Once | INTRAMUSCULAR | Status: AC
  Administered 2022-06-14: 10 mg

## 2022-06-17 NOTE — Progress Notes (Signed)
Subjective:   Patient ID: [name], female   DOB: 56 y.o.   MRN: [id number]   HPI Patient has pain on the plantar aspect of the left foot within the distal fascial band and also lesion that is a part of this   ROS      Objective:  Physical Exam  Neurovascular status intact with patient found to have inflammation of the plantar left fascia with also a lesion in the area with lucent core     Assessment:  Inflammatory distal fasciitis left with small benign skin neoplasm     Plan:  H&P done sterile prep and injected the distal fascia 3 mg dexamethasone Kenalog 5 mg Xylocaine advised on support ice therapy and will be seen back as needed and did do courtesy debridement of the lesion

## 2022-10-03 ENCOUNTER — Telehealth: Payer: Self-pay | Admitting: *Deleted

## 2022-10-03 NOTE — Telephone Encounter (Signed)
Mailed signed orders below to [employer] in pre-stamped envelope they provided.

## 2023-01-02 NOTE — Patient Instructions (Signed)
Please continue using your BiPAP regularly. While your insurance requires that you use BiPAP at least 4 hours each night on 70% of the nights, I recommend, that you not skip any nights and use it throughout the night if you can. Getting used to BiPAP and staying with the treatment long term does take time and patience and discipline. Untreated obstructive sleep apnea when it is moderate to severe can have an adverse impact on cardiovascular health and raise her risk for heart disease, arrhythmias, hypertension, congestive heart failure, stroke and diabetes. Untreated obstructive sleep apnea causes sleep disruption, nonrestorative sleep, and sleep deprivation. This can have an impact on your day to day functioning and cause daytime sleepiness and impairment of cognitive function, memory loss, mood disturbance, and problems focussing. Using BiPAP regularly can improve these symptoms.  We will update supply orders, today.  Follow up in 1 year    

## 2023-01-02 NOTE — Progress Notes (Unsigned)
PATIENT: [name] [name] DOB: [birth year]  REASON FOR VISIT: follow up HISTORY FROM: patient  Virtual Visit via Telephone Note  I connected with [name] [name] on 01/07/23 at  8:30 AM EDT by telephone and verified that I am speaking with the correct person using two identifiers.   I discussed the limitations, risks, security and privacy concerns of performing an evaluation and management service by telephone and the availability of in person appointments. I also discussed with the patient that there may be a patient responsible charge related to this service. The patient expressed understanding and agreed to proceed.   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 She continues to do well. She is using therapy nightly for about 8.5 hours. No concerns with machine or supplies.     01/08/22 ALL:  [name] [name] is a 57 y.o. female here today for follow up for OSA on BiPAP.  She continues to do well. She is using BiPAP nightly for about 9 hours. She denies concerns with machine or supplies. She does report using nasal mask that is replaced every 6 months.    Compliance report reveals she used BiPAP 26/30 days from 12/08/2021-01/06/2022. Four hour usage 24/30 days (87%). Average usage 9 hours. Residual AHI 1.9/hr. Leak of 24.9l/min.  04/09/2021 ALL (Mychart): [name] [name] is a 57 y.o. 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 She was seen by Dr Frances Furbish 12/2020 and new BiPAP was ordered. She is doing very well with new machine. She denies concerns with machine or supplies. She is sleeping well. She notes significant benefit of using BiPAP. She is feeling well and without concerns, today.     History (copied from Dr Teofilo Pod previous note)  [name] [name] is a 57 year old right-handed woman with an underlying medical history of asthma, COPD, hypertension, reflux disease, chronic pain on  chronic narcotic pain medication, Hx of ear avulsion and s/p surgery, anxiety, depression, fibromyalgia, and overweight state, who presents for follow-up consultation of Christine sleep apnea, on treatment with BiPAP.  The patient is unaccompanied today.  I last saw Christine on 02/23/2018, at which time she was tolerating Christine BiPAP.  She was using a nasal mask.  She had plastic surgery for Christine right ear.  I increased Christine pressure from 14/10 to 16/12 at the time due to an AHI of around 16/h.   She saw Darrol Angel, NP, in the interim on 07/21/18.    She saw Shawnie Dapper, nurse practitioner in the interim on 10/21/2018 and on 12/02/2018.   She saw Shawnie Dapper, NP on 02/17/2019, at which time Christine AHI was elevated around 17/h.  She saw Shawnie Dapper, NP on 11/03/2019, at which time Christine AHI was improved at 8.9/h.   Today, 01/08/2021: I reviewed Christine BiPAP compliance data from 12/05/2020 through 01/03/2021, which is a total of 30 days, during which time she used Christine machine 29 days with percent use days greater than 4 hours at 97%, indicating excellent compliance with an average usage of 8 hours and 1 minute, residual AHI at goal at 2.1/h, leak on the high side with a 95th percentile at 31.9 L/min on a pressure of 16/12 centimeters.  She reports doing well with Christine BiPAP, she continues to use it faithfully and benefits from it.  She has had recent stressors in the past year, has been taking care of [name] who had  a stroke.  She stays with [name] and alternates care with [name] [name].  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  She has had some wheezing.  She also had a recent ear infection on the left.  She needs new supplies.  She would like to be able to get a new machine as she should be eligible.   Observations/Objective:  Generalized: Well developed, in no acute distress  Mentation: Alert oriented to time, place, history taking. Follows all commands speech and language fluent   Assessment and Plan:  57 y.o.  year old female  has a past medical history of Anxiety, Anxiety disorder, Asthma, Chronic pain syndrome, COPD (chronic obstructive pulmonary disease) (HCC), Depression, Epigastric pain (03/29/2013), Fatigue (03/29/2013), Fibromyalgia, GERD (gastroesophageal reflux disease), Hypertension, Shortness of breath dyspnea, and Sleep apnea. here with    ICD-10-CM   1. Complex sleep apnea syndrome  G47.31 For home use only DME Bipap    2. Sleep apnea treated with nocturnal BiPAP  G47.30 For home use only DME Bipap      Bryanah continues to do well on BiPAP therapy. Compliance report shows excellent compliance. She was encouraged to continue using BiPAP nightly and for at least 4 hours every night. She will continue to monitor for elevated leak at home. AHI is well managed. She will continue healthy lifestyle factors. She will follow up with me in 1 year, sooner if needed.    Orders Placed This Encounter  Procedures   For home use only DME Bipap    supplies    Order Specific Question:   Length of Need    Answer:   Lifetime    Order Specific Question:   Inspiratory pressure    Answer:   OTHER SEE COMMENTS    Order Specific Question:   Expiratory pressure    Answer:   OTHER SEE COMMENTS     No orders of the defined types were placed in this encounter.     Follow Up Instructions:  I discussed the assessment and treatment plan with the patient. The patient was provided an opportunity to ask questions and all were answered. The patient agreed with the plan and demonstrated an understanding of the instructions.   The patient was advised to call back or seek an in-person evaluation if the symptoms worsen or if the condition fails to improve as anticipated.  I provided 15 minutes of non-face-to-face time during this encounter. Patient located at their place of residence during Mychart visit. Provider is in the office.    Shawnie Dapper, NP

## 2023-01-06 ENCOUNTER — Telehealth: Payer: Self-pay

## 2023-01-06 NOTE — Telephone Encounter (Signed)
Please read MyChart message from today 01/06/2023.

## 2023-01-07 ENCOUNTER — Telehealth (INDEPENDENT_AMBULATORY_CARE_PROVIDER_SITE_OTHER): Payer: Medicaid Other | Admitting: Family Medicine

## 2023-01-07 ENCOUNTER — Encounter: Payer: Self-pay | Admitting: Family Medicine

## 2023-01-07 DIAGNOSIS — G4731 Primary central sleep apnea: Secondary | ICD-10-CM

## 2023-01-07 DIAGNOSIS — G4730 Sleep apnea, unspecified: Secondary | ICD-10-CM | POA: Diagnosis not present

## 2023-02-04 ENCOUNTER — Telehealth: Payer: Self-pay | Admitting: *Deleted

## 2023-02-04 NOTE — Telephone Encounter (Signed)
  Faxed to [place] [phone].

## 2023-07-07 ENCOUNTER — Encounter (INDEPENDENT_AMBULATORY_CARE_PROVIDER_SITE_OTHER): Payer: Self-pay

## 2023-07-07 ENCOUNTER — Ambulatory Visit (HOSPITAL_COMMUNITY)
Admission: RE | Admit: 2023-07-07 | Discharge: 2023-07-07 | Disposition: A | Discharge status: Home / Self Care | Payer: Medicaid Other | Source: Ambulatory Visit | Attending: Gastroenterology | Admitting: Gastroenterology

## 2023-07-07 ENCOUNTER — Ambulatory Visit (INDEPENDENT_AMBULATORY_CARE_PROVIDER_SITE_OTHER): Payer: Medicaid Other | Admitting: Gastroenterology

## 2023-07-07 VITALS — BP 124/57 | HR 79 | Temp 98.3°F | Ht 64.0 in | Wt 156.9 lb

## 2023-07-07 DIAGNOSIS — R1010 Upper abdominal pain, unspecified: Secondary | ICD-10-CM

## 2023-07-07 DIAGNOSIS — R1013 Epigastric pain: Secondary | ICD-10-CM | POA: Diagnosis not present

## 2023-07-07 DIAGNOSIS — Z8711 Personal history of peptic ulcer disease: Secondary | ICD-10-CM

## 2023-07-07 DIAGNOSIS — R112 Nausea with vomiting, unspecified: Secondary | ICD-10-CM

## 2023-07-07 MED ORDER — IOHEXOL 300 MG/ML  SOLN
30.0000 mL | Freq: Once | INTRAMUSCULAR | Status: AC | PRN
  Administered 2023-07-07: 30 mL via ORAL

## 2023-07-07 MED ORDER — IOHEXOL 300 MG/ML  SOLN
100.0000 mL | Freq: Once | INTRAMUSCULAR | Status: AC | PRN
  Administered 2023-07-07: 100 mL via INTRAVENOUS

## 2023-07-07 MED ORDER — DICYCLOMINE HCL 10 MG PO CAPS: 10.0000 mg | ORAL_CAPSULE | Freq: Three times a day (TID) | ORAL | 0 refills | Status: DC | PRN

## 2023-07-07 NOTE — H&P (View-Only) (Signed)
 Referring Provider: [facility] Primary Care Physician:  [facility] Primary GI Physician: Dr. Levon Hedger   Chief Complaint  Patient presents with   Abdominal Pain    Mid abdominal pain that is constant but worse in the mornings. Eating makes it worse. It helps when she drinks something cold. Has vomiting every morning.    HPI:   [name] is a 57 y.o. female with past medical history of COPD, FBM, OSA, GERD, ashtma , HTN,HLD depression   Patient presenting today for abdominal pain, nausea and vomiting   Patient reports epigastric pain for the past month. Notes she wakes up with nausea and vomiting. Can eat some though eating makes pain worse. Denies constipation, diarrhea, rectal bleeding or melena. Denies any NSAID use. Currently on protonix 40mg  BID, denies GERD symptoms at baseline. She notes that symptoms came on suddenly. No radiation of pain. Drinks 6 beers every 2 days, denies liquor.    Tobacco:  1/2 PPD   Last Colonoscopy:2022 - One 12 mm polyp in the descending colon, removed                            with a hot snare. Resected and retrieved.                           - One 6 mm polyp in the descending colon, removed                            with a cold snare. Resected and retrieved.                           - The distal rectum and anal verge are normal on                            retroflexion view.  Last Endoscopy:2014Small sliding hiatal hernia without ring or stricture formation. 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  Recommendations:    Past Medical History:  Diagnosis Date   Anxiety    Anxiety disorder    Asthma    Chronic pain syndrome    COPD (chronic obstructive pulmonary disease) (HCC)    Depression    Epigastric pain 03/29/2013   Fatigue 03/29/2013   Fibromyalgia    GERD (gastroesophageal reflux disease)     Hypertension    Shortness of breath dyspnea    Sleep apnea    not using Bipap right now due to ear wound    Past Surgical History:  Procedure Laterality Date   BALLOON DILATION N/A 12/31/2013   Procedure: BALLOON DILATION;  Surgeon: Malissa Hippo, MD;  [facility];  Service: Endoscopy;  Laterality: N/A;   CARPAL TUNNEL RELEASE     CARPAL TUNNEL RELEASE  07/23/2012   Procedure: CARPAL TUNNEL RELEASE;  Surgeon: Darreld Mclean, MD;  Location: [facility];  Service: Orthopedics;  Laterality: Left;   CESAREAN SECTION     CHOLECYSTECTOMY     COLONOSCOPY WITH PROPOFOL N/A 09/27/2020   Procedure: COLONOSCOPY WITH PROPOFOL;  Surgeon: Dolores Frame, MD;  [facility]: [facility];  Service: Gastroenterology;  Laterality: N/A;  PM   cyst removed Left  had surgery to removed cyst   ENDOMETRIAL ABLATION     ESOPHAGOGASTRODUODENOSCOPY  07/31/2011   Procedure: ESOPHAGOGASTRODUODENOSCOPY (EGD);  Surgeon: Malissa Hippo, MD;  [facility];  Service: Endoscopy;  Laterality: N/A;   ESOPHAGOGASTRODUODENOSCOPY N/A 12/31/2013   Procedure: ESOPHAGOGASTRODUODENOSCOPY (EGD);  Surgeon: Malissa Hippo, MD;  [facility];  Service: Endoscopy;  Laterality: N/A;  120-moved to [address] to notify pt   ESOPHAGOGASTRODUODENOSCOPY (EGD) WITH ESOPHAGEAL DILATION N/A 09/30/2012   Procedure: ESOPHAGOGASTRODUODENOSCOPY (EGD) WITH ESOPHAGEAL DILATION;  Surgeon: Malissa Hippo, MD;  [facility];  Service: Endoscopy;  Laterality: N/A;  245   KNEE SURGERY Left    MALONEY DILATION  07/31/2011   Procedure: MALONEY DILATION;  Surgeon: Malissa Hippo, MD;  [facility];  Service: Endoscopy;;   MALONEY DILATION N/A 12/31/2013   Procedure: Elease Hashimoto DILATION;  Surgeon: Malissa Hippo, MD;  [facility];  Service: Endoscopy;  Laterality: N/A;   MASS EXCISION  05/20/2012   Procedure: EXCISION MASS;  Surgeon: Darreld Mclean, MD;  Location: [facility];  Service:  Orthopedics;  Laterality: Left;  Repair of Fascial Defect Left Knee   OTOPLASATY Right 09/09/2017   Procedure: POST AURICULAR FLAP TO RIGHT EAR CARTILAGE GRAFT TO RIGHT EAR FROM RIGHT  EAR;  Surgeon: Glenna Fellows, MD;  Location: [facility];  Service: Plastics;  Laterality: Right;   OTOPLASATY Right 10/03/2017   Procedure: ADJACENT TISSUE TRANSFER RIGHT EAR, CARTILAGE GRAFT FROM LEFT TO RIGHT EAR, FULL THICKNESS SKIN GRAFT FROM LEFT POSTAURICULAR SULCUS TO RIGHT EAR;  Surgeon: Glenna Fellows, MD;  Location: [facility];  Service: Plastics;  Laterality: Right;   POLYPECTOMY  09/27/2020   Procedure: POLYPECTOMY INTESTINAL;  Surgeon: Dolores Frame, MD;  [facility];  Service: Gastroenterology;;   Gaspar Bidding DILATION N/A 12/31/2013   Procedure: Gaspar Bidding DILATION;  Surgeon: Malissa Hippo, MD;  [facility];  Service: Endoscopy;  Laterality: N/A;   SPINE SURGERY      Current Outpatient Medications  Medication Sig Dispense Refill   albuterol (PROVENTIL HFA;VENTOLIN HFA) 108 (90 BASE) MCG/ACT inhaler Inhale 2 puffs into the lungs every 6 (six) hours as needed for wheezing or shortness of breath. For shortness of breath (Patient taking differently: Inhale 2 puffs into the lungs every 6 (six) hours as needed for wheezing or shortness of breath.) 1 Inhaler 1   albuterol (PROVENTIL) (2.5 MG/3ML) 0.083% nebulizer solution Take 2.5 mg by nebulization every 6 (six) hours as needed for wheezing or shortness of breath.      amLODipine (NORVASC) 10 MG tablet Take 20 mg by mouth daily.     atorvastatin (LIPITOR) 20 MG tablet Take 20 mg by mouth daily.     Coenzyme Q10 (CO Q 10) 100 MG CAPS Take 100 mg by mouth daily.     diltiazem (CARDIZEM CD) 120 MG 24 hr capsule Take 120 mg by mouth daily. Take with 240 mg for a total of 360 mg     diltiazem (CARDIZEM LA) 240 MG 24 hr tablet Take 240 mg by mouth daily. Take with 120 mg for a total of 360 mg      furosemide (LASIX) 20 MG tablet Take 20 mg by mouth daily.     lisinopril (PRINIVIL,ZESTRIL) 20 MG tablet Take 1 tablet (20 mg total) by mouth daily. (Patient taking differently: Take 40 mg by mouth daily.) 30 tablet 0   oxyCODONE-acetaminophen (PERCOCET) 10-325 MG per  tablet Take 1-2 tablets by mouth every 4 (four) hours as needed for pain.      triamcinolone (KENALOG) 0.1 % Apply 1 application topically 2 (two) times daily.     pantoprazole (PROTONIX) 40 MG tablet TAKE ONE TABLET BY MOUTH TWICE A DAY BEFORE A MEAL. (Patient taking differently: Take 40 mg by mouth 2 (two) times daily.) 60 tablet 2   tiotropium (SPIRIVA) 18 MCG inhalation capsule Place 18 mcg into inhaler and inhale at bedtime. (Patient not taking: Reported on 07/07/2023)     No current facility-administered medications for this visit.    Allergies as of 07/07/2023   (No Known Allergies)    Family History  Problem Relation Age of Onset   Asthma Mother    Asthma Father    Cancer Father     Social History   Socioeconomic History   Marital status: Single    Spouse name: Not on file   Number of children: Not on file   Years of education: Not on file   Highest education level: Not on file  Occupational History   Occupation: N/A  Tobacco Use   Smoking status: Former    Current packs/day: 0.00    Types: Cigarettes    Quit date: 10/26/2011    Years since quitting: 11.7   Smokeless tobacco: Never  Vaping Use   Vaping status: Never Used  Substance and Sexual Activity   Alcohol use: Yes    Alcohol/week: 2.0 standard drinks of alcohol    Types: 2 Cans of beer per week    Comment: social   Drug use: No   Sexual activity: Yes    Birth control/protection: None, Surgical    Comment: ablation  Other Topics Concern   Not on file  Social History Narrative   Denies any caffeine use    Social Drivers of Corporate investment banker Strain: Not on file  Food Insecurity: Not on file  Transportation Needs: Not on file   Physical Activity: Not on file  Stress: Not on file  Social Connections: Not on file    Review of systems General: negative for malaise, night sweats, fever, chills, weight loss Neck: Negative for lumps, goiter, pain and significant neck swelling Resp: Negative for cough, wheezing, dyspnea at rest CV: Negative for chest pain, leg swelling, palpitations, orthopnea GI: denies melena, hematochezia, diarrhea, constipation, dysphagia, odyonophagia, early satiety or unintentional weight loss. +abdominal pain +nausea  +vomiting  MSK: Negative for joint pain or swelling, back pain, and muscle pain. Derm: Negative for itching or rash Psych: Denies depression, anxiety, memory loss, confusion. No homicidal or suicidal ideation.  Heme: Negative for prolonged bleeding, bruising easily, and swollen nodes. Endocrine: Negative for cold or heat intolerance, polyuria, polydipsia and goiter. Neuro: negative for tremor, gait imbalance, syncope and seizures. The remainder of the review of systems is noncontributory.  Physical Exam: BP (!) 124/57 (BP Location: Left Arm, Patient Position: Sitting, Cuff Size: Normal)   Pulse 79   Temp 98.3 F (36.8 C) (Oral)   Ht 5\' 4"  (1.626 m)   Wt 156 lb 14.4 oz (71.2 kg)   BMI 26.93 kg/m  General:   Alert and oriented. No distress noted. Pleasant and cooperative.  Head:  Normocephalic and atraumatic. Eyes:  Conjuctiva clear without scleral icterus. Mouth:  Oral mucosa pink and moist. Good dentition. No lesions. Heart: Normal rate and rhythm, s1 and s2 heart sounds present.  Lungs: Clear lung sounds in all lobes. Respirations equal and unlabored. Abdomen:  +  BS, soft, and non-distended. Abdomen quite tender to epigastric region.  No rebound or guarding. No HSM or masses noted. Derm: No palmar erythema or jaundice Msk:  Symmetrical without gross deformities. Normal posture. Extremities:  Without edema. Neurologic:  Alert and  oriented x4 Psych:  Alert and  cooperative. Normal mood and affect.  Invalid input(s): "6 MONTHS"   ASSESSMENT: [name] is a 57 y.o. female presenting today for sudden onset of abdominal pain, nausea and vomiting  Patient with sudden onset of epigastric pain, nausea, vomiting. Upper abdomen is quite tender on exam  today. She denies any radiation of pain.  Has history of PUD though is on PPI twice daily denies any NSAID use.  She does drink alcohol regularly at least 3 beers per day.  Differentials include PUD, gastritis, duodenitis, pancreatitis.  At this time given she is on PPI twice daily, using no NSAIDs and on no real offending meds would lean more towards diagnosis of pancreatitis given her alcohol intake.  Will obtain CT abdomen pelvis stat today check CBC, CMP, lipase.  For now she should do liquid diet, avoid all alcohol and I will send Bentyl 10 mg to use 3 times daily as needed.  If her symptoms worsen or she is unable to tolerate liquids she will need to proceed to the ER for further evaluation.  If CT is unremarkable will need to consider upper endoscopy for further evaluation of her symptoms.   PLAN:  CT A/P with contrast STAT 2.  CBC, CMP, Lipase  3. Liquid diet 4. Avoid all ETOH 5. Bentyl 10mg  TID PRN 6. EGD if CT unremarkable   All questions were answered, patient verbalized understanding and is in agreement with plan as outlined above.    Follow Up: 1 month   Krystl Wickware L. Jeanmarie Hubert, MSN, APRN, AGNP-C Adult-Gerontology Nurse Practitioner [facility]  I have reviewed the note and agree with the APP's assessment as described in this progress note  Katrinka Blazing, MD Gastroenterology and Hepatology [facility]

## 2023-07-07 NOTE — Patient Instructions (Addendum)
I am going to order a CT of your abdomen to rule out pancreatitis which is inflammation of your pancreas Please stay on a liquid diet and avoid all alcohol I will also check some blood work today If the CT does not show pancreatitis, we will consider scheduling you for an upper endoscopy for further evaluation of your pain I have sent bentyl to take 10mg  up to three times per day for pain   Follow up 1 month

## 2023-07-07 NOTE — Progress Notes (Addendum)
Referring Provider: [facility] Primary Care Physician:  [facility] Primary GI Physician: Dr. Levon Hedger   Chief Complaint  Patient presents with   Abdominal Pain    Mid abdominal pain that is constant but worse in the mornings. Eating makes it worse. It helps when she drinks something cold. Has vomiting every morning.    HPI:   [name] is a 57 y.o. female with past medical history of COPD, FBM, OSA, GERD, ashtma , HTN,HLD depression   Patient presenting today for abdominal pain, nausea and vomiting   Patient reports epigastric pain for the past month. Notes she wakes up with nausea and vomiting. Can eat some though eating makes pain worse. Denies constipation, diarrhea, rectal bleeding or melena. Denies any NSAID use. Currently on protonix 40mg  BID, denies GERD symptoms at baseline. She notes that symptoms came on suddenly. No radiation of pain. Drinks 6 beers every 2 days, denies liquor.    Tobacco:  1/2 PPD   Last Colonoscopy:2022 - One 12 mm polyp in the descending colon, removed                            with a hot snare. Resected and retrieved.                           - One 6 mm polyp in the descending colon, removed                            with a cold snare. Resected and retrieved.                           - The distal rectum and anal verge are normal on                            retroflexion view.  Last Endoscopy:2014Small sliding hiatal hernia without ring or stricture formation. 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  Recommendations:    Past Medical History:  Diagnosis Date   Anxiety    Anxiety disorder    Asthma    Chronic pain syndrome    COPD (chronic obstructive pulmonary disease) (HCC)    Depression    Epigastric pain 03/29/2013   Fatigue 03/29/2013   Fibromyalgia    GERD (gastroesophageal reflux disease)     Hypertension    Shortness of breath dyspnea    Sleep apnea    not using Bipap right now due to ear wound    Past Surgical History:  Procedure Laterality Date   BALLOON DILATION N/A 12/31/2013   Procedure: BALLOON DILATION;  Surgeon: Malissa Hippo, MD;  [facility]: [facility];  Service: Endoscopy;  Laterality: N/A;   CARPAL TUNNEL RELEASE     CARPAL TUNNEL RELEASE  07/23/2012   Procedure: CARPAL TUNNEL RELEASE;  Surgeon: Darreld Mclean, MD;  Location: [facility];  Service: Orthopedics;  Laterality: Left;   CESAREAN SECTION     CHOLECYSTECTOMY     COLONOSCOPY WITH PROPOFOL N/A 09/27/2020   Procedure: COLONOSCOPY WITH PROPOFOL;  Surgeon: Dolores Frame, MD;  [facility]: [facility];  Service: Gastroenterology;  Laterality: N/A;  PM   cyst removed Left  had surgery to removed cyst   ENDOMETRIAL ABLATION     ESOPHAGOGASTRODUODENOSCOPY  07/31/2011   Procedure: ESOPHAGOGASTRODUODENOSCOPY (EGD);  Surgeon: Malissa Hippo, MD;  [facility]: [facility];  Service: Endoscopy;  Laterality: N/A;   ESOPHAGOGASTRODUODENOSCOPY N/A 12/31/2013   Procedure: ESOPHAGOGASTRODUODENOSCOPY (EGD);  Surgeon: Malissa Hippo, MD;  [facility]: [facility];  Service: Endoscopy;  Laterality: N/A;  120-moved to 925 Ann to notify pt   ESOPHAGOGASTRODUODENOSCOPY (EGD) WITH ESOPHAGEAL DILATION N/A 09/30/2012   Procedure: ESOPHAGOGASTRODUODENOSCOPY (EGD) WITH ESOPHAGEAL DILATION;  Surgeon: Malissa Hippo, MD;  [facility]: [facility];  Service: Endoscopy;  Laterality: N/A;  245   KNEE SURGERY Left    MALONEY DILATION  07/31/2011   Procedure: MALONEY DILATION;  Surgeon: Malissa Hippo, MD;  [facility]: [facility];  Service: Endoscopy;;   MALONEY DILATION N/A 12/31/2013   Procedure: Elease Hashimoto DILATION;  Surgeon: Malissa Hippo, MD;  [facility]: [facility];  Service: Endoscopy;  Laterality: N/A;   MASS EXCISION  05/20/2012   Procedure: EXCISION MASS;  Surgeon: Darreld Mclean, MD;  Location: [facility];  Service:  Orthopedics;  Laterality: Left;  Repair of Fascial Defect Left Knee   OTOPLASATY Right 09/09/2017   Procedure: POST AURICULAR FLAP TO RIGHT EAR CARTILAGE GRAFT TO RIGHT EAR FROM RIGHT  EAR;  Surgeon: Glenna Fellows, MD;  Location: [facility];  Service: Plastics;  Laterality: Right;   OTOPLASATY Right 10/03/2017   Procedure: ADJACENT TISSUE TRANSFER RIGHT EAR, CARTILAGE GRAFT FROM LEFT TO RIGHT EAR, FULL THICKNESS SKIN GRAFT FROM LEFT POSTAURICULAR SULCUS TO RIGHT EAR;  Surgeon: Glenna Fellows, MD;  Location: [facility];  Service: Plastics;  Laterality: Right;   POLYPECTOMY  09/27/2020   Procedure: POLYPECTOMY INTESTINAL;  Surgeon: Dolores Frame, MD;  [facility];  Service: Gastroenterology;;   Gaspar Bidding DILATION N/A 12/31/2013   Procedure: Gaspar Bidding DILATION;  Surgeon: Malissa Hippo, MD;  Location: [facility];  Service: Endoscopy;  Laterality: N/A;   SPINE SURGERY      Current Outpatient Medications  Medication Sig Dispense Refill   albuterol (PROVENTIL HFA;VENTOLIN HFA) 108 (90 BASE) MCG/ACT inhaler Inhale 2 puffs into the lungs every 6 (six) hours as needed for wheezing or shortness of breath. For shortness of breath (Patient taking differently: Inhale 2 puffs into the lungs every 6 (six) hours as needed for wheezing or shortness of breath.) 1 Inhaler 1   albuterol (PROVENTIL) (2.5 MG/3ML) 0.083% nebulizer solution Take 2.5 mg by nebulization every 6 (six) hours as needed for wheezing or shortness of breath.      amLODipine (NORVASC) 10 MG tablet Take 20 mg by mouth daily.     atorvastatin (LIPITOR) 20 MG tablet Take 20 mg by mouth daily.     Coenzyme Q10 (CO Q 10) 100 MG CAPS Take 100 mg by mouth daily.     diltiazem (CARDIZEM CD) 120 MG 24 hr capsule Take 120 mg by mouth daily. Take with 240 mg for a total of 360 mg     diltiazem (CARDIZEM LA) 240 MG 24 hr tablet Take 240 mg by mouth daily. Take with 120 mg for a total of 360 mg      furosemide (LASIX) 20 MG tablet Take 20 mg by mouth daily.     lisinopril (PRINIVIL,ZESTRIL) 20 MG tablet Take 1 tablet (20 mg total) by mouth daily. (Patient taking differently: Take 40 mg by mouth daily.) 30 tablet 0   oxyCODONE-acetaminophen (PERCOCET) 10-325 MG per  tablet Take 1-2 tablets by mouth every 4 (four) hours as needed for pain.      triamcinolone (KENALOG) 0.1 % Apply 1 application topically 2 (two) times daily.     pantoprazole (PROTONIX) 40 MG tablet TAKE ONE TABLET BY MOUTH TWICE A DAY BEFORE A MEAL. (Patient taking differently: Take 40 mg by mouth 2 (two) times daily.) 60 tablet 2   tiotropium (SPIRIVA) 18 MCG inhalation capsule Place 18 mcg into inhaler and inhale at bedtime. (Patient not taking: Reported on 07/07/2023)     No current facility-administered medications for this visit.    Allergies as of 07/07/2023   (No Known Allergies)    Family History  Problem Relation Age of Onset   Asthma Mother    Asthma Father    Cancer Father     Social History   Socioeconomic History   Marital status: Single    Spouse name: Not on file   Number of children: Not on file   Years of education: Not on file   Highest education level: Not on file  Occupational History   Occupation: N/A  Tobacco Use   Smoking status: Former    Current packs/day: 0.00    Types: Cigarettes    Quit date: 10/26/2011    Years since quitting: 11.7   Smokeless tobacco: Never  Vaping Use   Vaping status: Never Used  Substance and Sexual Activity   Alcohol use: Yes    Alcohol/week: 2.0 standard drinks of alcohol    Types: 2 Cans of beer per week    Comment: social   Drug use: No   Sexual activity: Yes    Birth control/protection: None, Surgical    Comment: ablation  Other Topics Concern   Not on file  Social History Narrative   Denies any caffeine use    Social Drivers of Corporate investment banker Strain: Not on file  Food Insecurity: Not on file  Transportation Needs: Not on file   Physical Activity: Not on file  Stress: Not on file  Social Connections: Not on file    Review of systems General: negative for malaise, night sweats, fever, chills, weight loss Neck: Negative for lumps, goiter, pain and significant neck swelling Resp: Negative for cough, wheezing, dyspnea at rest CV: Negative for chest pain, leg swelling, palpitations, orthopnea GI: denies melena, hematochezia, diarrhea, constipation, dysphagia, odyonophagia, early satiety or unintentional weight loss. +abdominal pain +nausea  +vomiting  MSK: Negative for joint pain or swelling, back pain, and muscle pain. Derm: Negative for itching or rash Psych: Denies depression, anxiety, memory loss, confusion. No homicidal or suicidal ideation.  Heme: Negative for prolonged bleeding, bruising easily, and swollen nodes. Endocrine: Negative for cold or heat intolerance, polyuria, polydipsia and goiter. Neuro: negative for tremor, gait imbalance, syncope and seizures. The remainder of the review of systems is noncontributory.  Physical Exam: BP (!) 124/57 (BP Location: Left Arm, Patient Position: Sitting, Cuff Size: Normal)   Pulse 79   Temp 98.3 F (36.8 C) (Oral)   Ht 5\' 4"  (1.626 m)   Wt 156 lb 14.4 oz (71.2 kg)   BMI 26.93 kg/m  General:   Alert and oriented. No distress noted. Pleasant and cooperative.  Head:  Normocephalic and atraumatic. Eyes:  Conjuctiva clear without scleral icterus. Mouth:  Oral mucosa pink and moist. Good dentition. No lesions. Heart: Normal rate and rhythm, s1 and s2 heart sounds present.  Lungs: Clear lung sounds in all lobes. Respirations equal and unlabored. Abdomen:  +  BS, soft, and non-distended. Abdomen quite tender to epigastric region.  No rebound or guarding. No HSM or masses noted. Derm: No palmar erythema or jaundice Msk:  Symmetrical without gross deformities. Normal posture. Extremities:  Without edema. Neurologic:  Alert and  oriented x4 Psych:  Alert and  cooperative. Normal mood and affect.  Invalid input(s): "6 MONTHS"   ASSESSMENT: [name] is a 57 y.o. female presenting today for sudden onset of abdominal pain, nausea and vomiting  Patient with sudden onset of epigastric pain, nausea, vomiting. Upper abdomen is quite tender on exam  today. She denies any radiation of pain.  Has history of PUD though is on PPI twice daily denies any NSAID use.  She does drink alcohol regularly at least 3 beers per day.  Differentials include PUD, gastritis, duodenitis, pancreatitis.  At this time given she is on PPI twice daily, using no NSAIDs and on no real offending meds would lean more towards diagnosis of pancreatitis given her alcohol intake.  Will obtain CT abdomen pelvis stat today check CBC, CMP, lipase.  For now she should do liquid diet, avoid all alcohol and I will send Bentyl 10 mg to use 3 times daily as needed.  If her symptoms worsen or she is unable to tolerate liquids she will need to proceed to the ER for further evaluation.  If CT is unremarkable will need to consider upper endoscopy for further evaluation of her symptoms.   PLAN:  CT A/P with contrast STAT 2.  CBC, CMP, Lipase  3. Liquid diet 4. Avoid all ETOH 5. Bentyl 10mg  TID PRN 6. EGD if CT unremarkable   All questions were answered, patient verbalized understanding and is in agreement with plan as outlined above.    Follow Up: 1 month   Krystl Wickware L. Jeanmarie Hubert, MSN, APRN, AGNP-C Adult-Gerontology Nurse Practitioner [facility]  I have reviewed the note and agree with the APP's assessment as described in this progress note  Katrinka Blazing, MD Gastroenterology and Hepatology [facility]

## 2023-07-08 LAB — COMPREHENSIVE METABOLIC PANEL
AG Ratio: 1.2 (calc) (ref 1.0–2.5)
ALT: 22 U/L (ref 6–29)
AST: 44 U/L — ABNORMAL HIGH (ref 10–35)
Albumin: 4.6 g/dL (ref 3.6–5.1)
Alkaline phosphatase (APISO): 183 U/L — ABNORMAL HIGH (ref 37–153)
BUN: 11 mg/dL (ref 7–25)
CO2: 30 mmol/L (ref 20–32)
Calcium: 9.9 mg/dL (ref 8.6–10.4)
Chloride: 101 mmol/L (ref 98–110)
Creat: 0.9 mg/dL (ref 0.50–1.03)
Globulin: 3.8 g/dL — ABNORMAL HIGH (ref 1.9–3.7)
Glucose, Bld: 109 mg/dL — ABNORMAL HIGH (ref 65–99)
Potassium: 4.5 mmol/L (ref 3.5–5.3)
Sodium: 138 mmol/L (ref 135–146)
Total Bilirubin: 0.6 mg/dL (ref 0.2–1.2)
Total Protein: 8.4 g/dL — ABNORMAL HIGH (ref 6.1–8.1)

## 2023-07-08 LAB — CBC
HCT: 45.1 % — ABNORMAL HIGH (ref 35.0–45.0)
Hemoglobin: 15.2 g/dL (ref 11.7–15.5)
MCH: 34.1 pg — ABNORMAL HIGH (ref 27.0–33.0)
MCHC: 33.7 g/dL (ref 32.0–36.0)
MCV: 101.1 fL — ABNORMAL HIGH (ref 80.0–100.0)
MPV: 9.9 fL (ref 7.5–12.5)
Platelets: 274 10*3/uL (ref 140–400)
RBC: 4.46 10*6/uL (ref 3.80–5.10)
RDW: 12.8 % (ref 11.0–15.0)
WBC: 6 10*3/uL (ref 3.8–10.8)

## 2023-07-08 LAB — LIPASE: Lipase: 36 U/L (ref 7–60)

## 2023-07-10 ENCOUNTER — Encounter (INDEPENDENT_AMBULATORY_CARE_PROVIDER_SITE_OTHER): Payer: Self-pay

## 2023-07-12 ENCOUNTER — Other Ambulatory Visit (INDEPENDENT_AMBULATORY_CARE_PROVIDER_SITE_OTHER): Payer: Self-pay | Admitting: Gastroenterology

## 2023-07-12 DIAGNOSIS — R7989 Other specified abnormal findings of blood chemistry: Secondary | ICD-10-CM

## 2023-07-17 ENCOUNTER — Encounter (INDEPENDENT_AMBULATORY_CARE_PROVIDER_SITE_OTHER): Payer: Self-pay

## 2023-07-17 ENCOUNTER — Emergency Department (HOSPITAL_COMMUNITY)
Admission: EM | Admit: 2023-07-17 | Discharge: 2023-07-18 | Disposition: A | Discharge status: Home / Self Care | Payer: Medicaid Other | Attending: Emergency Medicine | Admitting: Emergency Medicine

## 2023-07-17 ENCOUNTER — Encounter (HOSPITAL_COMMUNITY): Payer: Self-pay

## 2023-07-17 ENCOUNTER — Emergency Department (HOSPITAL_COMMUNITY): Payer: Medicaid Other

## 2023-07-17 ENCOUNTER — Other Ambulatory Visit: Payer: Self-pay

## 2023-07-17 DIAGNOSIS — R109 Unspecified abdominal pain: Secondary | ICD-10-CM | POA: Diagnosis present

## 2023-07-17 DIAGNOSIS — Z79899 Other long term (current) drug therapy: Secondary | ICD-10-CM | POA: Insufficient documentation

## 2023-07-17 DIAGNOSIS — I10 Essential (primary) hypertension: Secondary | ICD-10-CM | POA: Diagnosis not present

## 2023-07-17 DIAGNOSIS — R1013 Epigastric pain: Secondary | ICD-10-CM | POA: Diagnosis not present

## 2023-07-17 DIAGNOSIS — J449 Chronic obstructive pulmonary disease, unspecified: Secondary | ICD-10-CM | POA: Insufficient documentation

## 2023-07-17 DIAGNOSIS — E876 Hypokalemia: Secondary | ICD-10-CM | POA: Insufficient documentation

## 2023-07-17 LAB — URINALYSIS, ROUTINE W REFLEX MICROSCOPIC
Bilirubin Urine: NEGATIVE
Glucose, UA: 50 mg/dL — AB
Ketones, ur: NEGATIVE mg/dL
Nitrite: NEGATIVE
Protein, ur: >=300 mg/dL — AB
Specific Gravity, Urine: 1.015 (ref 1.005–1.030)
Squamous Epithelial / HPF: >50 /[HPF] (ref 0–5)
pH: 5 (ref 5.0–8.0)

## 2023-07-17 LAB — COMPREHENSIVE METABOLIC PANEL
ALT: 31 U/L (ref 0–44)
AST: 29 U/L (ref 15–41)
Albumin: 4.4 g/dL (ref 3.5–5.0)
Alkaline Phosphatase: 137 U/L — ABNORMAL HIGH (ref 38–126)
Anion gap: 13 (ref 5–15)
BUN: 10 mg/dL (ref 6–20)
CO2: 24 mmol/L (ref 22–32)
Calcium: 10 mg/dL (ref 8.9–10.3)
Chloride: 97 mmol/L — ABNORMAL LOW (ref 98–111)
Creatinine, Ser: 1.07 mg/dL — ABNORMAL HIGH (ref 0.44–1.00)
GFR, Estimated: >60 mL/min (ref >60)
Glucose, Bld: 115 mg/dL — ABNORMAL HIGH (ref 70–99)
Potassium: 3.1 mmol/L — ABNORMAL LOW (ref 3.5–5.1)
Sodium: 134 mmol/L — ABNORMAL LOW (ref 135–145)
Total Bilirubin: 1 mg/dL (ref 0.0–1.2)
Total Protein: 8.9 g/dL — ABNORMAL HIGH (ref 6.5–8.1)

## 2023-07-17 LAB — LIPASE, BLOOD: Lipase: 36 U/L (ref 11–51)

## 2023-07-17 LAB — CBC
HCT: 49.7 % — ABNORMAL HIGH (ref 36.0–46.0)
Hemoglobin: 17.3 g/dL — ABNORMAL HIGH (ref 12.0–15.0)
MCH: 34.5 pg — ABNORMAL HIGH (ref 26.0–34.0)
MCHC: 34.8 g/dL (ref 30.0–36.0)
MCV: 99.2 fL (ref 80.0–100.0)
Platelets: 340 10*3/uL (ref 150–400)
RBC: 5.01 MIL/uL (ref 3.87–5.11)
RDW: 13.2 % (ref 11.5–15.5)
WBC: 9.1 10*3/uL (ref 4.0–10.5)
nRBC: 0 % (ref 0.0–0.2)

## 2023-07-17 MED ORDER — ALUM & MAG HYDROXIDE-SIMETH 200-200-20 MG/5ML PO SUSP
30.0000 mL | Freq: Once | ORAL | Status: AC
  Administered 2023-07-17: 30 mL via ORAL
  Filled 2023-07-17: qty 30

## 2023-07-17 MED ORDER — SODIUM CHLORIDE 0.9 % IV BOLUS
1000.0000 mL | Freq: Once | INTRAVENOUS | Status: AC
  Administered 2023-07-17: 1000 mL via INTRAVENOUS

## 2023-07-17 MED ORDER — ONDANSETRON 4 MG PO TBDP
4.0000 mg | ORAL_TABLET | Freq: Once | ORAL | Status: AC
  Administered 2023-07-17: 4 mg via ORAL
  Filled 2023-07-17: qty 1

## 2023-07-17 MED ORDER — HYDROMORPHONE HCL 1 MG/ML IJ SOLN
1.0000 mg | Freq: Once | INTRAMUSCULAR | Status: AC
  Administered 2023-07-17: 1 mg via INTRAVENOUS
  Filled 2023-07-17: qty 1

## 2023-07-17 MED ORDER — HYDROMORPHONE HCL 1 MG/ML IJ SOLN
0.5000 mg | Freq: Once | INTRAMUSCULAR | Status: AC
  Administered 2023-07-17: 0.5 mg via INTRAVENOUS
  Filled 2023-07-17: qty 0.5

## 2023-07-17 MED ORDER — IOHEXOL 300 MG/ML  SOLN
100.0000 mL | Freq: Once | INTRAMUSCULAR | Status: AC | PRN
  Administered 2023-07-17: 100 mL via INTRAVENOUS

## 2023-07-17 MED ORDER — POTASSIUM CHLORIDE CRYS ER 20 MEQ PO TBCR
40.0000 meq | EXTENDED_RELEASE_TABLET | Freq: Once | ORAL | Status: AC
  Administered 2023-07-17: 40 meq via ORAL
  Filled 2023-07-17: qty 2

## 2023-07-17 MED ORDER — SODIUM CHLORIDE 0.9 % IV SOLN
2.0000 g | Freq: Once | INTRAVENOUS | Status: AC
  Administered 2023-07-17: 2 g via INTRAVENOUS
  Filled 2023-07-17: qty 20

## 2023-07-17 MED ORDER — LIDOCAINE VISCOUS HCL 2 % MT SOLN
15.0000 mL | Freq: Once | OROMUCOSAL | Status: AC
Start: 2023-07-17 — End: 2023-07-17
  Administered 2023-07-17: 15 mL via ORAL
  Filled 2023-07-17: qty 15

## 2023-07-17 NOTE — ED Triage Notes (Signed)
 Pt presents to ED from home C/O abdominal pain, n/v/d X months. Pt reports black stool. Also reports taking Pepto frequently.

## 2023-07-17 NOTE — ED Provider Triage Note (Signed)
 Emergency Medicine Provider Triage Evaluation Note  [name] , a 58 y.o. female  was evaluated in triage.  Pt complains of abdominal pain.  Patient reports upper abdominal pain for the past month.  She states that she was sent by her gastroenterologist to get a CT of her abdomen on 12/23 to rule pancreatitis.  Imaging was negative.  Reports persistent pain to the abdomen since with associated nausea and vomiting.  Pain does not radiate.  Endorses dark stools, or sent from her primary care today to rule out GI bleed.  Review of Systems  Positive: Pain, nausea, vomiting Negative: Fevers, back pain, dysuria  Physical Exam  BP 138/65 (BP Location: Right Arm)   Pulse (!) 110   Temp 98.9 F (37.2 C) (Oral)   Resp 20   Ht 5' 4 (1.626 m)   Wt 70.3 kg   SpO2 95%   BMI 26.61 kg/m  Gen:   Awake, no distress   Resp:  Normal effort  MSK:   Moves extremities without difficulty  Other:  Epigastric and periumbilical tenderness  Medical Decision Making  Medically screening exam initiated at 6:11 PM.  Appropriate orders placed.  [name] was informed that the remainder of the evaluation will be completed by another provider, this initial triage assessment does not replace that evaluation, and the importance of remaining in the ED until their evaluation is complete.   Christine Sluder, PA-C 07/17/23 1815

## 2023-07-17 NOTE — ED Provider Notes (Signed)
 [place] [facility]ERGENCY DEPARTMENT AT [facility] Provider Note   CSN: [id number] Arrival date & time: 07/17/23  1518     History  Chief Complaint  Patient presents with   Abdominal Pain    [name] is a 58 y.o. female.   Abdominal Pain Associated symptoms: nausea and vomiting   Associated symptoms: no chest pain, no chills, no diarrhea, no fever and no shortness of breath         [name] is a 58 y.o. female past medical history of COPD, hypertension, chronic pain syndrome, asthma, COPD, GERD and fibromyalgia who presents to the Emergency Department complaining of continued abdominal pain.  States pain has been present for weeks. Had CT abd/pelvis on 12/23 without cause of patient's symptoms.  She states she is planned for upper endoscopy later this month.  Endorses dark/black appearing stools for several days.  Admits to taking Pepto-Bismol.  States she has had some urinary hesitancy and foul-smelling urine.  Denies any fever or chills.  No flank pain   Home Medications Prior to Admission medications   Medication Sig Start Date End Date Taking? Authorizing Provider  albuterol  (PROVENTIL  HFA;VENTOLIN  HFA) 108 (90 BASE) MCG/ACT inhaler Inhale 2 puffs into the lungs every 6 (six) hours as needed for wheezing or shortness of breath. For shortness of breath Patient taking differently: Inhale 2 puffs into the lungs every 6 (six) hours as needed for wheezing or shortness of breath. 07/31/11   Antoinette Doe, MD  albuterol  (PROVENTIL ) (2.5 MG/3ML) 0.083% nebulizer solution Take 2.5 mg by nebulization every 6 (six) hours as needed for wheezing or shortness of breath.     [provider]  amLODipine  (NORVASC ) 10 MG tablet Take 20 mg by mouth daily.    [provider]  atorvastatin (LIPITOR) 20 MG tablet Take 20 mg by mouth daily.    [provider]  Coenzyme Q10 (CO Q 10) 100 MG CAPS Take 100 mg by mouth daily.    [provider]   dicyclomine  (BENTYL ) 10 MG capsule Take 1 capsule (10 mg total) by mouth 3 (three) times daily as needed for spasms. 07/07/23   Carlan, Chelsea L, NP  diltiazem  (CARDIZEM  CD) 120 MG 24 hr capsule Take 120 mg by mouth daily. Take with 240 mg for a total of 360 mg 09/11/20   [provider]  diltiazem  (CARDIZEM  LA) 240 MG 24 hr tablet Take 240 mg by mouth daily. Take with 120 mg for a total of 360 mg    [provider]  furosemide (LASIX) 20 MG tablet Take 20 mg by mouth daily.    [provider]  lisinopril  (PRINIVIL ,ZESTRIL ) 20 MG tablet Take 1 tablet (20 mg total) by mouth daily. Patient taking differently: Take 40 mg by mouth daily. 07/31/11   Antoinette Doe, MD  oxyCODONE -acetaminophen  (PERCOCET) 10-325 MG per tablet Take 1-2 tablets by mouth every 4 (four) hours as needed for pain.     [provider]  pantoprazole  (PROTONIX ) 40 MG tablet TAKE ONE TABLET BY MOUTH TWICE A DAY BEFORE A MEAL. Patient taking differently: Take 40 mg by mouth 2 (two) times daily. 11/03/19   Golda Claudis PENNER, MD  tiotropium (SPIRIVA) 18 MCG inhalation capsule Place 18 mcg into inhaler and inhale at bedtime. Patient not taking: Reported on 07/07/2023    [provider]  triamcinolone  (KENALOG ) 0.1 % Apply 1 application topically 2 (two) times daily. 08/09/20   [provider]  Allergies    Patient has no known allergies.    Review of Systems   Review of Systems  Constitutional:  Negative for appetite change, chills and fever.  Respiratory:  Negative for shortness of breath.   Cardiovascular:  Negative for chest pain.  Gastrointestinal:  Positive for abdominal pain, nausea and vomiting. Negative for diarrhea.  Neurological:  Negative for dizziness, weakness and numbness.    Physical Exam Updated Vital Signs BP 138/65 (BP Location: Right Arm)   Pulse (!) 110   Temp 98.9 F (37.2 C) (Oral)   Resp 20   Ht 5' 4 (1.626 m)   Wt 70.3 kg   SpO2 95%    BMI 26.61 kg/m  Physical Exam Vitals and nursing note reviewed.  Constitutional:      General: She is not in acute distress.    Appearance: She is not ill-appearing or toxic-appearing.     Comments: Patient tearful uncomfortable appearing  HENT:     Mouth/Throat:     Mouth: Mucous membranes are moist.     Pharynx: Oropharynx is clear.  Cardiovascular:     Rate and Rhythm: Normal rate and regular rhythm.     Pulses: Normal pulses.  Pulmonary:     Effort: Pulmonary effort is normal.  Abdominal:     General: There is no distension.     Palpations: Abdomen is soft. There is no mass.     Tenderness: There is abdominal tenderness.     Comments: Diffuse tenderness on exam, mostly epigastric.  No guarding or rebound tenderness.  Abdomen is soft  Genitourinary:    Rectum: Guaiac result negative. No tenderness or anal fissure.     Comments: Digital rectal exam performed by me, black appearing stools, heme-negative.  No palpable rectal masses Musculoskeletal:        General: Normal range of motion.     Right lower leg: No edema.     Left lower leg: No edema.  Skin:    General: Skin is warm.     Capillary Refill: Capillary refill takes less than 2 seconds.  Neurological:     General: No focal deficit present.     Mental Status: She is alert.     Sensory: No sensory deficit.     Motor: No weakness.     ED Results / Procedures / Treatments   Labs (all labs ordered are listed, but only abnormal results are displayed) Labs Reviewed  COMPREHENSIVE METABOLIC PANEL - Abnormal; Notable for the following components:      Result Value   Sodium 134 (*)    Potassium 3.1 (*)    Chloride 97 (*)    Glucose, Bld 115 (*)    Creatinine, Ser 1.07 (*)    Total Protein 8.9 (*)    Alkaline Phosphatase 137 (*)    All other components within normal limits  CBC - Abnormal; Notable for the following components:   Hemoglobin 17.3 (*)    HCT 49.7 (*)    MCH 34.5 (*)    All other components within  normal limits  URINALYSIS, ROUTINE W REFLEX MICROSCOPIC - Abnormal; Notable for the following components:   Color, Urine AMBER (*)    APPearance CLOUDY (*)    Glucose, UA 50 (*)    Hgb urine dipstick SMALL (*)    Protein, ur >=300 (*)    Leukocytes,Ua MODERATE (*)    Bacteria, UA MANY (*)    All other components within normal limits  URINE CULTURE  LIPASE, BLOOD  OCCULT BLOOD X 1 CARD TO LAB, STOOL    EKG None  Radiology CT ABDOMEN PELVIS W CONTRAST Result Date: 07/17/2023 CLINICAL DATA:  Abdominal pain, nausea/vomiting/diarrhea, dark stool EXAM: CT ABDOMEN AND PELVIS WITH CONTRAST TECHNIQUE: Multidetector CT imaging of the abdomen and pelvis was performed using the standard protocol following bolus administration of intravenous contrast. RADIATION DOSE REDUCTION: This exam was performed according to the departmental dose-optimization program which includes automated exposure control, adjustment of the mA and/or kV according to patient size and/or use of iterative reconstruction technique. CONTRAST:  OMNIPAQUE  IOHEXOL  300 MG/ML  SOLN COMPARISON:  07/07/2023 FINDINGS: Lower chest: No acute pleural or parenchymal lung disease. Hepatobiliary: Small hemangioma inferior right lobe liver unchanged. Liver is otherwise unremarkable. Cholecystectomy, with stable postsurgical dilatation of the common bile duct. Pancreas: Unremarkable. No pancreatic ductal dilatation or surrounding inflammatory changes. Spleen: Normal in size without focal abnormality. Adrenals/Urinary Tract: Stable scarring lower pole bilateral kidneys unchanged. Otherwise the kidneys enhance normally and symmetrically. No urinary tract calculi or obstructive uropathy. The adrenals and bladder are unremarkable. Stomach/Bowel: No bowel obstruction or ileus. Normal appendix central abdomen. No bowel wall thickening or inflammatory change. Vascular/Lymphatic: Aortic atherosclerosis. No enlarged abdominal or pelvic lymph nodes.  Reproductive: Stable appearance of the uterus.  No adnexal masses. Other: No free fluid or free intraperitoneal gas. Stable left posterolateral lumbar hernia containing a portion of the splenic flexure of the colon. No incarceration or obstruction. Musculoskeletal: There are no acute or destructive bony abnormalities. 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 Stable postsurgical changes of the thoracolumbar spine. Reconstructed images demonstrate no additional findings. IMPRESSION: 1. No acute intra-abdominal or intrapelvic process. 2. Stable left posterolateral lumbar hernia without incarceration or obstruction. 3.  Aortic Atherosclerosis (ICD10-I70.0). Electronically Signed   By: Ozell Daring M.D.   On: 07/17/2023 22:26    Procedures Procedures    Medications Ordered in ED Medications  sodium chloride  0.9 % bolus 1,000 mL (has no administration in time range)  HYDROmorphone  (DILAUDID ) injection 1 mg (has no administration in time range)  alum & mag hydroxide-simeth (MAALOX/MYLANTA) 200-200-20 MG/5ML suspension 30 mL (30 mLs Oral Given 07/17/23 1818)    And  lidocaine  (XYLOCAINE ) 2 % viscous mouth solution 15 mL (15 mLs Oral Given 07/17/23 1818)  ondansetron  (ZOFRAN -ODT) disintegrating tablet 4 mg (4 mg Oral Given 07/17/23 1817)    ED Course/ Medical Decision Making/ A&P                                 Medical Decision Making Patient here with complaints of diffuse abdominal pain mostly epigastric, pain for some time, worse x 3 to 4 days.  Nausea vomiting intermittently for several days.  Was seen by GI for her symptoms in late December, had CT abdomen and pelvis on 07/07/2023 without specific cause of patient's symptoms.  She states plan is for upper endoscopy later this month.  Here due to continued pain.  On my exam, patient is diffusely tender in her abdomen seems to be mostly epigastric area.  History of prior cholecystectomy.  No vomiting here.   Mucous membranes mildly dry.  Cause of patient's abdominal pain is unclear at this time, differential would include acute surgical abdomen, SBO, viral process, pancreatitis also considered but felt less likely.  No history.  She does endorse some foul-smelling urine possible UTI also considered.  No reported fever or  flank pain but she is vomiting  So pyelonephritis also considered.  Amount and/or Complexity of Data Reviewed Labs: ordered.    Details: Labs, no evidence of leukocytosis, hemoglobin reassuring.  Lipase unremarkable.  Chemistries mild hypokalemia with potassium of 3.1 likely secondary to her vomiting.  Serum creatinine at baseline.  Urinalysis shows cloudy urine with moderate leukocytes 21-50 white cells and many bacteria.  Urine culture pending. Radiology: ordered.    Details: CT abdomen and pelvis repeated due to continued symptoms. No acute findings to suggest source of sx's   Discussion of management or test interpretation with external provider(s): Sourc of pt's abd pain unclear.  Heer work up this evening shows possible pyelonephritis although she does not have flank pain or fever.  This may also be incidental.  She was given Rocephin  here and urine culture pending.  She has recently seen GI and has plan for EGD, but may need sooner rather than later.   Spoke with GI, Dr. Cinderella and will plan close f/u in office for EGD.  Pt has pain medication at home.  Currently on PPI.  Given return precautions  Risk Prescription drug management.           Final Clinical Impression(s) / ED Diagnoses Final diagnoses:  Epigastric pain    Rx / DC Orders ED Discharge Orders     None         Jakiah, Bienaime, PA-C 07/18/23 1518    Geraldene Hamilton, MD 07/18/23 2340

## 2023-07-17 NOTE — ED Notes (Signed)
 Triplett, Dalila, PA-C at bedside to speak with pt regarding results and POC at this time.

## 2023-07-18 NOTE — Discharge Instructions (Signed)
 Continue taking your medication as directed.  Frequent small sips of clear fluids.  Someone from the GI's office will likely be contacting you to arrange for endoscopy.

## 2023-07-20 LAB — URINE CULTURE

## 2023-07-23 ENCOUNTER — Encounter (INDEPENDENT_AMBULATORY_CARE_PROVIDER_SITE_OTHER): Payer: Self-pay | Admitting: Gastroenterology

## 2023-08-01 ENCOUNTER — Encounter (HOSPITAL_COMMUNITY)
Admission: RE | Admit: 2023-08-01 | Discharge: 2023-08-01 | Disposition: A | Discharge status: Home / Self Care | Payer: Medicaid Other | Source: Ambulatory Visit | Attending: Gastroenterology | Admitting: Gastroenterology

## 2023-08-01 ENCOUNTER — Encounter (HOSPITAL_COMMUNITY): Payer: Self-pay

## 2023-08-01 VITALS — BP 127/78 | HR 92 | Temp 98.7°F | Resp 16 | Ht 64.0 in | Wt 155.0 lb

## 2023-08-01 DIAGNOSIS — E876 Hypokalemia: Secondary | ICD-10-CM | POA: Diagnosis not present

## 2023-08-01 DIAGNOSIS — I10 Essential (primary) hypertension: Secondary | ICD-10-CM | POA: Diagnosis not present

## 2023-08-01 DIAGNOSIS — Z01818 Encounter for other preprocedural examination: Secondary | ICD-10-CM | POA: Insufficient documentation

## 2023-08-01 LAB — BASIC METABOLIC PANEL
Anion gap: 8 (ref 5–15)
BUN: 7 mg/dL (ref 6–20)
CO2: 23 mmol/L (ref 22–32)
Calcium: 8.7 mg/dL — ABNORMAL LOW (ref 8.9–10.3)
Chloride: 104 mmol/L (ref 98–111)
Creatinine, Ser: 0.65 mg/dL (ref 0.44–1.00)
GFR, Estimated: >60 mL/min (ref >60)
Glucose, Bld: 118 mg/dL — ABNORMAL HIGH (ref 70–99)
Potassium: 3.3 mmol/L — ABNORMAL LOW (ref 3.5–5.1)
Sodium: 135 mmol/L (ref 135–145)

## 2023-08-05 ENCOUNTER — Ambulatory Visit (HOSPITAL_COMMUNITY): Payer: Self-pay | Admitting: Anesthesiology

## 2023-08-05 ENCOUNTER — Ambulatory Visit (HOSPITAL_COMMUNITY): Payer: Medicaid Other | Admitting: Anesthesiology

## 2023-08-05 ENCOUNTER — Ambulatory Visit (HOSPITAL_COMMUNITY)
Admission: RE | Admit: 2023-08-05 | Discharge: 2023-08-05 | Disposition: A | Discharge status: Home / Self Care | Payer: Medicaid Other | Attending: Gastroenterology | Admitting: Gastroenterology

## 2023-08-05 ENCOUNTER — Encounter (HOSPITAL_COMMUNITY)
Admission: RE | Disposition: A | Discharge status: Home / Self Care | Payer: Self-pay | Source: Home / Self Care | Attending: Gastroenterology

## 2023-08-05 ENCOUNTER — Other Ambulatory Visit: Payer: Self-pay

## 2023-08-05 ENCOUNTER — Encounter (HOSPITAL_COMMUNITY): Payer: Self-pay | Admitting: Gastroenterology

## 2023-08-05 DIAGNOSIS — J4489 Other specified chronic obstructive pulmonary disease: Secondary | ICD-10-CM | POA: Diagnosis not present

## 2023-08-05 DIAGNOSIS — K3189 Other diseases of stomach and duodenum: Secondary | ICD-10-CM | POA: Diagnosis not present

## 2023-08-05 DIAGNOSIS — I10 Essential (primary) hypertension: Secondary | ICD-10-CM | POA: Insufficient documentation

## 2023-08-05 DIAGNOSIS — K449 Diaphragmatic hernia without obstruction or gangrene: Secondary | ICD-10-CM | POA: Insufficient documentation

## 2023-08-05 DIAGNOSIS — K219 Gastro-esophageal reflux disease without esophagitis: Secondary | ICD-10-CM | POA: Insufficient documentation

## 2023-08-05 DIAGNOSIS — Z87891 Personal history of nicotine dependence: Secondary | ICD-10-CM | POA: Diagnosis not present

## 2023-08-05 DIAGNOSIS — Z79899 Other long term (current) drug therapy: Secondary | ICD-10-CM | POA: Diagnosis not present

## 2023-08-05 DIAGNOSIS — K2950 Unspecified chronic gastritis without bleeding: Secondary | ICD-10-CM | POA: Diagnosis not present

## 2023-08-05 DIAGNOSIS — R1084 Generalized abdominal pain: Secondary | ICD-10-CM | POA: Diagnosis present

## 2023-08-05 HISTORY — PX: BIOPSY: SHX5522

## 2023-08-05 HISTORY — PX: ESOPHAGOGASTRODUODENOSCOPY (EGD) WITH PROPOFOL: SHX5813

## 2023-08-05 LAB — HEPATITIS PANEL, ACUTE
Hep A IgM: NONREACTIVE
Hep B C IgM: NONREACTIVE
Hepatitis B Surface Ag: NONREACTIVE
Hepatitis C Ab: NONREACTIVE

## 2023-08-05 LAB — ANA: Anti Nuclear Antibody (ANA): NEGATIVE

## 2023-08-05 LAB — IGG, IGA, IGM
IgG (Immunoglobin G), Serum: 1300 mg/dL (ref 600–1640)
IgM, Serum: 52 mg/dL (ref 50–300)
Immunoglobulin A: 217 mg/dL (ref 47–310)

## 2023-08-05 LAB — IRON, TOTAL/TOTAL IRON BINDING CAP
%SAT: 28 % (ref 16–45)
Iron: 110 ug/dL (ref 45–160)
TIBC: 392 ug/dL (ref 250–450)

## 2023-08-05 LAB — MITOCHONDRIAL ANTIBODIES: Mitochondrial M2 Ab, IgG: <=20 U (ref <=20.0)

## 2023-08-05 LAB — ANTI-SMOOTH MUSCLE ANTIBODY, IGG: Actin (Smooth Muscle) Antibody (IGG): <20 U (ref <20)

## 2023-08-05 LAB — CERULOPLASMIN: Ceruloplasmin: 32 mg/dL (ref 14–48)

## 2023-08-05 LAB — ALPHA-1-ANTITRYPSIN: A-1 Antitrypsin, Ser: 154 mg/dL (ref 83–199)

## 2023-08-05 SURGERY — ESOPHAGOGASTRODUODENOSCOPY (EGD) WITH PROPOFOL
Anesthesia: General

## 2023-08-05 MED ORDER — PROPOFOL 10 MG/ML IV BOLUS
INTRAVENOUS | Status: DC | PRN
  Administered 2023-08-05: 25 mg via INTRAVENOUS
  Administered 2023-08-05: 70 mg via INTRAVENOUS
  Administered 2023-08-05 (×2): 25 mg via INTRAVENOUS
  Administered 2023-08-05: 30 mg via INTRAVENOUS
  Administered 2023-08-05 (×3): 25 mg via INTRAVENOUS

## 2023-08-05 MED ORDER — LIDOCAINE HCL (CARDIAC) PF 100 MG/5ML IV SOSY
PREFILLED_SYRINGE | INTRAVENOUS | Status: DC | PRN
  Administered 2023-08-05: 100 mg via INTRATRACHEAL

## 2023-08-05 MED ORDER — LACTATED RINGERS IV SOLN: INTRAVENOUS | Status: DC

## 2023-08-05 MED ORDER — DICYCLOMINE HCL 10 MG PO CAPS: 10.0000 mg | ORAL_CAPSULE | Freq: Three times a day (TID) | ORAL | 0 refills | Status: AC | PRN

## 2023-08-05 NOTE — Interval H&P Note (Signed)
History and Physical Interval Note:  08/05/2023 9:12 AM  [name]  has presented today for surgery, with the diagnosis of NAUSEA/VOMITING.  The various methods of treatment have been discussed with the patient and family. After consideration of risks, benefits and other options for treatment, the patient has consented to  Procedure(s) with comments: ESOPHAGOGASTRODUODENOSCOPY (EGD) WITH PROPOFOL (N/A) - 10:15AM;ASA 3 as a surgical intervention.  The patient's history has been reviewed, patient examined, no change in status, stable for surgery.  I have reviewed the patient's chart and labs.  Questions were answered to the patient's satisfaction.     Katrinka Blazing Mayorga

## 2023-08-05 NOTE — Op Note (Signed)
[facility] Patient Name: [name] Procedure Date: 08/05/2023 10:23 AM MRN: [id number] Date of Birth: May 30, 1966 Attending MD: Katrinka Blazing , , [id number] CSN: [id number] Age: 58 Admit Type: Outpatient Procedure:                Upper GI endoscopy Indications:              Generalized abdominal pain Providers:                Katrinka Blazing, Crystal Page, Lennice Sites                            Technician, Technician Referring MD:              Medicines:                Monitored Anesthesia Care Complications:            No immediate complications. Estimated Blood Loss:     Estimated blood loss: none. Procedure:                Pre-Anesthesia Assessment:                           - Prior to the procedure, a History and Physical                            was performed, and patient medications, allergies                            and sensitivities were reviewed. The patient's                            tolerance of previous anesthesia was reviewed.                           - The risks and benefits of the procedure and the                            sedation options and risks were discussed with the                            patient. All questions were answered and informed                            consent was obtained.                           - ASA Grade Assessment: II - A patient with mild                            systemic disease.                           After obtaining informed consent, the endoscope was                            passed under direct vision. Throughout the  procedure, the patient's blood pressure, pulse, and                            oxygen saturations were monitored continuously. The                            [id number]-H190 ([id number]) scope was introduced through the                            mouth, and advanced to the operative stoma of                            duodenum. The upper GI endoscopy was accomplished                             without difficulty. The patient tolerated the                            procedure well. Scope In: 10:40:39 AM Scope Out: 10:46:25 AM Total Procedure Duration: 0 hours 5 minutes 46 seconds  Findings:      A 2 cm hiatal hernia was present.      Patchy mildly erythematous mucosa was found in the gastric antrum.       Biopsies were taken with a cold forceps for Helicobacter pylori testing.      The examined duodenum was normal. Biopsies were taken with a cold       forceps for histology. Impression:               - 2 cm hiatal hernia.                           - Erythematous mucosa in the antrum. Biopsied.                           - Normal examined duodenum. Biopsied. Moderate Sedation:      Per Anesthesia Care Recommendation:           - Discharge patient to home (ambulatory).                           - Resume previous diet.                           - Await pathology results.                           - Continue present medications. Procedure Code(s):        --- Professional ---                           [phone], Esophagogastroduodenoscopy, flexible,                            transoral; with biopsy, single or multiple Diagnosis Code(s):        --- Professional ---  K44.9, Diaphragmatic hernia without obstruction or                            gangrene                           K31.89, Other diseases of stomach and duodenum                           R10.84, Generalized abdominal pain CPT copyright 2022 [employer]. All rights reserved. The codes documented in this report are preliminary and upon coder review may  be revised to meet current compliance requirements. Katrinka Blazing, MD Katrinka Blazing,  08/05/2023 10:53:21 AM This report has been signed electronically. Number of Addenda: 0

## 2023-08-05 NOTE — Transfer of Care (Signed)
Immediate Anesthesia Transfer of Care Note  Patient: [name]  Procedure(s) Performed: ESOPHAGOGASTRODUODENOSCOPY (EGD) WITH PROPOFOL BIOPSY  Patient Location: PACU  Anesthesia Type:General  Level of Consciousness: awake, alert , and oriented  Airway & Oxygen Therapy: Patient Spontanous Breathing  Post-op Assessment: Report given to RN and Post -op Vital signs reviewed and stable  Post vital signs: Reviewed and stable  Last Vitals:  Vitals Value Taken Time  BP 117/53 08/05/23 1051  Temp 36.8 C 08/05/23 1051  Pulse 73 08/05/23 1051  Resp 12 08/05/23 1051  SpO2 98 % 08/05/23 1051    Last Pain:  Vitals:   08/05/23 1051  TempSrc: Oral  PainSc: 0-No pain      Patients Stated Pain Goal: 5 (08/05/23 1610)  Complications: No notable events documented.

## 2023-08-05 NOTE — Anesthesia Preprocedure Evaluation (Signed)
Anesthesia Evaluation  Patient identified by MRN, date of birth, ID band Patient awake    Reviewed: Allergy & Precautions, H&P , NPO status , Patient's Chart, lab work & pertinent test results, reviewed documented beta blocker date and time   Airway Mallampati: II  TM Distance: >3 FB Neck ROM: full    Dental no notable dental hx.    Pulmonary neg pulmonary ROS, shortness of breath, asthma , sleep apnea , COPD, former smoker   Pulmonary exam normal breath sounds clear to auscultation       Cardiovascular Exercise Tolerance: Good hypertension, negative cardio ROS  Rhythm:regular Rate:Normal     Neuro/Psych  PSYCHIATRIC DISORDERS Anxiety Depression     Neuromuscular disease negative neurological ROS  negative psych ROS   GI/Hepatic negative GI ROS, Neg liver ROS,GERD  ,,  Endo/Other  negative endocrine ROS    Renal/GU negative Renal ROS  negative genitourinary   Musculoskeletal   Abdominal   Peds  Hematology negative hematology ROS (+)   Anesthesia Other Findings   Reproductive/Obstetrics negative OB ROS                             Anesthesia Physical Anesthesia Plan  ASA: 2  Anesthesia Plan: General   Post-op Pain Management:    Induction:   PONV Risk Score and Plan: Propofol infusion  Airway Management Planned:   Additional Equipment:   Intra-op Plan:   Post-operative Plan:   Informed Consent: I have reviewed the patients History and Physical, chart, labs and discussed the procedure including the risks, benefits and alternatives for the proposed anesthesia with the patient or authorized representative who has indicated his/her understanding and acceptance.     Dental Advisory Given  Plan Discussed with: CRNA  Anesthesia Plan Comments:        Anesthesia Quick Evaluation

## 2023-08-06 LAB — SURGICAL PATHOLOGY

## 2023-08-07 ENCOUNTER — Encounter (HOSPITAL_COMMUNITY): Payer: Self-pay | Admitting: Gastroenterology

## 2023-08-07 ENCOUNTER — Encounter (INDEPENDENT_AMBULATORY_CARE_PROVIDER_SITE_OTHER): Payer: Self-pay | Admitting: *Deleted

## 2023-08-08 NOTE — Anesthesia Postprocedure Evaluation (Signed)
Anesthesia Post Note  Patient: [name]  Procedure(s) Performed: ESOPHAGOGASTRODUODENOSCOPY (EGD) WITH PROPOFOL BIOPSY  Patient location during evaluation: Phase II Anesthesia Type: General Level of consciousness: awake Pain management: pain level controlled Vital Signs Assessment: post-procedure vital signs reviewed and stable Respiratory status: spontaneous breathing and respiratory function stable Cardiovascular status: blood pressure returned to baseline and stable Postop Assessment: no headache and no apparent nausea or vomiting Anesthetic complications: no Comments: Late entry   No notable events documented.   Last Vitals:  Vitals:   08/05/23 0832 08/05/23 1051  BP: 139/75 (!) 117/53  Pulse: 74 73  Resp: 15 12  Temp: 36.8 C 36.8 C  SpO2: 98% 98%    Last Pain:  Vitals:   08/05/23 1051  TempSrc: Oral  PainSc: 0-No pain                 Windell Norfolk

## 2023-08-14 ENCOUNTER — Ambulatory Visit (INDEPENDENT_AMBULATORY_CARE_PROVIDER_SITE_OTHER): Payer: Medicaid Other | Admitting: Gastroenterology

## 2023-08-14 ENCOUNTER — Encounter (INDEPENDENT_AMBULATORY_CARE_PROVIDER_SITE_OTHER): Payer: Self-pay | Admitting: Gastroenterology

## 2023-08-14 VITALS — BP 150/84 | HR 99 | Temp 97.8°F | Ht 64.0 in | Wt 156.9 lb

## 2023-08-14 DIAGNOSIS — K589 Irritable bowel syndrome without diarrhea: Secondary | ICD-10-CM | POA: Insufficient documentation

## 2023-08-14 DIAGNOSIS — R109 Unspecified abdominal pain: Secondary | ICD-10-CM | POA: Diagnosis not present

## 2023-08-14 NOTE — Progress Notes (Signed)
Katrinka Blazing, M.D. Gastroenterology & Hepatology [facility] [address] [place], [place] [address]  Primary Care Physician: Assunta Found, MD [address] [place] [address]  I will communicate my assessment and recommendations to the referring MD via EMR.  Problems: Epigastric pain Mildly elevated LFTs, resolved  History of Present Illness: [name] is a 58 y.o. female with past medical history of COPD, FBM, OSA, GERD, ashtma , HTN,HLD depression who presents for follow up of abdominal pain.  The patient was last seen on 07/07/2023. At that time, the patient underwent a CT of the abdomen and pelvis with IV contrast to evaluate epigastric pain which was within normal limits.  Labs were checked which showed normal lipase of 36, CBC with normal cell's, CMP with mild elevation of AST 44, alkaline phosphatase 183, rest was within normal limits.  Patient was given a prescription of Bentyl as needed.  She was scheduled for EGD with findings described below.  Repeat CMP was performed on 07/17/2023 showing improvement in alkaline phosphatase down to 137, normal ALT 31, AST 29, creatinine 1.07, sodium 134, potassium 3.1.,  ANA, anti-smooth muscle antibodies, iron studies, antimitochondrial antibodies, acute hepatitis panel, and ceruloplasmin levels were normal on 07/31/2023.  Patient reports that she has felt better. She tried drinking mostly soup for a month, but has not felt any pain for the last couple of weeks. The patient denies having any nausea, vomiting, fever, chills, hematochezia, melena, hematemesis, abdominal pain, diarrhea, jaundice, pruritus. Has not used Bentyl recently, but felt some relief in the past. Now is trying to advance her diet as much as possible. Has had some intermittent bloating in her abdomen.  Last EGD: 08/05/2023 2 cm hiatal hernia, gastric erythema (presence of minimal chronic gastritis, negative for H.  pylori), normal duodenum (Normal biopsies). Last Colonoscopy:09/2020 - One 12 mm polyp in the descending colon, removed                            with a hot snare. Resected and retrieved.                           - One 6 mm polyp in the descending colon, removed                            with a cold snare. Resected and retrieved.                           - The distal rectum and anal verge are normal on                            retroflexion view. Pathology showed tubular adenomas x 2.  Recommendation to repeat colonoscopy in 5 years as the largest polyp measured 8 mm.  Past Medical History: Past Medical History:  Diagnosis Date   Anxiety    Anxiety disorder    Asthma    Chronic pain syndrome    COPD (chronic obstructive pulmonary disease) (HCC)    Depression    Epigastric pain 03/29/2013   Fatigue 03/29/2013   Fibromyalgia    GERD (gastroesophageal reflux disease)    Hypertension    Shortness of breath dyspnea    Sleep apnea    not using Bipap  right now due to ear wound    Past Surgical History: Past Surgical History:  Procedure Laterality Date   BALLOON DILATION N/A 12/31/2013   Procedure: BALLOON DILATION;  Surgeon: Malissa Hippo, MD;  [facility];  Service: Endoscopy;  Laterality: N/A;   BIOPSY  08/05/2023   Procedure: BIOPSY;  Surgeon: Dolores Frame, MD;  [facility];  Service: Gastroenterology;;   CARPAL TUNNEL RELEASE     CARPAL TUNNEL RELEASE  07/23/2012   Procedure: CARPAL TUNNEL RELEASE;  Surgeon: Darreld Mclean, MD;  Location: [facility];  Service: Orthopedics;  Laterality: Left;   CESAREAN SECTION     CHOLECYSTECTOMY     COLONOSCOPY WITH PROPOFOL N/A 09/27/2020   Procedure: COLONOSCOPY WITH PROPOFOL;  Surgeon: Dolores Frame, MD;  [facility];  Service: Gastroenterology;  Laterality: N/A;  PM   cyst removed Left    had surgery to removed cyst   ENDOMETRIAL ABLATION     ESOPHAGOGASTRODUODENOSCOPY  07/31/2011    Procedure: ESOPHAGOGASTRODUODENOSCOPY (EGD);  Surgeon: Malissa Hippo, MD;  [facility];  Service: Endoscopy;  Laterality: N/A;   ESOPHAGOGASTRODUODENOSCOPY N/A 12/31/2013   Procedure: ESOPHAGOGASTRODUODENOSCOPY (EGD);  Surgeon: Malissa Hippo, MD;  [facility];  Service: Endoscopy;  Laterality: N/A;  120-moved to 925 Ann to notify pt   ESOPHAGOGASTRODUODENOSCOPY (EGD) WITH ESOPHAGEAL DILATION N/A 09/30/2012   Procedure: ESOPHAGOGASTRODUODENOSCOPY (EGD) WITH ESOPHAGEAL DILATION;  Surgeon: Malissa Hippo, MD;  [facility];  Service: Endoscopy;  Laterality: N/A;  245   ESOPHAGOGASTRODUODENOSCOPY (EGD) WITH PROPOFOL N/A 08/05/2023   Procedure: ESOPHAGOGASTRODUODENOSCOPY (EGD) WITH PROPOFOL;  Surgeon: Dolores Frame, MD;  [facility];  Service: Gastroenterology;  Laterality: N/A;  10:15AM;ASA 3   KNEE SURGERY Left    MALONEY DILATION  07/31/2011   Procedure: MALONEY DILATION;  Surgeon: Malissa Hippo, MD;  [facility];  Service: Endoscopy;;   MALONEY DILATION N/A 12/31/2013   Procedure: Elease Hashimoto DILATION;  Surgeon: Malissa Hippo, MD;  [facility];  Service: Endoscopy;  Laterality: N/A;   MASS EXCISION  05/20/2012   Procedure: EXCISION MASS;  Surgeon: Darreld Mclean, MD;  [facility]: [facility];  Service: Orthopedics;  Laterality: Left;  Repair of Fascial Defect Left Knee   OTOPLASATY Right 09/09/2017   Procedure: POST AURICULAR FLAP TO RIGHT EAR CARTILAGE GRAFT TO RIGHT EAR FROM RIGHT  EAR;  Surgeon: Glenna Fellows, MD;  Location: [facility];  Service: Plastics;  Laterality: Right;   OTOPLASATY Right 10/03/2017   Procedure: ADJACENT TISSUE TRANSFER RIGHT EAR, CARTILAGE GRAFT FROM LEFT TO RIGHT EAR, FULL THICKNESS SKIN GRAFT FROM LEFT POSTAURICULAR SULCUS TO RIGHT EAR;  Surgeon: Glenna Fellows, MD;  Location: [facility];  Service: Plastics;  Laterality: Right;   POLYPECTOMY  09/27/2020    Procedure: POLYPECTOMY INTESTINAL;  Surgeon: Dolores Frame, MD;  [facility];  Service: Gastroenterology;;   Gaspar Bidding DILATION N/A 12/31/2013   Procedure: Gaspar Bidding DILATION;  Surgeon: Malissa Hippo, MD;  [facility];  Service: Endoscopy;  Laterality: N/A;   SPINE SURGERY      Family History: Family History  Problem Relation Age of Onset   Asthma Mother    Asthma Father    Cancer Father     Social History: Social History   Tobacco Use  Smoking Status Every Day   Current packs/day: 0.00   Types: Cigarettes   Last attempt to quit: 10/26/2011  Years since quitting: 11.8  Smokeless Tobacco Never   Social History   Substance and Sexual Activity  Alcohol Use Yes   Alcohol/week: 2.0 standard drinks of alcohol   Types: 2 Cans of beer per week   Comment: social   Social History   Substance and Sexual Activity  Drug Use No    Allergies: No Known Allergies  Medications: Current Outpatient Medications  Medication Sig Dispense Refill   albuterol (PROVENTIL HFA;VENTOLIN HFA) 108 (90 BASE) MCG/ACT inhaler Inhale 2 puffs into the lungs every 6 (six) hours as needed for wheezing or shortness of breath. For shortness of breath (Patient taking differently: Inhale 2 puffs into the lungs every 6 (six) hours as needed for wheezing or shortness of breath.) 1 Inhaler 1   albuterol (PROVENTIL) (2.5 MG/3ML) 0.083% nebulizer solution Take 2.5 mg by nebulization every 6 (six) hours as needed for wheezing or shortness of breath.      amLODipine (NORVASC) 10 MG tablet Take 20 mg by mouth daily.     atorvastatin (LIPITOR) 20 MG tablet Take 20 mg by mouth daily.     Coenzyme Q10 (CO Q 10) 100 MG CAPS Take 100 mg by mouth daily.     dicyclomine (BENTYL) 10 MG capsule Take 1 capsule (10 mg total) by mouth 3 (three) times daily as needed for spasms (abdominal pain). 180 capsule 0   diltiazem (CARDIZEM CD) 120 MG 24 hr capsule Take 120 mg by mouth daily. Take with 240  mg for a total of 360 mg     diltiazem (CARDIZEM LA) 240 MG 24 hr tablet Take 240 mg by mouth daily. Take with 120 mg for a total of 360 mg     furosemide (LASIX) 20 MG tablet Take 20 mg by mouth daily.     lisinopril (PRINIVIL,ZESTRIL) 20 MG tablet Take 1 tablet (20 mg total) by mouth daily. (Patient taking differently: Take 40 mg by mouth daily.) 30 tablet 0   oxyCODONE-acetaminophen (PERCOCET) 10-325 MG per tablet Take 1-2 tablets by mouth every 4 (four) hours as needed for pain.      pantoprazole (PROTONIX) 40 MG tablet TAKE ONE TABLET BY MOUTH TWICE A DAY BEFORE A MEAL. (Patient taking differently: Take 40 mg by mouth 2 (two) times daily.) 60 tablet 2   tiotropium (SPIRIVA) 18 MCG inhalation capsule Place 18 mcg into inhaler and inhale at bedtime.     No current facility-administered medications for this visit.    Review of Systems: GENERAL: negative for malaise, night sweats HEENT: No changes in hearing or vision, no nose bleeds or other nasal problems. NECK: Negative for lumps, goiter, pain and significant neck swelling RESPIRATORY: Negative for cough, wheezing CARDIOVASCULAR: Negative for chest pain, leg swelling, palpitations, orthopnea GI: SEE HPI MUSCULOSKELETAL: Negative for joint pain or swelling, back pain, and muscle pain. SKIN: Negative for lesions, rash PSYCH: Negative for sleep disturbance, mood disorder and recent psychosocial stressors. HEMATOLOGY Negative for prolonged bleeding, bruising easily, and swollen nodes. ENDOCRINE: Negative for cold or heat intolerance, polyuria, polydipsia and goiter. NEURO: negative for tremor, gait imbalance, syncope and seizures. The remainder of the review of systems is noncontributory.   Physical Exam: BP (!) 150/84 (BP Location: Right Arm, Patient Position: Sitting, Cuff Size: Normal)   Pulse 99   Temp 97.8 F (36.6 C) (Temporal)   Ht 5\' 4"  (1.626 m)   Wt 156 lb 14.4 oz (71.2 kg)   LMP 07/16/2007 Comment: abaltion in 2009 no  periods since  BMI 26.93 kg/m  GENERAL: The patient is AO x3, in no acute distress. HEENT: Head is normocephalic and atraumatic. EOMI are intact. Mouth is well hydrated and without lesions. NECK: Supple. No masses LUNGS: Rhonchi bilaterally HEART: RRR, normal s1 and s2. ABDOMEN: Tender to palpation in the epigastric area, no guarding, no peritoneal signs, and nondistended. BS +. No masses. EXTREMITIES: Without any cyanosis, clubbing, rash, lesions or edema. NEUROLOGIC: AOx3, no focal motor deficit. SKIN: no jaundice, no rashes  Imaging/Labs: as above  I personally reviewed and interpreted the available labs, imaging and endoscopic files.  Impression and Plan: ZOEE HEENEY is a 58 y.o. female with past medical history of COPD, FBM, OSA, GERD, ashtma , HTN,HLD depression who presents for follow up of abdominal pain.  Patient has presented improvement of her abdominal pain.  She has undergone cross-sectional abdominal imaging and recent endoscopic evaluations that were unremarkable.  It is possible that part of her symptoms are related to functional etiology, especially exacerbated by specific types of food that she has been avoiding.  I advised her to try to implement a low FODMAP diet as much as possible and advance as tolerated.  Her abdominal pain has significantly improved but she can take Bentyl as needed for now.  -Can take Bentyl as needed for abdominal pain episodes -Patient was counseled about the benefit of implementing a low FODMAP to improve symptoms and recurrent episodes. A dietary list was provided to the patient.  All questions were answered.      Katrinka Blazing, MD Gastroenterology and Hepatology [facility]

## 2023-08-14 NOTE — Patient Instructions (Signed)
Can take Bentyl as needed for abdominal pain episodes Patient was counseled about the benefit of implementing a low FODMAP to improve symptoms and recurrent episodes. A dietary list was provided to the patient.

## 2023-08-15 ENCOUNTER — Ambulatory Visit (INDEPENDENT_AMBULATORY_CARE_PROVIDER_SITE_OTHER): Payer: Medicaid Other | Admitting: Gastroenterology

## 2023-12-30 NOTE — Patient Instructions (Signed)

## 2023-12-30 NOTE — Progress Notes (Signed)
 PATIENT: [name] DOB: 03/29/1966  REASON FOR VISIT: follow up HISTORY FROM: patient  Virtual Visit via Telephone Note  I connected with [name] on 01/05/24 at  9:30 AM EDT by telephone and verified that I am speaking with the correct person using two identifiers.   I discussed the limitations, risks, security and privacy concerns of performing an evaluation and management service by telephone and the availability of in person appointments. I also discussed with the patient that there may be a patient responsible charge related to this service. The patient expressed understanding and agreed to proceed.   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 She continues to do well on therapy. She is using BiPAP most every night for about 7 hours, on average. Se does note significant improvement in sleep quality when using therapy. She denies concerns with machine or supplies. Using nasal mask. Follows with PCP regularly.     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 She continues to do well. She is using therapy nightly for about 8.5 hours. No concerns with machine or supplies.     01/08/22 ALL:  [name] is a 58 y.o. female here today for follow up for OSA on BiPAP.  She continues to do well. She is using BiPAP nightly for about 9 hours. She denies concerns with machine or supplies. She does report using nasal mask that is replaced every 6 months.    Compliance report reveals she used BiPAP 26/30 days from 12/08/2021-01/06/2022. Four hour usage 24/30 days (87%). Average usage 9 hours. Residual AHI 1.9/hr. Leak of 24.9l/min.  04/09/2021 ALL (Mychart): [name] is a 58 y.o. 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 She was seen by Dr Buck 12/2020 and new BiPAP was ordered. She is doing very well with new machine. She  denies concerns with machine or supplies. She is sleeping well. She notes significant benefit of using BiPAP. She is feeling well and without concerns, today.     History (copied from Dr Obie previous note)  [name] is a 58 year old right-handed woman with an underlying medical history of asthma, COPD, hypertension, reflux disease, chronic pain on chronic narcotic pain medication, Hx of ear avulsion and s/p surgery, anxiety, depression, fibromyalgia, and overweight state, who presents for follow-up consultation of her sleep apnea, on treatment with BiPAP.  The patient is unaccompanied today.  I last saw her on 02/23/2018, at which time she was tolerating her BiPAP.  She was using a nasal mask.  She had plastic surgery for her right ear.  I increased her pressure from 14/10 to 16/12 at the time due to an AHI of around 16/h.   She saw Christine Lunger, NP, in the interim on 07/21/18.    She saw [name], nurse practitioner in the interim on 10/21/2018 and on 12/02/2018.   She saw Christine Forbes, NP on 02/17/2019, at which time her AHI was elevated around 17/h.  She saw Christine Forbes, NP on 11/03/2019, at which time her AHI was improved at 8.9/h.   Today, 01/08/2021: I reviewed her BiPAP compliance data from 12/05/2020 through 01/03/2021, which is a total of 30 days, during which time she used her machine 29 days with percent use days greater than 4 hours at 97%, indicating excellent compliance with an average usage of 8 hours  and 1 minute, residual AHI at goal at 2.1/h, leak on the high side with a 95th percentile at 31.9 L/min on a pressure of 16/12 centimeters.  She reports doing well with her BiPAP, she continues to use it faithfully and benefits from it.  She has had recent stressors in the past year, has been taking care of her mom who had a stroke.  She stays with her mom and alternates care with her sister.  She has had allergy flareup since her daughter has a Designer, television/film set.  She has had some wheezing.  She also had a  recent ear infection on the left.  She needs new supplies.  She would like to be able to get a new machine as she should be eligible.   Observations/Objective:  Generalized: Well developed, in no acute distress  Mentation: Alert oriented to time, place, history taking. Follows all commands speech and language fluent   Assessment and Plan:  59 y.o. year old female  has a past medical history of Anxiety, Anxiety disorder, Asthma, Chronic pain syndrome, COPD (chronic obstructive pulmonary disease) (HCC), Depression, Epigastric pain (03/29/2013), Fatigue (03/29/2013), Fibromyalgia, GERD (gastroesophageal reflux disease), Hypertension, Shortness of breath dyspnea, and Sleep apnea. here with    ICD-10-CM   1. Complex sleep apnea syndrome  G47.39 For home use only DME Bipap    2. Sleep apnea treated with nocturnal BiPAP  G47.30 For home use only DME Bipap      [name] continues to do well on BiPAP therapy. Compliance report shows excellent compliance. She was encouraged to continue using BiPAP nightly and for at least 4 hours every night. Leak improved. AHI is well managed. She will continue healthy lifestyle factors. She will follow up with me in 1 year, sooner if needed.    Orders Placed This Encounter  Procedures   For home use only DME Bipap    Supplies    Length of Need:   Lifetime    Inspiratory pressure:   OTHER SEE COMMENTS    Expiratory pressure:   OTHER SEE COMMENTS     No orders of the defined types were placed in this encounter.     Follow Up Instructions:  I discussed the assessment and treatment plan with the patient. The patient was provided an opportunity to ask questions and all were answered. The patient agreed with the plan and demonstrated an understanding of the instructions.   The patient was advised to call back or seek an in-person evaluation if the symptoms worsen or if the condition fails to improve as anticipated.  I provided 15 minutes of non-face-to-face  time during this encounter. Patient located at their place of residence during Mychart visit. Provider is in the office.    Caya Soberanis, NP

## 2024-01-01 NOTE — Progress Notes (Unsigned)
 [name]

## 2024-01-05 ENCOUNTER — Encounter: Payer: Self-pay | Admitting: Family Medicine

## 2024-01-05 ENCOUNTER — Telehealth: Payer: Medicaid Other | Admitting: Family Medicine

## 2024-01-05 DIAGNOSIS — G4739 Other sleep apnea: Secondary | ICD-10-CM

## 2024-01-05 DIAGNOSIS — G4730 Sleep apnea, unspecified: Secondary | ICD-10-CM | POA: Diagnosis not present

## 2024-01-05 NOTE — Progress Notes (Signed)
 Cpap supply order faxed to [facility] [phone].  Confirmation received.

## 2024-04-17 ENCOUNTER — Emergency Department (HOSPITAL_COMMUNITY)
Admission: EM | Admit: 2024-04-17 | Discharge: 2024-04-17 | Disposition: A | Discharge status: Home / Self Care | Source: Home / Self Care

## 2024-04-28 ENCOUNTER — Other Ambulatory Visit: Payer: Self-pay | Admitting: Nurse Practitioner

## 2024-04-28 ENCOUNTER — Encounter (INDEPENDENT_AMBULATORY_CARE_PROVIDER_SITE_OTHER): Payer: Self-pay | Admitting: Gastroenterology

## 2024-04-28 DIAGNOSIS — Z1231 Encounter for screening mammogram for malignant neoplasm of breast: Secondary | ICD-10-CM

## 2024-05-20 ENCOUNTER — Ambulatory Visit

## 2024-06-16 ENCOUNTER — Ambulatory Visit

## 2024-07-13 ENCOUNTER — Encounter (INDEPENDENT_AMBULATORY_CARE_PROVIDER_SITE_OTHER): Payer: Self-pay | Admitting: Gastroenterology

## 2025-01-04 ENCOUNTER — Telehealth: Admitting: Family Medicine
